# Patient Record
Sex: Female | Born: 1955 | Race: Black or African American | Hispanic: No | Marital: Single | Smoking: Current some day smoker
Health system: Southern US, Community
[De-identification: ages and names within clinical notes are randomized; demographics above are authoritative.]

## PROBLEM LIST (undated history)

## (undated) DIAGNOSIS — R51 Headache: Secondary | ICD-10-CM

## (undated) DIAGNOSIS — J9612 Chronic respiratory failure with hypercapnia: Secondary | ICD-10-CM

## (undated) DIAGNOSIS — Z720 Tobacco use: Secondary | ICD-10-CM

## (undated) DIAGNOSIS — Z9981 Dependence on supplemental oxygen: Secondary | ICD-10-CM

## (undated) DIAGNOSIS — F1910 Other psychoactive substance abuse, uncomplicated: Secondary | ICD-10-CM

## (undated) DIAGNOSIS — C3490 Malignant neoplasm of unspecified part of unspecified bronchus or lung: Secondary | ICD-10-CM

## (undated) DIAGNOSIS — F1010 Alcohol abuse, uncomplicated: Secondary | ICD-10-CM

## (undated) DIAGNOSIS — I10 Essential (primary) hypertension: Secondary | ICD-10-CM

## (undated) DIAGNOSIS — Z923 Personal history of irradiation: Secondary | ICD-10-CM

## (undated) DIAGNOSIS — R609 Edema, unspecified: Secondary | ICD-10-CM

## (undated) DIAGNOSIS — J449 Chronic obstructive pulmonary disease, unspecified: Secondary | ICD-10-CM

## (undated) DIAGNOSIS — J45909 Unspecified asthma, uncomplicated: Secondary | ICD-10-CM

## (undated) HISTORY — DX: Chronic obstructive pulmonary disease, unspecified: J44.9

## (undated) HISTORY — PX: NO PAST SURGERIES: SHX2092

## (undated) HISTORY — DX: Personal history of irradiation: Z92.3

## (undated) HISTORY — DX: Tobacco use: Z72.0

## (undated) HISTORY — DX: Essential (primary) hypertension: I10

## (undated) HISTORY — DX: Malignant neoplasm of unspecified part of unspecified bronchus or lung: C34.90

---

## 2000-09-09 ENCOUNTER — Emergency Department (HOSPITAL_COMMUNITY): Admission: EM | Admit: 2000-09-09 | Discharge: 2000-09-09 | Payer: Self-pay | Admitting: Emergency Medicine

## 2000-09-09 ENCOUNTER — Encounter: Payer: Self-pay | Admitting: Emergency Medicine

## 2001-09-15 ENCOUNTER — Inpatient Hospital Stay (HOSPITAL_COMMUNITY): Admission: EM | Admit: 2001-09-15 | Discharge: 2001-09-18 | Payer: Self-pay

## 2001-09-17 ENCOUNTER — Encounter: Payer: Self-pay | Admitting: Internal Medicine

## 2003-10-30 ENCOUNTER — Emergency Department (HOSPITAL_COMMUNITY): Admission: EM | Admit: 2003-10-30 | Discharge: 2003-10-30 | Payer: Self-pay | Admitting: Emergency Medicine

## 2006-04-16 ENCOUNTER — Emergency Department (HOSPITAL_COMMUNITY): Admission: EM | Admit: 2006-04-16 | Discharge: 2006-04-17 | Payer: Self-pay | Admitting: Emergency Medicine

## 2007-07-22 ENCOUNTER — Inpatient Hospital Stay (HOSPITAL_COMMUNITY): Admission: EM | Admit: 2007-07-22 | Discharge: 2007-07-31 | Payer: Self-pay | Admitting: Emergency Medicine

## 2007-07-22 ENCOUNTER — Ambulatory Visit: Payer: Self-pay | Admitting: Pulmonary Disease

## 2007-07-22 ENCOUNTER — Ambulatory Visit: Payer: Self-pay | Admitting: Family Medicine

## 2007-08-08 ENCOUNTER — Telehealth: Payer: Self-pay | Admitting: Family Medicine

## 2007-08-13 ENCOUNTER — Encounter: Payer: Self-pay | Admitting: Family Medicine

## 2007-08-14 ENCOUNTER — Telehealth: Payer: Self-pay | Admitting: *Deleted

## 2007-09-04 ENCOUNTER — Ambulatory Visit: Payer: Self-pay | Admitting: Family Medicine

## 2007-09-04 DIAGNOSIS — F17200 Nicotine dependence, unspecified, uncomplicated: Secondary | ICD-10-CM | POA: Insufficient documentation

## 2007-09-04 DIAGNOSIS — J449 Chronic obstructive pulmonary disease, unspecified: Secondary | ICD-10-CM | POA: Insufficient documentation

## 2007-09-04 DIAGNOSIS — F1020 Alcohol dependence, uncomplicated: Secondary | ICD-10-CM | POA: Insufficient documentation

## 2007-09-04 DIAGNOSIS — Z8709 Personal history of other diseases of the respiratory system: Secondary | ICD-10-CM | POA: Insufficient documentation

## 2007-11-19 ENCOUNTER — Telehealth: Payer: Self-pay | Admitting: Family Medicine

## 2007-11-19 DIAGNOSIS — H547 Unspecified visual loss: Secondary | ICD-10-CM

## 2007-11-23 ENCOUNTER — Telehealth: Payer: Self-pay | Admitting: Family Medicine

## 2007-11-29 ENCOUNTER — Telehealth: Payer: Self-pay | Admitting: Family Medicine

## 2008-02-20 ENCOUNTER — Telehealth: Payer: Self-pay | Admitting: Family Medicine

## 2008-08-28 ENCOUNTER — Emergency Department (HOSPITAL_COMMUNITY): Admission: EM | Admit: 2008-08-28 | Discharge: 2008-08-28 | Payer: Self-pay | Admitting: Emergency Medicine

## 2009-08-24 ENCOUNTER — Inpatient Hospital Stay (HOSPITAL_COMMUNITY): Admission: EM | Admit: 2009-08-24 | Discharge: 2009-08-27 | Payer: Self-pay | Admitting: Emergency Medicine

## 2009-09-02 ENCOUNTER — Telehealth (INDEPENDENT_AMBULATORY_CARE_PROVIDER_SITE_OTHER): Payer: Self-pay | Admitting: *Deleted

## 2009-09-02 ENCOUNTER — Encounter: Payer: Self-pay | Admitting: Family Medicine

## 2009-09-08 ENCOUNTER — Ambulatory Visit: Payer: Self-pay | Admitting: Infectious Diseases

## 2009-09-15 ENCOUNTER — Telehealth: Payer: Self-pay | Admitting: Licensed Clinical Social Worker

## 2009-09-22 ENCOUNTER — Ambulatory Visit: Payer: Self-pay | Admitting: Internal Medicine

## 2009-09-22 DIAGNOSIS — I10 Essential (primary) hypertension: Secondary | ICD-10-CM | POA: Insufficient documentation

## 2009-11-09 ENCOUNTER — Encounter: Payer: Self-pay | Admitting: Internal Medicine

## 2009-11-27 ENCOUNTER — Ambulatory Visit: Payer: Self-pay | Admitting: Internal Medicine

## 2009-11-27 ENCOUNTER — Ambulatory Visit: Payer: Self-pay | Admitting: Cardiovascular Disease

## 2009-11-27 ENCOUNTER — Encounter (INDEPENDENT_AMBULATORY_CARE_PROVIDER_SITE_OTHER): Payer: Self-pay | Admitting: Internal Medicine

## 2009-11-27 ENCOUNTER — Encounter: Payer: Self-pay | Admitting: Internal Medicine

## 2009-11-27 ENCOUNTER — Inpatient Hospital Stay (HOSPITAL_COMMUNITY): Admission: EM | Admit: 2009-11-27 | Discharge: 2009-11-27 | Payer: Self-pay | Admitting: Emergency Medicine

## 2009-11-30 DIAGNOSIS — R079 Chest pain, unspecified: Secondary | ICD-10-CM

## 2009-12-01 ENCOUNTER — Ambulatory Visit: Payer: Self-pay | Admitting: Cardiovascular Disease

## 2009-12-14 ENCOUNTER — Telehealth (INDEPENDENT_AMBULATORY_CARE_PROVIDER_SITE_OTHER): Payer: Self-pay | Admitting: *Deleted

## 2009-12-21 ENCOUNTER — Ambulatory Visit: Payer: Self-pay

## 2009-12-21 ENCOUNTER — Encounter: Payer: Self-pay | Admitting: Cardiovascular Disease

## 2009-12-21 ENCOUNTER — Ambulatory Visit (HOSPITAL_COMMUNITY): Admission: RE | Admit: 2009-12-21 | Discharge: 2009-12-21 | Payer: Self-pay | Admitting: Cardiovascular Disease

## 2009-12-21 ENCOUNTER — Ambulatory Visit: Payer: Self-pay | Admitting: Internal Medicine

## 2009-12-28 ENCOUNTER — Ambulatory Visit: Payer: Self-pay | Admitting: Cardiovascular Disease

## 2009-12-28 ENCOUNTER — Encounter: Payer: Self-pay | Admitting: Cardiovascular Disease

## 2009-12-28 LAB — CONVERTED CEMR LAB
Basophils Absolute: 0.1 10*3/uL (ref 0.0–0.1)
Basophils Relative: 0.7 % (ref 0.0–3.0)
CO2: 34 meq/L — ABNORMAL HIGH (ref 19–32)
Calcium: 8.1 mg/dL — ABNORMAL LOW (ref 8.4–10.5)
Chloride: 89 meq/L — ABNORMAL LOW (ref 96–112)
Eosinophils Absolute: 0 10*3/uL (ref 0.0–0.7)
Glucose, Bld: 152 mg/dL — ABNORMAL HIGH (ref 70–99)
INR: 1.1 — ABNORMAL HIGH (ref 0.8–1.0)
Lymphocytes Relative: 22.4 % (ref 12.0–46.0)
MCV: 97.8 fL (ref 78.0–100.0)
Monocytes Absolute: 0.8 10*3/uL (ref 0.1–1.0)
Monocytes Relative: 11 % (ref 3.0–12.0)
Neutro Abs: 4.8 10*3/uL (ref 1.4–7.7)
Neutrophils Relative %: 65.4 % (ref 43.0–77.0)
Platelets: 147 10*3/uL — ABNORMAL LOW (ref 150.0–400.0)
Sodium: 134 meq/L — ABNORMAL LOW (ref 135–145)

## 2010-01-04 ENCOUNTER — Telehealth (INDEPENDENT_AMBULATORY_CARE_PROVIDER_SITE_OTHER): Payer: Self-pay | Admitting: *Deleted

## 2010-01-07 ENCOUNTER — Ambulatory Visit: Payer: Self-pay | Admitting: Cardiovascular Disease

## 2010-01-07 ENCOUNTER — Inpatient Hospital Stay (HOSPITAL_COMMUNITY)
Admission: RE | Admit: 2010-01-07 | Discharge: 2010-01-08 | Payer: Self-pay | Source: Home / Self Care | Admitting: Cardiovascular Disease

## 2010-02-10 ENCOUNTER — Encounter (INDEPENDENT_AMBULATORY_CARE_PROVIDER_SITE_OTHER): Payer: Self-pay | Admitting: *Deleted

## 2010-02-12 ENCOUNTER — Ambulatory Visit: Payer: Self-pay | Admitting: Infectious Disease

## 2010-02-12 DIAGNOSIS — R7309 Other abnormal glucose: Secondary | ICD-10-CM

## 2010-02-16 LAB — CONVERTED CEMR LAB
ALT: 24 units/L (ref 0–35)
AST: 49 units/L — ABNORMAL HIGH (ref 0–37)
Basophils Relative: 1 % (ref 0–1)
CO2: 32 meq/L (ref 19–32)
Chloride: 99 meq/L (ref 96–112)
Eosinophils Absolute: 0.1 10*3/uL (ref 0.0–0.7)
Glucose, Bld: 87 mg/dL (ref 70–99)
HCT: 36.7 % (ref 36.0–46.0)
Hemoglobin: 12.6 g/dL (ref 12.0–15.0)
LDL Cholesterol: 62 mg/dL (ref 0–99)
Lymphs Abs: 1.9 10*3/uL (ref 0.7–4.0)
Neutro Abs: 4.6 10*3/uL (ref 1.7–7.7)
Platelets: 231 10*3/uL (ref 150–400)
RBC: 3.69 M/uL — ABNORMAL LOW (ref 3.87–5.11)
Sodium: 139 meq/L (ref 135–145)
Total Bilirubin: 0.3 mg/dL (ref 0.3–1.2)
Vitamin B-12: 395 pg/mL (ref 211–911)
WBC: 7.3 10*3/uL (ref 4.0–10.5)

## 2010-02-19 ENCOUNTER — Ambulatory Visit (HOSPITAL_COMMUNITY): Admission: RE | Admit: 2010-02-19 | Discharge: 2010-02-19 | Payer: Self-pay | Admitting: Internal Medicine

## 2010-02-19 ENCOUNTER — Encounter: Payer: Self-pay | Admitting: Internal Medicine

## 2010-03-16 ENCOUNTER — Ambulatory Visit: Payer: Self-pay | Admitting: Internal Medicine

## 2010-03-19 ENCOUNTER — Ambulatory Visit: Payer: Self-pay | Admitting: Internal Medicine

## 2010-03-19 LAB — CONVERTED CEMR LAB
BUN: 16 mg/dL (ref 6–23)
CO2: 24 meq/L (ref 19–32)
Calcium: 8.8 mg/dL (ref 8.4–10.5)
Chloride: 91 meq/L — ABNORMAL LOW (ref 96–112)
Creatinine, Ser: 0.52 mg/dL (ref 0.40–1.20)
Glucose, Bld: 64 mg/dL — ABNORMAL LOW (ref 70–99)
Sodium: 133 meq/L — ABNORMAL LOW (ref 135–145)
Triglycerides: 240 mg/dL — ABNORMAL HIGH (ref <150)

## 2010-04-08 ENCOUNTER — Telehealth: Payer: Self-pay | Admitting: *Deleted

## 2010-04-09 ENCOUNTER — Telehealth: Payer: Self-pay | Admitting: Cardiovascular Disease

## 2010-04-23 ENCOUNTER — Ambulatory Visit: Payer: Self-pay | Admitting: Internal Medicine

## 2010-04-23 ENCOUNTER — Encounter: Payer: Self-pay | Admitting: Internal Medicine

## 2010-04-23 ENCOUNTER — Encounter: Payer: Self-pay | Admitting: Licensed Clinical Social Worker

## 2010-04-23 ENCOUNTER — Ambulatory Visit: Payer: Self-pay | Admitting: Pulmonary Disease

## 2010-04-23 ENCOUNTER — Inpatient Hospital Stay (HOSPITAL_COMMUNITY)
Admission: AD | Admit: 2010-04-23 | Discharge: 2010-04-26 | Payer: Self-pay | Source: Home / Self Care | Admitting: Internal Medicine

## 2010-04-25 ENCOUNTER — Ambulatory Visit: Payer: Self-pay | Admitting: Cardiothoracic Surgery

## 2010-04-26 ENCOUNTER — Encounter: Payer: Self-pay | Admitting: Internal Medicine

## 2010-04-28 ENCOUNTER — Ambulatory Visit (HOSPITAL_COMMUNITY)
Admission: RE | Admit: 2010-04-28 | Discharge: 2010-04-28 | Payer: Self-pay | Source: Home / Self Care | Admitting: Internal Medicine

## 2010-04-30 ENCOUNTER — Ambulatory Visit: Payer: Self-pay | Admitting: Internal Medicine

## 2010-05-04 ENCOUNTER — Telehealth: Payer: Self-pay | Admitting: Licensed Clinical Social Worker

## 2010-05-05 ENCOUNTER — Telehealth (INDEPENDENT_AMBULATORY_CARE_PROVIDER_SITE_OTHER): Payer: Self-pay | Admitting: *Deleted

## 2010-05-11 ENCOUNTER — Ambulatory Visit (HOSPITAL_COMMUNITY): Admission: RE | Admit: 2010-05-11 | Discharge: 2010-05-11 | Payer: Self-pay | Admitting: Internal Medicine

## 2010-05-12 ENCOUNTER — Encounter (INDEPENDENT_AMBULATORY_CARE_PROVIDER_SITE_OTHER): Payer: Self-pay | Admitting: Diagnostic Radiology

## 2010-05-12 ENCOUNTER — Encounter: Payer: Self-pay | Admitting: Internal Medicine

## 2010-05-12 ENCOUNTER — Inpatient Hospital Stay (HOSPITAL_COMMUNITY)
Admission: RE | Admit: 2010-05-12 | Discharge: 2010-05-14 | Payer: Self-pay | Source: Home / Self Care | Admitting: Internal Medicine

## 2010-05-12 ENCOUNTER — Telehealth: Payer: Self-pay | Admitting: Internal Medicine

## 2010-05-12 DIAGNOSIS — C3490 Malignant neoplasm of unspecified part of unspecified bronchus or lung: Secondary | ICD-10-CM

## 2010-05-12 HISTORY — DX: Malignant neoplasm of unspecified part of unspecified bronchus or lung: C34.90

## 2010-05-17 ENCOUNTER — Telehealth: Payer: Self-pay | Admitting: Licensed Clinical Social Worker

## 2010-05-21 ENCOUNTER — Ambulatory Visit: Payer: Self-pay | Admitting: Internal Medicine

## 2010-05-21 DIAGNOSIS — C3490 Malignant neoplasm of unspecified part of unspecified bronchus or lung: Secondary | ICD-10-CM | POA: Insufficient documentation

## 2010-05-27 ENCOUNTER — Encounter: Payer: Self-pay | Admitting: Licensed Clinical Social Worker

## 2010-06-02 ENCOUNTER — Telehealth: Payer: Self-pay | Admitting: Licensed Clinical Social Worker

## 2010-06-08 ENCOUNTER — Ambulatory Visit: Payer: Self-pay | Admitting: Internal Medicine

## 2010-06-14 ENCOUNTER — Ambulatory Visit (HOSPITAL_COMMUNITY): Admission: RE | Admit: 2010-06-14 | Discharge: 2010-06-14 | Payer: Self-pay | Admitting: Thoracic Surgery

## 2010-06-15 ENCOUNTER — Encounter: Admission: RE | Admit: 2010-06-15 | Discharge: 2010-06-15 | Payer: Self-pay | Admitting: Thoracic Surgery

## 2010-06-15 ENCOUNTER — Ambulatory Visit: Payer: Self-pay | Admitting: Psychiatry

## 2010-06-16 ENCOUNTER — Encounter: Payer: Self-pay | Admitting: Internal Medicine

## 2010-06-21 ENCOUNTER — Ambulatory Visit: Payer: Self-pay | Admitting: Thoracic Surgery

## 2010-06-21 ENCOUNTER — Ambulatory Visit (HOSPITAL_COMMUNITY)
Admission: RE | Admit: 2010-06-21 | Discharge: 2010-06-21 | Payer: Self-pay | Source: Home / Self Care | Admitting: Thoracic Surgery

## 2010-06-23 ENCOUNTER — Encounter: Admission: RE | Admit: 2010-06-23 | Discharge: 2010-06-23 | Payer: Self-pay | Admitting: Thoracic Surgery

## 2010-06-23 ENCOUNTER — Ambulatory Visit: Payer: Self-pay | Admitting: Thoracic Surgery

## 2010-06-29 ENCOUNTER — Ambulatory Visit
Admission: RE | Admit: 2010-06-29 | Discharge: 2010-07-28 | Payer: Self-pay | Source: Home / Self Care | Admitting: Radiation Oncology

## 2010-09-30 ENCOUNTER — Telehealth: Payer: Self-pay | Admitting: Internal Medicine

## 2010-11-02 NOTE — Progress Notes (Signed)
  Phone Note Outgoing Call   Call placed by: Patient dgtr Summary of Call: Daughter  Ferdinand Lango at [phone] wants RX's changed to [facility].  Can you call daughter and call in scripts.  thx.  donna t.

## 2010-11-02 NOTE — Assessment & Plan Note (Signed)
Summary: EST-CK/FU/MEDS/CFB   Vital Signs:  Patient profile:   55 year old female Height:      60.75 inches (154.31 cm) Weight:      82.3 pounds (37.41 kg) BMI:     15.74 O2 Sat:      92 % on Room air Temp:     98.7 degrees F (37.06 degrees C) oral Pulse rate:   102 / minute BP sitting:   157 / 90  (right arm)  Vitals Entered By: Stanton Kidney Ditzler RN (Feb 12, 2010 3:31 PM)  O2 Flow:  Room air Is Patient Diabetic? No Pain Assessment Patient in pain? yes     Location: chest Intensity: 10 Onset of pain  long time Nutritional Status BMI of < 19 = underweight Nutritional Status Detail appetite down  Have you ever been in a relationship where you felt threatened, hurt or afraid?denies   Does patient need assistance? Functional Status Self care Ambulation Normal Comments FU and refills on meds - no money. [facility]. Depression - neg.   Primary Care Provider:  Hartley Barefoot MD   History of Present Illness: Karina Weber comes in today for routine follow-up after she was hospitalized with chest pain and dyspnea last montn. She has been found to have severe COPD with extremely limited excercise tolerance. Her major concern today is that she is having difficulty affording her COPD inhalers medications. She also is concerned that she cannot continue to work anymore now that she is on oxygen. She works almost everyday at [employer] currently and feel like this will be very difficult for her to ciontinue to do at this point.  Preventive Screening-Counseling & Management  Alcohol-Tobacco     Smoking Status: current     Smoking Cessation Counseling: yes     Packs/Day: 2 cigs per day     Cigars/week: 2 A WEEK  Caffeine-Diet-Exercise     Does Patient Exercise: yes     Type of exercise: WALKS     Exercise (avg: min/session): 30-60     Times/week: 7  Current Medications (verified): 1)  Albuterol 90 Mcg/act Aers (Albuterol) .Marland Kitchen.. 1-2 Puffs Inhaled As Needed For Wheeze or Sob Disp #1  Mdi 2)  Albuterol Sulfate (2.5 Mg/100ml) 0.083% Nebu (Albuterol Sulfate) .... Use Every 6 Hours As Needed, Dispense 30 Day Supply 3)  Ipratropium Bromide 0.06 % Soln (Ipratropium Bromide) .... Inhale Every 6 Hour As Needed. 4)  Advair Diskus 250-50 Mcg/dose Aepb (Fluticasone-Salmeterol) .... Inhale Twice A Day. Pt. Needs Inst. On How To Use 5)  Hydrochlorothiazide 25 Mg Tabs (Hydrochlorothiazide) .... Take 1 Tablet By Mouth Once A Day 6)  Aspir-Low 81 Mg Tbec (Aspirin) .... Take 1 Tablet By Mouth Once A Day 7)  Metoprolol Tartrate 25 Mg Tabs (Metoprolol Tartrate) .... Take 1 Tablet By Mouth Two Times A Day  Allergies (verified): No Known Drug Allergies  Social History: Packs/Day:  2 cigs per day  Review of Systems      See HPI  Physical Exam  General:  Frail and thin Lungs:  shallow breathing, no rhonchi or  wheezing=normal respiratory effort.   Heart:  normal rate and regular rhythm.   Abdomen:  soft and non-tender.   Msk:  no joint tenderness and no joint swelling.   Skin:  no rashes and no suspicious lesions.   Psych:  normally interactive, good eye contact, and not anxious appearing.     Impression & Recommendations:  Problem # 1:  COPD (ICD-496) Provided her with 2 week  supply of Advair and Albuterol until she can go to the health department and get her medications on Monday AM. Will also place order for PFTs to characterize her Chronic Obstructive Lung Disease. She will liekly be filing a disability claim- she has significant pulm disease which interferes with her ability to work.  The following medications were removed from the medication list:    Atrovent Hfa 17 Mcg/act Aers (Ipratropium bromide hfa) .Marland Kitchen... 1-2 puffs as needed Her updated medication list for this problem includes:    Albuterol 90 Mcg/act Aers (Albuterol) .Marland Kitchen... 1-2 puffs inhaled as needed for wheeze or sob disp #1 mdi    Albuterol Sulfate (2.5 Mg/52ml) 0.083% Nebu (Albuterol sulfate) ..... Use every 6 hours as  needed, dispense 30 day supply    Advair Diskus 250-50 Mcg/dose Aepb (Fluticasone-salmeterol) ..... Inhale twice a day. pt. needs inst. on how to use  Orders: T-BNP  (B Natriuretic Peptide) ([id number]) PFT Baseline-Pre/Post Bronchodiolator (PFT Baseline-Pre/Pos)  Problem # 2:  ESSENTIAL HYPERTENSION (ICD-401.9) Patient has not taken her medications today because she cannot afford them until Monday.  Her updated medication list for this problem includes:    Hydrochlorothiazide 25 Mg Tabs (Hydrochlorothiazide) .Marland Kitchen... Take 1 tablet by mouth once a day    Metoprolol Tartrate 25 Mg Tabs (Metoprolol tartrate) .Marland Kitchen... Take 1 tablet by mouth two times a day  Orders: T-Comprehensive Metabolic Panel [id number]) T-CBC w/Diff ([id number]) T-Lipid Profile ([id number])  BP today: 157/90 Prior BP: 126/68 (12/28/2009)  Labs Reviewed: K+: 3.5 (12/28/2009) Creat: : 0.6 (12/28/2009)     Problem # 3:  ALCOHOLISM (ICD-303.90) She denies an active drinking problem- but she appears very frail and underweight. I will chack her LFTs, mg, and B12 levels today. Orders: T-Vitamin B12 [id number]) T-Magnesium ([id number])  Problem # 4:  Preventive Health Care (ICD-V70.0) She is due for lipd panel and will also check an A1C ?hx of hyperglycemia.  Complete Medication List: 1)  Albuterol 90 Mcg/act Aers (Albuterol) .Marland Kitchen.. 1-2 puffs inhaled as needed for wheeze or sob disp #1 mdi 2)  Albuterol Sulfate (2.5 Mg/63ml) 0.083% Nebu (Albuterol sulfate) .... Use every 6 hours as needed, dispense 30 day supply 3)  Ipratropium Bromide 0.06 % Soln (Ipratropium bromide) .... Inhale every 6 hour as needed. 4)  Advair Diskus 250-50 Mcg/dose Aepb (Fluticasone-salmeterol) .... Inhale twice a day. pt. needs inst. on how to use 5)  Hydrochlorothiazide 25 Mg Tabs (Hydrochlorothiazide) .... Take 1 tablet by mouth once a day 6)  Aspir-low 81 Mg Tbec (Aspirin) .... Take 1 tablet by mouth once a day 7)  Metoprolol Tartrate 25  Mg Tabs (Metoprolol tartrate) .... Take 1 tablet by mouth two times a day  Other Orders: T-Hgb A1C (in-house) ([id number])  Patient Instructions: 1)  Please get medicine at [facility] as soon as possible. 2)  Please get your Lung Function Tests done-do not miss your appointment. 3)  Return to clinic in 2 weeks. Prescriptions: METOPROLOL TARTRATE 25 MG TABS (METOPROLOL TARTRATE) Take 1 tablet by mouth two times a day  #62 x 6   Entered and Authorized by:   Julaine Fusi  DO   Signed by:   Julaine Fusi  DO on 02/12/2010   Method used:   Faxed to ...       [facility] (retail)       [address] [place], [place]  [address]       Ph: [phone]  Fax: [phone]   RxID:   [id number] HYDROCHLOROTHIAZIDE 25 MG TABS (HYDROCHLOROTHIAZIDE) Take 1 tablet by mouth once a day  #31 x 6   Entered and Authorized by:   Julaine Fusi  DO   Signed by:   Julaine Fusi  DO on 02/12/2010   Method used:   Faxed to ...       [facility] Department (retail)       [address] [place], [place]  [address]       Ph: [phone]       Fax: [phone]   RxID:   [id number] ADVAIR DISKUS 250-50 MCG/DOSE AEPB (FLUTICASONE-SALMETEROL) Inhale twice a day. Pt. needs inst. on how to use  #1 x 11   Entered and Authorized by:   Julaine Fusi  DO   Signed by:   Julaine Fusi  DO on 02/12/2010   Method used:   Faxed to ...       [facility] Department (retail)       [address] [place], [place]  [address]       Ph: [phone]       Fax: [phone]   RxID:   [id number] ALBUTEROL 90 MCG/ACT AERS (ALBUTEROL) 1-2 puffs inhaled as needed for wheeze or sob Disp #1 MDI  #1 x 6   Entered and Authorized by:   Julaine Fusi  DO   Signed by:   Julaine Fusi  DO on 02/12/2010   Method used:   Faxed to ...       [facility] Department (retail)       [address] [place], [place]   [address]       Ph: [phone]       Fax: [phone]   RxID:   [id number]   Prevention & Chronic Care Immunizations   Influenza vaccine: Not documented    Tetanus booster: Not documented    Pneumococcal vaccine: Not documented  Colorectal Screening   Hemoccult: Not documented    Colonoscopy: Not documented  Other Screening   Pap smear: Not documented    Mammogram: Not documented   Smoking status: current  (02/12/2010)   Smoking cessation counseling: yes  (02/12/2010)  Lipids   Total Cholesterol: Not documented   LDL: Not documented   LDL Direct: Not documented   HDL: Not documented   Triglycerides: Not documented  Hypertension   Last Blood Pressure: 157 / 90  (02/12/2010)   Serum creatinine: 0.6  (12/28/2009)   Serum potassium 3.5  (12/28/2009) CMP ordered   Self-Management Support :   Personal Goals (by the next clinic visit) :      Personal blood pressure goal: 140/90  (02/12/2010)   Patient will work on the following items until the next clinic visit to reach self-care goals:     Medications and monitoring: take my medicines every day, bring all of my medications to every visit  (02/12/2010)     Eating: drink diet soda or water instead of juice or soda, eat more vegetables, use fresh or frozen vegetables, eat fruit for snacks and desserts, limit or avoid alcohol  (02/12/2010)     Activity: take a 30 minute walk every day  (02/12/2010)    Hypertension self-management support: Written self-care plan, Education handout, Resources for patients handout  (02/12/2010)   Hypertension self-care plan printed.   Hypertension education handout printed  Resource handout printed.  Process Orders Check Orders Results:     [facility]: ABN not required for this insurance Tests Sent for requisitioning (Feb 16, 2010 2:15 PM):     02/12/2010: [facility] -- T-Comprehensive Metabolic Panel [[id number]] (signed)     02/12/2010:  [facility] -- T-CBC w/Diff [[id number]] (signed)     02/12/2010: [facility] -- T-Lipid Profile [phone] (signed)     02/12/2010: [facility] -- T-BNP  (B Natriuretic Peptide) [phone] [id number] (signed)     02/12/2010: [facility] -- T-Vitamin B12 [phone] (signed)     02/12/2010: [facility] -- T-Magnesium [[id number]] (signed)    Laboratory Results   Blood Tests   Date/Time Received: Feb 12, 2010 4:18 PM Date/Time Reported: Alric Quan  Feb 12, 2010 4:18 PM   HGBA1C: 5.2%   (Normal Range: Non-Diabetic - 3-6%   Control Diabetic - 6-8%)

## 2010-11-02 NOTE — Progress Notes (Signed)
Summary: refill/gg  Phone Note Refill Request  on May 05, 2010 2:33 PM  Refills Requested: Medication #1:  ALBUTEROL 90 MCG/ACT AERS 1-2 puffs inhaled as needed for wheeze or sob Disp #1 MDI  Medication #2:  ADVAIR DISKUS 250-50 MCG/DOSE AEPB Inhale twice a day. Pt. needs inst. on how to use  Medication #3:  IPRATROPIUM BROMIDE 0.06 % SOLN Inhale every 6 hour as needed.  Medication #4:  HYDROCHLOROTHIAZIDE 25 MG TABS Take 1 tablet by mouth once a day  Method Requested: Electronic Initial call taken by: Merrie Roof RN,  May 05, 2010 2:35 PM  Follow-up for Phone Call        I reviewed meds and she should have refills on ALL meds at the drugstore. Follow-up by: Blanch Media MD,  May 05, 2010 4:44 PM  Additional Follow-up for Phone Call Additional follow up Details #1::        I am not sure if pt changed pharmacy's. Unable to get in touch with her. Message left for pt to call me. Additional Follow-up by: Merrie Roof RN,  May 05, 2010 4:57 PM     Appended Document: refill/gg Pt meds all [facility] [address] at pt's request.

## 2010-11-02 NOTE — Discharge Summary (Signed)
Summary: Hospital Discharge Update    Hospital Discharge Update:  Date of Admission: 04/23/2010 Date of Discharge: 04/26/2010  Brief Summary:  Patient was admitted for COPD exacerbation with an O2 sat of 85% on RA and suicidal ideation with a plan of taking a whole bottle of pills.  Patient O2 sat improves after starting 2L of [employer] without any SOB.  During the hospitalization, patient denies SI/HI.  Chest-Xray shows a developing lung mass that is questionable for lung carcinoma.  CT scan shows 2.8 cm right upper lobe lung mass worrisome for neoplasm normal appearance of the right kidney,  right renal mass is not excluded. CCM was consulted and recommended IR because broncoscopy cannot be performed given the location of the lung mass.  Both IR and CT surgery were consulted and they recommend PET scan and PFT (had PFT 01/2010) as outpatient.  IR may perform lung biopsy if positive PET scan.   Other follow-up issues:  PET scan results.  If positive, will need to schedule IR for lung biopsy.   Disability paper-works  Problem list changes:  Added new problem of MASS, LUNG (ICD-786.6)  Medication list changes:  Removed medication of SPIRIVA HANDIHALER 18 MCG CAPS (TIOTROPIUM BROMIDE MONOHYDRATE) inhale 1 crushed capsule daily Removed medication of ALBUTEROL SULFATE (2.5 MG/3ML) 0.083% NEBU (ALBUTEROL SULFATE) use every 6 hours as needed, dispense 30 day supply Added new medication of TGT NICOTINE STEP ONE 21 MG/24HR PT24 (NICOTINE) One patch daily for 6 weeks - Signed Added new medication of TGT NICOTINE STEP TWO 14 MG/24HR PT24 (NICOTINE) One patch daily for 2 weeks after completion of 6 weeks 21 mg patch - Signed Added new medication of TGT NICOTINE STEP THREE 7 MG/24HR PT24 (NICOTINE) One patch daily for 2 weeks after completion of 2 weeks 14 mg patch - Signed Added new medication of CELEXA 20 MG TABS (CITALOPRAM HYDROBROMIDE) once daily - Signed Rx of TGT NICOTINE STEP ONE 21 MG/24HR PT24  (NICOTINE) One patch daily for 6 weeks;  #42 x 0;  Signed;  Entered by: Jackson Latino MD;  Authorized by: Rosana Berger MD;  Method used: Electronically to [facility] [address], [place], [place], [place]  [address], Ph: [phone], Fax: [phone] Rx of TGT NICOTINE STEP TWO 14 MG/24HR PT24 (NICOTINE) One patch daily for 2 weeks after completion of 6 weeks 21 mg patch;  #14 x 0;  Signed;  Entered by: Jackson Latino MD;  Authorized by: Rosana Berger MD;  Method used: Electronically to [facility] [address], [place], [place], [place]  [address], Ph: [phone], Fax: [phone] Rx of TGT NICOTINE STEP THREE 7 MG/24HR PT24 (NICOTINE) One patch daily for 2 weeks after completion of 2 weeks 14 mg patch;  #14 x 0;  Signed;  Entered by: Jackson Latino MD;  Authorized by: Rosana Berger MD;  Method used: Electronically to [facility] [address] [place], [place], [place]  [address], Ph: [phone], Fax: [phone] Rx of CELEXA 20 MG TABS (CITALOPRAM HYDROBROMIDE) once daily;  #30 x 2;  Signed;  Entered by: Rosana Berger MD;  Authorized by: Rosana Berger MD;  Method used: Electronically to [facility] [address] [place], [place], [place]  [address], Ph: [phone], Fax: [phone]  The medication, problem, and allergy lists have been updated.  Please see the dictated discharge summary for details.  Discharge medications:  ALBUTEROL 90 MCG/ACT AERS (  ALBUTEROL) 1-2 puffs inhaled as needed for wheeze or sob Disp #1 MDI IPRATROPIUM BROMIDE 0.06 % SOLN (IPRATROPIUM BROMIDE) Inhale every 6 hour as needed. ADVAIR DISKUS 250-50 MCG/DOSE AEPB (FLUTICASONE-SALMETEROL) Inhale twice a day. Pt. needs inst. on how to use HYDROCHLOROTHIAZIDE 25 MG TABS (HYDROCHLOROTHIAZIDE) Take 1 tablet by mouth once a day ASPIR-LOW 81 MG TBEC (ASPIRIN) Take 1 tablet by mouth once a day METOPROLOL TARTRATE 25 MG TABS (METOPROLOL TARTRATE)  Take 1 tablet by mouth two times a day TGT NICOTINE STEP ONE 21 MG/24HR PT24 (NICOTINE) One patch daily for 6 weeks TGT NICOTINE STEP TWO 14 MG/24HR PT24 (NICOTINE) One patch daily for 2 weeks after completion of 6 weeks 21 mg patch TGT NICOTINE STEP THREE 7 MG/24HR PT24 (NICOTINE) One patch daily for 2 weeks after completion of 2 weeks 14 mg patch CELEXA 20 MG TABS (CITALOPRAM HYDROBROMIDE) once daily  Other patient instructions:  1. PET Scan at [facility] on 04/28/2010 at 11:45AM; however, need to arrive at [facility] at 10AM.  You cannot have anything to eat or drink after midnight 2. Follow-up with Dr. Tonny Branch on 04/30/2010 at 1:30PM for PET scan results    Note: Hospital Discharge Medications & Other Instructions handout was printed, one copy for patient and a second copy to be placed in hospital chart.

## 2010-11-02 NOTE — Assessment & Plan Note (Signed)
Summary: hfu-per dr ho/cfb   Vital Signs:  Patient profile:   55 year old female Height:      60.75 inches (154.31 cm) Weight:      81.0 pounds (36.82 kg) BMI:     15.49 O2 Sat:      97 % on Room air Temp:     99.2 degrees F (37.33 degrees C) oral Pulse rate:   81 / minute BP sitting:   162 / 84  (right arm)  Vitals Entered By: Stanton Kidney Ditzler RN (April 30, 2010 1:50 PM)  O2 Flow:  Room air Is Patient Diabetic? No Pain Assessment Patient in pain? no      Nutritional Status BMI of < 19 = underweight Nutritional Status Detail aqppetite good  Have you ever been in a relationship where you felt threatened, hurt or afraid?denies   Does patient need assistance? Functional Status Self care Ambulation Normal Comments Daughter-in-law with pt. HFU - doing well. Discuss PET results.   Primary Care Provider:  Leodis Sias MD   History of Present Illness: Patient is a 55 year old female who presents today for a HFU.  She was hospitalized involuntarily in 7/22 because of suicidal ideation and COPD exacerbation.  She was discharged on 7/25 and has been doing well since.  She is only taking her advair and albuterol inhalers since discharge as they are all she can afford at this time.  During hospitalization she was found to have a right lung mass on chest CT that is concerning for malignancy.  Pulmonology, Interventional, and CT surgery were consulted and suggested biopsy after a PET scan and baseline PFTs.   PET scan was completed and showed increased activity in the right lung mass as well as several microfoci in the right middle lobe that had increased activity.  Lymph nodes were negative.  She has denied hemoptysis, increased cough, or chest pain.  She is here today with her daughter-in-law to discuss the PET results and what the next step is.  During her hospitalization social work helped her initiate the paperwork for her medicaid application and she would like to make an  appointment with Nilda Riggs to help follow through.  Depression History:      The patient denies a depressed mood most of the day and a diminished interest in her usual daily activities.  The patient denies insomnia, hypersomnia, psychomotor agitation, psychomotor retardation, fatigue (loss of energy), feelings of worthlessness (guilt), impaired concentration (indecisiveness), and recurrent thoughts of death or suicide.        The patient denies that she feels like life is not worth living, denies that she wishes that she were dead, and denies that she has thought about ending her life.         Preventive Screening-Counseling & Management  Alcohol-Tobacco     Smoking Status: current     Smoking Cessation Counseling: yes     Packs/Day: 2-3 cigs per day     Cigars/week: 2 A WEEK  Caffeine-Diet-Exercise     Does Patient Exercise: yes     Type of exercise: WALKS     Exercise (avg: min/session): 30-60     Times/week: 7  Current Medications (verified): 1)  Albuterol 90 Mcg/act Aers (Albuterol) .Marland Kitchen.. 1-2 Puffs Inhaled As Needed For Wheeze or Sob Disp #1 Mdi 2)  Ipratropium Bromide 0.06 % Soln (Ipratropium Bromide) .... Inhale Every 6 Hour As Needed. 3)  Advair Diskus 250-50 Mcg/dose Aepb (Fluticasone-Salmeterol) .... Inhale Twice A Day. Pt.  Needs Inst. On How To Use 4)  Hydrochlorothiazide 25 Mg Tabs (Hydrochlorothiazide) .... Take 1 Tablet By Mouth Once A Day 5)  Aspir-Low 81 Mg Tbec (Aspirin) .... Take 1 Tablet By Mouth Once A Day 6)  Metoprolol Tartrate 25 Mg Tabs (Metoprolol Tartrate) .... Take 1 Tablet By Mouth Two Times A Day 7)  Tgt Nicotine Step One 21 Mg/24hr Pt24 (Nicotine) .... One Patch Daily For 6 Weeks 8)  Tgt Nicotine Step Two 14 Mg/24hr Pt24 (Nicotine) .... One Patch Daily For 2 Weeks After Completion of 6 Weeks 21 Mg Patch 9)  Tgt Nicotine Step Three 7 Mg/24hr Pt24 (Nicotine) .... One Patch Daily For 2 Weeks After Completion of 2 Weeks 14 Mg Patch 10)  Celexa 20 Mg Tabs  (Citalopram Hydrobromide) .... Once Daily  Allergies (verified): No Known Drug Allergies  Past History:  Past medical, surgical, family and social histories (including risk factors) reviewed for relevance to current acute and chronic problems.  Past Medical History: Reviewed history from 12/28/2009 and no changes required. COPD (ICD-496) ESSENTIAL HYPERTENSION (ICD-401.9) ALCOHOLISM (ICD-303.90) TOBACCO USE (ICD-305.1)  Past Surgical History: Reviewed history from 12/01/2009 and no changes required. None  Family History: Reviewed history from 12/01/2009 and no changes required. Mother unknown cause of death Father unknown cause of death No siblings  No CAD  Social History: Reviewed history from 12/01/2009 and no changes required. Lives with long term BF (>30 years) Nathaneil Canary, and son Edison Simon.  Has one other son who does not live with her. Single Works fulltime at [employer] on [employer] 5-6 cigarettes/day, Has smoked 1ppd for 40 years Drinks: Former abuse, now stopped since 2/11. Former MJ use, currently deniesPacks/Day:  2-3 cigs per day  Review of Systems  The patient denies anorexia, fever, weight loss, weight gain, vision loss, decreased hearing, hoarseness, chest pain, syncope, dyspnea on exertion, peripheral edema, prolonged cough, headaches, hemoptysis, abdominal pain, melena, hematochezia, severe indigestion/heartburn, hematuria, incontinence, genital sores, muscle weakness, suspicious skin lesions, transient blindness, difficulty walking, depression, unusual weight change, abnormal bleeding, enlarged lymph nodes, angioedema, and breast masses.    Physical Exam  Additional Exam:  General: Thin, frail elderly appearing female in no acute distress.  Vital signs reviewed.  Head: Atraumatic, normocephalic with no signs of trauma  Eyes: PERRLA, EOM intact  Ears: TM intact  Nose: Nares patent, mucosa is pink and moist, no polyps noted  Mouth: Mucosa  is pink and moist, dention,   Neck: Supple, full ROM, no thyromegaly or masses noted  Resp: decreased air entry bilaterally but clear to ascultation bilaterally, no wheezes, rales, or rhonchi noted  CV: Regular rate and rhythm with no murmurs, rubs, or gallops noted  Abdomen: Soft, non-tender, non-distended with normal bowel sounds  Musculoskelatal: ROM full with intact strength, no pain to palpation  Neurologic: Alert and oriented x3, Cranial nerves II-XII grossly intact, DTR normal and symmetric  Pulses: Radial, brachial, carotid, femoral, dorsal pedis, and posterior tibial pulses equal and symmetric     Impression & Recommendations:  Problem # 1:  MASS, LUNG (ICD-786.6) We discussed the results of the PET scan in detail and that this was concerning for malignancy.  She is scheduled for biopsy on 8/10 and I will inform her of the results as soon as they are available.  Once we have the pathology report we will make the appropriate referral for Oncology.  Usual generic treatment of cancer was discussed including surgery, radiation, and chemotherapy.  I informed her that the  actual treatment recommendations will be based on what the biopsy shows.  She will have a baseline PFT done and we will see her back in 1 month to follow up on the progress.  Orders: Social Work Referral (Social ) PFT Baseline-Pre/Post Bronchodiolator (PFT Baseline-Pre/Pos)  Problem # 2:  COPD (ICD-496) She seems to be doing okay from this standpoint for now.  She was given a sample of Advair and Ventolin to hold her over until they can get [place] the county pharmacy to fill her prescriptions.  Her updated medication list for this problem includes:    Albuterol 90 Mcg/act Aers (Albuterol) .Marland Kitchen... 1-2 puffs inhaled as needed for wheeze or sob disp #1 mdi    Ipratropium Bromide 0.02 % Soln (Ipratropium bromide) ..... One nebulizer (2.5 ml) every 6 hours as needed for shortness of breath.    Advair Diskus 250-50 Mcg/dose  Aepb (Fluticasone-salmeterol) ..... Inhale twice a day. pt. needs inst. on how to use  Orders: Social Work Referral (Social ) PFT Baseline-Pre/Post Bronchodiolator (PFT Baseline-Pre/Pos)  Pulmonary Functions Reviewed: O2 sat: 97 (04/30/2010)     Problem # 3:  ESSENTIAL HYPERTENSION (ICD-401.9)  She has not been taking her medications lately.  Blood pressure is elevated today.  She is encouraged to fill her prescriptions and take them.  We will recheck her blood pressure at her follow up. Her updated medication list for this problem includes:    Hydrochlorothiazide 25 Mg Tabs (Hydrochlorothiazide) .Marland Kitchen... Take 1 tablet by mouth once a day    Metoprolol Tartrate 25 Mg Tabs (Metoprolol tartrate) .Marland Kitchen... Take 1 tablet by mouth two times a day  Orders: Social Work Referral (Social )  BP today: 162/84 Prior BP: 164/87 (04/23/2010)  Labs Reviewed: K+: 3.7 (03/19/2010) Creat: : 0.52 (03/19/2010)   Chol: 237 (03/19/2010)   HDL: 157 (03/19/2010)   LDL: 32 (03/19/2010)   TG: 240 (03/19/2010)  Problem # 4:  SUICIDAL IDEATION (ICD-V62.84) She denies any SI or HI since admission.  She is in good spirits today.  She has prescription for Celexa and we discussed the proper trial length of 6-8 weeks before we see the full effect of the drug.  Problem # 5:  TOBACCO USE (ICD-305.1) Encouraged her to stop completely as this will help her breathing and help her afford her medications. Her updated medication list for this problem includes:    Tgt Nicotine Step One 21 Mg/24hr Pt24 (Nicotine) ..... One patch daily for 6 weeks    Tgt Nicotine Step Two 14 Mg/24hr Pt24 (Nicotine) ..... One patch daily for 2 weeks after completion of 6 weeks 21 mg patch    Tgt Nicotine Step Three 7 Mg/24hr Pt24 (Nicotine) ..... One patch daily for 2 weeks after completion of 2 weeks 14 mg patch  Complete Medication List: 1)  Albuterol 90 Mcg/act Aers (Albuterol) .Marland Kitchen.. 1-2 puffs inhaled as needed for wheeze or sob disp #1 mdi 2)   Ipratropium Bromide 0.02 % Soln (Ipratropium bromide) .... One nebulizer (2.5 ml) every 6 hours as needed for shortness of breath. 3)  Advair Diskus 250-50 Mcg/dose Aepb (Fluticasone-salmeterol) .... Inhale twice a day. pt. needs inst. on how to use 4)  Hydrochlorothiazide 25 Mg Tabs (Hydrochlorothiazide) .... Take 1 tablet by mouth once a day 5)  Aspir-low 81 Mg Tbec (Aspirin) .... Take 1 tablet by mouth once a day 6)  Metoprolol Tartrate 25 Mg Tabs (Metoprolol tartrate) .... Take 1 tablet by mouth two times a day 7)  Tgt Nicotine Step  One 21 Mg/24hr Pt24 (Nicotine) .... One patch daily for 6 weeks 8)  Tgt Nicotine Step Two 14 Mg/24hr Pt24 (Nicotine) .... One patch daily for 2 weeks after completion of 6 weeks 21 mg patch 9)  Tgt Nicotine Step Three 7 Mg/24hr Pt24 (Nicotine) .... One patch daily for 2 weeks after completion of 2 weeks 14 mg patch 10)  Celexa 20 Mg Tabs (Citalopram hydrobromide) .... Once daily  Patient Instructions: 1)  We will schedule your breathing tests. 2)  Keep your appointment for the biopsy on 05/12/10 at [facility] 3)  Continue your medications.  You can get them from the county pharmacy which is the least expensive options 4)  Schedule an appointment with Dorothe Pea for help with your Medicaid application. 5)  Please schedule a follow-up appointment in 1 month. Prescriptions: IPRATROPIUM BROMIDE 0.02 % SOLN (IPRATROPIUM BROMIDE) One nebulizer (2.5 mL) every 6 hours as needed for shortness of breath.  #62.5 mL x 0   Entered and Authorized by:   Blanch Media MD   Signed by:   Blanch Media MD on 05/06/2010   Method used:   Telephoned to ...       [facility] Department (retail)       [address] [place], [place]  [address]       Ph: [phone]       Fax: [phone]   RxID:   [id number] IPRATROPIUM BROMIDE 0.06 % SOLN (IPRATROPIUM BROMIDE) Inhale every 6 hour as needed.  #30 x 2   Entered and Authorized by:   Leodis Sias MD   Signed by:   Leodis Sias MD on 04/30/2010   Method used:   Faxed to ...       [facility] Department (retail)       [address] [place], [place]  [address]       Ph: [phone]       Fax: [phone]   RxID:   [id number]    Prevention & Chronic Care Immunizations   Influenza vaccine: Not documented   Influenza vaccine deferral: Not available  (04/30/2010)    Tetanus booster: Not documented   Td booster deferral: Deferred  (04/23/2010)    Pneumococcal vaccine: Not documented  Colorectal Screening   Hemoccult: Not documented    Colonoscopy: Not documented   Colonoscopy action/deferral: Deferred  (04/30/2010)  Other Screening   Pap smear: Not documented   Pap smear action/deferral: Deferred  (04/30/2010)    Mammogram: Not documented   Mammogram action/deferral: Deferred  (04/30/2010)   Smoking status: current  (04/30/2010)   Smoking cessation counseling: yes  (04/30/2010)  Lipids   Total Cholesterol: 237  (03/19/2010)   LDL: 32  (03/19/2010)   LDL Direct: Not documented   HDL: 157  (03/19/2010)   Triglycerides: 240  (03/19/2010)  Hypertension   Last Blood Pressure: 162 / 84  (04/30/2010)   Serum creatinine: 0.52  (03/19/2010)   Serum potassium 3.7  (03/19/2010)    Hypertension flowsheet reviewed?: Yes   Progress toward BP goal: Unchanged  Self-Management Support :   Personal Goals (by the next clinic visit) :      Personal blood pressure goal: 140/90  (02/12/2010)   Patient will work on the following items until the next clinic visit to reach self-care goals:     Medications and monitoring: take my medicines every day, bring all of my medications to every  visit, weigh myself weekly  (04/30/2010)     Eating: drink diet soda or water instead of juice or soda, eat more vegetables, use fresh or frozen vegetables, eat foods that are low in salt, eat baked foods instead of fried foods, eat fruit for snacks and desserts   (04/30/2010)     Activity: take a 30 minute walk every day  (04/30/2010)     Other: not taking meds, not eating, not exercising- ran out of money  (04/23/2010)    Hypertension self-management support: Written self-care plan, Education handout, Resources for patients handout  (04/30/2010)   Hypertension self-care plan printed.   Hypertension education handout printed      Resource handout printed.

## 2010-11-02 NOTE — Progress Notes (Signed)
  Phone Note Other Incoming   Summary of Call: C/B to Ms. Blackmon at [phone] to let her know that medical was faxed to Ms. Emerson on 8/23 at fax  [phone].  LEft message.

## 2010-11-02 NOTE — Miscellaneous (Signed)
Summary: [facility] Discharge  Date of admission: 11/27/2009  Date of discharge:11/27/2009  Brief reason for admission/active problems: Patient is a 55 year old woman with a PMH of HTN, tobacco abuse and COPD on home oxygen who presents to the emergency department for elevated blood pressure and chest pain. Her EKG shows TWI in V1-4 which is new During the hospitalization, 2D-ECHO and cardiac enzymes are all unremarkable and she is chest pain free. She will be discharged to home.   Followup needed: She will have an appointment with Cardiologist Dr. Clifton James on 12/01/2009 at 10:30 AM She will have an appointment with Dr. Cena Benton on 12/09/2009 at 1:30 PM and check her symptoms and BP.  The medication and problem lists have been updated.  Please see the dictated discharge summary for details.    Updated Prior Medication List: ALBUTEROL 90 MCG/ACT AERS (ALBUTEROL) 1-2 puffs inhaled as needed for wheeze or sob Disp #1 MDI ALBUTEROL SULFATE (2.5 MG/3ML) 0.083% NEBU (ALBUTEROL SULFATE) use every 6 hours as needed, dispense 30 day supply IPRATROPIUM BROMIDE 0.06 % SOLN (IPRATROPIUM BROMIDE) Inhale every 6 hour as needed. ADVAIR DISKUS 250-50 MCG/DOSE AEPB (FLUTICASONE-SALMETEROL) Inhale twice a day. HYDROCHLOROTHIAZIDE 25 MG TABS (HYDROCHLOROTHIAZIDE) Take 1 tablet by mouth once a day ASPIR-LOW 81 MG TBEC (ASPIRIN) Take 1 tablet by mouth once a day METOPROLOL TARTRATE 25 MG TABS (METOPROLOL TARTRATE) Take 1 tablet by mouth two times a day  Current Allergies: No known allergies     Prescriptions: METOPROLOL TARTRATE 25 MG TABS (METOPROLOL TARTRATE) Take 1 tablet by mouth two times a day  #62 x 3   Entered and Authorized by:   Jackson Latino MD   Signed by:   Jackson Latino MD on 11/28/2009   Method used:   Electronically to        [facility] [id number] (retail)       [address] [place], [place]  [address]       Ph: [phone]  Fax: [phone]   RxID:   [id number] ASPIR-LOW 81 MG TBEC (ASPIRIN) Take 1 tablet by mouth once a day  #31 x 6   Entered and Authorized by:   Jackson Latino MD   Signed by:   Jackson Latino MD on 11/28/2009   Method used:   Electronically to        [facility] [address] (retail)       [address] [place], [place]  [address]       Ph: [phone]       Fax: [phone]   RxID:   [id number] HYDROCHLOROTHIAZIDE 25 MG TABS (HYDROCHLOROTHIAZIDE) Take 1 tablet by mouth once a day  #31 x 6   Entered and Authorized by:   Jackson Latino MD   Signed by:   Jackson Latino MD on 11/28/2009   Method used:   Electronically to        [facility] [id number] (retail)       [address] [place], [place]  [address]       Ph: [phone]       Fax: [phone]   RxID:   [id number]    Patient Instructions: 1)  You will have an appointment  with Cardiologist Dr. Clifton James on 12/01/2009 at 10:30 AM. 2)  You will have an appointment with Dr. Cena Benton on 12/09/2009 at 1:30 PM  3)  Please come to the appointments and these are very important to your health. 4)  Tobacco is very bad for your health and your loved ones! You Should stop smoking!. 5)  Stop Smoking Tips: Choose a Quit date. Cut down before the Quit date. decide what you will do as a substitute when you feel the urge to smoke(gum,toothpick,exercise).

## 2010-11-02 NOTE — Assessment & Plan Note (Signed)
Summary: 1 MONTH CHECK UP/CFB   Vital Signs:  Patient profile:   55 year old female Height:      60.75 inches (154.31 cm) Weight:      87.1 pounds (39.59 kg) BMI:     16.65 O2 Sat:      94 % on Room air Temp:     98.7 degrees F (37.06 degrees C) oral Pulse rate:   84 / minute BP sitting:   154 / 90  (right arm)  Vitals Entered By: Stanton Kidney Ditzler RN (May 21, 2010 2:30 PM)  O2 Flow:  Room air Is Patient Diabetic? No Pain Assessment Patient in pain? no      Nutritional Status BMI of < 19 = underweight Nutritional Status Detail appetite good  Have you ever been in a relationship where you felt threatened, hurt or afraid?denies   Does patient need assistance? Functional Status Self care Ambulation Normal Comments Daughter in law with pt. FU - doing well. Discuss emphysema.   Primary Care Provider:  Leodis Sias MD   History of Present Illness: Patient is a 55 year old female who presents today to clinic with her daughter-in-law to discuss her PET scan and CT guided biopsy results.  She had some complications from the biopsy.  She has severe underlying COPD and after the biopsy was found to have a pneumothorax.  She had a chest tube placed and was admitted to [facility].  This resolved promptly and she was able to go home.  She has not had any chest pain, shortness of breath, or cough since the procedure.  She looks good today and has actually gained 6 lbs from her last visit.  They have several questions about COPD as well as the results of the biopsy and PET scan and the next step from here.  Other follow up issues include her mood, her disability claim, and getting her medications.  She has been approved for her [employer] card but her case with disability is still pending.  She is getting her inhalers from [facility] but would like her other medications moved over to [facility] to take advantage of the 4 dollar program.    Her mood has been much  better but still finds that she feels a bit worthless and sad at times but has had no recurrence of the suicidal or homicidal thoughts.  She has not had any side effects from the Celexa.    Depression History:      The patient denies a depressed mood most of the day and a diminished interest in her usual daily activities.  Positive alarm features for depression include fatigue (loss of energy) and feelings of worthlessness (guilt).  However, she denies significant weight loss, insomnia, hypersomnia, psychomotor agitation, psychomotor retardation, impaired concentration (indecisiveness), and recurrent thoughts of death or suicide.        The patient denies that she feels like life is not worth living, denies that she wishes that she were dead, and denies that she has thought about ending her life.         Preventive Screening-Counseling & Management  Alcohol-Tobacco     Smoking Status: current     Smoking Cessation Counseling: yes     Packs/Day: 2 cigs per week     Cigars/week: 2 A WEEK  Caffeine-Diet-Exercise     Does Patient Exercise: yes     Type of exercise: WALKS     Exercise (avg: min/session): 30-60  Times/week: 7  Current Medications (verified): 1)  Albuterol 90 Mcg/act Aers (Albuterol) .Marland Kitchen.. 1-2 Puffs Inhaled As Needed For Wheeze or Sob Disp #1 Mdi 2)  Ipratropium Bromide 0.02 % Soln (Ipratropium Bromide) .... One Nebulizer (2.5 Ml) Every 6 Hours As Needed For Shortness of Breath. 3)  Advair Diskus 250-50 Mcg/dose Aepb (Fluticasone-Salmeterol) .... Inhale Twice A Day. Pt. Needs Inst. On How To Use 4)  Hydrochlorothiazide 25 Mg Tabs (Hydrochlorothiazide) .... Take 1 Tablet By Mouth Once A Day 5)  Aspir-Low 81 Mg Tbec (Aspirin) .... Take 1 Tablet By Mouth Once A Day 6)  Metoprolol Tartrate 25 Mg Tabs (Metoprolol Tartrate) .... Take 1 Tablet By Mouth Two Times A Day 7)  Tgt Nicotine Step One 21 Mg/24hr Pt24 (Nicotine) .... One Patch Daily For 6 Weeks 8)  Tgt Nicotine Step Two 14  Mg/24hr Pt24 (Nicotine) .... One Patch Daily For 2 Weeks After Completion of 6 Weeks 21 Mg Patch 9)  Tgt Nicotine Step Three 7 Mg/24hr Pt24 (Nicotine) .... One Patch Daily For 2 Weeks After Completion of 2 Weeks 14 Mg Patch 10)  Celexa 20 Mg Tabs (Citalopram Hydrobromide) .... Once Daily  Allergies (verified): No Known Drug Allergies  Past History:  Past medical, surgical, family and social histories (including risk factors) reviewed for relevance to current acute and chronic problems.  Past Medical History: Reviewed history from 12/28/2009 and no changes required. COPD (ICD-496) ESSENTIAL HYPERTENSION (ICD-401.9) ALCOHOLISM (ICD-303.90) TOBACCO USE (ICD-305.1)  Past Surgical History: Reviewed history from 12/01/2009 and no changes required. None  Family History: Reviewed history from 12/01/2009 and no changes required. Mother unknown cause of death Father unknown cause of death No siblings  No CAD  Social History: Reviewed history from 12/01/2009 and no changes required. Lives with long term BF (>30 years) Nathaneil Canary, and son Edison Simon.  Has one other son who does not live with her. Single Used to work fulltime at [employer] on [address] but is not in the process of getting disability Smokes 2 cigarettes/week and is trying to quit Has smoked 1ppd for 40 years Drinks: Former abuse, now stopped since 2/11. Former MJ use, currently deniesPacks/Day:  2 cigs per week  Review of Systems      See HPI  Physical Exam  Additional Exam:  General: Thin adult female in no acute distress.  Vital signs reviewed.  Head: Atraumatic, normocephalic with no signs of trauma  Eyes: PERRLA, EOM intact  Ears: TM intact  Nose: Nares patent, mucosa is pink and moist, no polyps noted  Mouth: Mucosa is pink and moist, poor dention,   Neck: Supple, full ROM, no thyromegaly or masses noted  Resp: Distant breath sounds bilaterally, no wheezes, rales, or rhonchi noted. CV: Regular rate  and rhythm with no murmurs, rubs, or gallops noted  Abdomen: Soft, non-tender, non-distended with normal bowel sounds  Musculoskelatal: ROM full with intact strength, no pain to palpation  Neurologic: Alert and oriented x3, Cranial nerves II-XII grossly intact, DTR normal and symmetric  Pulses: Radial, brachial, carotid, femoral, dorsal pedis, and posterior tibial pulses equal and symmetric   Psych: some eye contact that is better since last visit, affect is appropriate.   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  Questions were answered and plans were made to refer to Oncology for the next step.  She would not be the best surgical candidate given her severe  underlying lung disease but I will leave that up to the specialist.  General questions about cancer treatment were discussed including chemotherapy, surgery, and radiation therapy.  She has Stage Ia Non-small cell lung cancer at this time given the size of the primary tumor and the fact that it has not invaded any adjacent structures or the lymph nodes.    Orders: Oncology Referral (Oncology)  Problem # 2:  SUICIDAL IDEATION (ICD-V62.84) Her mood is much better today.  She feels that the medication is helping.  We will see her back in the first part of October to see if she is happy with the results of the medication or feels that we should increase the dosage.    Problem # 3:  ESSENTIAL HYPERTENSION (ICD-401.9) Blood pressure is a bit high today but she hasn't taken any of her medications for a few days.  We will reassess at her follow up and adjust her regiment as needed at that time.  Her updated medication list for this problem includes:    Hydrochlorothiazide 25 Mg Tabs (Hydrochlorothiazide) .Marland Kitchen... Take 1 tablet by mouth once a day    Metoprolol Tartrate 25 Mg Tabs (Metoprolol tartrate) .Marland Kitchen... Take 1 tablet by mouth two  times a day  BP today: 154/90 Prior BP: 162/84 (04/30/2010)  Labs Reviewed: K+: 3.7 (03/19/2010) Creat: : 0.52 (03/19/2010)   Chol: 237 (03/19/2010)   HDL: 157 (03/19/2010)   LDL: 32 (03/19/2010)   TG: 240 (03/19/2010)  Problem # 4:  TOBACCO USE (ICD-305.1) She is down to 2 cigarettes per week.  She wants to try the patches to try to decrease the cravings that she is still getting.  We will go with the lowest patch given her current cigarette count.  She is encouraged to continue to make progress and was congratulated on her progress already.  The following medications were removed from the medication list:    Tgt Nicotine Step One 21 Mg/24hr Pt24 (Nicotine) ..... One patch daily for 6 weeks    Tgt Nicotine Step Two 14 Mg/24hr Pt24 (Nicotine) ..... One patch daily for 2 weeks after completion of 6 weeks 21 mg patch Her updated medication list for this problem includes:    Tgt Nicotine Step Three 7 Mg/24hr Pt24 (Nicotine) ..... One patch daily for 2 weeks after completion of 2 weeks 14 mg patch  Complete Medication List: 1)  Albuterol 90 Mcg/act Aers (Albuterol) .Marland Kitchen.. 1-2 puffs inhaled as needed for wheeze or sob disp #1 mdi 2)  Ipratropium Bromide 0.02 % Soln (Ipratropium bromide) .... One nebulizer (2.5 ml) every 6 hours as needed for shortness of breath. 3)  Advair Diskus 250-50 Mcg/dose Aepb (Fluticasone-salmeterol) .... Inhale twice a day. pt. needs inst. on how to use 4)  Hydrochlorothiazide 25 Mg Tabs (Hydrochlorothiazide) .... Take 1 tablet by mouth once a day 5)  Aspir-low 81 Mg Tbec (Aspirin) .... Take 1 tablet by mouth once a day 6)  Metoprolol Tartrate 25 Mg Tabs (Metoprolol tartrate) .... Take 1 tablet by mouth two times a day 7)  Tgt Nicotine Step Three 7 Mg/24hr Pt24 (Nicotine) .... One patch daily for 2 weeks after completion of 2 weeks 14 mg patch 8)  Celexa 20 Mg Tabs (Citalopram hydrobromide) .... Once daily  Patient Instructions: 1)  Take your medications as  prescribed 2)  We will make the referral to the cancer doctor. 3)  Call if you need anything or you experience shortness of breath, or chest pain. 4)  Please schedule a  follow-up appointment in 1 month. Prescriptions: TGT NICOTINE STEP THREE 7 MG/24HR PT24 (NICOTINE) One patch daily for 2 weeks after completion of 2 weeks 14 mg patch  #14 x 0   Entered and Authorized by:   Leodis Sias MD   Signed by:   Leodis Sias MD on 05/21/2010   Method used:   Electronically to        [facility] [phone]* (retail)       [address]       [place], [place]  [address]       Ph: [phone]       Fax: [phone]   RxID:   [id number] CELEXA 20 MG TABS (CITALOPRAM HYDROBROMIDE) once daily  #30 x 3   Entered and Authorized by:   Leodis Sias MD   Signed by:   Leodis Sias MD on 05/21/2010   Method used:   Electronically to        [facility] [phone]* (retail)       [address]       [place], [place]  [address]       Ph: [phone]       Fax: [phone]   RxID:   [id number] METOPROLOL TARTRATE 25 MG TABS (METOPROLOL TARTRATE) Take 1 tablet by mouth two times a day  #62 x 6   Entered and Authorized by:   Leodis Sias MD   Signed by:   Leodis Sias MD on 05/21/2010   Method used:   Electronically to        [facility] [phone]* (retail)       [address]       [place], [place]  [address]       Ph: [phone]       Fax: [phone]   RxID:   [id number] HYDROCHLOROTHIAZIDE 25 MG TABS (HYDROCHLOROTHIAZIDE) Take 1 tablet by mouth once a day  #31 x 6   Entered and Authorized by:   Leodis Sias MD   Signed by:   Leodis Sias MD on 05/21/2010   Method used:   Electronically to        [facility] [phone]* (retail)       [address]       [place], [place]  [address]       Ph: [phone]       Fax: [phone]   RxID:   [id number]    Prevention & Chronic Care Immunizations    Influenza vaccine: Not documented   Influenza vaccine deferral: Not available  (04/30/2010)    Tetanus booster: Not documented   Td booster deferral: Deferred  (04/23/2010)    Pneumococcal vaccine: Not documented  Colorectal Screening   Hemoccult: Not documented    Colonoscopy: Not documented   Colonoscopy action/deferral: Deferred  (04/30/2010)  Other Screening   Pap smear: Not documented   Pap smear action/deferral: Deferred  (04/30/2010)    Mammogram: Not documented   Mammogram action/deferral: Deferred  (04/30/2010)   Smoking status: current  (05/21/2010)   Smoking cessation counseling: yes  (05/21/2010)  Lipids   Total Cholesterol: 237  (03/19/2010)   LDL: 32  (03/19/2010)   LDL Direct: Not documented   HDL: 157  (03/19/2010)   Triglycerides: 240  (03/19/2010)  Hypertension   Last Blood Pressure: 154 / 90  (05/21/2010)   Serum creatinine: 0.52  (03/19/2010)   Serum potassium 3.7  (03/19/2010)    Hypertension flowsheet reviewed?: Yes   Progress toward BP  goal: Unchanged  Self-Management Support :   Personal Goals (by the next clinic visit) :      Personal blood pressure goal: 140/90  (02/12/2010)   Patient will work on the following items until the next clinic visit to reach self-care goals:     Medications and monitoring: take my medicines every day, bring all of my medications to every visit  (05/21/2010)     Eating: eat more vegetables, use fresh or frozen vegetables, eat foods that are low in salt, eat fruit for snacks and desserts, limit or avoid alcohol  (05/21/2010)     Activity: take a 30 minute walk every day  (05/21/2010)     Other: not taking meds, not eating, not exercising- ran out of money  (04/23/2010)    Hypertension self-management support: Written self-care plan, Education handout, Resources for patients handout  (05/21/2010)   Hypertension self-care plan printed.   Hypertension education handout printed      Resource handout  printed.  Appended Document: 1 MONTH CHECK UP/CFB I saw Ms Zaragoza and discussed her with Dr Tonny Branch. I agree with his note. Pt has sig decreased her tobacco use but would like nicotene patch to help completely D/C tobacco. She needs the 7 mg patch. She recently had a bx that was complicated by a PTX at [facility]. Dx [facility]. Pt has heme/onc appt to determine best tx. Pt will F?U with Dr Tonny Branch in one month.

## 2010-11-02 NOTE — Assessment & Plan Note (Signed)
Summary: F/U STRESS ECHO 12-21-09   Visit Type:  Follow-up Primary Provider:  Hartley Barefoot MD  CC:  chest pain.  History of Present Illness: 55 yo AAF with history of HTN, COPD on home oxygen therapy and ongoing tobacco abuse here today for follow up. I saw her as a new consult in the hospital 11/27/09 at [facility]. She was admitted with substernal chest pain that lasted for one hour. Her CK and troponin was normal however her CKMB fraction was elevated. EKG had non-specific changes. Echo with normal wall motion, normal LV systolic function. There was mild AI with grade 1 diastolic dysfunction. She was seen as a new patient in the office four weeks ago and a stress echo was arranged but she could not walk on the treadmill secondary to leg pain. The test was stopped at 50 seconds. She is here for follow up. She continues to have episodes of chest pain 2-3 times per week. She continues to smoke 3 cigarettes per day. She also tells me that she has been out of her inhalers and would like to have me give her samples. She did not follow up with primary care as directed on hospital d/c (Dr. Cena Benton). She has no active primary care physician. She has an orange [place] card. She tells me that she has no money for medications today. She continues to work daily.  Current Medications (verified): 1)  Albuterol 90 Mcg/act Aers (Albuterol) .Marland Kitchen.. 1-2 Puffs Inhaled As Needed For Wheeze or Sob Disp #1 Mdi 2)  Albuterol Sulfate (2.5 Mg/51ml) 0.083% Nebu (Albuterol Sulfate) .... Use Every 6 Hours As Needed, Dispense 30 Day Supply 3)  Ipratropium Bromide 0.06 % Soln (Ipratropium Bromide) .... Inhale Every 6 Hour As Needed. 4)  Advair Diskus 250-50 Mcg/dose Aepb (Fluticasone-Salmeterol) .... Inhale Twice A Day. Pt. Needs Inst. On How To Use 5)  Hydrochlorothiazide 25 Mg Tabs (Hydrochlorothiazide) .... Take 1 Tablet By Mouth Once A Day 6)  Aspir-Low 81 Mg Tbec (Aspirin) .... Take 1 Tablet By Mouth Once A Day 7)   Metoprolol Tartrate 25 Mg Tabs (Metoprolol Tartrate) .... Take 1 Tablet By Mouth Two Times A Day 8)  Atrovent Hfa 17 Mcg/act Aers (Ipratropium Bromide Hfa) .Marland Kitchen.. 1-2 Puffs As Needed  Allergies (verified): No Known Drug Allergies  Past History:  Past Medical History: COPD (ICD-496) ESSENTIAL HYPERTENSION (ICD-401.9) ALCOHOLISM (ICD-303.90) TOBACCO USE (ICD-305.1)  Past Surgical History: Reviewed history from 12/01/2009 and no changes required. None  Family History: Reviewed history from 12/01/2009 and no changes required. Mother unknown cause of death Father unknown cause of death No siblings  No CAD  Social History: Reviewed history from 12/01/2009 and no changes required. Lives with long term BF (>30 years) Nathaneil Canary, and son Edison Simon.  Has one other son who does not live with her. Single Works fulltime at [employer] on [employer] 5-6 cigarettes/day, Has smoked 1ppd for 40 years Drinks: Former abuse, now stopped since 2/11. Former MJ use, currently denies  Review of Systems       The patient complains of chest pain and shortness of breath.  The patient denies fatigue, malaise, fever, weight gain/loss, vision loss, decreased hearing, hoarseness, palpitations, prolonged cough, wheezing, sleep apnea, coughing up blood, abdominal pain, blood in stool, nausea, vomiting, diarrhea, heartburn, incontinence, blood in urine, muscle weakness, joint pain, leg swelling, rash, skin lesions, headache, fainting, dizziness, depression, anxiety, enlarged lymph nodes, easy bruising or bleeding, and environmental allergies.    Vital Signs:  Patient  profile:   55 year old female Height:      60.75 inches Weight:      81 pounds BMI:     15.49 BSA:     1.28 O2 Sat:      94 % Pulse rate:   95 / minute BP sitting:   126 / 68  (left arm)  Vitals Entered By: Laurance Flatten CMA (December 28, 2009 9:48 AM)  Physical Exam  General:  General: Thin, cachectic. NAD HEENT: OP clear,  mucus membranes moist SKIN: warm, dry Neuro: No focal deficits Musculoskeletal: Muscle strength 5/5 all ext Psychiatric: Mood and affect normal Neck: No JVD, no carotid bruits, no thyromegaly, no lymphadenopathy. Lungs:Poor air movement bilaterally, no wheezes, rhonci, crackles CV: RRR no murmurs, gallops rubs Abdomen: soft, NT, ND, BS present Extremities: No edema, pulses 2+.    EKG  Procedure date:  12/28/2009  Findings:      NSR, rate 95 bpm. Normal EKG  Impression & Recommendations:  Problem # 1:  CHEST PAIN-UNSPECIFIED (ICD-786.50) Did not complete stress protocol. She has high probability of obstructive coronary artery disease with recent admission for CP with abnormal enzymes and ongoing tobacco abuse with HTN. Will plan left heart cath to assess coronaries. Will arrange for [facility] Inpatient lab on THursday April 7th, 2011. BMET, CBC and coags today as well as CXR. I have asked her to establish with primary care to have someone manage her other chronic medical issues.   Her updated medication list for this problem includes:    Aspir-low 81 Mg Tbec (Aspirin) .Marland Kitchen... Take 1 tablet by mouth once a day    Metoprolol Tartrate 25 Mg Tabs (Metoprolol tartrate) .Marland Kitchen... Take 1 tablet by mouth two times a day  Orders: EKG w/ Interpretation (93000) TLB-BMP (Basic Metabolic Panel-BMET) (80048-METABOL) TLB-CBC Platelet - w/Differential (85025-CBCD) TLB-PT (Protime) (85610-PTP) TLB-PTT (85730-PTTL) T-2 View CXR (71020TC)  Problem # 2:  ESSENTIAL HYPERTENSION (ICD-401.9) Controlled.   Her updated medication list for this problem includes:    Hydrochlorothiazide 25 Mg Tabs (Hydrochlorothiazide) .Marland Kitchen... Take 1 tablet by mouth once a day    Aspir-low 81 Mg Tbec (Aspirin) .Marland Kitchen... Take 1 tablet by mouth once a day    Metoprolol Tartrate 25 Mg Tabs (Metoprolol tartrate) .Marland Kitchen... Take 1 tablet by mouth two times a day  Patient Instructions: 1)  Your physician recommends that you schedule a  follow-up appointment in: 4 weeks 2)  Your physician recommends that you continue on your current medications as directed. Please refer to the Current Medication list given to you today. 3)  Your physician has requested that you have a cardiac catheterization.  Cardiac catheterization is used to diagnose and/or treat various heart conditions. Doctors may recommend this procedure for a number of different reasons. The most common reason is to evaluate chest pain. Chest pain can be a symptom of coronary artery disease (CAD), and cardiac catheterization can show whether plaque is narrowing or blocking your heart's arteries. This procedure is also used to evaluate the valves, as well as measure the blood flow and oxygen levels in different parts of your heart.  For further information please visit [email or web address]  Please follow instruction sheet, as given.

## 2010-11-02 NOTE — Consult Note (Signed)
Summary: [facility]  [facility]   Imported By: Louretta Parma 07/07/2010 15:33:00  _____________________________________________________________________  External Attachment:    Type:   Image     Comment:   External Document

## 2010-11-02 NOTE — Assessment & Plan Note (Signed)
Summary: nph/pt seen in consult for chest pain/lg   Visit Type:  np6/ Primary Provider:  Hartley Barefoot MD  CC:  chest pain/sob.  History of Present Illness: 55 yo AAF with history of HTN, COPD on home oxygen therapy and ongoing tobacco abuse here today to establish cardiology care. I saw her as a new consult in the hospital 11/27/09 at [facility]. She was admitted with substernal chest pain that lasted for one hour. Her CK and troponin was normal however her CKMB fraction was elevated. EKG had non-specific changes. Echo with normal wall motion, normal LV systolic function. There was mild AI with grade 1 diastolic dysfunction.   She has had no recurrent pain since discharge. She has not yet returned to work. No SOB, palpitations, near syncope or syncope. She has cut back on her cigarettes and has stopped drinking alcohol.   Problems Prior to Update: 1)  Chest Pain-unspecified  (ICD-786.50) 2)  COPD  (ICD-496) 3)  Essential Hypertension  (ICD-401.9) 4)  Visual Impairment  (ICD-369.9) 5)  Alcoholism  (ICD-303.90) 6)  Tobacco Use  (ICD-305.1) 7)  Pneumonia, Hx of  (ICD-V12.60)  Current Medications (verified): 1)  Albuterol 90 Mcg/act Aers (Albuterol) .Marland Kitchen.. 1-2 Puffs Inhaled As Needed For Wheeze or Sob Disp #1 Mdi 2)  Albuterol Sulfate (2.5 Mg/63ml) 0.083% Nebu (Albuterol Sulfate) .... Use Every 6 Hours As Needed, Dispense 30 Day Supply 3)  Ipratropium Bromide 0.06 % Soln (Ipratropium Bromide) .... Inhale Every 6 Hour As Needed. 4)  Advair Diskus 250-50 Mcg/dose Aepb (Fluticasone-Salmeterol) .... Inhale Twice A Day. Pt. Needs Inst. On How To Use 5)  Hydrochlorothiazide 25 Mg Tabs (Hydrochlorothiazide) .... Take 1 Tablet By Mouth Once A Day 6)  Aspir-Low 81 Mg Tbec (Aspirin) .... Take 1 Tablet By Mouth Once A Day 7)  Metoprolol Tartrate 25 Mg Tabs (Metoprolol Tartrate) .... Take 1 Tablet By Mouth Two Times A Day 8)  Atrovent Hfa 17 Mcg/act Aers (Ipratropium Bromide Hfa) .Marland Kitchen.. 1-2 Puffs As  Needed  Allergies (verified): No Known Drug Allergies  Past History:  Past Medical History: COPD (ICD-496) ESSENTIAL HYPERTENSION (ICD-401.9) ALCOHOLISM (ICD-303.90) TOBACCO USE (ICD-305.1)  Past Surgical History: None  Family History: Mother unknown cause of death Father unknown cause of death No siblings  No CAD  Social History: Lives with long term BF (>30 years) Nathaneil Canary, and son Edison Simon.  Has one other son who does not live with her. Single Works fulltime at [employer] on [employer] 5-6 cigarettes/day, Has smoked 1ppd for 40 years Drinks: Former abuse, now stopped since 2/11. Former MJ use, currently denies  Review of Systems  The patient denies fatigue, malaise, fever, weight gain/loss, vision loss, decreased hearing, hoarseness, chest pain, palpitations, shortness of breath, prolonged cough, wheezing, sleep apnea, coughing up blood, abdominal pain, blood in stool, nausea, vomiting, diarrhea, heartburn, incontinence, blood in urine, muscle weakness, joint pain, leg swelling, rash, skin lesions, headache, fainting, dizziness, depression, anxiety, enlarged lymph nodes, easy bruising or bleeding, and environmental allergies.    Vital Signs:  Patient profile:   55 year old female Height:      60.75 inches Weight:      83 pounds BMI:     15.87 Pulse rate:   72 / minute BP sitting:   158 / 86  (left arm) Cuff size:   small  Vitals Entered By: Oswald Hillock (December 01, 2009 10:25 AM)  Physical Exam  General:  General: Well developed, thin, cachectic, NAD HEENT: OP clear,  mucus membranes moist SKIN: warm, dry Neuro: No focal deficits Musculoskeletal: Muscle strength 5/5 all ext Psychiatric: Mood and affect normal Neck: No JVD, no carotid bruits, no thyromegaly, no lymphadenopathy. Lungs:Clear bilaterally, no wheezes, rhonci, crackles CV: RRR no murmurs, gallops rubs Abdomen: soft, NT, ND, BS present Extremities: No edema, pulses 1+  bilateral DP, non-palpable bilateral PT.     Echocardiogram  Procedure date:  11/27/2009  Findings:      Left ventricle: The cavity size was normal. Wall thickness was at     the upper limits of normal. Systolic function was normal. The     estimated ejection fraction was in the range of 55% to 60%. Wall     motion was normal; there were no regional wall motion     abnormalities. Doppler parameters are consistent with abnormal     left ventricular relaxation (grade 1 diastolic dysfunction).   - Aortic valve: Mild regurgitation.  EKG  Procedure date:  12/01/2009  Findings:      NSR, rate 70 bpm. TWI avL, V1-V2.   Impression & Recommendations:  Problem # 1:  CHEST PAIN-UNSPECIFIED (ICD-786.50) Isolated episode of chest pain for which she was admitted and ruled out for an MI with serial cardiac enzymes. Echo with normal wall motion. She does have risk factors for CAD which include HTN and tobacco abuse. Will arrange exercise stress echo. I will see her back after that.  Her updated medication list for this problem includes:    Aspir-low 81 Mg Tbec (Aspirin) .Marland Kitchen... Take 1 tablet by mouth once a day    Metoprolol Tartrate 25 Mg Tabs (Metoprolol tartrate) .Marland Kitchen... Take 1 tablet by mouth two times a day  Problem # 2:  ESSENTIAL HYPERTENSION (ICD-401.9) Continue current therapy.  May adjust at next visit.   Her updated medication list for this problem includes:    Hydrochlorothiazide 25 Mg Tabs (Hydrochlorothiazide) .Marland Kitchen... Take 1 tablet by mouth once a day    Aspir-low 81 Mg Tbec (Aspirin) .Marland Kitchen... Take 1 tablet by mouth once a day    Metoprolol Tartrate 25 Mg Tabs (Metoprolol tartrate) .Marland Kitchen... Take 1 tablet by mouth two times a day  Patient Instructions: 1)  Your physician recommends that you schedule a follow-up appointment in: 4 weeks 2)  Your physician has requested that you have a stress echocardiogram. For further information please visit [email or web address]  Please follow instruction  sheet as given.  Appended Document: Orders Update    Clinical Lists Changes  Orders: Added new Referral order of Stress Echo (Stress Echo) - Signed

## 2010-11-02 NOTE — Progress Notes (Signed)
  Pt came in and dropped off papers from [employer] that need to be filled out forwarded to [employer] for processing. Karina Weber  December 14, 2009 9:49 AM

## 2010-11-02 NOTE — Miscellaneous (Signed)
Summary: [facility]  [facility]   Imported By: Florinda Marker 11/10/2009 13:40:42  _____________________________________________________________________  External Attachment:    Type:   Image     Comment:   External Document

## 2010-11-02 NOTE — Progress Notes (Signed)
Summary: Soc. 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  Called [facility] and they will fax me report once it is interpreted.  Also wants [facility] faxed which is not yet dictated.   SW will coordinate and ensure records sent once they are available.  Dgtr is calling me back with [facility] fax number.

## 2010-11-02 NOTE — Progress Notes (Signed)
Summary: phone/gg  Phone Note From Other Clinic   Summary of Call: Call from Doristine Johns RN from [facility].  She was told to get in touch with you, Dr Rogelia Boga. a note in the patients chart states "notify Dr Rogelia Boga of admission" Pt has been admitted by another MD # [id number] Initial call taken by: Merrie Roof RN,  May 12, 2010 4:35 PM  Follow-up for Phone Call        Spoke to the RN. Pt admitted to Dr Lowella Dandy with a PTX and chest tube. I asked Judeth Cornfield to make certain the Dr Lowella Dandy knows that the IM teasching service cannot do consults at [facility] long. Follow-up by: Blanch Media MD,  May 12, 2010 4:54 PM

## 2010-11-02 NOTE — Progress Notes (Signed)
Summary: [facility] CALLIN GRE QUESTIONAIRE**LMTCB**  Phone Note From Other Clinic   Caller: MS EMERSON [facility] Summary of Call: MS EMERSON [facility] SENT A QUESTIONAIRE ON THIS PT RE A BREATHING STUDY WANTS TO Loleta Rose YOU RE THIS-PLS CALL [phone] Initial call taken by: Glynda Jaeger,  April 09, 2010 10:36 AM  Follow-up for Phone Call        I left a message with [facility] (disability determination services) to call back. Julieta Gutting, RN, BSN  April 09, 2010 10:51 AM I called and spoke with Shaune Spittle at above number and told her we do not see pt for pulmonary issues and that pt has no follow up with [place]. I asked her to contact pt to determine who sees her for pulmonary issues.   Follow-up by: Dossie Arbour, RN, BSN,  May 10, 2010 9:49 AM

## 2010-11-02 NOTE — Letter (Signed)
Summary: Cardiac Catheterization Instructions- Main Lab  [facility], Main Office  [address]   [place], [place] [address]   Phone: [phone]  Fax: [phone]     12/28/2009 MRN: [id number]  [facility] [address] [place], [place]  [address]  Dear Ms. Brake,   You are scheduled for Cardiac Catheterization on  January 07, 2010             with Dr. Clifton James           .  Please arrive at the [facility] at   5:30    a.m.Marland Kitchen on the day of your procedure.  1. DIET     _X___ Nothing to eat or drink after midnight except your medications with a sip of water.  2. Come to the [place] office on (done today)            for lab work.  The lab at [facility] is open from 8:30 a.m. to 1:30 p.m. and 2:30 p.m. to 5:00 p.m.  The lab at [facility] is open from 7:30 a.m. to 5:30 p.m.  You do not have to be fasting.  3. MAKE SURE YOU TAKE YOUR ASPIRIN.  4. _____ DO NOT TAKE these medications before your procedure:         ________________________________________________________________________________      __X__ YOU MAY TAKE ALL of your remaining medications with a small amount of water.      ____ START NEW medications:     ________________________________________________________________________________      ____ Eilene Ghazi instructions:     ________________________________________________________________________________  5. Plan for one night stay - bring personal belongings (i.e. toothpaste, toothbrush, etc.)  6. Bring a current list of your medications and current insurance cards.  7. Must have a responsible person to drive you home.   8. Someone must be with yu for the first 24 hours after you arrive home.  9. Please wear clothes that are easy to get on and off and wear slip-on shoes.  *Special note: Every effort is made to have your procedure done on time.  Occasionally there are emergencies that present  themselves at the hospital that may cause delays.  Please be patient if a delay does occur.  If you have any questions after you get home, please call the office at the number listed above.  Dossie Arbour, RN, BSN

## 2010-11-02 NOTE — Miscellaneous (Signed)
Summary: [facility]  Soc. Work.  30 minutes. Gathered medical records.  Called [facility] for PFT study which they finally faxed.  Faxed July and August medical to [facility]/Ms. Emerson at [phone] so patient can obtain disability/Medicaid.

## 2010-11-02 NOTE — Assessment & Plan Note (Signed)
Summary: RA/NEEDS 1 MONTH F/U VISIT/CH   Vital Signs:  Patient profile:   55 year old female Height:      60.75 inches (154.31 cm) Weight:      77.3 pounds (35.14 kg) BMI:     14.78 O2 Sat:      85 % on Room air Temp:     99.0 degrees F Pulse rate:   108 / minute BP sitting:   164 / 87  (right arm) Cuff size:   small  Vitals Entered By: Dorie Rank RN (April 23, 2010 10:32 AM)  O2 Flow:  Room air CC: needs help getting meds and following up on disability - "they" got my breathing machine Wed. - ran out of meds 3 weeks ago and did not have $ to buy them, Depression Is Patient Diabetic? No Pain Assessment Patient in pain? yes     Location: h/a Intensity: 1 Type: "like hit in the heat by a hammer" Onset of pain  chronic- intermittent Nutritional Status BMI of < 19 = underweight  Have you ever been in a relationship where you felt threatened, hurt or afraid?No   Does patient need assistance? Functional Status Self care Ambulation Normal Comments "so mad and upset I can''t eat" - eat potato chips and cheetos and soda - going to lose car too - son lives with me - wanted to kill myself the other day" - both legs swell at times - social worker brought in to talk to pt O2 sat 94 % so O2 reduced to 2L. Dorie Rank RN  April 23, 2010 12:18 PM   Report called to Shanda Bumps on 3700 - pt taken to 3704 by w/c in cooperative, stable condition Dorie Rank RN  April 23, 2010 12:19 PM    Primary Care Provider:  Hartley Barefoot MD  CC:  needs help getting meds and following up on disability - "they" got my breathing machine Wed. - ran out of meds 3 weeks ago and did not have $ to buy them and Depression.  History of Present Illness: History is obtained from Dorie Rank, RN and Dorothe Pea, SW and patient seen with Dr. Julaine Fusi.  She states that she will hurt herself if she goes home and that no one is helping her.  She has been out of her medications for the last 3 weeks and  yesterday while visiting a friend she contemplated taking a full bottle of pills.  She states today that if she goes home she is going to take pills or lye and "just lay down and die."  See Admission note and Social work note from today for further details.  Depression History:      The patient is having a depressed mood most of the day and has a diminished interest in her usual daily activities.  Positive alarm features for depression include recurrent thoughts of death or suicide.        Suicide risk questions reveal that she does not feel like life is worth living and she has thought about ending her life.  The patient denies that she wishes that she were dead.        Comments:  wanted to take a bottle of pills at my friend's house yesterday. Chaplain and Security called and here to see pt - nursing admin and admitting notified of pt's suidical ideation.   Preventive Screening-Counseling & Management  Alcohol-Tobacco     Smoking Status: current     Smoking Cessation Counseling:  yes     Packs/Day: 2.0  Caffeine-Diet-Exercise     Does Patient Exercise: yes     Type of exercise: WALKS     Exercise (avg: min/session): 30-60     Times/week: 7  Allergies (verified): No Known Drug Allergies  Past History:  Past medical, surgical, family and social histories (including risk factors) reviewed for relevance to current acute and chronic problems.  Past Medical History: Reviewed history from 12/28/2009 and no changes required. COPD (ICD-496) ESSENTIAL HYPERTENSION (ICD-401.9) ALCOHOLISM (ICD-303.90) TOBACCO USE (ICD-305.1)  Past Surgical History: Reviewed history from 12/01/2009 and no changes required. None  Family History: Reviewed history from 12/01/2009 and no changes required. Mother unknown cause of death Father unknown cause of death No siblings  No CAD  Social History: Reviewed history from 12/01/2009 and no changes required. Lives with long term BF (>30 years) Nathaneil Canary, and son Edison Simon.  Has one other son who does not live with her. Single Works fulltime at [employer] on [employer] 5-6 cigarettes/day, Has smoked 1ppd for 40 years Drinks: Former abuse, now stopped since 2/11. Former MJ use, currently deniesPacks/Day:  2.0  Review of Systems      See HPI  Physical Exam  Additional Exam:  General: Thin, distressed female leaning forward in the chair with hands on her legs. Resp: decreased air entry bilaterally with scattered rales in the left base.   Impression & Recommendations:  Problem # 1:  SUICIDAL IDEATION (ICD-V62.84) Patient will be admitted and placed on suicide precautions.  At the time of transfer to inpatient team she is cooperative and willing to be hospitalized but would likely require commitment if she desires to leave AMA because of active plan and suicide intention as well as her inabilty to obtain her medications and critical medical illness. Orders: Social Work Referral (Social ) Lorazepam 2mg  Injection [phone])  Problem # 2:  COPD (ICD-496) O2 saturations are low today (85% on RA).  We will give her O2 and an albuterol nebulizer and monitor her in the clinic while we get her set for admission.  Following nebulizer, 4L by nasal cannula, and ativan 2 mg IM her saturations are 95%.  She will be admitted and worked up for possible COPD exacerbation and medication non-compliance/inability to obtain meds.  Her updated medication list for this problem includes:    Albuterol 90 Mcg/act Aers (Albuterol) .Marland Kitchen... 1-2 puffs inhaled as needed for wheeze or sob disp #1 mdi    Albuterol Sulfate (2.5 Mg/48ml) 0.083% Nebu (Albuterol sulfate) ..... Use every 6 hours as needed, dispense 30 day supply    Advair Diskus 250-50 Mcg/dose Aepb (Fluticasone-salmeterol) ..... Inhale twice a day. pt. needs inst. on how to use    Spiriva Handihaler 18 Mcg Caps (Tiotropium bromide monohydrate) ..... Inhale 1 crushed capsule  daily  Orders: Albuterol Sulfate Sol 1mg  unit dose (W4132) Atrovent 1mg  (Neb) [phone]) Admin of Therapeutic Inj  intramuscular or subcutaneous (27253)  Pulmonary Functions Reviewed: O2 sat: 85 (04/23/2010)     Complete Medication List: 1)  Albuterol 90 Mcg/act Aers (Albuterol) .Marland Kitchen.. 1-2 puffs inhaled as needed for wheeze or sob disp #1 mdi 2)  Albuterol Sulfate (2.5 Mg/106ml) 0.083% Nebu (Albuterol sulfate) .... Use every 6 hours as needed, dispense 30 day supply 3)  Ipratropium Bromide 0.06 % Soln (Ipratropium bromide) .... Inhale every 6 hour as needed. 4)  Advair Diskus 250-50 Mcg/dose Aepb (Fluticasone-salmeterol) .... Inhale twice a day. pt. needs inst. on how to use 5)  Hydrochlorothiazide 25 Mg Tabs (Hydrochlorothiazide) .... Take 1 tablet by mouth once a day 6)  Aspir-low 81 Mg Tbec (Aspirin) .... Take 1 tablet by mouth once a day 7)  Metoprolol Tartrate 25 Mg Tabs (Metoprolol tartrate) .... Take 1 tablet by mouth two times a day 8)  Spiriva Handihaler 18 Mcg Caps (Tiotropium bromide monohydrate) .... Inhale 1 crushed capsule daily  Other Orders: Urology Referral (Urology)   Prevention & Chronic Care Immunizations   Influenza vaccine: Not documented   Influenza vaccine deferral: Deferred  (04/23/2010)    Tetanus booster: Not documented   Td booster deferral: Deferred  (04/23/2010)    Pneumococcal vaccine: Not documented  Colorectal Screening   Hemoccult: Not documented    Colonoscopy: Not documented  Other Screening   Pap smear: Not documented    Mammogram: Not documented   Smoking status: current  (04/23/2010)   Smoking cessation counseling: yes  (04/23/2010)  Lipids   Total Cholesterol: 237  (03/19/2010)   LDL: 32  (03/19/2010)   LDL Direct: Not documented   HDL: 157  (03/19/2010)   Triglycerides: 240  (03/19/2010)  Hypertension   Last Blood Pressure: 164 / 87  (04/23/2010)   Serum creatinine: 0.52  (03/19/2010)   Serum potassium 3.7   (03/19/2010)  Self-Management Support :   Personal Goals (by the next clinic visit) :      Personal blood pressure goal: 140/90  (02/12/2010)   Patient will work on the following items until the next clinic visit to reach self-care goals:     Medications and monitoring: take my medicines every day, bring all of my medications to every visit  (03/16/2010)     Eating: drink diet soda or water instead of juice or soda, eat more vegetables, use fresh or frozen vegetables, eat foods that are low in salt, eat baked foods instead of fried foods, eat fruit for snacks and desserts, limit or avoid alcohol  (03/16/2010)     Activity: take a 30 minute walk every day  (03/16/2010)     Other: not taking meds, not eating, not exercising- ran out of money  (04/23/2010)    Hypertension self-management support: Written self-care plan, Education handout, Pre-printed educational material, Resources for patients handout  (03/16/2010)    Medication Administration  Injection # 1:    Medication: Lorazepam 2mg  Injection    Diagnosis: SUICIDAL IDEATION (ICD-V62.84)    Route: IM    Site: R deltoid    Exp Date: 12/2011    Lot #: [id number]    Mfr: [place]-Myers    Comments: Pt was given 1 mg IM    Given by: Angelina Ok RN (April 23, 2010 12:13 PM)  Medication # 1:    Medication: Albuterol Sulfate Sol 1mg  unit dose    Diagnosis: COPD (ICD-496)    Dose: 2.5 mg    Route: inhaled    Exp Date: 08/04/2011    Lot #: [id number]    Mfr: Nephron    Patient tolerated medication without complications    Given by: Dorie Rank RN (April 23, 2010 12:03 PM)  Medication # 2:    Medication: Atrovent 1mg  (Neb)    Diagnosis: COPD (ICD-496)    Dose: 0.5mg     Route: inhaled    Exp Date: 10/04/2011    Lot #: [id number]    Mfr: Nephron    Patient tolerated medication without complications    Given by: Dorie Rank RN (April 23, 2010 12:04 PM)  Orders Added: 1)  Urology Referral [  Urology] 2)  Social Work Referral [Social  ] 3)  Albuterol Sulfate Sol 1mg  unit dose [A2130] 4)  Atrovent 1mg  (Neb) [Q6578] 5)  Admin of Therapeutic Inj  intramuscular or subcutaneous [96372] 6)  Lorazepam 2mg  Injection [J2060] 7)  Est. Patient Level IV [46962]    Appended Document: RA/NEEDS 1 MONTH F/U VISIT/CH I have discussed the care of this patient in detail with the resident and agree fully with the documentation completed.

## 2010-11-02 NOTE — Assessment & Plan Note (Signed)
Summary: Social Work  Social Work.  90 minutes. Referred to social work by Dorie Rank and intern, Dr. Tonny Branch as patient was thinking about committing suicide by taking pills last night.   Met with patient in exam room and found a very angry and tearful patient who reports that she cannot get any help regarding her disability and she just cannot make ends meet and she is behind on her bills and cannot catch up.   The patient tells me she lives with her son at [address]  [address]  in [place]  [address].  She does not have a phone number for herself or for her son who works during the day.  There is a granddaughter in the picture Endyd Prettyman who can be reached at [phone].    The patient works at [employer] for $9 per hour and works a few days per week.  Review of her SSI paperwork shows  she makes $904 monthly but unclear if that is accurate.    The patient denies any past mental health treatment or counseling or any inpatient MH for suicide or suicidal ideation. She tells social work that things have recently gone down hill and she cannot get ahead.   Patient went to school in [place] and completed the 11th grade.  She reported that Math was her difficult subject.  She loves to read but admits to not fully understanding the SSI paperwork.   The patient reports that she will go home and try and kill herself with pills or lye or anything that is available if she doesn't get help.    The plan after discussion with Dr. Tonny Branch and Dr. Phillips Odor is to have her admitted due to COPD, FTT, nonadherence medication, and suicidal ideation.  A bed and room number has already been secured whereby psyche and involuntary commitment will be ordered.   Social Work will contact SSI and get them what they need to make appropriate disposition on the case.  Dr. Phillips Odor confirms that she has severe debilitating COPD and is in need of home oxygen which she cannot afford.

## 2010-11-02 NOTE — Miscellaneous (Signed)
Summary: Hospital Admission  INTERNAL MEDICINE ADMISSION HISTORY AND PHYSICAL  PCP: Dr. Tonny Branch  CC: Suicidal Ideation  HPI: 55 yr old woman with a history of COPD comes to the clinic for follow up of disability workup. Upon arrival she reported to the nurse that yesterday she was going to take a whole bottle of pills. Patient continues to have the same suicidal ideation and is planning to act upon her ideation if she goes home. Patient has not taken her medication for the last 3 weeks due to financial problems. She reports worsened cough for about a month it is sometimes productive of white colored sputum.  She states it feels like she she has congestion in her chest but "can't get it all out".  She is short of breath but reports no change from her baseline.  She denies any fever, headache, nasuea and vomitting, or pain.  She endorses nasal discharge and throat pain upon couging.    ALLERGIES: NKDA  PAST MEDICAL HISTORY: COPD (ICD-496) ESSENTIAL HYPERTENSION (ICD-401.9) ALCOHOLISM (ICD-303.90) TOBACCO USE (ICD-305.1)  MEDICATIONS:  ALBUTEROL 90 MCG/ACT AERS (ALBUTEROL) 1-2 puffs inhaled as needed for wheeze or sob Disp #1 MDI ALBUTEROL SULFATE (2.5 MG/3ML) 0.083% NEBU (ALBUTEROL SULFATE) use every 6 hours as needed, dispense 30 day supply IPRATROPIUM BROMIDE 0.06 % SOLN (IPRATROPIUM BROMIDE) Inhale every 6 hour as needed. ADVAIR DISKUS 250-50 MCG/DOSE AEPB (FLUTICASONE-SALMETEROL) Inhale twice a day. Pt. needs inst. on how to use HYDROCHLOROTHIAZIDE 25 MG TABS (HYDROCHLOROTHIAZIDE) Take 1 tablet by mouth once a day ASPIR-LOW 81 MG TBEC (ASPIRIN) Take 1 tablet by mouth once a day METOPROLOL TARTRATE 25 MG TABS (METOPROLOL TARTRATE) Take 1 tablet by mouth two times a day SPIRIVA HANDIHALER 18 MCG CAPS (TIOTROPIUM BROMIDE MONOHYDRATE) inhale 1 crushed capsule daily   SOCIAL HISTORY: Lives with long term BF (>30 years) Nathaneil Canary, and son Edison Simon.  Has one other son who does not live  with her. Single Works fulltime at [employer] on [employer] 5-6 cigarettes/day, Has smoked 1ppd for 40 years Drinks: Former abuse, now stopped since 2/11. Former MJ use, currently denies  FAMILY HISTORY Mother - unkonwn lung disease Father unknown lung disease No siblings  No CAD  ROS: As per HPI  VITALS: T:99  P:108  BP:164/87  R:  O2SAT: 85 ON:ra  PHYSICAL EXAM: (performed after admission as patient was initially uncooperative) Gen: Agitated HEENT: pupils equally round and reactive to light, some conjuctival pallor and incterus noted CV: regular rate, regular rhythm, no murmur, no gallop Resp: Crackles in left base and decreased air movement in both, decreased inspiratory effort Abdomen: soft, non-distended, + bs, tender to deep palpation in the epigastric area Extremities: no edema Neuro: a&o X3, CNs grossly intact, strenth 5/5   ASSESSMENT AND PLAN: (1) Suicidal Ideation: Patient will be involuntary commited. Given ativan for agitation. Psych consulted for possible transfer to behavioral health for further management of suicidal ideation.  (2) COPD Exacerbation: Patient will be placed on oxygen to keep O2 sat from 88-92%. Will start steroid taper, duonebs, advair, spiriva. Check xray to rule out Pnuemonia if patient cooperates. O2 saturation increased to 94% with nebulizer treatment. Will follow closely and consider Bipap if patient deteriorates.   (3) New Solitary Pulmonary Nodule:  Admission CXR shows new medial superior right lower lobe nodule measuring 2.5 cm.  Will order chest CT per radiology recommendations.    (4) Weight loss: A review of the clinic records shows a 13 pound weight loss since December  2010.  This is concerning for underlying malignancy, especially in the setting of a new lung nodule.    (4) Alcohol abuse: Place patient on CIWA protocol. Start thiamine and folate. Monitor closely.   (5) HTN- Restart home antihypertensives  (6) Tobacco  abuse- Social work consult for smoking cessation

## 2010-11-02 NOTE — Letter (Signed)
Summary: SPIROMETRY  SPIROMETRY   Imported By: Margie Billet 02/24/2010 11:00:58  _____________________________________________________________________  External Attachment:    Type:   Image     Comment:   External Document

## 2010-11-02 NOTE — Assessment & Plan Note (Signed)
Summary: FU/SB.   Vital Signs:  Patient profile:   55 year old female Height:      60.75 inches (154.31 cm) Weight:      76.04 pounds (34.56 kg) BMI:     14.54 O2 Sat:      97 % on Room air Temp:     98.3 degrees F (36.83 degrees C) oral Pulse rate:   81 / minute BP sitting:   124 / 80  (right arm) Cuff size:   small  Vitals Entered By: Angelina Ok RN (March 16, 2010 3:12 PM)  O2 Flow:  Room air CC: Depression Is Patient Diabetic? No Pain Assessment Patient in pain? no      Nutritional Status BMI of < 19 = underweight  Have you ever been in a relationship where you felt threatened, hurt or afraid?No   Does patient need assistance? Functional Status Self care Ambulation Normal Comments Needs refills on meds. Cough.  Thick white mucous.  Check up. Uses Oxygen at home.  Does not use while working.  Shortness of breath when walking.  94% when ambulated today.   Primary Care Provider:  Hartley Barefoot MD  CC:  Depression.  History of Present Illness: Karina Weber is a 55 yo woman with PMH as outlined in chart.  She is here for 1 month follow up from her last visit.  She had some abnormal labs including slightly elevated AP and AST along with elevated TG non-fasting.  Also had B12 level in the 300s with recommended MMA on f/u.  She also had spirometry showing severe air flow limitation without response to bronchodilators.  Also reports using O2 as needed.   Of note, pt only has HCTZ, metoprolol and Advair....Marland Kitchenonly meds she is taking.   Depression History:      The patient denies a depressed mood most of the day and a diminished interest in her usual daily activities.         Preventive Screening-Counseling & Management  Alcohol-Tobacco     Smoking Status: current     Smoking Cessation Counseling: yes     Packs/Day: 2 cigs per day     Cigars/week: 2 A WEEK  Current Medications (verified): 1)  Albuterol 90 Mcg/act Aers (Albuterol) .Marland Kitchen.. 1-2 Puffs Inhaled As Needed For  Wheeze or Sob Disp #1 Mdi 2)  Albuterol Sulfate (2.5 Mg/49ml) 0.083% Nebu (Albuterol Sulfate) .... Use Every 6 Hours As Needed, Dispense 30 Day Supply 3)  Ipratropium Bromide 0.06 % Soln (Ipratropium Bromide) .... Inhale Every 6 Hour As Needed. 4)  Advair Diskus 250-50 Mcg/dose Aepb (Fluticasone-Salmeterol) .... Inhale Twice A Day. Pt. Needs Inst. On How To Use 5)  Hydrochlorothiazide 25 Mg Tabs (Hydrochlorothiazide) .... Take 1 Tablet By Mouth Once A Day 6)  Aspir-Low 81 Mg Tbec (Aspirin) .... Take 1 Tablet By Mouth Once A Day 7)  Metoprolol Tartrate 25 Mg Tabs (Metoprolol Tartrate) .... Take 1 Tablet By Mouth Two Times A Day  Allergies (verified): No Known Drug Allergies  Past History:  Past Medical History: Last updated: 12/28/2009 COPD (ICD-496) ESSENTIAL HYPERTENSION (ICD-401.9) ALCOHOLISM (ICD-303.90) TOBACCO USE (ICD-305.1)  Past Surgical History: Last updated: 12/01/2009 None  Family History: Last updated: 12/01/2009 Mother unknown cause of death Father unknown cause of death No siblings  No CAD  Social History: Last updated: 12/01/2009 Lives with long term BF (>30 years) Nathaneil Canary, and son Edison Simon.  Has one other son who does not live with her. Single Works fulltime at [employer] on  [address] Smokes 5-6 cigarettes/day, Has smoked 1ppd for 40 years Drinks: Former abuse, now stopped since 2/11. Former MJ use, currently denies  Risk Factors: Smoking Status: current (03/16/2010) Packs/Day: 2 cigs per day (03/16/2010) Cigars/wk: 2 A WEEK (03/16/2010)  Review of Systems      See HPI  Physical Exam  General:  Frail and thin Eyes:  melanocytosis, anicteric Neck:  thin, prominent EJ but no JVD. Lungs:  very distant, normal respiratory effort, no accessory muscle use, normal breath sounds, and no wheezes.   Heart:  normal rate, regular rhythm, no murmur, and no gallop.  ? RV heave and prominent S2 (? elevated PAP) Abdomen:  normal bowel sounds.     Extremities:  no edema Neurologic:  alert & oriented X3, cranial nerves II-XII intact, strength normal in all extremities, and gait normal.   Psych:  Oriented X3, memory intact for recent and remote, and normally interactive.     Impression & Recommendations:  Problem # 1:  ESSENTIAL HYPERTENSION (ICD-401.9)  at goal will put in for BMP given meds....to come back while fasting....see below  Her updated medication list for this problem includes:    Hydrochlorothiazide 25 Mg Tabs (Hydrochlorothiazide) .Marland Kitchen... Take 1 tablet by mouth once a day    Metoprolol Tartrate 25 Mg Tabs (Metoprolol tartrate) .Marland Kitchen... Take 1 tablet by mouth two times a day  BP today: 124/80 Prior BP: 157/90 (02/12/2010)  Labs Reviewed: K+: 4.3 (02/12/2010) Creat: : 0.51 (02/12/2010)   Chol: 302 (02/12/2010)   HDL: 179 (02/12/2010)   LDL: 62 (02/12/2010)   TG: 303 (02/12/2010)  Future Orders: T-Basic Metabolic Panel [id number]) ... 03/19/2010  Problem # 2:  COPD (ICD-496) severe airflow limitation without response to SABA would like long acting anticholinergic (spiriva), however, if this is not possible would consider ipratropium (2 puffs qid).  Once spiriva obtained, would continue:  advair, spiriva and albuterol inhaler as needed (no albuterol or ipratropium nebs)  Her updated medication list for this problem includes:    Albuterol 90 Mcg/act Aers (Albuterol) .Marland Kitchen... 1-2 puffs inhaled as needed for wheeze or sob disp #1 mdi    Albuterol Sulfate (2.5 Mg/42ml) 0.083% Nebu (Albuterol sulfate) ..... Use every 6 hours as needed, dispense 30 day supply    Advair Diskus 250-50 Mcg/dose Aepb (Fluticasone-salmeterol) ..... Inhale twice a day. pt. needs inst. on how to use    Spiriva Handihaler 18 Mcg Caps (Tiotropium bromide monohydrate) ..... Inhale 1 crushed capsule daily  Problem # 3:  ALCOHOLISM (ICD-303.90) B12 300s, will check MMA per Dr. Lamar Blinks note  Future Orders: T- * Misc. Laboratory test [id number]) ...  03/19/2010  Complete Medication List: 1)  Albuterol 90 Mcg/act Aers (Albuterol) .Marland Kitchen.. 1-2 puffs inhaled as needed for wheeze or sob disp #1 mdi 2)  Albuterol Sulfate (2.5 Mg/21ml) 0.083% Nebu (Albuterol sulfate) .... Use every 6 hours as needed, dispense 30 day supply 3)  Ipratropium Bromide 0.06 % Soln (Ipratropium bromide) .... Inhale every 6 hour as needed. 4)  Advair Diskus 250-50 Mcg/dose Aepb (Fluticasone-salmeterol) .... Inhale twice a day. pt. needs inst. on how to use 5)  Hydrochlorothiazide 25 Mg Tabs (Hydrochlorothiazide) .... Take 1 tablet by mouth once a day 6)  Aspir-low 81 Mg Tbec (Aspirin) .... Take 1 tablet by mouth once a day 7)  Metoprolol Tartrate 25 Mg Tabs (Metoprolol tartrate) .... Take 1 tablet by mouth two times a day 8)  Spiriva Handihaler 18 Mcg Caps (Tiotropium bromide monohydrate) .... Inhale 1 crushed capsule daily  Other Orders: Future Orders: T-Lipid Profile ([id number]) ... 03/19/2010  Patient Instructions: 1)  Please schedule a follow-up appointment in 1 month. 2)  Make sure to return to clinic on friday for blood work. 3)  Have ordered spiriva from [facility] pharmacy, once you obtain the spiriva do not use ipratropium anymore. 4)  If you have any other problems before next appointment, call clinic. Prescriptions: SPIRIVA HANDIHALER 18 MCG CAPS (TIOTROPIUM BROMIDE MONOHYDRATE) inhale 1 crushed capsule daily  #1 x 6   Entered and Authorized by:   Mariea Stable MD   Signed by:   Mariea Stable MD on 03/16/2010   Method used:   Faxed to ...       [facility] (retail)       [address] [place], [place]  [address]       Ph: [phone]       Fax: [phone]   RxID:   [id number]   Prevention & Chronic Care Immunizations   Influenza vaccine: Not documented    Tetanus booster: Not documented    Pneumococcal vaccine: Not documented  Colorectal Screening   Hemoccult: Not documented    Colonoscopy:  Not documented  Other Screening   Pap smear: Not documented    Mammogram: Not documented   Smoking status: current  (03/16/2010)   Smoking cessation counseling: yes  (03/16/2010)  Lipids   Total Cholesterol: 302  (02/12/2010)   LDL: 62  (02/12/2010)   LDL Direct: Not documented   HDL: 179  (02/12/2010)   Triglycerides: 303  (02/12/2010)  Hypertension   Last Blood Pressure: 124 / 80  (03/16/2010)   Serum creatinine: 0.51  (02/12/2010)   Serum potassium 4.3  (02/12/2010)  Self-Management Support :   Personal Goals (by the next clinic visit) :      Personal blood pressure goal: 140/90  (02/12/2010)   Patient will work on the following items until the next clinic visit to reach self-care goals:     Medications and monitoring: take my medicines every day, bring all of my medications to every visit  (03/16/2010)     Eating: drink diet soda or water instead of juice or soda, eat more vegetables, use fresh or frozen vegetables, eat foods that are low in salt, eat baked foods instead of fried foods, eat fruit for snacks and desserts, limit or avoid alcohol  (03/16/2010)     Activity: take a 30 minute walk every day  (03/16/2010)    Hypertension self-management support: Written self-care plan, Education handout, Pre-printed educational material, Resources for patients handout  (03/16/2010)   Hypertension self-care plan printed.   Hypertension education handout printed      Resource handout printed.  Process Orders Check Orders Results:     [facility]: ABN not required for this insurance Tests Sent for requisitioning (March 16, 2010 4:12 PM):     03/19/2010: [facility] -- T-Basic Metabolic Panel [phone] (signed)     03/19/2010: [facility] -- T-Lipid Profile [id number] (signed)     03/19/2010: [facility] -- T- * Misc. Laboratory test [phone] (signed)

## 2010-11-02 NOTE — Letter (Signed)
Summary: Return To Work  [facility] [employer], Main Office  [address]   [place], [place] [address]   Phone: [phone]  Fax: [phone]    12/01/2009  TO: Leodis Sias IT MAY CONCERN   RE: Karina Weber [place]les [address] [place]   The above named individual is under my medical care and may return to work on: December 01, 2009  If you have any further questions or need additional information, please call.     Sincerely,  Verne Carrow, MD

## 2010-11-02 NOTE — Progress Notes (Signed)
----   Converted from flag ---- ---- 04/08/2010 8:49 AM, Chinita Pester RN wrote:   ---- 04/06/2010 9:17 AM, Shon Hough wrote: Can't get in touch with this patient but, she does have a sch/appt with her PCP on 04/23/2010.  Phone # sounds like a fax number.  ---- 03/29/2010 10:23 AM, Chinita Pester RN wrote:   ---- 03/24/2010 2:13 PM, Blanch Media MD wrote: I am following up on Dr Augusto Gamble blood results.  WOuld you pleaseask pt to come in for lab draw this week of Monday?  thanks ------------------------------

## 2010-11-02 NOTE — Letter (Signed)
Summary: Appointment - Missed  [facility], Main Office  [address]   [place], [place] [address]   Phone: [phone]  Fax: [phone]     Feb 10, 2010 MRN: [id number]   [facility] [address] [place], [place]  [address]   Dear Ms. Brashier,  Our records indicate you missed your appointment on 01/26/2010 with Dr. Clifton James. It is very important that we reach you to reschedule this appointment. We look forward to participating in your health care needs. Please contact us at the number listed above at your earliest convenience to reschedule this appointment.     Sincerely, Neurosurgeon Team LG

## 2010-11-02 NOTE — Progress Notes (Signed)
Summary: Soc. Work  Programme researcher, broadcasting/film/video of Call: Call to Disability/Ms. Deatra Canter to send her hospital discharge report.   She said decision has already been made on the case but she was not able to tell me what the decision was on the case.        Appended Document: Soc. Work 8/3/ 11  Daughter in Social worker Ferdinand Lango  [phone] called re: Disability.  I told her that I had called Disability and they said decision already made but could not tell me any details.   Appended Document: Soc. Work Returned Ms. Blackmon's call who said that Disability is now saying they need more information.  Left message for her to call me as to what is needed and where to send.

## 2010-11-02 NOTE — Miscellaneous (Signed)
Summary: Hospital Admission  INTERNAL MEDICINE TEACHING SERVICE ADMISSION HISTORY AND PHYSICAL  Attending:   Dr. Ulyess Mort 1st Contact: Dr. Denton Meek [phone] 2nd Contact: Dr. Threasa Beards     [phone]  After 5 pm on weekdays, on weekends and holidays: 1st Contact: [phone] 2nd Contact: [phone]  PCP: Dr. Hartley Barefoot  CC: High blood pressure and chest pain  [id number] is a 55 year old woman with a PMH of HTN, tobacco abuse and COPD on home oxygen who presents to the emergency department with a chief complaint of elevated blood pressure and chest pain. She reports that earlier in the day, prior to work she was having chest pain which was 7/10 in intensity. She was making bread in the morning when this occurred and denies any increased shortness of breath with ambulation above her baseline secondary to her COPD. She states that the chest pain was substernal and felt like a "pulling" and that food would not go down correctly. She states that she did vomit x1, but denies any sweating with the pain. She did note that her hands were swollen at the time, but that her hand swelling is now resolved. She took two [facility] powders which helped to resolve the pain. She also reports high blood pressure that she was aware of secondary to a frontal headache, 7/10 intensity and without any neurologic symptoms. She shares that she has not filled her prescriptions for her anti-hypertensive medications because she lost the prescription after her grandkids were in her house. Patient denies any other complaints.  ALLERGIES: NKDA  PAST MEDICAL HISTORY: COPD Pneumonia, hospitalized 07/2007 (required intubation) Menopause at age 38  MEDICATIONS: ALBUTEROL 90 MCG/ACT AERS (ALBUTEROL) 1-2 puffs inhaled as needed for wheeze or sob Disp #1 MDI ALBUTEROL SULFATE (2.5 MG/3ML) 0.083% NEBU (ALBUTEROL SULFATE) use every 6 hours as needed, dispense 30 day supply IPRATROPIUM BROMIDE 0.06 % SOLN (IPRATROPIUM BROMIDE) Inhale  every 6 hour as needed. ADVAIR DISKUS 250-50 MCG/DOSE AEPB (FLUTICASONE-SALMETEROL) Inhale twice a day. HYDROCHLOROTHIAZIDE 12.5 MG CAPS Take 1 tablet by mouth daily.  SOCIAL HISTORY: Has two sons and 4 grandchildren Smokes 3 cigarettes/day Drinks 0-1 12oz beer/day Former [employer] user, currently denies  FAMILY HISTORY Mother died from lung cancer Father unknown No siblings  ROS: Negative except as per HPI  VITALS: T: 98.2 P: 107->83 BP: 190/89->175/80 R: 19->16 O2SAT: 90% RA->100% 2LPM  PHYSICAL EXAM: General:  alert, thin, and cooperative to examination.   Head:  normocephalic and atraumatic.   Eyes:  vision grossly intact.  Mouth:  pharynx pink and moist, no erythema, and no exudates.  Neck:  supple, full ROM, no thyromegaly, no JVD, and no carotid bruits.  Lungs:  chronic use of accesory respiratory muscles noted, but improved today, mild crackles, and no wheezing. Heart:  distant, normal rate, regular rhythm, no murmur, no gallop, and no rub.   Abdomen:  soft, non-tender, normal bowel sounds, no distention, no guarding, no rebound tenderness.     Pulses:  2+ DP/PT pulses bilaterally Extremities:  No edema. Neurologic:  alert & oriented X3, cranial nerves II-XII intact, strength normal in all extremities, sensation grossly intact.   Skin:  turgor normal and no rashes.  Psych:  Oriented X3, memory intact for recent and remote, normally interactive, good eye contact, not anxious appearing, and not depressed appearing.  LABS: WBC  7.8               4.0-10.5         K/uL RBC                                      3.85       l      3.87-5.11        MIL/uL Hemoglobin (HGB)                         12.1              12.0-15.0        g/dL Hematocrit (HCT)                         35.9       l      36.0-46.0        % MCV                                      93.4              78.0-100.0       fL MCHC                                     33.8               30.0-36.0        g/dL RDW                                      20.3       h      11.5-15.5        % Platelet Count (PLT)                     158               150-400          K/uL Nucleated RBCs                           0                 0                /100 WBC Blasts                                   0                                  % Promyelocytes                            0                                  %  Myelocytes                               0                                  % Metamyelocytes                           0                                  % Band Neutrophils                         0                 0-10             % Neutrophils, %                           66                43-77            % Lymphocytes, %                           24                12-46            % Monocytes, %                             6                 3-12             % Eosinophils, %                           4                 0-5              % Basophils, %                             0                 0-1              % Neutrophils, Absolute                    5.1               1.7-7.7          K/uL Lymphocytes, Absolute                    1.9               0.7-4.0          K/uL Monocytes, Absolute                      0.5  0.1-1.0          K/uL Eosinophils, Absolute                    0.3               0.0-0.7          K/uL Basophils, Absolute                      0.0               0.0-0.1          K/uL  Hemoglobin (HGB)                         13.3              12.0-15.0        g/dL Hematocrit (HCT)                         39.0              36.0-46.0        % Sodium (NA)                              136               135-145          mEq/L Potassium (K)                            3.6               3.5-5.1          mEq/L Chloride                                 98                96-112           mEq/L TCO2                                     36                0-100             mmol/L BUN                                      12                6-23             mg/dL Creatinine                               0.7               0.4-1.2          mg/dL Glucose  97                70-99            mg/dL Ionized Calcium                          1.07       l      1.12-1.32        mmol/L  CKMB, POC                                4.0               1.0-8.0          ng/mL Troponin I, POC                          <0.05             0.00-0.09        ng/mL Myoglobin, POC                           30.2              12-200           ng/mL  EKG: Sinus, regular, rate 94, borderline right axis, prominent p waves c/w R atrial enlargement, no ST elevations or depression, T-wave inversions in V1-V3/4 which is changed from previous EKG in Nov. 2010 which was used as comparison.  CXR 2-VIEW No acute cardiopulmonary disease.  No acute stable changes of COPD.  ASSESSMENT AND PLAN: 1. Chest pain: Patient with EKG changes from previous, will rule out ACS with serial cardiac enzymes. Adding aspirin and simvastatin and restarting patient's home medications for HTN. Will recheck an EKG in the AM and check PT/INR, FLP, TSH and UDS. Pain may be related to angina, htn urgency, musculoskeletal as other potential etiologies such as pneumothorax or pneumonia are less likely given results of CXR and clinical presentation. Unlikely PE as no hx of immobilization and oxygen requirements are unchanged. She has no history of recent hospitalization or hypercoagulable state and we have other diagnoses that could cause this presentation. 2. HTN urgency: Likely secondary to medical non-compliance. Patient reports that she has minimal material resources and would likely benefit from SW consult with regards to obtaining her medications, either now or in the clinic. Will restart her home medications while here, and avoid beta blockers given her COPD. 3. COPD: Patient on home oxygen and  continues to smoke. Counseled patient to quit smoking. Will continue home inhalers and oxygen. 4. VTE ppx: Lovenox.    Attending Physician: I performed and/or observed a history and physical examination of the patient. I discussed the case with the residents as noted and reviewed the residents' notes. I agree with the findings and plan -- please refer to the attending physician note for more details.   Signature: ____________________________        Printed name: ___________________________

## 2010-11-02 NOTE — Progress Notes (Signed)
  Request for records from [facility] forwarded to [facility]. [facility]  January 04, 2010 8:29 AM

## 2010-11-04 NOTE — Progress Notes (Signed)
Summary: Refill/gh  Phone Note Refill Request Message from:  Fax from Pharmacy on September 30, 2010 12:14 PM  Refills Requested: Medication #1:  ALBUTEROL 90 MCG/ACT AERS 1-2 puffs inhaled as needed for wheeze or sob Disp #1 MDI Last office visit was 05/2010.   Method Requested: Fax to Local Pharmacy Initial call taken by: Angelina Ok RN,  September 30, 2010 12:14 PM  Follow-up for Phone Call        Refill approved-nurse to complete Follow-up by: Julaine Fusi  DO,  September 30, 2010 1:42 PM    Prescriptions: ALBUTEROL 90 MCG/ACT AERS (ALBUTEROL) 1-2 puffs inhaled as needed for wheeze or sob Disp #1 MDI  #1 x 6   Entered and Authorized by:   Julaine Fusi  DO   Signed by:   Julaine Fusi  DO on 09/30/2010   Method used:   Faxed to ...       [facility] (retail)       [address] [place], [place]  [address]       Ph: [phone]       Fax: [phone]   RxID:   [id number]

## 2010-12-03 ENCOUNTER — Encounter: Payer: Self-pay | Admitting: Internal Medicine

## 2010-12-03 DIAGNOSIS — F17200 Nicotine dependence, unspecified, uncomplicated: Secondary | ICD-10-CM

## 2010-12-03 DIAGNOSIS — C3490 Malignant neoplasm of unspecified part of unspecified bronchus or lung: Secondary | ICD-10-CM

## 2010-12-16 LAB — COMPREHENSIVE METABOLIC PANEL
AST: 22 U/L (ref 0–37)
Albumin: 3.7 g/dL (ref 3.5–5.2)
Alkaline Phosphatase: 117 U/L (ref 39–117)
BUN: 15 mg/dL (ref 6–23)
CO2: 33 mEq/L — ABNORMAL HIGH (ref 19–32)
Chloride: 102 mEq/L (ref 96–112)
Creatinine, Ser: 0.53 mg/dL (ref 0.4–1.2)
GFR calc Af Amer: >60 mL/min (ref >60)
Sodium: 143 mEq/L (ref 135–145)

## 2010-12-16 LAB — CBC
HCT: 36.5 % (ref 36.0–46.0)
MCH: 31.6 pg (ref 26.0–34.0)
MCHC: 33.2 g/dL (ref 30.0–36.0)
MCV: 95.3 fL (ref 78.0–100.0)
Platelets: 218 10*3/uL (ref 150–400)
RBC: 3.83 MIL/uL — ABNORMAL LOW (ref 3.87–5.11)
WBC: 9.6 10*3/uL (ref 4.0–10.5)

## 2010-12-17 LAB — APTT: aPTT: 31 seconds (ref 24–37)

## 2010-12-17 LAB — CBC
HCT: 35.4 % — ABNORMAL LOW (ref 36.0–46.0)
MCH: 31.7 pg (ref 26.0–34.0)
MCHC: 33.7 g/dL (ref 30.0–36.0)
MCV: 94.2 fL (ref 78.0–100.0)
Platelets: 229 10*3/uL (ref 150–400)
RBC: 3.76 MIL/uL — ABNORMAL LOW (ref 3.87–5.11)
WBC: 7.3 10*3/uL (ref 4.0–10.5)

## 2010-12-18 LAB — CBC
HCT: 38 % (ref 36.0–46.0)
HCT: 40.6 % (ref 36.0–46.0)
Hemoglobin: 11.9 g/dL — ABNORMAL LOW (ref 12.0–15.0)
Hemoglobin: 12.5 g/dL (ref 12.0–15.0)
MCH: 32 pg (ref 26.0–34.0)
MCH: 32.7 pg (ref 26.0–34.0)
MCH: 32.8 pg (ref 26.0–34.0)
MCHC: 33 g/dL (ref 30.0–36.0)
MCHC: 33.6 g/dL (ref 30.0–36.0)
MCHC: 33.7 g/dL (ref 30.0–36.0)
MCV: 96.5 fL (ref 78.0–100.0)
MCV: 96.8 fL (ref 78.0–100.0)
MCV: 97.1 fL (ref 78.0–100.0)
Platelets: 169 10*3/uL (ref 150–400)
Platelets: 190 10*3/uL (ref 150–400)
RBC: 3.63 MIL/uL — ABNORMAL LOW (ref 3.87–5.11)
RBC: 3.82 MIL/uL — ABNORMAL LOW (ref 3.87–5.11)
RBC: 3.94 MIL/uL (ref 3.87–5.11)
RBC: 4.19 MIL/uL (ref 3.87–5.11)
WBC: 6.4 10*3/uL (ref 4.0–10.5)
WBC: 9.2 10*3/uL (ref 4.0–10.5)

## 2010-12-18 LAB — DIFFERENTIAL
Basophils Absolute: 0 10*3/uL (ref 0.0–0.1)
Eosinophils Absolute: 0 10*3/uL (ref 0.0–0.7)
Eosinophils Relative: 0 % (ref 0–5)
Eosinophils Relative: 1 % (ref 0–5)
Lymphocytes Relative: 19 % (ref 12–46)
Lymphs Abs: 0.6 10*3/uL — ABNORMAL LOW (ref 0.7–4.0)
Lymphs Abs: 1.2 10*3/uL (ref 0.7–4.0)
Monocytes Absolute: 0.2 10*3/uL (ref 0.1–1.0)
Monocytes Absolute: 0.4 10*3/uL (ref 0.1–1.0)
Neutrophils Relative %: 86 % — ABNORMAL HIGH (ref 43–77)

## 2010-12-18 LAB — BETA-2-GLYCOPROTEIN I ABS, IGG/M/A: Beta-2-Glycoprotein I IgA: 2 A Units (ref <20)

## 2010-12-18 LAB — HEPATIC FUNCTION PANEL
ALT: 31 U/L (ref 0–35)
Alkaline Phosphatase: 154 U/L — ABNORMAL HIGH (ref 39–117)
Bilirubin, Direct: 0.2 mg/dL (ref 0.0–0.3)
Indirect Bilirubin: 0.3 mg/dL (ref 0.3–0.9)
Total Bilirubin: 0.5 mg/dL (ref 0.3–1.2)
Total Protein: 6.5 g/dL (ref 6.0–8.3)

## 2010-12-18 LAB — BASIC METABOLIC PANEL
BUN: 10 mg/dL (ref 6–23)
CO2: 28 mEq/L (ref 19–32)
CO2: 33 mEq/L — ABNORMAL HIGH (ref 19–32)
CO2: 33 mEq/L — ABNORMAL HIGH (ref 19–32)
Calcium: 8.9 mg/dL (ref 8.4–10.5)
Calcium: 9.1 mg/dL (ref 8.4–10.5)
Chloride: 94 mEq/L — ABNORMAL LOW (ref 96–112)
Chloride: 97 mEq/L (ref 96–112)
Chloride: 99 mEq/L (ref 96–112)
Creatinine, Ser: 0.4 mg/dL (ref 0.4–1.2)
Creatinine, Ser: 0.55 mg/dL (ref 0.4–1.2)
GFR calc Af Amer: >60 mL/min (ref >60)
GFR calc Af Amer: >60 mL/min (ref >60)
GFR calc Af Amer: >60 mL/min (ref >60)
GFR calc Af Amer: >60 mL/min (ref >60)
GFR calc non Af Amer: >60 mL/min (ref >60)
Glucose, Bld: 147 mg/dL — ABNORMAL HIGH (ref 70–99)
Glucose, Bld: 92 mg/dL (ref 70–99)
Potassium: 3.7 mEq/L (ref 3.5–5.1)
Potassium: 3.9 mEq/L (ref 3.5–5.1)
Potassium: 4 mEq/L (ref 3.5–5.1)
Sodium: 132 mEq/L — ABNORMAL LOW (ref 135–145)
Sodium: 136 mEq/L (ref 135–145)

## 2010-12-18 LAB — LUPUS ANTICOAGULANT PANEL: PTT Lupus Anticoagulant: 34.9 secs (ref 30.0–45.6)

## 2010-12-18 LAB — PROTHROMBIN GENE MUTATION

## 2010-12-18 LAB — PROTIME-INR
INR: 0.89 (ref 0.00–1.49)
Prothrombin Time: 12 seconds (ref 11.6–15.2)

## 2010-12-18 LAB — PROTEIN S, TOTAL: Protein S Ag, Total: 123 % (ref 70–140)

## 2010-12-18 LAB — ANTITHROMBIN III: AntiThromb III Func: 129 % — ABNORMAL HIGH (ref 76–126)

## 2010-12-18 LAB — PROTEIN S ACTIVITY: Protein S Activity: 123 % (ref 69–129)

## 2010-12-18 LAB — GLUCOSE, CAPILLARY: Glucose-Capillary: 95 mg/dL (ref 70–99)

## 2010-12-18 LAB — CARDIOLIPIN ANTIBODIES, IGG, IGM, IGA: Anticardiolipin IgA: 3 APL U/mL — ABNORMAL LOW (ref <22)

## 2010-12-22 LAB — CBC
Hemoglobin: 11.3 g/dL — ABNORMAL LOW (ref 12.0–15.0)
Hemoglobin: 12.1 g/dL (ref 12.0–15.0)
Hemoglobin: 11.7 g/dL — ABNORMAL LOW (ref 12.0–15.0)
MCHC: 34.3 g/dL (ref 30.0–36.0)
MCHC: 34.5 g/dL (ref 30.0–36.0)
MCV: 93.5 fL (ref 78.0–100.0)
MCV: 98.4 fL (ref 78.0–100.0)
Platelets: 158 10*3/uL (ref 150–400)
RBC: 3.52 MIL/uL — ABNORMAL LOW (ref 3.87–5.11)
RBC: 3.46 MIL/uL — ABNORMAL LOW (ref 3.87–5.11)
RDW: 20.3 % — ABNORMAL HIGH (ref 11.5–15.5)
RDW: 15.3 % (ref 11.5–15.5)

## 2010-12-22 LAB — DIFFERENTIAL
Blasts: 0 %
Lymphocytes Relative: 24 % (ref 12–46)
Lymphs Abs: 1.9 10*3/uL (ref 0.7–4.0)
Monocytes Absolute: 0.5 10*3/uL (ref 0.1–1.0)
Monocytes Relative: 6 % (ref 3–12)
nRBC: 0 /100 WBC

## 2010-12-22 LAB — COMPREHENSIVE METABOLIC PANEL
ALT: 22 U/L (ref 0–35)
CO2: 30 mEq/L (ref 19–32)
Calcium: 7.9 mg/dL — ABNORMAL LOW (ref 8.4–10.5)
Chloride: 97 mEq/L (ref 96–112)
GFR calc non Af Amer: >60 mL/min (ref >60)
Glucose, Bld: 90 mg/dL (ref 70–99)
Sodium: 137 mEq/L (ref 135–145)
Total Bilirubin: 0.6 mg/dL (ref 0.3–1.2)

## 2010-12-22 LAB — POCT I-STAT, CHEM 8
BUN: 12 mg/dL (ref 6–23)
Calcium, Ion: 1.07 mmol/L — ABNORMAL LOW (ref 1.12–1.32)
Chloride: 98 meq/L (ref 96–112)
Creatinine, Ser: 0.7 mg/dL (ref 0.4–1.2)
Glucose, Bld: 97 mg/dL (ref 70–99)
HCT: 39 % (ref 36.0–46.0)
Hemoglobin: 13.3 g/dL (ref 12.0–15.0)
Potassium: 3.6 meq/L (ref 3.5–5.1)
Sodium: 136 meq/L (ref 135–145)
TCO2: 36 mmol/L (ref 0–100)

## 2010-12-22 LAB — BASIC METABOLIC PANEL
CO2: 34 mEq/L — ABNORMAL HIGH (ref 19–32)
Calcium: 8.8 mg/dL (ref 8.4–10.5)
Chloride: 97 mEq/L (ref 96–112)
Creatinine, Ser: 0.51 mg/dL (ref 0.4–1.2)
GFR calc Af Amer: >60 mL/min (ref >60)
Glucose, Bld: 110 mg/dL — ABNORMAL HIGH (ref 70–99)
Sodium: 137 mEq/L (ref 135–145)

## 2010-12-22 LAB — POCT I-STAT 3, VENOUS BLOOD GAS (G3P V)
Acid-Base Excess: 2 mmol/L (ref 0.0–2.0)
Acid-Base Excess: 4 mmol/L — ABNORMAL HIGH (ref 0.0–2.0)
O2 Saturation: 66 %
TCO2: 31 mmol/L (ref 0–100)
pH, Ven: 7.348 — ABNORMAL HIGH (ref 7.250–7.300)

## 2010-12-22 LAB — RAPID URINE DRUG SCREEN, HOSP PERFORMED
Amphetamines: NOT DETECTED
Barbiturates: NOT DETECTED
Benzodiazepines: NOT DETECTED
Cocaine: NOT DETECTED
Opiates: NOT DETECTED
Tetrahydrocannabinol: NOT DETECTED

## 2010-12-22 LAB — CARDIAC PANEL(CRET KIN+CKTOT+MB+TROPI)
CK, MB: 6.2 ng/mL (ref 0.3–4.0)
CK, MB: 6.4 ng/mL (ref 0.3–4.0)
Total CK: 142 U/L (ref 7–177)
Troponin I: 0.02 ng/mL (ref 0.00–0.06)

## 2010-12-22 LAB — LIPID PANEL
Cholesterol: 258 mg/dL — ABNORMAL HIGH (ref 0–200)
HDL: 196 mg/dL (ref >39)

## 2010-12-22 LAB — POCT I-STAT 3, ART BLOOD GAS (G3+)
O2 Saturation: 95 %
TCO2: 31 mmol/L (ref 0–100)
pCO2 arterial: 51.7 mmHg — ABNORMAL HIGH (ref 35.0–45.0)
pCO2 arterial: 53.8 mmHg — ABNORMAL HIGH (ref 35.0–45.0)
pH, Arterial: 7.368 (ref 7.350–7.400)
pO2, Arterial: 75 mmHg — ABNORMAL LOW (ref 80.0–100.0)
pO2, Arterial: 82 mmHg (ref 80.0–100.0)

## 2010-12-22 LAB — POCT CARDIAC MARKERS
CKMB, poc: 4 ng/mL (ref 1.0–8.0)
Myoglobin, poc: 30.2 ng/mL (ref 12–200)
Troponin i, poc: <0.05 ng/mL (ref 0.00–0.09)

## 2010-12-22 LAB — PROTIME-INR: INR: 0.94 (ref 0.00–1.49)

## 2010-12-22 LAB — TSH: TSH: 0.719 u[IU]/mL (ref 0.350–4.500)

## 2011-01-05 LAB — CULTURE, RESPIRATORY W GRAM STAIN

## 2011-01-05 LAB — STREP PNEUMONIAE ANTIBODY SEROTYPES
Strep pneumo Type 19: 1.07 ug/mL
Strep pneumo Type 9: 0.24 ug/mL
Strep pneumoniae Type 18C Abs: 0.38 ug/mL
Strep pneumoniae Type 5 Abs: 4.6 ug/mL
Strep pneumoniae Type 6B Abs: 0.75 ug/mL
Strep pneumoniae Type 8 Abs: 0.43 ug/mL
Strep pneumoniae Type 9N Abs: 0.19 ug/mL

## 2011-01-05 LAB — BASIC METABOLIC PANEL
Chloride: 99 mEq/L (ref 96–112)
GFR calc Af Amer: >60 mL/min (ref >60)
GFR calc non Af Amer: >60 mL/min (ref >60)
Potassium: 4.3 mEq/L (ref 3.5–5.1)
Sodium: 141 mEq/L (ref 135–145)

## 2011-01-05 LAB — CULTURE, BLOOD (ROUTINE X 2)

## 2011-01-05 LAB — CBC
HCT: 40.4 % (ref 36.0–46.0)
Hemoglobin: 11.9 g/dL — ABNORMAL LOW (ref 12.0–15.0)
Hemoglobin: 12.9 g/dL (ref 12.0–15.0)
MCHC: 32.3 g/dL (ref 30.0–36.0)
MCHC: 32.6 g/dL (ref 30.0–36.0)
MCHC: 33.3 g/dL (ref 30.0–36.0)
MCV: 90.2 fL (ref 78.0–100.0)
MCV: 91.2 fL (ref 78.0–100.0)
MCV: 91.8 fL (ref 78.0–100.0)
MCV: 92.4 fL (ref 78.0–100.0)
Platelets: 271 10*3/uL (ref 150–400)
Platelets: 320 10*3/uL (ref 150–400)
RBC: 4.09 MIL/uL (ref 3.87–5.11)
RBC: 4.37 MIL/uL (ref 3.87–5.11)
RBC: 4.67 MIL/uL (ref 3.87–5.11)
RDW: 18.2 % — ABNORMAL HIGH (ref 11.5–15.5)
WBC: 14.7 10*3/uL — ABNORMAL HIGH (ref 4.0–10.5)

## 2011-01-05 LAB — DRUGS OF ABUSE SCREEN W/O ALC, ROUTINE URINE
Benzodiazepines.: NEGATIVE
Cocaine Metabolites: NEGATIVE
Creatinine,U: 107.3 mg/dL
Methadone: NEGATIVE
Opiate Screen, Urine: NEGATIVE

## 2011-01-05 LAB — HIV ANTIBODY (ROUTINE TESTING W REFLEX): HIV: NONREACTIVE

## 2011-01-05 LAB — BLOOD GAS, ARTERIAL
Acid-Base Excess: 8 mmol/L — ABNORMAL HIGH (ref 0.0–2.0)
Drawn by: 26627
Patient temperature: 98.6
pCO2 arterial: 57.4 mmHg (ref 35.0–45.0)
pH, Arterial: 7.38 (ref 7.350–7.400)

## 2011-01-05 LAB — DIFFERENTIAL
Basophils Absolute: 0.1 10*3/uL (ref 0.0–0.1)
Basophils Relative: 0 % (ref 0–1)
Eosinophils Absolute: 0 10*3/uL (ref 0.0–0.7)
Monocytes Relative: 5 % (ref 3–12)
Neutro Abs: 20.3 10*3/uL — ABNORMAL HIGH (ref 1.7–7.7)
Neutrophils Relative %: 89 % — ABNORMAL HIGH (ref 43–77)

## 2011-01-05 LAB — POCT I-STAT, CHEM 8
Creatinine, Ser: 0.7 mg/dL (ref 0.4–1.2)
Glucose, Bld: 113 mg/dL — ABNORMAL HIGH (ref 70–99)
Hemoglobin: 16.7 g/dL — ABNORMAL HIGH (ref 12.0–15.0)
TCO2: 30 mmol/L (ref 0–100)

## 2011-01-05 LAB — GLUCOSE, CAPILLARY
Glucose-Capillary: 199 mg/dL — ABNORMAL HIGH (ref 70–99)
Glucose-Capillary: 247 mg/dL — ABNORMAL HIGH (ref 70–99)

## 2011-01-05 LAB — LEGIONELLA ANTIGEN, URINE: Legionella Antigen, Urine: NEGATIVE

## 2011-01-05 LAB — EXPECTORATED SPUTUM ASSESSMENT W GRAM STAIN, RFLX TO RESP C

## 2011-02-15 NOTE — Letter (Signed)
June 23, 2010   Radene Gunning, MD, PhD  [address] [place], [place] [address]   Re:  [name], [name]               DOB:  [birth year]   Dear Jonny Ruiz:   I saw the patient back today after we placed fiducial with endobronchial  ultrasound.  This was a very difficult placement because her lesion was  located so far posteriorly and no bronchus would really make the turn to  get there.  We did end up placing one distally in the bronchus as we  could approximately 3 cm from the lesion.  I saw her back today and she  was called from the anesthesia.  Her chest x-ray today unfortunately  shows that she has coughed up her fiducial, so I will just call you and  discuss this with you.  I am sorry we cannot get a better result.   Sincerely,   Ines Bloomer, M.D.  Electronically Signed   DPB/MEDQ  D:  06/23/2010  T:  06/24/2010  Job:  [id number]

## 2011-02-15 NOTE — H&P (Signed)
NAMELAURIANN, MILILLO               ACCOUNT NO.:  [id number]   MEDICAL RECORD NO.:  [id number]          PATIENT TYPE:  EMS   LOCATION:  MAJO                         FACILITY:  [facility]   PHYSICIAN:  Leighton Roach McDiarmid, M.D.DATE OF BIRTH:  07-08-1956   DATE OF ADMISSION:  07/21/2007  DATE OF DISCHARGE:                              HISTORY & PHYSICAL   CHIEF COMPLAINT:  Chest pain and cough.   PRIMARY CARE PHYSICIAN:  None.   HISTORY OF PRESENT ILLNESS:  This is a 55 year old female with chest  pain and cough for 2 weeks.  The cough was worse on Wednesday and at  that time, was accompanied by leg pain.  The chest pain is nonexertional  and is worse with smoking and bending down.  The pain radiates to the  left arm and is associated with a headache and dizziness on standing but  without sweating.  The patient has had fever and chills, decreased p.o.  intake secondary to vomiting when eating.  The vomitus is nonbloody and  nonbilious.  Also there is a sick contact at home, the boyfriend of the  patient has a URI at this time.   PAST MEDICAL HISTORY:  Bilateral tubal ligation and 1 previous  hospitalization for similar episode.   FAMILY HISTORY:  No heart disease, no cancer, no clots.   SOCIAL HISTORY:  The patient is a heavy drinker who admits to drinking  in excess of a 12-pack of beer per day.  Last drink was on Monday.  Also  admits to cannabis abuse and smoking 1 pack of cigarettes per day.  She  is a Occupational psychologist at [employer] and lives with her boyfriend who  accompanies her today.   MEDICATIONS:  None.   ALLERGIES:  No known drug allergies.   REVIEW OF SYSTEMS:  Per HPI.   PHYSICAL EXAMINATION:  VITAL SIGNS:  Temperature 99.9, pulse 119,  respirations 28, blood pressure 132/85, saturations 92% on room air.  However, observed to have saturations of 99% on 15 liters of oxygen and  desaturating to 88% when the oxygen was removed.  GENERAL:  Alert and oriented, mild  distress, cachectic.  CARDIOVASCULAR:  Regular rate and rhythm, no murmurs, rubs or gallops.  PULMONARY:  Increased work of breathing, right upper and lower rhonchi.  ABDOMEN:  Positive bowel sounds.  Mild right upper quadrant tenderness.  EXTREMITIES:  Nontender, no edema bilateral lower extremities.  NEUROLOGIC:  No tremor.  Extraocular muscles intact.  Pupils equally  round and reactive to light.  HEENT:  White plaque on tongue.  Moist mucous membranes.  RECTAL:  Prolapsed internal hemorrhoid, nonbleeding, hemoccult negative.   LABORATORIES AND STUDIES:  CMP:  Sodium 129, potassium 3.7, chloride 91,  bicarb 27, BUN 28, creatinine 0.55, glucose 113, calcium 9.1, total  protein 7.0, albumin 3.1, AST 16, ALT 14, alkaline phosphatase 167,  total bilirubin 0.9.   Chest x-ray demonstrated right upper lobe and right lower lobe  pneumonia.   EKG with sinus tachycardia with normal ST and T.   ASSESSMENT AND PLAN:  This is a 55 year old female with  chest pain and  right pneumonia.   1. Community acquired pneumonia:  Port score for this patient is 27      indicating class 3 which justifies a brief hospital admission.      Also there is some concern for immunocompromise given the cachexia      and oral plaque.  We will treat with ceftriaxone 1 gram IV daily      and azithromycin 500 mg IV daily.  The patient has baseline COPD      per the chest x-ray so we will also treat with Atrovent and      albuterol nebulizer.  2. Chest pain:  We will repeat the EKG now and cycle cardiac enzymes      x2.  The Wells criteria give her a low risk for DVT and we will not      do the D-dimer in the context of acute infection as false positives      may occur.  3. Oral plaque and cachexia:  There is some concern for HIV versus      neoplasm.  There is no lung cancer visible on chest x-ray, though      she clearly is at high risk for this given her smoking history.  We      will draw an HIV test at this  time.  4. Alcohol abuse:  The patient admits to being a heavy drinker.  We      will monitor for signs and symptoms of withdrawal, though there are      none at this time.  Of note, the patient is tachycardic but this      could be accounted for by the albuterol treatment.  Patient is      given thiamine and folate in the emergency department and we will      not give an Ativan taper for now.  No drink since Monday per      patient but we will monitor closely.  We will also draw a urine      drug screen at this time.  5. Tobacco abuse:  A 21 mg patch daily of nicotine to prevent      withdrawal.  6. Tachycardia:  Possibly secondary to albuterol versus hypovolemia.      We will replace fluids and monitor.  7. Hyponatremia:  The patient had a sodium of 129 on admission.  We      will replace fluids with normal saline at a rate of 125 mL per hour      and recheck the BMP in the morning.  8. Disposition:  Pending the patient's pulmonary status.      Romero Belling, MD  Electronically Signed      Leighton Roach McDiarmid, M.D.  Electronically Signed    MO/MEDQ  D:  07/21/2007  T:  07/22/2007  Job:  [id number]

## 2011-02-15 NOTE — Discharge Summary (Signed)
[name], Karina Weber NO.:  [id number]   MEDICAL RECORD NO.:  [id number]          PATIENT TYPE:  INP   LOCATION:  5004                         FACILITY:  [facility]   PHYSICIAN:  Karina Ramp, MD        DATE OF BIRTH:  Sep 29, 1956   DATE OF ADMISSION:  07/21/2007  DATE OF DISCHARGE:  07/31/2007                               DISCHARGE SUMMARY   PRIMARY CARE PHYSICIAN:  None; however, the patient has elected to be  seen at the [facility] by Dr. Romero Weber after  discharge.   DISCHARGE DIAGNOSES:  1. Community acquired pneumonia.  2. Chronic obstructive pulmonary disease.  3. Alcohol abuse.  4. Tobacco abuse.   DISCHARGE MEDICATIONS:  1. Advair Diskus 100/50 inhaled twice a day.  2. Albuterol 90 mcg spray 1 to 2 puffs every 4 to 6 hours as needed      for shortness of breath.  3. Multivitamin once every day.   CONSULTATIONS:  1. Social work.  2. Critical care.   PROCEDURE:  1. Chest x-ray on July 21, 2007.  2. Portable chest x-ray x8 between the dates of July 23, 2007 and      July 28, 2007.  3. CT chest angiogram on July 28, 2007.   LABORATORY DATA:  She presented with an alcohol level less than 5 and a  drug screen positive for marijuana only.  Troponin I was negative x2 on  admission.  HIV nonreactive.  Urine legionella antigen negative.  PT  12.7 with an INR of 0.9 on July 23, 2007.  Brain natriuretic peptide  on July 23, 2007, 170.  TSH on July 23, 2007, 0.244.  Urine  culture positive for Viridans Streptococcus.  Free T4 on July 24, 2007, 0.80.  Free T3 on July 24, 2007, 1.5.  BMP on date of  admission, sodium 139, potassium 4.2, chloride 99, bicarb 35, BUN 14,  creatinine 0.51, glucose 88, calcium 9.2.  Magnesium level on July 25, 2007, was 2.1.  Blood cultures were negative x2, final read.   BRIEF HOSPITAL COURSE:  1. This is a 55 year old female who was admitted on July 21, 2007,  with a community acquired pneumonia.  She was treated with      ceftriaxone 1 gram IV q.24 hours and throughout this stay this was      advanced to 2 gram IV q.24 hours.  She received a total of 11 days      of treatment of this.  Her pneumonia resolved throughout her stay.  2. COPD.  The patient is undiagnosed with COPD; however, it is      suggested by her smoking history and her chest x-ray.  She was      treated with albuterol, budesonide, ipratropium, and oral steroids      throughout this admission.  The patient is requiring O2 with sats      less than 88 when she is off O2 and walking around.  We will write      for home oxygen.  Have advised  patient to not smoke near the oxygen      tank, the patient understands that there is a high danger of fire      with smoking around the oxygen.  3. Alcohol abuse.  The patient has an extensive alcohol history with      heavy daily drinking and almost no other p.o. intake other than      alcohol.  She was put on an Ativan taper originally, but this was      stopped because the patient had an altered mental status and had no      other signs or symptoms of alcohol withdrawal throughout this      admission.  She spoke with social work regarding alcohol rehab      services and is desiring to quit.  Her partner is supportive of her      sobriety and she will be seeking outpatient treatment when she goes      home.  She is going home to a home with no other drinkers.  4. Tobacco abuse.  The patient was given a nicotine patch throughout      this admission and was given smoking cessation counseling.      Discussed the importance of quitting tobacco with the patient and      reinforced that she should not smoke near her oxygen tank.  The      patient is agreeable to this.  5. UTI.  The patient was treated with ceftriaxone through this      admission, which should cover the Strep Viridans.  She was      asymptomatic on discharge and afebrile.  She  received a total of 11      days of treatment for this.   DISCHARGE INSTRUCTIONS:  The patient is discharged to home with no  restrictions on activity or diet.  She is advised to stop drinking  alcohol and to please stop smoking.  As noted up above, the patient will  seek outpatient treatment for alcohol abuse, and I discussed  prioritizing her alcohol and tobacco cessation.  Saying that if she felt  the need to stop one or the other first and then work on the other, that  she should prioritize for stop drinking.  The patient understands this,  and we will followup with smoking cessation in the long-term.  The  patient will be returning to a home environment that she describes as  safe and stable with her long-time boyfriend, Karina Weber, who is a  retired Hydrographic surveyor.  Karina Weber will return to her job at  [employer], where she works 5 to 6 days a week.  I have written orders  for home oxygen and home health PT and RN.  Followup appointment has  been scheduled with Dr. Romero Weber at the [facility] on September 04, 2007, at 2 p.m.   CONDITION ON DISCHARGE:  The patient was discharged to home in stable  condition.      Karina Belling, MD  Electronically Signed      Karina Ramp, MD  Electronically Signed    MO/MEDQ  D:  07/31/2007  T:  07/31/2007  Job:  [id number]

## 2011-02-18 NOTE — Discharge Summary (Signed)
[place]  Patient:    Karina Weber, Karina Weber Visit Number: [id number] MRN: [id number]          Service Type: MED Location: [id number] Attending Physician:  Phifer, Trinna Post Dictated by:   Ladell Pier, M.D. Admit Date:  09/15/2001 Discharge Date: 09/18/2001                             Discharge Summary  DISCHARGE DIAGNOSES: 1. Pneumonia. 2. Pleuritic chest pain. 3. Hyponatremia. 4. Insomnia. 5. Urinary tract infection. 6. Dysphagia. 7. Elevated liver function tests. 8. Alcohol abuse. 9. Bilateral tubal ligation.  DISCHARGE MEDICATIONS:  Tequin 400 mg p.o. q.d. x 6 days.  FOLLOW-UP:  Follow up with Dr. Olena Leatherwood on October 22, 2001, at 2 p.m.  CONSULTATIONS:  None.  PROCEDURES:  None.  HISTORY OF PRESENT ILLNESS:  [name] is a 55 year old female with no significant past medical history.  She stated that for the past day while she was at work she started having chest pain in the left side of her chest that spread to the left side of her back and over her whole body.  The pain was needle-sharp, was about a 10/10.  She took nothing for the pain.  Intensity of the pain did not change with movement or breathing.  It increased when she laid on her left side.  She has not been able to sleep.  No shortness of breath.  Positive for nausea and vomiting x2.  No diaphoresis.  Positive for chills.  She has also been coughing, productive of white mucous.  Does complain of two-pillow orthopnea x1 day.  PND x1 day.  Decreased appetite, with some cold intolerance.  PAST MEDICAL HISTORY:  Bilateral tubal ligation.  FAMILY HISTORY:  Did not know her father or mother had lung cancer.  She is an only child.  SOCIAL HISTORY:  She smokes a pack a day since she was 55 years old.  She works at [employer].  Has two boys, one 13 and the other 48, and a 68-year-old grandbaby.  No brothers, no sisters.  MEDICATIONS:  None.  ALLERGIES:   None.  REVIEW OF SYSTEMS:  As stated in HPI.  PHYSICAL EXAMINATION:  VITAL SIGNS:  Temperature 98.6, blood pressure 102/75, pulse 120, respirations 18, pulse oximetry 88% on room air.  GENERAL:  Sitting up in bed, in no acute distress.  HEENT:  Head normocephalic, atraumatic.  Pupils are equal, round, and reactive to light.  Throat, no erythema.  CARDIOVASCULAR:  Tachycardic, regular rhythm.  No murmurs, rubs, or gallops. S1, S2 constant intensity.  LUNGS:  Decreased breath sounds throughout.  Tender to touch in the left anterior and posterior lung fields.  ABDOMEN:  Soft and nontender.  Positive bowel sounds.  Nondistended.  RECTAL:  Deferred.  EXTREMITIES:  No edema.  With 2+ DP pulses bilaterally.  LYMPH:  No lymphadenopathy.  NEUROLOGIC:  Cranial nerves II-XII intact.  Strength 5/5 throughout.  HOSPITAL COURSE: #1 - PNEUMONIA:  The patient most likely had pneumonia, based on chest CT radiologic findings.  In addition, the patient had increased wbcs.  White blood cell count with left, and she did have some pleuritic pain in the left side of her chest.  That is probably most likely due to pneumonia extending to the pleural space causing irritation of the pleura.  The patient had no risk factors for PE.  The chest pain did not sound  cardiac in etiology.  EKG was negative.  A set of cardiac enzymes was done which was negative.  She does not have a history of reflux, even though she complained of some dysphagia.  She was given a GI cocktail and started on Protonix.  She has no history of NSAID use.  The patient should follow up as an outpatient to repeat her CT of the chest because of infiltrate, per the radiologist.  The left upper lobe infiltrate is most consistent with pneumonia, but bronchoalveolar carcinoma cannot be excluded.  After antibiotic treatment is completed the patient should follow up for a repeat CT of the chest.  Inpatient, the patient was treated with  Tequin IV, blood gas was done, and blood and sputum cultures and UA, which all were negative.  Her hyponatremia resolved with treatment of IV fluids.  Her pleuritic pain was treated with Vicodin.  #2 - URINARY TRACT INFECTION:  Her UA showed that she had a urinary tract infection, and that was covered with Tequin.  #3 - RIGHT UPPER QUADRANT PAIN, EPIGASTRIC TENDERNESS:  Hepatic panel was done, and a right upper quadrant ultrasound was done.  Her hepatic panel showed that she had 2:1 ALT/AST ratio.  The AST/ALT was not really that much elevated.  Her ratio is consistent with her alcoholic use.  A right upper quadrant ultrasound was done, which was negative for cholecystitis.  #4 - DYSPHAGIA:  The patient complained of dysphagia.  She was given a GI cocktail, treated with Protonix, and she had a barium swallow done.  This showed a small sliding-type hiatal hernia, but no evidence of reflux, without other significant findings.  DISCHARGE LABORATORY DATA:  Chest x-ray showed 5 cm mass/infiltrate in the left middle lobe.  Chest CT showed left upper lobe infiltrate with air bronchogram most consistent with pneumonia.  Other possibilities include hemorrhage, hemorrhagic infarct, bronchoalveolar cancer.  Her barium swallow showed small sliding hiatal hernia, but no evidence of reflux.  No other significant disease.  Ultrasound of the abdomen showed echogenic kidneys, right greater than left, without hydronephrosis, which is a nonspecific finding but raises the possibility of underlying parenchymal disease such as HIV nephropathy or other medical renal disease.  Chest CT showed dense peripheral infiltrates with air bronchograms within the left upper lobe extending to the pleura ______ surface.  The appearance would be most consistent with a pneumonic infiltrate.  If the patient does not have symptoms suggesting pneumonia, other possibilities would be hemorrhage, bronchoalveolar cell carcinoma,  or peripheral infarcts.  Also with some changes of COPD.   The pH was 7.45, pCO2 40, pO2 78, bicarbonate 28, percent saturations 96%. WBC on discharge 6.2, hemoglobin 11.9, hematocrit 35.8, platelets 251, MCV 94.1, 77% neutrophils.  Sodium 142, potassium 4.0, chloride 99, CO2 33, glucose 104, BUN 5, creatinine 0.5, calcium 9.5.  Total protein 5.8, albumin 2.8, AST 44, ALT 18, alkaline phosphatase 142, total bilirubin 1.1, lipase 35. Cardiac enzymes negative.  Hepatitis B negative, hepatitis C negative.  HIV nonreactive.  UA had small hemoglobin, 15 ketones, 30 protein, nitrite positive, trace leukocyte esterase, few bacteria.  Blood cultures, one of two showed diphtheroids.  Sputum culture showed normal oropharyngeal flora. Dictated by:   Ladell Pier, M.D. Attending Physician:  Phifer, Trinna Post DD:  10/12/01 TD:  10/14/01 Job: [id number]

## 2011-03-09 ENCOUNTER — Emergency Department (HOSPITAL_COMMUNITY): Payer: Medicaid Other

## 2011-03-09 ENCOUNTER — Encounter: Payer: Self-pay | Admitting: Internal Medicine

## 2011-03-09 ENCOUNTER — Inpatient Hospital Stay (HOSPITAL_COMMUNITY)
Admission: EM | Admit: 2011-03-09 | Discharge: 2011-03-15 | DRG: 191 | Disposition: A | Discharge status: Home / Self Care | Payer: Medicaid Other | Attending: Internal Medicine | Admitting: Internal Medicine

## 2011-03-09 DIAGNOSIS — R0902 Hypoxemia: Secondary | ICD-10-CM

## 2011-03-09 DIAGNOSIS — R748 Abnormal levels of other serum enzymes: Secondary | ICD-10-CM | POA: Diagnosis present

## 2011-03-09 DIAGNOSIS — C3430 Malignant neoplasm of lower lobe, unspecified bronchus or lung: Secondary | ICD-10-CM | POA: Diagnosis present

## 2011-03-09 DIAGNOSIS — I10 Essential (primary) hypertension: Secondary | ICD-10-CM | POA: Diagnosis present

## 2011-03-09 DIAGNOSIS — N281 Cyst of kidney, acquired: Secondary | ICD-10-CM | POA: Diagnosis present

## 2011-03-09 DIAGNOSIS — J441 Chronic obstructive pulmonary disease with (acute) exacerbation: Principal | ICD-10-CM | POA: Diagnosis present

## 2011-03-09 DIAGNOSIS — F17200 Nicotine dependence, unspecified, uncomplicated: Secondary | ICD-10-CM | POA: Diagnosis present

## 2011-03-09 DIAGNOSIS — E874 Mixed disorder of acid-base balance: Secondary | ICD-10-CM | POA: Diagnosis present

## 2011-03-09 DIAGNOSIS — J988 Other specified respiratory disorders: Secondary | ICD-10-CM | POA: Diagnosis present

## 2011-03-09 DIAGNOSIS — D509 Iron deficiency anemia, unspecified: Secondary | ICD-10-CM | POA: Diagnosis present

## 2011-03-09 DIAGNOSIS — J398 Other specified diseases of upper respiratory tract: Secondary | ICD-10-CM | POA: Diagnosis present

## 2011-03-09 DIAGNOSIS — M79609 Pain in unspecified limb: Secondary | ICD-10-CM | POA: Diagnosis not present

## 2011-03-09 LAB — CARDIAC PANEL(CRET KIN+CKTOT+MB+TROPI)
CK, MB: 4.6 ng/mL — ABNORMAL HIGH (ref 0.3–4.0)
CK, MB: 4.1 ng/mL — ABNORMAL HIGH (ref 0.3–4.0)
Relative Index: INVALID (ref 0.0–2.5)
Relative Index: INVALID (ref 0.0–2.5)
Total CK: 88 U/L (ref 7–177)
Total CK: 78 U/L (ref 7–177)
Troponin I: <0.3 ng/mL (ref <0.30)

## 2011-03-09 LAB — COMPREHENSIVE METABOLIC PANEL
AST: 20 U/L (ref 0–37)
Albumin: 3.4 g/dL — ABNORMAL LOW (ref 3.5–5.2)
BUN: 12 mg/dL (ref 6–23)
Creatinine, Ser: <0.47 mg/dL (ref 0.4–1.2)
Total Protein: 7 g/dL (ref 6.0–8.3)

## 2011-03-09 LAB — LIPID PANEL
Cholesterol: 153 mg/dL (ref 0–200)
HDL: 91 mg/dL
Total CHOL/HDL Ratio: 1.7 ratio
Triglycerides: 72 mg/dL
VLDL: 14 mg/dL (ref 0–40)

## 2011-03-09 LAB — DIFFERENTIAL
Basophils Absolute: 0 10*3/uL (ref 0.0–0.1)
Basophils Relative: 0 % (ref 0–1)
Eosinophils Absolute: 0 10*3/uL (ref 0.0–0.7)
Monocytes Relative: 9 % (ref 3–12)
Neutro Abs: 6.6 10*3/uL (ref 1.7–7.7)
Neutrophils Relative %: 78 % — ABNORMAL HIGH (ref 43–77)

## 2011-03-09 LAB — ETHANOL

## 2011-03-09 LAB — CBC
Hemoglobin: 11.6 g/dL — ABNORMAL LOW (ref 12.0–15.0)
MCH: 27.7 pg (ref 26.0–34.0)
Platelets: 407 10*3/uL — ABNORMAL HIGH (ref 150–400)
RBC: 4.19 MIL/uL (ref 3.87–5.11)
WBC: 8.4 10*3/uL (ref 4.0–10.5)

## 2011-03-09 LAB — PRO B NATRIURETIC PEPTIDE: Pro B Natriuretic peptide (BNP): 2001 pg/mL — ABNORMAL HIGH (ref 0–125)

## 2011-03-09 LAB — TSH: TSH: 0.862 u[IU]/mL (ref 0.350–4.500)

## 2011-03-09 LAB — IRON: Iron: 17 ug/dL — ABNORMAL LOW (ref 42–135)

## 2011-03-09 LAB — HIV ANTIBODY (ROUTINE TESTING W REFLEX): HIV: NONREACTIVE

## 2011-03-09 MED ORDER — IOHEXOL 300 MG/ML  SOLN
50.0000 mL | Freq: Once | INTRAMUSCULAR | Status: AC | PRN
  Administered 2011-03-09: 50 mL via INTRAVENOUS

## 2011-03-09 NOTE — Progress Notes (Unsigned)
Hospital Admission Note Date: 03/09/2011  Patient name: Karina Weber Medical record number: [id number] Date of birth: 12-Feb-1956 Age: 55 y.o. Gender: female PCP: PRIBULA,CHRISTOPHER, MD  Medical Service: IMTS  Attending physician: Dr. Margarito Liner   Resident [phone]): Dr. Scot Dock   Pager: [phone] Resident (R1): Dr. Saralyn Pilar  Pager: [phone]  Chief Complaint: shortness of breath  History of Present Illness: 55 yo female with non small cell lung cancer who presents to [facility] ED with main concern of sudden onset shortness of breath that started 1 day prior to admission and was associated with wheezing and productive cough of white sputum. She reports running out of her inhaler and continues to smoke daily. She also reports intermittent bilateral lower extremity swelling over the past 2-3 months. She denies any fevers, chills, chest pain, abdominal or urinary concerns. She also denies any weakness, dizziness, recent sicknesses, hospitalizations, sick contacts, or traveling. Per ED report pt had one episode of desaturating down to 88%.   Current Outpatient Prescriptions  Medication Sig Dispense Refill  . albuterol (PROVENTIL HFA;VENTOLIN HFA) 108 (90 BASE) MCG/ACT inhaler Inhale 2 puffs into the lungs every 6 (six) hours as needed.        Marland Kitchen aspirin 81 MG tablet Take 81 mg by mouth daily.        . Fluticasone-Salmeterol (ADVAIR) 250-50 MCG/DOSE AEPB Inhale 1 puff into the lungs every 12 (twelve) hours.        . hydrochlorothiazide 25 MG tablet Take 25 mg by mouth daily.        Marland Kitchen ipratropium (ATROVENT) 0.02 % nebulizer solution Take 500 mcg by nebulization 4 (four) times daily.        . metoprolol tartrate (LOPRESSOR) 25 MG tablet Take 25 mg by mouth 2 (two) times daily.        . nicotine (NICODERM CQ) 7 mg/24hr Place 1 patch onto the skin daily.         Facility-Administered Medications Ordered in Other Visits  Medication Dose Route Frequency Provider Last Rate Last Dose  . iohexol  (OMNIPAQUE) 300 MG/ML injection 50 mL  50 mL Intravenous Once PRN Medication Radiologist   50 mL at 03/09/11 4401    Allergies: NKDA  Past Medical History  Diagnosis Date  . COPD (chronic obstructive pulmonary disease)   . Hypertension   . Alcoholism   . Tobacco abuse   . Lung cancer     Non small cell- per path 05/2010 results consistent with non small cell carcinoma  . Depression     No past surgical history on file.  Family History  Problem Relation Age of Onset  . Heart disease Neg Hx     History   Social History  . Marital Status: Single    Social History Main Topics  . Smoking status: Current Everyday Smoker -- 0.5 packs/day    Types: Cigarettes   Comment: Cut back from 2 ppd when diagnosed with Lung cancer.  . Alcohol Use: No     Former alcoholic  . Drug Use: No    Review of Systems: Per HPI  Physical Exam: T = 98.9 F BP = 140/85 RR = 21/min HR = 90/min O2 sat = 92% on RA  Constitutional: Vital signs reviewed.  Patient is in no acute distress and cooperative with exam. Alert and oriented x3.  Head: Normocephalic and atraumatic Ear: TM normal bilaterally Mouth: no erythema or exudates, MMM Eyes: PERRL, EOMI, conjunctivae normal, muddy sclera. Neck: Supple, Trachea midline normal ROM, No  JVD, mass, thyromegaly, or carotid bruit present.  Cardiovascular: RRR, S1 normal, S2 normal, no MRG, pulses symmetric and intact bilaterally Pulmonary/Chest: mild expiratory wheezing, no crackles, no rhonchi, no rales Abdominal: Soft. Non-tender, non-distended, bowel sounds are normal, no masses, organomegaly, or guarding present.  GU: no CVA tenderness Musculoskeletal: No joint deformities, erythema, or stiffness, ROM full and no nontender Neurological: A&O x3, Strenght is normal and symmetric bilaterally, cranial nerve II-XII are grossly intact, no focal motor deficit, sensory intact to light touch bilaterally.  Skin: Warm, dry and intact. No rash, cyanosis, or  clubbing.  Ext: bilateral +1 pitting edema, no cyanosis  Lab results:  CBC:    Component Value Date/Time   WBC 8.4 03/09/2011 0424   HGB 11.6* 03/09/2011 0424   HCT 37.0 03/09/2011 0424   PLT 407* 03/09/2011 0424   MCV 88.3 03/09/2011 0424   NEUTROABS 6.6 03/09/2011 0424   LYMPHSABS 1.0 03/09/2011 0424   MONOABS 0.8 03/09/2011 0424   EOSABS 0.0 03/09/2011 0424   BASOSABS 0.0 03/09/2011 0424   Comprehensive Metabolic Panel:    Component Value Date/Time   NA 138 03/09/2011 0424   K 5.0 03/09/2011 0424   CL 100 03/09/2011 0424   CO2 28 03/09/2011 0424   BUN 12 03/09/2011 0424   CREATININE <0.47 03/09/2011 0424   GLUCOSE 96 03/09/2011 0424   CALCIUM 9.0 03/09/2011 0424   AST 20 03/09/2011 0424   ALT 12 03/09/2011 0424   ALKPHOS 160* 03/09/2011 0424   BILITOT 0.1* 03/09/2011 0424   PROT 7.0 03/09/2011 0424   ALBUMIN 3.4* 03/09/2011 0424     Beta Natriuretic Peptide                 2001.0     h      0-125            Pg/mL  D-Dimer, Fibrin Derivatives              3.48       h      0.00-0.48     Imaging results:   CTA lungs:   No evidence of significant pulmonary embolus.  Right upper lung   mass is decreased in size consistent with response to therapy.   Stable appearance of pretracheal lymph nodes.  Diffuse   emphysematous changes.  No active consolidation.  CXR   Emphysema.  No evidence of active pulmonary disease. Previously   demonstrated lung mass is not visualized on today's film.  Assessment & Plan by Problem:  1) Shortness of breath - worrisome for PNA, PE (given LE pain and swelling), or progressive lung cancer, COPD exacerbation. Cardiac etiology is also possible but pt denies chest pain. She is chronic smoker and continues to smoke daily.  PLAN: - admit to telemetry and monitor vitals - follow up on CTA of the chest to rule out PE - continue supportive care with nebulizers and other inhalers per her home regimen (Advair, Duoneb, Albuterol) - follow up on CE's, HIV - continue O2 via [place] as  indicated - pain control  2) Lung Cancer - pt has oncologist and is undergoing therapy. CXR suggestive of decreasing mass in the lung, likely response to therapy PLAN: - pain control - oxygen and nebulizers as needed for shortness of breath - encourage smoking cessation  3) HTN - continue metoprolol and HCTZ per home regimen  4) Alcohol abuse PLAN: - follow up on alcohol level test - UDS - place on CIWA protocal -  cont thiamine and folic acid  5) DVT prophylaxis - lovenox 40 mg sub Q daily  [employer]

## 2011-03-09 NOTE — Progress Notes (Unsigned)
Hospital Admission Note Date: 03/09/2011  Patient name: Karina Weber Medical record number: [id number] Date of birth: 02-17-1956 Age: 55 y.o. Gender: female PCP: PRIBULA,CHRISTOPHER, MD  Medical Service: IMTS  Attending physician: Dr. Margarito Liner   Resident [phone]): Dr. Scot Dock   Pager: [phone] Resident (R1): Dr. Saralyn Pilar  Pager: [phone]  Chief Complaint: shortness of breath  History of Present Illness: 55 yo female with PMH outlined below who presents to [facility] ED with main concern of sudden onset shortness of breath   Current Outpatient Prescriptions  Medication Sig Dispense Refill  . albuterol (PROVENTIL HFA;VENTOLIN HFA) 108 (90 BASE) MCG/ACT inhaler Inhale 2 puffs into the lungs every 6 (six) hours as needed.        Marland Kitchen aspirin 81 MG tablet Take 81 mg by mouth daily.        . Fluticasone-Salmeterol (ADVAIR) 250-50 MCG/DOSE AEPB Inhale 1 puff into the lungs every 12 (twelve) hours.        . hydrochlorothiazide 25 MG tablet Take 25 mg by mouth daily.        Marland Kitchen ipratropium (ATROVENT) 0.02 % nebulizer solution Take 500 mcg by nebulization 4 (four) times daily.        . metoprolol tartrate (LOPRESSOR) 25 MG tablet Take 25 mg by mouth 2 (two) times daily.        . nicotine (NICODERM CQ) 7 mg/24hr Place 1 patch onto the skin daily.         Facility-Administered Medications Ordered in Other Visits  Medication Dose Route Frequency Provider Last Rate Last Dose  . iohexol (OMNIPAQUE) 300 MG/ML injection 50 mL  50 mL Intravenous Once PRN Medication Radiologist   50 mL at 03/09/11 4696    Allergies: NKDA  Past Medical History  Diagnosis Date  . COPD (chronic obstructive pulmonary disease)   . Hypertension   . Alcoholism   . Tobacco abuse   . Lung cancer     Non small cell  . Depression     No past surgical history on file.  Family History  Problem Relation Age of Onset  . Heart disease Neg Hx     History   Social History  . Marital Status: Single    Social History  Main Topics  . Smoking status: Current Everyday Smoker -- 0.5 packs/day    Types: Cigarettes   Comment: Cut back from 2 ppd when diagnosed with Lung cancer.  . Alcohol Use: No     Former alcoholic  . Drug Use: No    Social History Narrative  . No narrative on file    Review of Systems: Per HPI  Physical Exam:  BP =  RR =  HR =  O2 sat =   Constitutional: Vital signs reviewed.  Patient is a well-developed and well-nourished  in no acute distress and cooperative with exam. Alert and oriented x3.  Head: Normocephalic and atraumatic Ear: TM normal bilaterally Mouth: no erythema or exudates, MMM Eyes: PERRL, EOMI, conjunctivae normal, No scleral icterus.  Neck: Supple, Trachea midline normal ROM, No JVD, mass, thyromegaly, or carotid bruit present.  Cardiovascular: RRR, S1 normal, S2 normal, no MRG, pulses symmetric and intact bilaterally Pulmonary/Chest: CTAB, no wheezes, rales, or rhonchi Abdominal: Soft. Non-tender, non-distended, bowel sounds are normal, no masses, organomegaly, or guarding present.  GU: no CVA tenderness Musculoskeletal: No joint deformities, erythema, or stiffness, ROM full and no nontender Hematology: no cervical, inginal, or axillary adenopathy.  Neurological: A&O x3, Strenght is normal and symmetric bilaterally, cranial  nerve II-XII are grossly intact, no focal motor deficit, sensory intact to light touch bilaterally.  Skin: Warm, dry and intact. No rash, cyanosis, or clubbing.  Psychiatric: Normal mood and affect. speech and behavior is normal. Judgment and thought content normal. Cognition and memory are normal.   Lab results:  CBC:    Component Value Date/Time   WBC 8.4 03/09/2011 0424   HGB 11.6* 03/09/2011 0424   HCT 37.0 03/09/2011 0424   PLT 407* 03/09/2011 0424   MCV 88.3 03/09/2011 0424   NEUTROABS 6.6 03/09/2011 0424   LYMPHSABS 1.0 03/09/2011 0424   MONOABS 0.8 03/09/2011 0424   EOSABS 0.0 03/09/2011 0424   BASOSABS 0.0 03/09/2011 0424    Comprehensive Metabolic Panel:    Component Value Date/Time   NA 138 03/09/2011 0424   K 5.0 03/09/2011 0424   CL 100 03/09/2011 0424   CO2 28 03/09/2011 0424   BUN 12 03/09/2011 0424   CREATININE <0.47 03/09/2011 0424   GLUCOSE 96 03/09/2011 0424   CALCIUM 9.0 03/09/2011 0424   AST 20 03/09/2011 0424   ALT 12 03/09/2011 0424   ALKPHOS 160* 03/09/2011 0424   BILITOT 0.1* 03/09/2011 0424   PROT 7.0 03/09/2011 0424   ALBUMIN 3.4* 03/09/2011 0424     Beta Natriuretic Peptide                 2001.0     h      0-125            Pg/mL  D-Dimer, Fibrin Derivatives              3.48       h      0.00-0.48     Imaging results:   CTA lungs:   No evidence of significant pulmonary embolus.  Right upper lung   mass is decreased in size consistent with response to therapy.   Stable appearance of pretracheal lymph nodes.  Diffuse   emphysematous changes.  No active consolidation.  CXR   Emphysema.  No evidence of active pulmonary disease. Previously   demonstrated lung mass is not visualized on today's film.  Assessment & Plan by Problem:

## 2011-03-09 NOTE — Progress Notes (Unsigned)
Hospital Admission Note Date: 03/09/2011  Patient name: MARKETTA VALADEZ Medical record number: [id number] Date of birth: 08-Jul-1956 Age: 55 y.o. Gender: female PCP: Leodis Sias, MD  Medical Service: IMTS  Attending physician: Dr. Margarito Liner  Pager: Resident [phone]): Dr. Scot Dock   Pager: Resident (R1): Dr. Saralyn Pilar  Pager:  Chief Complaint:  History of Present Illness:  No current facility-administered medications for this visit.   Current Outpatient Prescriptions  Medication Sig Dispense Refill  . albuterol (PROVENTIL HFA;VENTOLIN HFA) 108 (90 BASE) MCG/ACT inhaler Inhale 2 puffs into the lungs every 6 (six) hours as needed.        Marland Kitchen aspirin 81 MG tablet Take 81 mg by mouth daily.        . Fluticasone-Salmeterol (ADVAIR) 250-50 MCG/DOSE AEPB Inhale 1 puff into the lungs every 12 (twelve) hours.        . hydrochlorothiazide 25 MG tablet Take 25 mg by mouth daily.        Marland Kitchen ipratropium (ATROVENT) 0.02 % nebulizer solution Take 500 mcg by nebulization 4 (four) times daily.        . metoprolol tartrate (LOPRESSOR) 25 MG tablet Take 25 mg by mouth 2 (two) times daily.        . nicotine (NICODERM CQ) 7 mg/24hr Place 1 patch onto the skin daily.         Facility-Administered Medications Ordered in Other Visits  Medication Dose Route Frequency Provider Last Rate Last Dose  . iohexol (OMNIPAQUE) 300 MG/ML injection 50 mL  50 mL Intravenous Once PRN Medication Radiologist        Allergies: Review of patient's allergies indicates no known allergies.  Past Medical History  Diagnosis Date  . COPD (chronic obstructive pulmonary disease)   . Hypertension   . Alcoholism   . Tobacco abuse   . Lung cancer     Non small cell  . Depression     No past surgical history on file.  Family History  Problem Relation Age of Onset  . Heart disease Neg Hx     History   Social History  . Marital Status: Single    Spouse Name: N/A    Number of Children: N/A  . Years of  Education: N/A   Occupational History  . Not on file.   Social History Main Topics  . Smoking status: Current Everyday Smoker -- 0.5 packs/day    Types: Cigarettes  . Smokeless tobacco: Not on file   Comment: Cut back from 2 ppd when diagnosed with Lung cancer.  . Alcohol Use: No     Former alcoholic  . Drug Use: No  . Sexually Active:    Other Topics Concern  . Not on file   Social History Narrative  . No narrative on file    Review of Systems: {Review Of Systems:30496}  Physical Exam:  There were no vitals filed for this visit. {female exam, choose systems:17926} {female exam, choose systems:17872}  Lab results:  CBC:    Component Value Date/Time   WBC 8.4 03/09/2011 0424   HGB 11.6* 03/09/2011 0424   HCT 37.0 03/09/2011 0424   PLT 407* 03/09/2011 0424   MCV 88.3 03/09/2011 0424   NEUTROABS 6.6 03/09/2011 0424   LYMPHSABS 1.0 03/09/2011 0424   MONOABS 0.8 03/09/2011 0424   EOSABS 0.0 03/09/2011 0424   BASOSABS 0.0 03/09/2011 0424   Comprehensive Metabolic Panel:    Component Value Date/Time   NA 138 03/09/2011 0424   K 5.0 03/09/2011 0424  CL 100 03/09/2011 0424   CO2 28 03/09/2011 0424   BUN 12 03/09/2011 0424   CREATININE <0.47 03/09/2011 0424   GLUCOSE 96 03/09/2011 0424   CALCIUM 9.0 03/09/2011 0424   AST 20 03/09/2011 0424   ALT 12 03/09/2011 0424   ALKPHOS 160* 03/09/2011 0424   BILITOT 0.1* 03/09/2011 0424   PROT 7.0 03/09/2011 0424   ALBUMIN 3.4* 03/09/2011 0424     Beta Natriuretic Peptide                 2001.0     h      0-125            Pg/mL  D-Dimer, Fibrin Derivatives              3.48       h      0.00-0.48     Imaging results:   Other results:  Assessment & Plan by Problem:

## 2011-03-10 LAB — BASIC METABOLIC PANEL
Chloride: 100 mEq/L (ref 96–112)
GFR calc non Af Amer: >60 mL/min (ref >60)
Glucose, Bld: 122 mg/dL — ABNORMAL HIGH (ref 70–99)
Potassium: 4.1 mEq/L (ref 3.5–5.1)
Sodium: 140 mEq/L (ref 135–145)

## 2011-03-10 LAB — CBC
HCT: 36.9 % (ref 36.0–46.0)
MCH: 27.5 pg (ref 26.0–34.0)
MCHC: 30.4 g/dL (ref 30.0–36.0)
MCV: 90.4 fL (ref 78.0–100.0)
RDW: 18.4 % — ABNORMAL HIGH (ref 11.5–15.5)
WBC: 8.4 10*3/uL (ref 4.0–10.5)

## 2011-03-10 LAB — URINE DRUGS OF ABUSE SCREEN W ALC, ROUTINE (REF LAB)
Amphetamine Screen, Ur: NEGATIVE
Benzodiazepines.: NEGATIVE
Cocaine Metabolites: NEGATIVE
Opiate Screen, Urine: NEGATIVE
Propoxyphene: NEGATIVE

## 2011-03-10 LAB — CARDIAC PANEL(CRET KIN+CKTOT+MB+TROPI)
CK, MB: 3.3 ng/mL (ref 0.3–4.0)
Total CK: 61 U/L (ref 7–177)
Total CK: 57 U/L (ref 7–177)

## 2011-03-11 ENCOUNTER — Inpatient Hospital Stay (HOSPITAL_COMMUNITY): Payer: Medicaid Other

## 2011-03-11 LAB — BASIC METABOLIC PANEL
Calcium: 8.7 mg/dL (ref 8.4–10.5)
Creatinine, Ser: <0.47 mg/dL (ref 0.4–1.2)

## 2011-03-11 LAB — CBC
MCH: 27.1 pg (ref 26.0–34.0)
MCHC: 29.8 g/dL — ABNORMAL LOW (ref 30.0–36.0)
Platelets: 439 10*3/uL — ABNORMAL HIGH (ref 150–400)

## 2011-03-12 DIAGNOSIS — R0902 Hypoxemia: Secondary | ICD-10-CM

## 2011-03-12 LAB — CBC
Platelets: 405 10*3/uL — ABNORMAL HIGH (ref 150–400)
RBC: 4.1 MIL/uL (ref 3.87–5.11)
WBC: 9.5 10*3/uL (ref 4.0–10.5)

## 2011-03-12 LAB — BASIC METABOLIC PANEL
CO2: 33 mEq/L — ABNORMAL HIGH (ref 19–32)
Calcium: 8.9 mg/dL (ref 8.4–10.5)
Chloride: 98 mEq/L (ref 96–112)
Sodium: 140 mEq/L (ref 135–145)

## 2011-03-13 ENCOUNTER — Inpatient Hospital Stay (HOSPITAL_COMMUNITY): Payer: Medicaid Other

## 2011-03-13 LAB — BLOOD GAS, ARTERIAL
Bicarbonate: 36.7 mEq/L — ABNORMAL HIGH (ref 20.0–24.0)
FIO2: 0.21 %
Patient temperature: 98.6
pH, Arterial: 7.381 (ref 7.350–7.400)
pO2, Arterial: 44.1 mmHg — ABNORMAL LOW (ref 80.0–100.0)

## 2011-03-13 LAB — CBC
HCT: 34.6 % — ABNORMAL LOW (ref 36.0–46.0)
MCV: 88.9 fL (ref 78.0–100.0)
Platelets: 404 10*3/uL — ABNORMAL HIGH (ref 150–400)
RBC: 3.89 MIL/uL (ref 3.87–5.11)
WBC: 10.6 10*3/uL — ABNORMAL HIGH (ref 4.0–10.5)

## 2011-03-13 LAB — BASIC METABOLIC PANEL
CO2: 40 mEq/L (ref 19–32)
Chloride: 98 mEq/L (ref 96–112)
Sodium: 142 mEq/L (ref 135–145)

## 2011-03-14 ENCOUNTER — Inpatient Hospital Stay (HOSPITAL_COMMUNITY): Payer: Medicaid Other

## 2011-03-14 DIAGNOSIS — M79609 Pain in unspecified limb: Secondary | ICD-10-CM

## 2011-03-14 DIAGNOSIS — J441 Chronic obstructive pulmonary disease with (acute) exacerbation: Secondary | ICD-10-CM

## 2011-03-14 LAB — FERRITIN: Ferritin: 15 ng/mL (ref 10–291)

## 2011-03-14 LAB — COMPREHENSIVE METABOLIC PANEL
ALT: 19 U/L (ref 0–35)
AST: 29 U/L (ref 0–37)
Alkaline Phosphatase: 107 U/L (ref 39–117)
Glucose, Bld: 107 mg/dL — ABNORMAL HIGH (ref 70–99)
Potassium: 4.3 mEq/L (ref 3.5–5.1)
Sodium: 138 mEq/L (ref 135–145)
Total Protein: 6 g/dL (ref 6.0–8.3)

## 2011-03-14 LAB — IRON AND TIBC: UIBC: >450 ug/dL

## 2011-03-14 LAB — FOLATE: Folate: 14.8 ng/mL

## 2011-03-14 LAB — CBC
Hemoglobin: 11.5 g/dL — ABNORMAL LOW (ref 12.0–15.0)
MCHC: 31 g/dL (ref 30.0–36.0)
RBC: 4.21 MIL/uL (ref 3.87–5.11)

## 2011-03-14 LAB — VITAMIN B12: Vitamin B-12: 454 pg/mL (ref 211–911)

## 2011-03-15 ENCOUNTER — Other Ambulatory Visit: Payer: Self-pay | Admitting: Internal Medicine

## 2011-03-15 ENCOUNTER — Encounter: Payer: Self-pay | Admitting: Internal Medicine

## 2011-03-15 DIAGNOSIS — R0902 Hypoxemia: Secondary | ICD-10-CM

## 2011-03-15 LAB — CBC
HCT: 35.9 % — ABNORMAL LOW (ref 36.0–46.0)
Platelets: 382 10*3/uL (ref 150–400)
RDW: 18.3 % — ABNORMAL HIGH (ref 11.5–15.5)
WBC: 10.5 10*3/uL (ref 4.0–10.5)

## 2011-03-15 LAB — BASIC METABOLIC PANEL
Chloride: 91 mEq/L — ABNORMAL LOW (ref 96–112)
Potassium: 3.7 mEq/L (ref 3.5–5.1)
Sodium: 140 mEq/L (ref 135–145)

## 2011-03-15 LAB — RETICULOCYTES
RBC.: 4.48 MIL/uL (ref 3.87–5.11)
Retic Count, Absolute: 67.2 10*3/uL (ref 19.0–186.0)
Retic Ct Pct: 1.5 % (ref 0.4–3.1)

## 2011-03-15 MED ORDER — HYDROCHLOROTHIAZIDE 25 MG PO TABS: 25.0000 mg | ORAL_TABLET | Freq: Every day | ORAL | Status: DC

## 2011-03-15 MED ORDER — ALBUTEROL SULFATE HFA 108 (90 BASE) MCG/ACT IN AERS: 2.0000 | INHALATION_SPRAY | Freq: Four times a day (QID) | RESPIRATORY_TRACT | Status: DC | PRN

## 2011-03-15 MED ORDER — METOPROLOL TARTRATE 25 MG PO TABS: 25.0000 mg | ORAL_TABLET | Freq: Two times a day (BID) | ORAL | Status: DC

## 2011-03-15 MED ORDER — ASPIRIN 81 MG PO TABS: 81.0000 mg | ORAL_TABLET | Freq: Every day | ORAL | Status: DC

## 2011-03-15 MED ORDER — FOLIC ACID 1 MG PO TABS: 1.0000 mg | ORAL_TABLET | Freq: Every day | ORAL | Status: DC

## 2011-03-15 MED ORDER — FLUTICASONE-SALMETEROL 250-50 MCG/DOSE IN AEPB: 1.0000 | INHALATION_SPRAY | Freq: Two times a day (BID) | RESPIRATORY_TRACT | Status: DC

## 2011-03-15 MED ORDER — THIAMINE HCL 100 MG PO TABS: 100.0000 mg | ORAL_TABLET | Freq: Every day | ORAL | Status: DC

## 2011-03-15 NOTE — Progress Notes (Signed)
Patient admitted with COPD exacerbation. Improved with Abx ( the course of which completed in the hospital), prednisone taper and nebs. Had significant o2 requirement in the hospital and was discharged on home oxygen. Pulmonology who were following the patient in the hospital are going to see her as outpatient.

## 2011-03-15 NOTE — Consult Note (Signed)
[name], Weber NO.:  [id number]  MEDICAL RECORD NO.:  [id number]  LOCATION:  6732                         FACILITY:  [facility]  PHYSICIAN:  Charlcie Cradle. Delford Field, MD, FCCPDATE OF BIRTH:  1956-06-28  DATE OF CONSULTATION:  03/14/2011 DATE OF DISCHARGE:                                CONSULTATION   CHIEF COMPLAINT:  COPD exacerbation, evaluate for treatment.  HISTORY OF PRESENT ILLNESS:  A 55 year old African American female with non-small-cell carcinoma came to the emergency room on March 09, 2011, with sudden onset of dyspnea and cough, wheezing, desatting down to 88% range.  She is on Advair at home along with Atrovent by nebulization q.i.d. at home and Proventil p.r.n.  She was admitted, initially treated with steroids, Avelox.  She has had some improvement in her status, but there was a CT angiogram showing no pulmonary emboli on admission.  She has history of alcohol abuse as well as on the CIWA protocol.  She has ongoing treatment and her lung mass is improving and it is noted to be in the right upper lobe.  This is in fact a non-small-cell carcinoma.  Pulmonary is asked to see the patient because she is slow to improve.  She is still coughing, still having congestion.  She is only on a Combivent MDI 2 puffs q.i.d. and oxygen treatment has been somewhat aggressive.  PAST MEDICAL HISTORY:  Alcohol use, hypertension, tobacco use, COPD, non- small-cell carcinoma of the lung.  ALLERGIES:  None.  FAMILY HISTORY:  Heart disease.  SOCIAL HISTORY:  Marital status single  CURRENT MEDICATIONS: 1. Combivent 2 puffs q.i.d. 2. Nicotine patch. 3. Hydrochlorothiazide 25 mg daily. 4. Advair 250/50 one spray b.i.d. 5. Vitamin B1 daily. 6. Folic acid. 7. Multivitamins. 8. Prednisone 40 mg daily. 9. Lisinopril 10 mg daily. 10.Avelox 400 mg daily. 11.Lopressor 12.5 mg b.i.d.  PHYSICAL EXAMINATION:  VITAL SIGNS:  Temperature 98, blood pressure 153/83, pulse  102, respirations 20, saturation 96% on oxygen. CHEST:  Distant breath sounds, prolonged expiratory phase.  No wheeze or rhonchi noted. CARDIAC:  There was expired wheezes noted and scattered rhonchi noted. CARDIAC:  Regular rate and rhythm without S3, normal S1 and S2. ABDOMEN:  Soft, nontender.  Bowel sounds are active. EXTREMITIES:  No edema, clubbing or venous disease. SKIN:  Clear. NEUROLOGIC:  Intact.  LABORATORY DATA:  White count was 10,600, hemoglobin 10.5.  Sodium 142, potassium 3.5, chloride 98, CO2 40, BUN 20, creatinine 0.47.  Blood sugar 64, blood gas pH 7.38, pCO2 63, pO2 44.  Chest x-ray showed COPD changes.  CT scan of chest showed no pulmonary emboli but right upper lobe mass decreasing in size.  IMPRESSION:  Chronic obstructive pulmonary disease exacerbation with active mucous plugging, ongoing respiratory failure.  This patient now is on lisinopril which may actually be aggravating the patient's upper airway.  She was off metoprolol, now metoprolol still showing on the list to 12.5 mg b.i.d.  I am not sure if this dose is significant enough to cause airway obstruction.  I am concerned that she is not on a sufficient bronchodilator program, only getting 2 puffs every 4 hours of Combivent.  RECOMMENDATIONS:  Increase  prednisone 30 mg b.i.d..  Continue Advair, Mucinex b.i.d. give flutter valve, change to bronchodilator nebs q.6 and p.r.n.  Titrate oxygen for sats of 88-92% and we will follow.     Charlcie Cradle Delford Field, MD, FCCP     PEW/MEDQ  D:  03/14/2011  T:  03/15/2011  Job:  [id number]  Electronically Signed by Shan Levans MD FCCP on 03/15/2011 11:34:08 AM

## 2011-03-18 ENCOUNTER — Encounter: Payer: Self-pay | Admitting: Pulmonary Disease

## 2011-03-21 ENCOUNTER — Ambulatory Visit: Payer: Medicaid Other | Admitting: Internal Medicine

## 2011-03-23 ENCOUNTER — Inpatient Hospital Stay: Payer: Medicaid Other | Admitting: Pulmonary Disease

## 2011-04-08 ENCOUNTER — Ambulatory Visit: Payer: Medicaid Other | Admitting: Radiation Oncology

## 2011-05-04 ENCOUNTER — Other Ambulatory Visit: Payer: Self-pay | Admitting: *Deleted

## 2011-05-04 NOTE — Telephone Encounter (Signed)
Please note the metoprolol directions... Is it 1 tab twice daily or 1/2 tab twice daily?

## 2011-05-05 ENCOUNTER — Other Ambulatory Visit: Payer: Self-pay | Admitting: *Deleted

## 2011-05-05 MED ORDER — IPRATROPIUM-ALBUTEROL 0.5-2.5 (3) MG/3ML IN SOLN: 3.0000 mL | RESPIRATORY_TRACT | Status: DC | PRN

## 2011-05-05 MED ORDER — NICOTINE 7 MG/24HR TD PT24: 1.0000 | MEDICATED_PATCH | TRANSDERMAL | Status: DC

## 2011-05-05 MED ORDER — METOPROLOL TARTRATE 25 MG PO TABS: 12.5000 mg | ORAL_TABLET | Freq: Two times a day (BID) | ORAL | Status: DC

## 2011-05-05 MED ORDER — LOSARTAN POTASSIUM 100 MG PO TABS: 100.0000 mg | ORAL_TABLET | Freq: Every day | ORAL | Status: DC

## 2011-05-05 MED ORDER — FLUTICASONE-SALMETEROL 250-50 MCG/DOSE IN AEPB: 1.0000 | INHALATION_SPRAY | Freq: Two times a day (BID) | RESPIRATORY_TRACT | Status: DC

## 2011-05-05 MED ORDER — IPRATROPIUM BROMIDE 0.02 % IN SOLN: 500.0000 ug | Freq: Four times a day (QID) | RESPIRATORY_TRACT | Status: DC

## 2011-05-05 MED ORDER — ALBUTEROL SULFATE HFA 108 (90 BASE) MCG/ACT IN AERS: 2.0000 | INHALATION_SPRAY | Freq: Four times a day (QID) | RESPIRATORY_TRACT | Status: DC | PRN

## 2011-05-07 ENCOUNTER — Emergency Department (HOSPITAL_COMMUNITY)
Admission: EM | Admit: 2011-05-07 | Discharge: 2011-05-08 | Disposition: A | Discharge status: Home / Self Care | Payer: Medicaid Other | Attending: Emergency Medicine | Admitting: Emergency Medicine

## 2011-05-07 ENCOUNTER — Emergency Department (HOSPITAL_COMMUNITY): Payer: Medicaid Other

## 2011-05-07 DIAGNOSIS — I10 Essential (primary) hypertension: Secondary | ICD-10-CM | POA: Insufficient documentation

## 2011-05-07 DIAGNOSIS — M79609 Pain in unspecified limb: Secondary | ICD-10-CM | POA: Insufficient documentation

## 2011-05-07 DIAGNOSIS — S42309A Unspecified fracture of shaft of humerus, unspecified arm, initial encounter for closed fracture: Secondary | ICD-10-CM | POA: Insufficient documentation

## 2011-05-07 DIAGNOSIS — Y92009 Unspecified place in unspecified non-institutional (private) residence as the place of occurrence of the external cause: Secondary | ICD-10-CM | POA: Insufficient documentation

## 2011-05-07 DIAGNOSIS — W19XXXA Unspecified fall, initial encounter: Secondary | ICD-10-CM | POA: Insufficient documentation

## 2011-05-09 ENCOUNTER — Telehealth: Payer: Self-pay | Admitting: *Deleted

## 2011-05-09 MED ORDER — ALBUTEROL SULFATE (2.5 MG/3ML) 0.083% IN NEBU: 2.5000 mg | INHALATION_SOLUTION | Freq: Four times a day (QID) | RESPIRATORY_TRACT | Status: DC | PRN

## 2011-05-09 NOTE — Telephone Encounter (Signed)
Talked with [phone] - denied ipratropium/albutero nebulizer- prefer for pt to try Atrovent/Albuterol, Combivent inhaler, Spiriva inhaler or Atrovent inhaler . Flag sent to Dr Tonny Branch. Stanton Kidney Patrcia Schnepp RN  05/09/11 9AM

## 2011-05-09 NOTE — Telephone Encounter (Signed)
She already has a prescription for the atrovent nebulizer solution so I will give her a prescription for the albuterol nebulizer solution and she can mix them and use that instead of the Duonebs.

## 2011-05-09 NOTE — Telephone Encounter (Signed)
Addended by: Leodis Sias on: 05/09/2011 09:50 AM   Modules accepted: Orders

## 2011-05-20 ENCOUNTER — Encounter: Payer: Self-pay | Admitting: Internal Medicine

## 2011-05-20 ENCOUNTER — Ambulatory Visit (INDEPENDENT_AMBULATORY_CARE_PROVIDER_SITE_OTHER): Payer: Medicaid Other | Admitting: Internal Medicine

## 2011-05-20 DIAGNOSIS — I10 Essential (primary) hypertension: Secondary | ICD-10-CM

## 2011-05-20 DIAGNOSIS — S42309A Unspecified fracture of shaft of humerus, unspecified arm, initial encounter for closed fracture: Secondary | ICD-10-CM

## 2011-05-20 DIAGNOSIS — J449 Chronic obstructive pulmonary disease, unspecified: Secondary | ICD-10-CM

## 2011-05-20 DIAGNOSIS — C3490 Malignant neoplasm of unspecified part of unspecified bronchus or lung: Secondary | ICD-10-CM

## 2011-05-20 MED ORDER — HYDROCHLOROTHIAZIDE 25 MG PO TABS: 25.0000 mg | ORAL_TABLET | Freq: Every day | ORAL | Status: DC

## 2011-05-20 MED ORDER — METOPROLOL TARTRATE 25 MG PO TABS: 12.5000 mg | ORAL_TABLET | Freq: Two times a day (BID) | ORAL | Status: DC

## 2011-05-20 MED ORDER — LOSARTAN POTASSIUM 100 MG PO TABS: 100.0000 mg | ORAL_TABLET | Freq: Every day | ORAL | Status: DC

## 2011-05-20 MED ORDER — HYDROCODONE-ACETAMINOPHEN 5-500 MG PO TABS: 1.0000 | ORAL_TABLET | Freq: Every day | ORAL | Status: DC

## 2011-05-20 NOTE — Patient Instructions (Addendum)
Please keep your appointment on Wednesday at 11:00 am with Dr. Victorino Dike to follow up on your arm.    Continue with all your other medications as prescribed.  Follow up with Dr. Mitzi Hansen at the [facility] [phone]  Follow up with me in 6 months.

## 2011-05-20 NOTE — Progress Notes (Signed)
Subjective:   Patient ID: Karina Weber female   DOB: 02-05-1956 55 y.o.   MRN: [id number]  HPI: [name] is a 55 y.o. woman who presented to the clinic today for follow up of her chronic medical conditions and refills of her medications.  She has been getting her medications ever since she got approved for Medicaid and now has reliable transportation that can pick her up and bring her to appointments if she gives them enough notice.    She has been doing well from a breathing standpoint with her medications.  She states that she has been having to use the albuterol 1-2 times per day along with her atrovent nebulizer and Advair.  She has cut back on her smoking to a couple of cigarettes per day from 2 ppd.  She is working on quitting all together using patches.  She was hospitalized in June for a COPD exacerbation and has been doing well since then.    In terms of her adenocarcinoma of the lung she has not had her follow up with Dr. Mitzi Weber from the cancer center.  She hasn't seen him since September when she missed her appointment because of lack of transportation.  She now has transportation so she wants to reconnect with him to see if she still qualifies for treatment of her cancer.    She was seen in the ER on 05/07/11 after crawling through a window, falling, and breaking her left humerus.  She has the splint in place and missed her appointment with the Orthopedic doctor to have the splint removed and the replacement cast put on.  She has had some pain in the upper shoulder and she can't move her arm very well but denies any redness, swelling, or skin breakdown.   Past Medical History  Diagnosis Date  . COPD (chronic obstructive pulmonary disease)   . Hypertension   . Alcoholism   . Tobacco abuse   . Lung cancer     Non small cell  . Depression    Current Outpatient Prescriptions  Medication Sig Dispense Refill  . albuterol (PROVENTIL HFA;VENTOLIN HFA) 108 (90 BASE) MCG/ACT  inhaler Inhale 2 puffs into the lungs every 6 (six) hours as needed.  1 Inhaler  3  . albuterol (PROVENTIL) (2.5 MG/3ML) 0.083% nebulizer solution Take 2.5 mg by nebulization every 6 (six) hours as needed for wheezing.  75 mL  12  . aspirin 81 MG tablet Take 1 tablet (81 mg total) by mouth daily.  31 tablet  1  . ferrous gluconate (FERGON) 325 MG tablet Take 325 mg by mouth 3 (three) times daily with meals.        . Fluticasone-Salmeterol (ADVAIR) 250-50 MCG/DOSE AEPB Inhale 1 puff into the lungs every 12 (twelve) hours.  60 each  11  . folic acid (FOLVITE) 1 MG tablet Take 1 tablet (1 mg total) by mouth daily.  31 tablet  1  . hydrochlorothiazide 25 MG tablet Take 1 tablet (25 mg total) by mouth daily.  31 tablet  1  . ipratropium (ATROVENT) 0.02 % nebulizer solution Take 2.5 mLs (500 mcg total) by nebulization 4 (four) times daily.  75 mL  11  . losartan (COZAAR) 100 MG tablet Take 1 tablet (100 mg total) by mouth daily.  31 tablet  11  . metoprolol tartrate (LOPRESSOR) 25 MG tablet Take 0.5 tablets (12.5 mg total) by mouth 2 (two) times daily.  90 tablet  1  . nicotine (NICODERM CQ -  DOSED IN MG/24 HR) 7 mg/24hr patch Place 1 patch onto the skin daily.  28 patch  2  . predniSONE (DELTASONE) 10 MG tablet Take 10 mg by mouth daily. 1 week Taper       . thiamine 100 MG tablet Take 1 tablet (100 mg total) by mouth daily.  31 tablet  1   Family History  Problem Relation Age of Onset  . Heart disease Neg Hx    History   Social History  . Marital Status: Single    Spouse Name: N/A    Number of Children: N/A  . Years of Education: N/A   Social History Main Topics  . Smoking status: Current Everyday Smoker -- 0.5 packs/day    Types: Cigarettes  . Smokeless tobacco: None   Comment: Cut back from 2 ppd when diagnosed with Lung cancer.  . Alcohol Use: No     Former alcoholic  . Drug Use: No  . Sexually Active:    Other Topics Concern  . None   Social History Narrative  . None    Review of Systems: Constitutional: Denies fever, chills, diaphoresis, appetite change and fatigue.  HEENT: Denies photophobia, eye pain, redness, hearing loss, ear pain, congestion, sore throat, rhinorrhea, sneezing, mouth sores, trouble swallowing, neck pain, neck stiffness and tinnitus.   Respiratory: Denies SOB, DOE, cough, chest tightness,  and wheezing.   Cardiovascular: Denies chest pain, palpitations and leg swelling.  Gastrointestinal: Denies nausea, vomiting, abdominal pain, diarrhea, constipation, blood in stool and abdominal distention.  Genitourinary: Denies dysuria, urgency, frequency, hematuria, flank pain and difficulty urinating.  Musculoskeletal: Denies myalgias, back pain, joint swelling, arthralgias and gait problem.  Skin: Denies pallor, rash and wound.  Neurological: Denies dizziness, seizures, syncope, weakness, light-headedness, numbness and headaches.  Hematological: Denies adenopathy. Easy bruising, personal or family bleeding history  Psychiatric/Behavioral: Denies suicidal ideation, mood changes, confusion, nervousness, sleep disturbance and agitation  Objective:  Physical Exam: Filed Vitals:   05/20/11 1404  BP: 124/85  Pulse: 98  Temp: 97.5 F (36.4 C)  TempSrc: Oral  Height: 5' (1.524 m)  Weight: 95 lb (43.092 kg)   Constitutional: Vital signs reviewed.  Patient is a thin, older then stated age appearing woman in no acute distress and cooperative with exam. Alert and oriented x3.  Head: Normocephalic and atraumatic Ear: TM normal bilaterally Mouth: no erythema or exudates, MMM Eyes: PERRL, EOMI, conjunctivae normal, No scleral icterus.  Neck: Supple, Trachea midline normal ROM, No JVD, mass, thyromegaly, or carotid bruit present.  Cardiovascular: RRR, S1 normal, S2 normal, no MRG, pulses symmetric and intact bilaterally Pulmonary/Chest: distant breath sounds with occasional crackles otherwise CTAB Abdominal: Soft. Non-tender, non-distended, bowel  sounds are normal, no masses, organomegaly, or guarding present.  GU: no CVA tenderness Musculoskeletal: There is a full arm splint in place on the left arm from the shoulder to the wrist.  It is wrapped poorly and has several spots that could use some additional padding.  No joint deformities, erythema, or stiffness, ROM full and no nontender Hematology: no cervical, inginal, or axillary adenopathy.  Neurological: A&O x3, Strength is normal and symmetric bilaterally, cranial nerve II-XII are grossly intact, no focal motor deficit, sensory intact to light touch bilaterally.  Skin: Warm, dry and intact. No rash, cyanosis, or clubbing.  Psychiatric: Normal mood and affect. speech and behavior is normal. Judgment and thought content normal. Cognition and memory are normal.   Assessment & Plan:

## 2011-05-23 NOTE — Assessment & Plan Note (Signed)
X-ray showed a fracture.  She missed her follow up with the Orthopedic doctor.  We called from the clinic to make an appointment for her and she arranged with her transportation company while in the office.  I encouraged her to keep her follow up appointment.

## 2011-05-23 NOTE — Assessment & Plan Note (Signed)
Her breathing is stable at this point.  She has severe COPD by functional status.  We will refill her inhalers today.

## 2011-05-23 NOTE — Assessment & Plan Note (Signed)
She missed her follow up with Dr. Mitzi Hansen and has not made a follow up appointment yet.  I encouraged her, now that she has her reliable transportation to make a follow up appointment.

## 2011-05-23 NOTE — Progress Notes (Signed)
Pt aware of appt with Dr Victorino Dike 05/25/11 11:15AM. Stanton Kidney Ditzker RN 05/23/11 10:40AM

## 2011-05-23 NOTE — Assessment & Plan Note (Signed)
BP Readings from Last 3 Encounters:  05/20/11 124/85  05/21/10 154/90  04/30/10 162/84   Blood pressure today is much improved over previously.  We will continue to monitor.

## 2011-05-25 ENCOUNTER — Other Ambulatory Visit: Payer: Self-pay | Admitting: *Deleted

## 2011-05-25 DIAGNOSIS — S42309A Unspecified fracture of shaft of humerus, unspecified arm, initial encounter for closed fracture: Secondary | ICD-10-CM

## 2011-05-25 NOTE — Telephone Encounter (Signed)
Please

## 2011-05-27 MED ORDER — NICOTINE 7 MG/24HR TD PT24: 1.0000 | MEDICATED_PATCH | TRANSDERMAL | Status: DC

## 2011-05-27 NOTE — Telephone Encounter (Signed)
I sent through the prescription for the nicotine patches for her.  I gave her a supply of Vicodin that should have lasted her well until her appointment with Ortho on 8/22.  I was assuming that they would continue to prescribe the narcotics until she was done with her treatment.  Please call Karina Weber and ask if she went to her appointment and what is going on.  If they refused to fill her script but she had a cast placed or something I'll fill it for a few more weeks.

## 2011-05-30 NOTE — Telephone Encounter (Signed)
Called to pharm, pt informed 

## 2011-06-03 ENCOUNTER — Ambulatory Visit
Admission: RE | Admit: 2011-06-03 | Discharge: 2011-06-03 | Disposition: A | Discharge status: Home / Self Care | Payer: Medicaid Other | Source: Ambulatory Visit | Attending: Radiation Oncology | Admitting: Radiation Oncology

## 2011-06-03 ENCOUNTER — Other Ambulatory Visit: Payer: Self-pay | Admitting: Radiation Oncology

## 2011-06-03 DIAGNOSIS — C3490 Malignant neoplasm of unspecified part of unspecified bronchus or lung: Secondary | ICD-10-CM

## 2011-06-07 ENCOUNTER — Other Ambulatory Visit: Payer: Self-pay | Admitting: *Deleted

## 2011-06-07 DIAGNOSIS — S42309A Unspecified fracture of shaft of humerus, unspecified arm, initial encounter for closed fracture: Secondary | ICD-10-CM

## 2011-06-07 NOTE — Telephone Encounter (Signed)
States she still having arm pain.

## 2011-06-07 NOTE — Telephone Encounter (Signed)
Could you please call Karina Weber and see if she made it to her ortho appointment and if they casted her?  I don't want to just give her consistent narcotics with her history of alcoholism and drugs etc.

## 2011-06-07 NOTE — Telephone Encounter (Signed)
Med already written

## 2011-06-08 MED ORDER — HYDROCODONE-ACETAMINOPHEN 5-500 MG PO TABS: 1.0000 | ORAL_TABLET | Freq: Every day | ORAL | Status: DC

## 2011-06-08 NOTE — Telephone Encounter (Signed)
Called pt and she did go to ortho doctor and had splint ( not cast ) on her arm.  She states she  is still  having shoulder,elbow, arm and bone pain.  Rates pain 7/10, constant and she is not able to sleep because of this. She has a return appointment with ortho in 2 weeks.

## 2011-06-08 NOTE — Telephone Encounter (Signed)
Rx called in 

## 2011-06-10 ENCOUNTER — Other Ambulatory Visit (HOSPITAL_COMMUNITY): Payer: Medicaid Other

## 2011-06-10 ENCOUNTER — Ambulatory Visit (HOSPITAL_COMMUNITY)
Admission: RE | Admit: 2011-06-10 | Discharge: 2011-06-10 | Disposition: A | Discharge status: Home / Self Care | Payer: Medicaid Other | Source: Ambulatory Visit | Attending: Radiation Oncology | Admitting: Radiation Oncology

## 2011-06-10 ENCOUNTER — Encounter (HOSPITAL_COMMUNITY): Payer: Self-pay

## 2011-06-10 DIAGNOSIS — J4489 Other specified chronic obstructive pulmonary disease: Secondary | ICD-10-CM | POA: Insufficient documentation

## 2011-06-10 DIAGNOSIS — J449 Chronic obstructive pulmonary disease, unspecified: Secondary | ICD-10-CM | POA: Insufficient documentation

## 2011-06-10 DIAGNOSIS — C3490 Malignant neoplasm of unspecified part of unspecified bronchus or lung: Secondary | ICD-10-CM | POA: Insufficient documentation

## 2011-06-10 MED ORDER — IOHEXOL 300 MG/ML  SOLN
80.0000 mL | Freq: Once | INTRAMUSCULAR | Status: AC | PRN
  Administered 2011-06-10: 80 mL via INTRAVENOUS

## 2011-07-01 ENCOUNTER — Ambulatory Visit
Admission: RE | Admit: 2011-07-01 | Discharge: 2011-07-01 | Disposition: A | Discharge status: Home / Self Care | Payer: Medicaid Other | Source: Ambulatory Visit | Attending: Radiation Oncology | Admitting: Radiation Oncology

## 2011-07-04 ENCOUNTER — Other Ambulatory Visit: Payer: Self-pay | Admitting: Radiation Oncology

## 2011-07-04 DIAGNOSIS — C3490 Malignant neoplasm of unspecified part of unspecified bronchus or lung: Secondary | ICD-10-CM

## 2011-07-05 LAB — POCT I-STAT, CHEM 8
BUN: 14
Chloride: 104
Creatinine, Ser: 0.5
Potassium: 4
Sodium: 137
TCO2: 25

## 2011-07-05 LAB — DIFFERENTIAL
Eosinophils Relative: 2
Lymphocytes Relative: 30
Lymphs Abs: 3
Neutro Abs: 5.9

## 2011-07-05 LAB — CBC
HCT: 39.9
Platelets: 271
WBC: 10

## 2011-07-05 LAB — ETHANOL: Alcohol, Ethyl (B): 145 — ABNORMAL HIGH

## 2011-07-13 LAB — RAPID URINE DRUG SCREEN, HOSP PERFORMED
Barbiturates: NOT DETECTED
Benzodiazepines: NOT DETECTED

## 2011-07-13 LAB — BASIC METABOLIC PANEL
BUN: 13
BUN: 13
BUN: 4 — ABNORMAL LOW
BUN: 6
CO2: 27
CO2: 30
CO2: 35 — ABNORMAL HIGH
CO2: 37 — ABNORMAL HIGH
CO2: 38 — ABNORMAL HIGH
Calcium: 9.1
Calcium: 9.3
Chloride: 101
Chloride: 102
Chloride: 102
Chloride: 104
Chloride: 90 — ABNORMAL LOW
Chloride: 94 — ABNORMAL LOW
Chloride: 94 — ABNORMAL LOW
Chloride: 94 — ABNORMAL LOW
Chloride: 99
Creatinine, Ser: 0.38 — ABNORMAL LOW
Creatinine, Ser: 0.51
Creatinine, Ser: 0.54
GFR calc Af Amer: >60
GFR calc Af Amer: >60
GFR calc Af Amer: >60
GFR calc Af Amer: >60
GFR calc Af Amer: >60
GFR calc Af Amer: >60
GFR calc non Af Amer: >60
GFR calc non Af Amer: >60
GFR calc non Af Amer: >60
GFR calc non Af Amer: >60
GFR calc non Af Amer: >60
Glucose, Bld: 113 — ABNORMAL HIGH
Glucose, Bld: 212 — ABNORMAL HIGH
Glucose, Bld: 93
Potassium: 3.1 — ABNORMAL LOW
Potassium: 3.2 — ABNORMAL LOW
Potassium: 3.5
Potassium: 3.5
Potassium: 3.8
Potassium: 3.9
Potassium: 4
Potassium: 4.2
Potassium: 4.5
Sodium: 139
Sodium: 140
Sodium: 142
Sodium: 142

## 2011-07-13 LAB — CBC
HCT: 35.1 — ABNORMAL LOW
HCT: 36.2
HCT: 36.2
HCT: 38.3
HCT: 38.5
HCT: 42.3
HCT: 46.7 — ABNORMAL HIGH
Hemoglobin: 12.1
Hemoglobin: 12.7
Hemoglobin: 12.7
Hemoglobin: 14
Hemoglobin: 15.5 — ABNORMAL HIGH
MCHC: 32.3
MCHC: 33
MCHC: 33.2
MCHC: 33.3
MCV: 96.1
MCV: 96.6
MCV: 96.7
MCV: 98.5
MCV: 98.6
Platelets: 260
Platelets: 325
Platelets: 445 — ABNORMAL HIGH
RBC: 3.6 — ABNORMAL LOW
RBC: 3.66 — ABNORMAL LOW
RBC: 3.98
RBC: 3.99
RBC: 4.68
RBC: 4.86
RDW: 15.7 — ABNORMAL HIGH
RDW: 16.8 — ABNORMAL HIGH
WBC: 10.4
WBC: 11.4 — ABNORMAL HIGH
WBC: 11.9 — ABNORMAL HIGH
WBC: 12.7 — ABNORMAL HIGH
WBC: 14.2 — ABNORMAL HIGH
WBC: 8.9

## 2011-07-13 LAB — COMPREHENSIVE METABOLIC PANEL
ALT: 10
Alkaline Phosphatase: 123 — ABNORMAL HIGH
BUN: 28 — ABNORMAL HIGH
BUN: 6
CO2: 27
CO2: 30
CO2: 32
Calcium: 9.1
Chloride: 103
Chloride: 103
Chloride: 91 — ABNORMAL LOW
Creatinine, Ser: 0.34 — ABNORMAL LOW
Creatinine, Ser: 0.55
GFR calc non Af Amer: >60
GFR calc non Af Amer: >60
GFR calc non Af Amer: >60
Glucose, Bld: 76
Potassium: 3.1 — ABNORMAL LOW
Sodium: 142
Total Bilirubin: 0.4
Total Bilirubin: 0.9

## 2011-07-13 LAB — BLOOD GAS, ARTERIAL
Acid-Base Excess: 1.6
Acid-Base Excess: 11.1 — ABNORMAL HIGH
Acid-Base Excess: 11.9 — ABNORMAL HIGH
Acid-Base Excess: 2.2 — ABNORMAL HIGH
Acid-Base Excess: 2.3 — ABNORMAL HIGH
Acid-Base Excess: 4.5 — ABNORMAL HIGH
Bicarbonate: 27.1 — ABNORMAL HIGH
Bicarbonate: 27.3 — ABNORMAL HIGH
Bicarbonate: 29.3 — ABNORMAL HIGH
Bicarbonate: 30.7 — ABNORMAL HIGH
Bicarbonate: 36.3 — ABNORMAL HIGH
Bicarbonate: 37.1 — ABNORMAL HIGH
Bicarbonate: 37.2 — ABNORMAL HIGH
Delivery systems: POSITIVE
Drawn by: 138421
Drawn by: 257081
Drawn by: 277361
Drawn by: 283371
FIO2: 0.4
FIO2: 0.4
FIO2: 0.5
MECHVT: 450
O2 Saturation: 87.1
O2 Saturation: 90.5
O2 Saturation: 91.5
O2 Saturation: 92.1
O2 Saturation: 95.4
O2 Saturation: 96.9
PEEP: 10
PEEP: 5
PEEP: 5
Patient temperature: 98
Patient temperature: 98.6
Patient temperature: 98.6
Patient temperature: 98.6
Patient temperature: 98.6
Patient temperature: 98.6
Pressure support: 5
RATE: 12
RATE: 12
TCO2: 29
TCO2: 31.4
TCO2: 32.8
TCO2: 36.9
TCO2: 37.8
TCO2: 38.7
pCO2 arterial: 46.4 — ABNORMAL HIGH
pCO2 arterial: 46.8 — ABNORMAL HIGH
pCO2 arterial: 48.7 — ABNORMAL HIGH
pCO2 arterial: 51.2 — ABNORMAL HIGH
pCO2 arterial: 69.4
pH, Arterial: 7.243 — ABNORMAL LOW
pH, Arterial: 7.249 — ABNORMAL LOW
pH, Arterial: 7.369
pH, Arterial: 7.388
pH, Arterial: 7.51 — ABNORMAL HIGH
pH, Arterial: 7.512 — ABNORMAL HIGH
pO2, Arterial: 53.4 — ABNORMAL LOW
pO2, Arterial: 64.9 — ABNORMAL LOW
pO2, Arterial: 66.8 — ABNORMAL LOW
pO2, Arterial: 72.9 — ABNORMAL LOW
pO2, Arterial: 79.5 — ABNORMAL LOW
pO2, Arterial: 83.4
pO2, Arterial: 91.4

## 2011-07-13 LAB — T4, FREE: Free T4: 0.8 — ABNORMAL LOW

## 2011-07-13 LAB — CULTURE, BLOOD (ROUTINE X 2)
Culture: NO GROWTH
Culture: NO GROWTH

## 2011-07-13 LAB — POCT I-STAT 3, ART BLOOD GAS (G3+)
O2 Saturation: 94
Operator id: 290121
pCO2 arterial: 46.8 — ABNORMAL HIGH
pH, Arterial: 7.41 — ABNORMAL HIGH

## 2011-07-13 LAB — DIFFERENTIAL
Basophils Absolute: 0
Lymphocytes Relative: 11 — ABNORMAL LOW
Neutro Abs: 9.8 — ABNORMAL HIGH

## 2011-07-13 LAB — URINE CULTURE: Colony Count: >=100000

## 2011-07-13 LAB — URINALYSIS, ROUTINE W REFLEX MICROSCOPIC
Ketones, ur: 15 — AB
Nitrite: NEGATIVE
pH: 6

## 2011-07-13 LAB — LEGIONELLA ANTIGEN, URINE: Legionella Antigen, Urine: NEGATIVE

## 2011-07-13 LAB — APTT: aPTT: 31

## 2011-07-13 LAB — CK TOTAL AND CKMB (NOT AT ARMC): Relative Index: INVALID

## 2011-07-13 LAB — CARDIAC PANEL(CRET KIN+CKTOT+MB+TROPI): CK, MB: 2.2

## 2011-07-13 LAB — MAGNESIUM
Magnesium: 1.1 — ABNORMAL LOW
Magnesium: 2.1

## 2011-07-13 LAB — TSH: TSH: 0.244 — ABNORMAL LOW

## 2011-07-13 LAB — POCT CARDIAC MARKERS
CKMB, poc: 1.8
Troponin i, poc: <0.05

## 2011-07-13 LAB — URINE MICROSCOPIC-ADD ON

## 2011-07-13 LAB — AMMONIA: Ammonia: 50 — ABNORMAL HIGH

## 2011-07-13 LAB — PHOSPHORUS: Phosphorus: 2.5

## 2011-07-13 LAB — ETHANOL: Alcohol, Ethyl (B): <5

## 2011-07-28 ENCOUNTER — Telehealth: Payer: Self-pay | Admitting: *Deleted

## 2011-07-28 NOTE — Telephone Encounter (Signed)
She was formerly on Lisinopril until her hospitalization in June 2012 because of a cough.  She was switched to Losartan by Pulmonary trying to eliminate all reasons for her to have a cough.  I will add Lisinopril to her intolerance lists.  If there is any part of the prior authorization for this please let me know tomorrow when I'm in clinic.

## 2011-07-28 NOTE — Telephone Encounter (Signed)
Prior Authorization request received from pharmacy regarding pt's rx for losartan 100mg  daily. Patient can only obtain losartan after she tries and fail an ACE inhibitor.  Preferred ACE includes benazepril, captopril, enalapril ,fosinopril, lisinopril, moexipril perindopril, quinapril, ramipril, trandolapril.  Pt also takes hctz which can be combined with most of the meds previously mentioned.  Please indicate if pt has a documented intolerance to ACE or has tried and failed one in the past.  If not please change to a preferred medication, thank you.Criss Alvine, Neftali Abair Cassady10/25/201211:44 AM

## 2011-08-05 ENCOUNTER — Other Ambulatory Visit: Payer: Self-pay | Admitting: *Deleted

## 2011-08-06 MED ORDER — ALBUTEROL SULFATE HFA 108 (90 BASE) MCG/ACT IN AERS: 2.0000 | INHALATION_SPRAY | Freq: Four times a day (QID) | RESPIRATORY_TRACT | Status: DC | PRN

## 2011-08-09 ENCOUNTER — Other Ambulatory Visit: Payer: Self-pay | Admitting: *Deleted

## 2011-08-09 DIAGNOSIS — I10 Essential (primary) hypertension: Secondary | ICD-10-CM

## 2011-08-09 MED ORDER — HYDROCHLOROTHIAZIDE 25 MG PO TABS: 25.0000 mg | ORAL_TABLET | Freq: Every day | ORAL | Status: DC

## 2011-09-14 NOTE — Telephone Encounter (Signed)
Pt tried and failed lisinopril during hospitalization.  PA approved for 1 year ending on 09/13/2012. Pt and fpharmacy aware.Kingsley Spittle Cassady12/12/20129:36 AM

## 2011-10-14 ENCOUNTER — Other Ambulatory Visit: Payer: Self-pay | Admitting: *Deleted

## 2011-10-14 DIAGNOSIS — I10 Essential (primary) hypertension: Secondary | ICD-10-CM

## 2011-10-14 MED ORDER — HYDROCHLOROTHIAZIDE 25 MG PO TABS: 25.0000 mg | ORAL_TABLET | Freq: Every day | ORAL | Status: DC

## 2011-10-21 ENCOUNTER — Encounter: Payer: Self-pay | Admitting: *Deleted

## 2011-10-21 DIAGNOSIS — Z923 Personal history of irradiation: Secondary | ICD-10-CM | POA: Insufficient documentation

## 2011-10-24 ENCOUNTER — Ambulatory Visit: Payer: Medicaid Other | Attending: Radiation Oncology

## 2011-10-26 ENCOUNTER — Other Ambulatory Visit: Payer: Self-pay | Admitting: Radiation Oncology

## 2011-10-26 DIAGNOSIS — C3490 Malignant neoplasm of unspecified part of unspecified bronchus or lung: Secondary | ICD-10-CM

## 2011-10-27 ENCOUNTER — Ambulatory Visit: Admission: RE | Admit: 2011-10-27 | Payer: Medicaid Other | Source: Ambulatory Visit

## 2011-10-27 ENCOUNTER — Other Ambulatory Visit (HOSPITAL_COMMUNITY): Payer: Medicaid Other

## 2011-10-28 ENCOUNTER — Ambulatory Visit: Payer: Medicaid Other | Admitting: Radiation Oncology

## 2011-11-06 IMAGING — PT NM PET TUM IMG INITIAL (PI) SKULL BASE T - THIGH
6 series · 25 of 25 positions shown · non-contrast
Comparison: CT chest 04/24/2010

CLINICAL DATA: Initial treatment strategy for lung mass.

NUCLEAR MEDICINE PET SKULL BASE TO THIGH
Fasting Blood Glucose:  95
TECHNIQUE: 18.1 mCi F-18 FDG was injected intravenously via the
left antecubital fossa.  Full-ring PET imaging was performed from
the skull base through the mid-thighs 65  minutes after injection.
CT data was obtained and used for attenuation correction and
anatomic localization only.  (This was not acquired as a diagnostic
CT examination.)

[Series 1: pet ac · axial · 3.3mm · 4.69mm/px · z∈[-725,+1]mm · 5 of 223 slices shown]
[im 1/223]
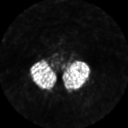
[im 56/223]
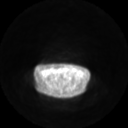
[im 112/223]
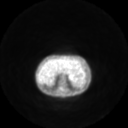
[im 167/223]
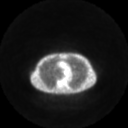
[im 223/223]
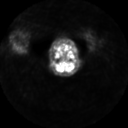

[Series 2: pet nac · axial · 3.3mm · 4.69mm/px · z∈[-725,+1]mm · 6 of 223 slices shown]
[im 1/223]
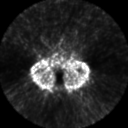
[im 45/223]
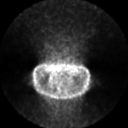
[im 89/223]
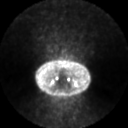
[im 134/223]
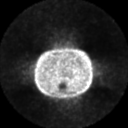
[im 178/223]
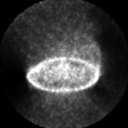
[im 223/223]
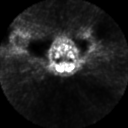

[Series 2: ct images · axial · 3.8mm · 0.98mm/px · z∈[-725,+0]mm · 5 of 223 slices shown]
[im 1/223]
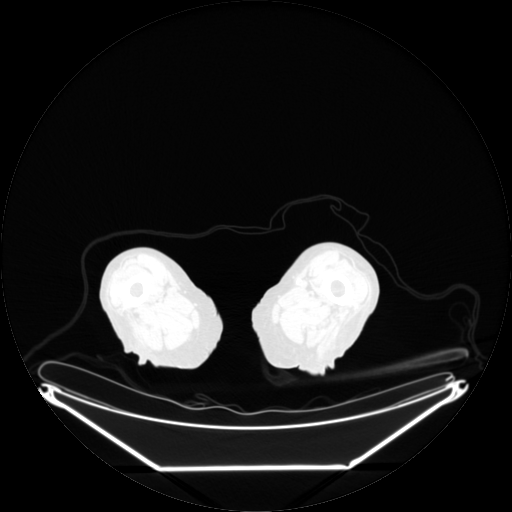
[im 56/223]
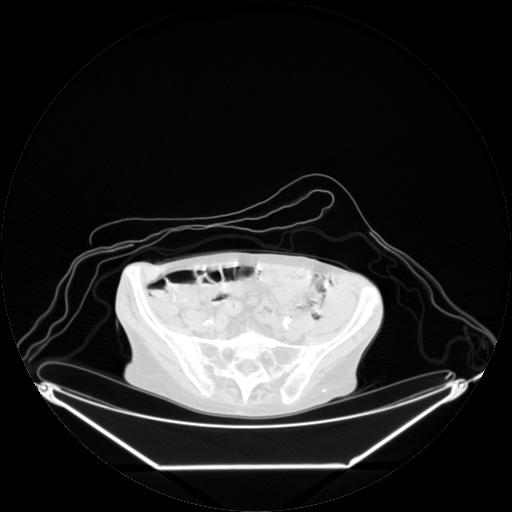
[im 112/223]
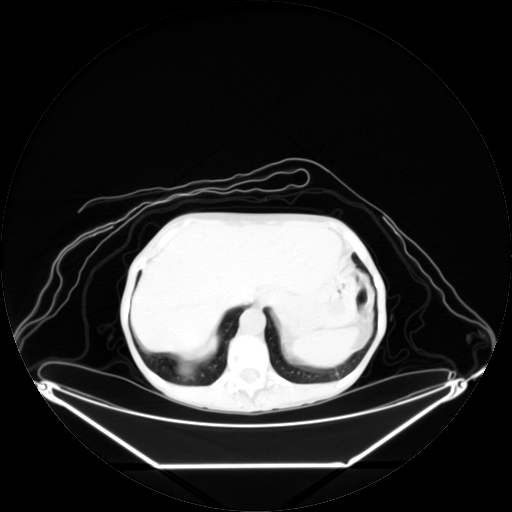
[im 167/223]
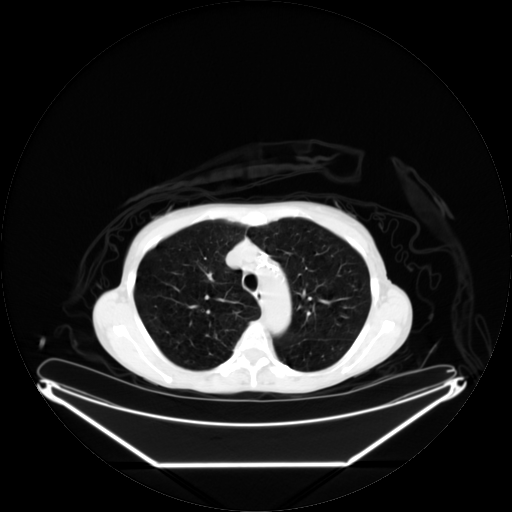
[im 223/223]
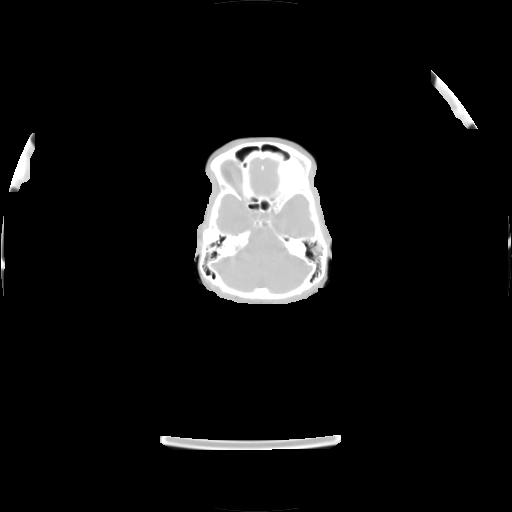

[Series 123: mip · coronal · 3.3mm · 4.69mm/px · 1 of 30 slices shown]
[im 1/30]
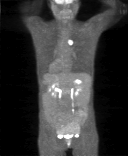

[Series 151: reformatted · axial · 3.3mm · 3.91mm/px · z∈[-725,+1]mm · 6 of 221 slices shown (1 of 2)]
[im 1/221]
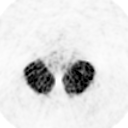
[im 45/221]
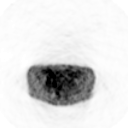
[im 89/221]
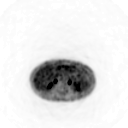
[im 133/221]
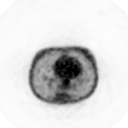
[im 177/221]
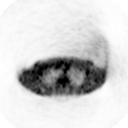
[im 221/221]
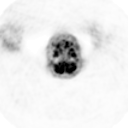

[Series 153: reformatted · coronal · 4.7mm · 5.83mm/px · 2 of 60 slices shown (2 of 2)]
[im 1/60]
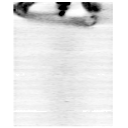
[im 60/60]
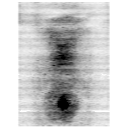

[25 of 25 positions shown; findings below may reference images not displayed]

FINDINGS: Neck:No hypermetabolic nodes in the neck.

Chest:Within the posterior superior aspect of the right lower lobe,
there is a 2.6 x 1.8 cm nodule with intense metabolic activity (
SUV max = 8.2).  Additional small pulmonary nodules which are less
than 5 mm within the right middle lobe (image 65 for example and
69).  These small nodules below PET sensitivity.  There is a small
nodule within the right lower lobe on image 81.  No hypermetabolic
mediastinal lymph nodes.

Abdomen / Pelvis:No abnormal hypermetabolic activity within the
solid organs.  No evidence of abdominal or pelvic hypermetabolic
nodes.

Skeleton:No focal hypermetabolic activity to suggest skeletal
metastasis.
IMPRESSION: 1.  Hypermetabolic right lower lobe pulmonary nodule consistent
with malignancy.
2.  Small pulmonary nodules in the right upper lobe and right lower
lobe are below PET sensitivity.  Recommend attention on follow-up.
3.  No evidence of mediastinal metastasis.  No evidence of distant
metastasis.

## 2011-11-10 ENCOUNTER — Other Ambulatory Visit: Payer: Self-pay | Admitting: *Deleted

## 2011-11-10 DIAGNOSIS — I10 Essential (primary) hypertension: Secondary | ICD-10-CM

## 2011-11-11 MED ORDER — HYDROCHLOROTHIAZIDE 25 MG PO TABS: 25.0000 mg | ORAL_TABLET | Freq: Every day | ORAL | Status: DC

## 2011-11-11 NOTE — Telephone Encounter (Signed)
Message sent to front desk for an appt. 

## 2011-11-11 NOTE — Telephone Encounter (Signed)
Refill approved.  Karina Weber hasn't been seen in a while and she has no follow up scheduled.  If we could schedule something just to see how she is doing that'd be great.

## 2011-11-30 ENCOUNTER — Encounter: Payer: Self-pay | Admitting: *Deleted

## 2011-12-02 ENCOUNTER — Ambulatory Visit
Admission: RE | Admit: 2011-12-02 | Discharge: 2011-12-02 | Disposition: A | Discharge status: Home / Self Care | Payer: Medicaid Other | Source: Ambulatory Visit | Attending: Radiation Oncology | Admitting: Radiation Oncology

## 2011-12-02 ENCOUNTER — Encounter: Payer: Self-pay | Admitting: Radiation Oncology

## 2011-12-02 ENCOUNTER — Encounter: Payer: Medicaid Other | Admitting: Internal Medicine

## 2011-12-02 VITALS — BP 175/98 | HR 93 | Temp 98.6°F | Resp 20 | Wt 92.7 lb

## 2011-12-02 DIAGNOSIS — C3410 Malignant neoplasm of upper lobe, unspecified bronchus or lung: Secondary | ICD-10-CM

## 2011-12-02 NOTE — Progress Notes (Signed)
Pt reports fatigue, weakness. She uses O2 @2L /min nightly, prn during the day. Dry cough, SOB w/minimal exertion. Nebs tid. She c/o "sharp" pain in her lower back, both sides, R shoulder. She states when she lies down at night, her "whole body aches". Ibuprofen w/little relief. Uses heating pad. Pt also states she occass has pain midchest, rubs in Vicks salve which helps her sleep.  Pt did not have CT chest in Jan, states she "was sick".

## 2011-12-02 NOTE — Progress Notes (Signed)
Pt's BP checked twice, states she has taken her BP meds this morning. Will recheck prior to pt leaving today.

## 2011-12-05 ENCOUNTER — Other Ambulatory Visit: Payer: Self-pay | Admitting: Internal Medicine

## 2011-12-05 NOTE — Telephone Encounter (Signed)
Please schedule Karina Weber for a follow up since she missed her's on 12/02/11 with me.

## 2011-12-06 ENCOUNTER — Telehealth: Payer: Self-pay | Admitting: *Deleted

## 2011-12-06 NOTE — Progress Notes (Signed)
CC:   Karina Plane, MD Karina Sias, MD  DIAGNOSIS:  Early stage non-small cell lung cancer.  INTERVAL HISTORY:  Karina Weber returns to clinic today for followup.  She was last seen in September of 2012.  The plan was to have her undergo a repeat CT scan of the chest in January of this year with subsequent followup.  She states that she did not have the scan done because she did not feel well at the time of this being scheduled.  The patient also did not show for the followup, but has rescheduled to see me today.  She complains of some fatigue and generalized weakness as well as a dry cough.  She states as her primary complaint today that "my whole body aches".  She denies congestion or signs of an upper respiratory infection.  PHYSICAL EXAM:  Weight 92 pounds, blood pressure 175/95, pulse 101, temperature 98.6, O2 saturation 94%.  Cardiovascular:  Regular rate and rhythm.  Respiratory:  Clear to auscultation.  GI:  The patient does have a subcutaneous nodule within the abdomen right of midline.  She states that this is moderately tender.  Extremities:  No edema.  IMPRESSION/PLAN:  Karina Weber is returning today with some generalized complaints of some fatigue and achiness.  She did have a focal area of tenderness with what appears to be a subcutaneous nodule in the abdominal region as well.  Previously Karina Weber has been lost to followup and she did not undergo the scan of the chest that we had previously scheduled for her.  Based on her complaints today, I would like to proceed with a CT of the chest, abdomen and pelvis to help clarify whether some of her symptoms and findings are related to her prior history of lung cancer which was quite early stage versus other possible etiologies.  I will therefore arrange for this to be completed as soon as possible, and we will have Karina Weber come back to clinic to review the results.  The patient is to let us know if her  symptoms worsen.  I spent 10 minutes with Karina Weber today, the majority of which was spent counseling her on her diagnosis of lung cancer and coordinating her care.    ______________________________ Radene Gunning, M.D., Ph.D. JSM/MEDQ  D:  12/06/2011  T:  12/06/2011  Job:  [id number]

## 2011-12-06 NOTE — Telephone Encounter (Signed)
XXXX 

## 2011-12-08 ENCOUNTER — Ambulatory Visit
Admission: RE | Admit: 2011-12-08 | Discharge: 2011-12-08 | Disposition: A | Discharge status: Home / Self Care | Payer: Medicaid Other | Source: Ambulatory Visit | Attending: Radiation Oncology | Admitting: Radiation Oncology

## 2011-12-08 ENCOUNTER — Telehealth: Payer: Self-pay | Admitting: *Deleted

## 2011-12-08 DIAGNOSIS — C3410 Malignant neoplasm of upper lobe, unspecified bronchus or lung: Secondary | ICD-10-CM

## 2011-12-08 LAB — CREATININE, SERUM: Creatinine, Ser: 0.56 mg/dL (ref 0.50–1.10)

## 2011-12-08 NOTE — Telephone Encounter (Signed)
xxxx 

## 2011-12-09 ENCOUNTER — Inpatient Hospital Stay (HOSPITAL_COMMUNITY)
Admission: RE | Admit: 2011-12-09 | Discharge: 2011-12-09 | Payer: Medicaid Other | Source: Ambulatory Visit | Attending: Radiation Oncology | Admitting: Radiation Oncology

## 2011-12-15 ENCOUNTER — Ambulatory Visit (HOSPITAL_COMMUNITY): Payer: Medicaid Other

## 2011-12-19 ENCOUNTER — Telehealth: Payer: Self-pay | Admitting: *Deleted

## 2011-12-19 NOTE — Telephone Encounter (Signed)
xxxx 

## 2011-12-21 ENCOUNTER — Ambulatory Visit (HOSPITAL_COMMUNITY)
Admission: RE | Admit: 2011-12-21 | Discharge: 2011-12-21 | Disposition: A | Discharge status: Home / Self Care | Payer: Medicaid Other | Source: Ambulatory Visit | Attending: Radiation Oncology | Admitting: Radiation Oncology

## 2011-12-21 DIAGNOSIS — R1110 Vomiting, unspecified: Secondary | ICD-10-CM | POA: Insufficient documentation

## 2011-12-21 DIAGNOSIS — J438 Other emphysema: Secondary | ICD-10-CM | POA: Insufficient documentation

## 2011-12-21 DIAGNOSIS — R0602 Shortness of breath: Secondary | ICD-10-CM | POA: Insufficient documentation

## 2011-12-21 DIAGNOSIS — R599 Enlarged lymph nodes, unspecified: Secondary | ICD-10-CM | POA: Insufficient documentation

## 2011-12-21 DIAGNOSIS — N281 Cyst of kidney, acquired: Secondary | ICD-10-CM | POA: Insufficient documentation

## 2011-12-21 DIAGNOSIS — J984 Other disorders of lung: Secondary | ICD-10-CM | POA: Insufficient documentation

## 2011-12-21 DIAGNOSIS — C3410 Malignant neoplasm of upper lobe, unspecified bronchus or lung: Secondary | ICD-10-CM

## 2011-12-21 DIAGNOSIS — R109 Unspecified abdominal pain: Secondary | ICD-10-CM | POA: Insufficient documentation

## 2011-12-21 DIAGNOSIS — C3490 Malignant neoplasm of unspecified part of unspecified bronchus or lung: Secondary | ICD-10-CM | POA: Insufficient documentation

## 2011-12-21 DIAGNOSIS — R079 Chest pain, unspecified: Secondary | ICD-10-CM | POA: Insufficient documentation

## 2011-12-21 MED ORDER — IOHEXOL 300 MG/ML  SOLN
100.0000 mL | Freq: Once | INTRAMUSCULAR | Status: AC | PRN
  Administered 2011-12-21: 100 mL via INTRAVENOUS

## 2011-12-26 ENCOUNTER — Other Ambulatory Visit: Payer: Self-pay

## 2011-12-26 ENCOUNTER — Emergency Department (HOSPITAL_COMMUNITY): Payer: Medicaid Other

## 2011-12-26 ENCOUNTER — Inpatient Hospital Stay (HOSPITAL_COMMUNITY)
Admission: EM | Admit: 2011-12-26 | Discharge: 2011-12-30 | DRG: 189 | Disposition: A | Discharge status: Home / Self Care | Payer: Medicaid Other | Attending: Internal Medicine | Admitting: Internal Medicine

## 2011-12-26 ENCOUNTER — Encounter (HOSPITAL_COMMUNITY): Payer: Self-pay | Admitting: Emergency Medicine

## 2011-12-26 DIAGNOSIS — Z79899 Other long term (current) drug therapy: Secondary | ICD-10-CM

## 2011-12-26 DIAGNOSIS — J9620 Acute and chronic respiratory failure, unspecified whether with hypoxia or hypercapnia: Principal | ICD-10-CM | POA: Diagnosis present

## 2011-12-26 DIAGNOSIS — Z923 Personal history of irradiation: Secondary | ICD-10-CM

## 2011-12-26 DIAGNOSIS — Z9981 Dependence on supplemental oxygen: Secondary | ICD-10-CM

## 2011-12-26 DIAGNOSIS — G4700 Insomnia, unspecified: Secondary | ICD-10-CM | POA: Diagnosis present

## 2011-12-26 DIAGNOSIS — F17200 Nicotine dependence, unspecified, uncomplicated: Secondary | ICD-10-CM | POA: Diagnosis present

## 2011-12-26 DIAGNOSIS — R1310 Dysphagia, unspecified: Secondary | ICD-10-CM | POA: Diagnosis present

## 2011-12-26 DIAGNOSIS — F3289 Other specified depressive episodes: Secondary | ICD-10-CM | POA: Diagnosis present

## 2011-12-26 DIAGNOSIS — J441 Chronic obstructive pulmonary disease with (acute) exacerbation: Secondary | ICD-10-CM | POA: Diagnosis present

## 2011-12-26 DIAGNOSIS — I10 Essential (primary) hypertension: Secondary | ICD-10-CM

## 2011-12-26 DIAGNOSIS — Z7982 Long term (current) use of aspirin: Secondary | ICD-10-CM

## 2011-12-26 DIAGNOSIS — J9612 Chronic respiratory failure with hypercapnia: Secondary | ICD-10-CM | POA: Diagnosis present

## 2011-12-26 DIAGNOSIS — F329 Major depressive disorder, single episode, unspecified: Secondary | ICD-10-CM | POA: Diagnosis present

## 2011-12-26 DIAGNOSIS — J9602 Acute respiratory failure with hypercapnia: Secondary | ICD-10-CM

## 2011-12-26 DIAGNOSIS — J449 Chronic obstructive pulmonary disease, unspecified: Secondary | ICD-10-CM | POA: Insufficient documentation

## 2011-12-26 DIAGNOSIS — Z85118 Personal history of other malignant neoplasm of bronchus and lung: Secondary | ICD-10-CM

## 2011-12-26 HISTORY — DX: Alcohol abuse, uncomplicated: F10.10

## 2011-12-26 LAB — CBC
MCH: 31.3 pg (ref 26.0–34.0)
MCHC: 33.1 g/dL (ref 30.0–36.0)
Platelets: 323 10*3/uL (ref 150–400)
RBC: 4.5 MIL/uL (ref 3.87–5.11)

## 2011-12-26 LAB — BASIC METABOLIC PANEL
Chloride: 93 mEq/L — ABNORMAL LOW (ref 96–112)
GFR calc Af Amer: >90 mL/min (ref >90)
GFR calc non Af Amer: >90 mL/min (ref >90)
Potassium: 4.6 mEq/L (ref 3.5–5.1)
Sodium: 134 mEq/L — ABNORMAL LOW (ref 135–145)

## 2011-12-26 LAB — DIFFERENTIAL
Basophils Relative: 1 % (ref 0–1)
Eosinophils Absolute: 0.1 10*3/uL (ref 0.0–0.7)
Lymphs Abs: 2.4 10*3/uL (ref 0.7–4.0)
Neutro Abs: 5.8 10*3/uL (ref 1.7–7.7)
Neutrophils Relative %: 66 % (ref 43–77)

## 2011-12-26 LAB — POCT I-STAT TROPONIN I

## 2011-12-26 NOTE — ED Notes (Addendum)
PT. REPORTS MID CHEST PAIN WITH SOB AND PRODUCTIVE COUGH / NAUSEA FOR SEVERAL DAYS, STATES HISTORY OF COPD .

## 2011-12-26 NOTE — ED Notes (Signed)
Patient transported to X-ray.  Stable at the time she left in wheelchair and with  4L

## 2011-12-27 ENCOUNTER — Emergency Department (HOSPITAL_COMMUNITY): Payer: Medicaid Other

## 2011-12-27 ENCOUNTER — Encounter (HOSPITAL_COMMUNITY): Payer: Self-pay | Admitting: Radiology

## 2011-12-27 ENCOUNTER — Other Ambulatory Visit: Payer: Self-pay

## 2011-12-27 DIAGNOSIS — J9612 Chronic respiratory failure with hypercapnia: Secondary | ICD-10-CM | POA: Diagnosis present

## 2011-12-27 DIAGNOSIS — I10 Essential (primary) hypertension: Secondary | ICD-10-CM

## 2011-12-27 LAB — POCT I-STAT 3, ART BLOOD GAS (G3+)
Patient temperature: 98.6
pH, Arterial: 7.22 — ABNORMAL LOW (ref 7.350–7.400)

## 2011-12-27 LAB — CREATININE, SERUM
Creatinine, Ser: 0.66 mg/dL (ref 0.50–1.10)
GFR calc non Af Amer: >90 mL/min (ref >90)

## 2011-12-27 LAB — CBC
MCH: 30.5 pg (ref 26.0–34.0)
MCHC: 31.9 g/dL (ref 30.0–36.0)
RDW: 15.3 % (ref 11.5–15.5)

## 2011-12-27 LAB — CARDIAC PANEL(CRET KIN+CKTOT+MB+TROPI)
Relative Index: INVALID (ref 0.0–2.5)
Relative Index: INVALID (ref 0.0–2.5)
Total CK: 59 U/L (ref 7–177)
Total CK: 61 U/L (ref 7–177)
Troponin I: <0.3 ng/mL (ref <0.30)
Troponin I: <0.3 ng/mL (ref <0.30)

## 2011-12-27 MED ORDER — ENOXAPARIN SODIUM 40 MG/0.4ML ~~LOC~~ SOLN
40.0000 mg | SUBCUTANEOUS | Status: DC
  Administered 2011-12-27 – 2011-12-29 (×3): 40 mg via SUBCUTANEOUS
  Filled 2011-12-27 (×4): qty 0.4

## 2011-12-27 MED ORDER — ALBUTEROL (5 MG/ML) CONTINUOUS INHALATION SOLN
10.0000 mg/h | INHALATION_SOLUTION | Freq: Once | RESPIRATORY_TRACT | Status: AC
  Administered 2011-12-27: 10 mg/h via RESPIRATORY_TRACT
  Filled 2011-12-27: qty 20

## 2011-12-27 MED ORDER — ACETAMINOPHEN 325 MG PO TABS: 650.0000 mg | ORAL_TABLET | Freq: Four times a day (QID) | ORAL | Status: DC | PRN

## 2011-12-27 MED ORDER — AMLODIPINE BESYLATE 10 MG PO TABS
10.0000 mg | ORAL_TABLET | Freq: Every day | ORAL | Status: DC
  Administered 2011-12-27 – 2011-12-30 (×4): 10 mg via ORAL
  Filled 2011-12-27 (×4): qty 1

## 2011-12-27 MED ORDER — LEVOFLOXACIN IN D5W 750 MG/150ML IV SOLN
750.0000 mg | Freq: Once | INTRAVENOUS | Status: AC
  Administered 2011-12-27: 750 mg via INTRAVENOUS
  Filled 2011-12-27: qty 150

## 2011-12-27 MED ORDER — GUAIFENESIN ER 600 MG PO TB12
600.0000 mg | ORAL_TABLET | Freq: Two times a day (BID) | ORAL | Status: DC
  Administered 2011-12-27 – 2011-12-30 (×6): 600 mg via ORAL
  Filled 2011-12-27 (×7): qty 1

## 2011-12-27 MED ORDER — SODIUM CHLORIDE 0.9 % IV SOLN
INTRAVENOUS | Status: DC
  Administered 2011-12-27 – 2011-12-28 (×4): via INTRAVENOUS

## 2011-12-27 MED ORDER — METHYLPREDNISOLONE SODIUM SUCC 125 MG IJ SOLR
125.0000 mg | Freq: Four times a day (QID) | INTRAMUSCULAR | Status: DC
  Administered 2011-12-27 – 2011-12-30 (×11): 125 mg via INTRAVENOUS
  Filled 2011-12-27 (×15): qty 2

## 2011-12-27 MED ORDER — IPRATROPIUM BROMIDE 0.02 % IN SOLN
0.5000 mg | Freq: Once | RESPIRATORY_TRACT | Status: AC
  Administered 2011-12-27: 0.5 mg via RESPIRATORY_TRACT
  Filled 2011-12-27: qty 2.5

## 2011-12-27 MED ORDER — METHYLPREDNISOLONE SODIUM SUCC 125 MG IJ SOLR
125.0000 mg | Freq: Once | INTRAMUSCULAR | Status: AC
  Administered 2011-12-27: 125 mg via INTRAVENOUS
  Filled 2011-12-27: qty 2

## 2011-12-27 MED ORDER — LORAZEPAM 2 MG/ML IJ SOLN
0.5000 mg | Freq: Once | INTRAMUSCULAR | Status: AC
  Administered 2011-12-27: 0.5 mg via INTRAVENOUS

## 2011-12-27 MED ORDER — ACETAMINOPHEN 650 MG RE SUPP: 650.0000 mg | Freq: Four times a day (QID) | RECTAL | Status: DC | PRN

## 2011-12-27 MED ORDER — HYDROCODONE-ACETAMINOPHEN 5-325 MG PO TABS
1.0000 | ORAL_TABLET | ORAL | Status: DC | PRN
  Administered 2011-12-28: 2 via ORAL
  Filled 2011-12-27: qty 2

## 2011-12-27 MED ORDER — LEVOFLOXACIN IN D5W 750 MG/150ML IV SOLN: 750.0000 mg | INTRAVENOUS | Status: DC

## 2011-12-27 MED ORDER — IOHEXOL 350 MG/ML SOLN
100.0000 mL | Freq: Once | INTRAVENOUS | Status: AC | PRN
  Administered 2011-12-27: 100 mL via INTRAVENOUS

## 2011-12-27 MED ORDER — ALBUTEROL SULFATE (5 MG/ML) 0.5% IN NEBU
2.5000 mg | INHALATION_SOLUTION | RESPIRATORY_TRACT | Status: DC | PRN
  Filled 2011-12-27: qty 0.5

## 2011-12-27 MED ORDER — LEVOFLOXACIN IN D5W 750 MG/150ML IV SOLN
750.0000 mg | INTRAVENOUS | Status: DC
  Administered 2011-12-27 – 2011-12-30 (×4): 750 mg via INTRAVENOUS
  Filled 2011-12-27 (×3): qty 150

## 2011-12-27 MED ORDER — NICOTINE 14 MG/24HR TD PT24
14.0000 mg | MEDICATED_PATCH | TRANSDERMAL | Status: DC
  Administered 2011-12-27 – 2011-12-29 (×3): 14 mg via TRANSDERMAL
  Filled 2011-12-27 (×4): qty 1

## 2011-12-27 MED ORDER — IPRATROPIUM BROMIDE 0.02 % IN SOLN
0.5000 mg | Freq: Four times a day (QID) | RESPIRATORY_TRACT | Status: DC
  Administered 2011-12-27 – 2011-12-28 (×4): 0.5 mg via RESPIRATORY_TRACT
  Filled 2011-12-27 (×4): qty 2.5

## 2011-12-27 MED ORDER — THIAMINE HCL 100 MG/ML IJ SOLN
Freq: Once | INTRAVENOUS | Status: AC
  Administered 2011-12-27: 20:00:00 via INTRAVENOUS
  Filled 2011-12-27 (×2): qty 1000

## 2011-12-27 MED ORDER — PNEUMOCOCCAL VAC POLYVALENT 25 MCG/0.5ML IJ INJ
0.5000 mL | INJECTION | INTRAMUSCULAR | Status: AC
  Administered 2011-12-28: 0.5 mL via INTRAMUSCULAR
  Filled 2011-12-27: qty 0.5

## 2011-12-27 MED ORDER — DEXTROSE-NACL 5-0.45 % IV SOLN: INTRAVENOUS | Status: DC

## 2011-12-27 MED ORDER — PANTOPRAZOLE SODIUM 40 MG PO TBEC
40.0000 mg | DELAYED_RELEASE_TABLET | Freq: Every day | ORAL | Status: DC
  Administered 2011-12-27 – 2011-12-30 (×4): 40 mg via ORAL
  Filled 2011-12-27 (×4): qty 1

## 2011-12-27 MED ORDER — LORAZEPAM 2 MG/ML IJ SOLN
INTRAMUSCULAR | Status: AC
  Administered 2011-12-27: 06:00:00
  Filled 2011-12-27: qty 1

## 2011-12-27 MED ORDER — ALBUTEROL SULFATE (5 MG/ML) 0.5% IN NEBU
2.5000 mg | INHALATION_SOLUTION | Freq: Four times a day (QID) | RESPIRATORY_TRACT | Status: DC
  Administered 2011-12-27 – 2011-12-28 (×4): 2.5 mg via RESPIRATORY_TRACT
  Filled 2011-12-27 (×4): qty 0.5

## 2011-12-27 MED ORDER — MORPHINE SULFATE 2 MG/ML IJ SOLN
1.0000 mg | INTRAMUSCULAR | Status: DC | PRN
  Administered 2011-12-27 – 2011-12-28 (×2): 1 mg via INTRAVENOUS
  Filled 2011-12-27 (×2): qty 1

## 2011-12-27 MED ORDER — INFLUENZA VIRUS VACC SPLIT PF IM SUSP
0.5000 mL | Freq: Once | INTRAMUSCULAR | Status: AC
  Administered 2011-12-28: 0.5 mL via INTRAMUSCULAR
  Filled 2011-12-27: qty 0.5

## 2011-12-27 MED ORDER — ALBUTEROL SULFATE (5 MG/ML) 0.5% IN NEBU
5.0000 mg | INHALATION_SOLUTION | Freq: Once | RESPIRATORY_TRACT | Status: AC
  Administered 2011-12-27: 5 mg via RESPIRATORY_TRACT
  Filled 2011-12-27: qty 1

## 2011-12-27 NOTE — ED Notes (Signed)
Pt reports having increase difficulty breathing, MD made aware and at bedside. Pt sats are 88-90% on breathing treatment. Respiratory called for bipap. Will continue to monitor pt.

## 2011-12-27 NOTE — H&P (Signed)
Karina Weber is an 56 y.o. female.   Chief Complaint: Chest Pain/SOB HPI: 56 yo with history of Lung cancer S/P Radiation as well as known COPD on home O2 at 2L/min here with progressive SOB, wheze and chest pain. Patient was found to be hypoxemic with PCO2 in the 90s (normally around 50s). She has tried Nebs at home without success. She is also refusing BIPAP and CPAP. Had some cough associated with her symptoms.  Past Medical History  Diagnosis Date  . Alcoholism   . Tobacco abuse   . Depression   . COPD (chronic obstructive pulmonary disease)   . Hypertension   . History of radiation therapy 07/19/10 to 07/28/10    RUL lung  . 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 No pertinent past surgical history.  Family History  Problem Relation Age of Onset  . Heart disease Neg Hx   . Cancer Mother     unknown type   Social History:  reports that she has been smoking Cigarettes.  She has a 20 pack-year smoking history. She does not have any smokeless tobacco history on file. She reports that she does not drink alcohol or use illicit drugs.  Allergies:  Allergies  Allergen Reactions  . Lisinopril Cough    Medications Prior to Admission  Medication Dose Route Frequency Provider Last Rate Last Dose  . albuterol (PROVENTIL,VENTOLIN) solution continuous neb  10 mg/hr Nebulization Once Forbes Cellar, MD   10 mg/hr at 12/27/11 0339  . iohexol (OMNIPAQUE) 350 MG/ML injection 100 mL  100 mL Intravenous Once PRN Medication Radiologist, MD   100 mL at 12/27/11 0356  . ipratropium (ATROVENT) nebulizer solution 0.5 mg  0.5 mg Nebulization Once Forbes Cellar, MD   0.5 mg at 12/27/11 0339  . Levofloxacin (LEVAQUIN) IVPB 750 mg  750 mg Intravenous Q24H Forbes Cellar, MD   750 mg at 12/27/11 0716  . Levofloxacin (LEVAQUIN) IVPB 750 mg  750 mg Intravenous Once Forbes Cellar, MD      . LORazepam (ATIVAN) 2 MG/ML injection           . LORazepam (ATIVAN) injection 0.5 mg   0.5 mg Intravenous Once Forbes Cellar, MD   0.5 mg at 12/27/11 0444  . methylPREDNISolone sodium succinate (SOLU-MEDROL) 125 mg/2 mL injection 125 mg  125 mg Intravenous Once Forbes Cellar, MD   125 mg at 12/27/11 0324   Medications Prior to Admission  Medication Sig Dispense Refill  . albuterol (PROVENTIL) (2.5 MG/3ML) 0.083% nebulizer solution Take 2.5 mg by nebulization every 6 (six) hours as needed for wheezing.  75 mL  12  . aspirin 81 MG tablet Take 1 tablet (81 mg total) by mouth daily.  31 tablet  1  . Fluticasone-Salmeterol (ADVAIR) 250-50 MCG/DOSE AEPB Inhale 1 puff into the lungs every 12 (twelve) hours.  60 each  11  . hydrochlorothiazide (HYDRODIURIL) 25 MG tablet Take 1 tablet (25 mg total) by mouth daily.  31 tablet  3  . ipratropium (ATROVENT) 0.02 % nebulizer solution Take 2.5 mLs (500 mcg total) by nebulization 4 (four) times daily.  75 mL  11  . metoprolol tartrate (LOPRESSOR) 25 MG tablet Take 0.5 tablets (12.5 mg total) by mouth 2 (two) times daily.  90 tablet  1  . VENTOLIN HFA 108 (90 BASE) MCG/ACT inhaler INHALE 2 PUFFS INTO THE LUNGS EVERY 6 (SIX) HOURS AS NEEDED.  18 g  0    Results  for orders placed during the hospital encounter of 12/26/11 (from the past 48 hour(s))  CBC     Status: Normal   Collection Time   12/26/11 10:09 PM      Component Value Range Comment   WBC 8.8  4.0 - 10.5 (K/uL)    RBC 4.50  3.87 - 5.11 (MIL/uL)    Hemoglobin 14.1  12.0 - 15.0 (g/dL)    HCT 16.1  09.6 - 04.5 (%)    MCV 94.7  78.0 - 100.0 (fL)    MCH 31.3  26.0 - 34.0 (pg)    MCHC 33.1  30.0 - 36.0 (g/dL)    RDW 40.9  81.1 - 91.4 (%)    Platelets 323  150 - 400 (K/uL)   DIFFERENTIAL     Status: Normal   Collection Time   12/26/11 10:09 PM      Component Value Range Comment   Neutrophils Relative 66  43 - 77 (%)    Neutro Abs 5.8  1.7 - 7.7 (K/uL)    Lymphocytes Relative 27  12 - 46 (%)    Lymphs Abs 2.4  0.7 - 4.0 (K/uL)    Monocytes Relative 5  3 - 12 (%)    Monocytes  Absolute 0.5  0.1 - 1.0 (K/uL)    Eosinophils Relative 1  0 - 5 (%)    Eosinophils Absolute 0.1  0.0 - 0.7 (K/uL)    Basophils Relative 1  0 - 1 (%)    Basophils Absolute 0.1  0.0 - 0.1 (K/uL)   BASIC METABOLIC PANEL     Status: Abnormal   Collection Time   12/26/11 10:09 PM      Component Value Range Comment   Sodium 134 (*) 135 - 145 (mEq/L)    Potassium 4.6  3.5 - 5.1 (mEq/L)    Chloride 93 (*) 96 - 112 (mEq/L)    CO2 30  19 - 32 (mEq/L)    Glucose, Bld 121 (*) 70 - 99 (mg/dL)    BUN 16  6 - 23 (mg/dL)    Creatinine, Ser 7.82  0.50 - 1.10 (mg/dL)    Calcium 9.8  8.4 - 10.5 (mg/dL)    GFR calc non Af Amer >90  >90 (mL/min)    GFR calc Af Amer >90  >90 (mL/min)   POCT I-STAT TROPONIN I     Status: Normal   Collection Time   12/26/11 11:20 PM      Component Value Range Comment   Troponin i, poc 0.00  0.00 - 0.08 (ng/mL)    Comment 3            POCT I-STAT 3, BLOOD GAS (G3+)     Status: Abnormal   Collection Time   12/27/11  5:12 AM      Component Value Range Comment   pH, Arterial 7.220 (*) 7.350 - 7.400     pCO2 arterial 90.7 (*) 35.0 - 45.0 (mmHg)    pO2, Arterial 77.0 (*) 80.0 - 100.0 (mmHg)    Bicarbonate 37.1 (*) 20.0 - 24.0 (mEq/L)    TCO2 40  0 - 100 (mmol/L)    O2 Saturation 91.0      Acid-Base Excess 6.0 (*) 0.0 - 2.0 (mmol/L)    Patient temperature 98.6 F      Collection site RADIAL, ALLEN'S TEST ACCEPTABLE      Drawn by Operator      Sample type ARTERIAL      Comment NOTIFIED PHYSICIAN  Dg Chest 2 View  12/27/2011  *RADIOLOGY REPORT*  Clinical Data: Chest pain and shortness of breath.  History of lung cancer.  CHEST - 2 VIEW  Comparison: 03/14/2011  Findings: Upper limits normal heart size again noted. Streaky opacity within the medial right upper lobe is unchanged. COPD/emphysema again noted. There is no evidence of airspace disease, pleural effusion, new pulmonary opacity, or pneumothorax. No acute bony abnormalities are identified.  IMPRESSION: No evidence of  acute cardiopulmonary disease.  COPD/emphysema.  Original Report Authenticated By: Rosendo Gros, M.D.   Ct Angio Chest W/cm &/or Wo Cm  12/27/2011  *RADIOLOGY REPORT*  Clinical Data: Mid chest pain and shortness of breath; cough. History of lung cancer.  CT ANGIOGRAPHY CHEST  Technique:  Multidetector CT imaging of the chest using the standard protocol during bolus administration of intravenous contrast. Multiplanar reconstructed images including MIPs were obtained and reviewed to evaluate the vascular anatomy.  Contrast: OMNIPAQUE IOHEXOL 350 MG/ML IV SOLN  Comparison: Chest radiograph performed 12/26/2011, and CT of the chest performed 12/21/2011  Findings: There is no evidence of pulmonary embolus.  Mild emphysema is again noted, particularly at the upper lung lobes.  The right upper lobe posterior pulmonary nodule is stable in size, measuring approximately 1.6 x 1.0 cm.  Scattered additional smaller nodular densities within both lungs are grossly unchanged in appearance.  No new airspace consolidation is seen; there is some scarring near the right upper lobe nodule.  There is no evidence of pleural effusion or pneumothorax.  No abnormal focal contrast enhancement is seen.  The enlarged precarinal node is mildly decreased in size, measuring 1.4 cm in short axis.  No additional mediastinal lymphadenopathy is seen.  No pericardial effusion is identified.  Diffuse coronary artery calcifications are noted.  The great vessels are grossly unremarkable in appearance.  No axillary lymphadenopathy is seen. The visualized portions of the thyroid gland are unremarkable in appearance.  The visualized portions of the liver and spleen are unremarkable. The visualized portions of the pancreas, stomach, adrenal glands and kidneys are within normal limits.  There is prominent calcification along the proximal abdominal aorta, and along the proximal renal arteries and superior mesenteric artery.  No acute osseous  abnormalities are seen.  IMPRESSION:  1.  No evidence of pulmonary embolus. 2.  Mild emphysema noted, particularly at the upper lung lobes. 3.  Stable appearance to 1.6 cm right upper lobe posterior pulmonary nodule; scattered smaller nodular densities within both lungs are grossly unchanged in appearance. 4.  Enlarged precarinal node measures 1.4 cm in short axis, mildly decreased from the prior study. 5.  Diffuse coronary artery calcifications seen. 6.  Prominent calcification along the proximal abdominal aorta, and along the proximal renal arteries and superior mesenteric artery.  Original Report Authenticated By: Tonia Ghent, M.D.    Review of Systems  Respiratory: Positive for cough, sputum production, shortness of breath and wheezing. Negative for hemoptysis.   Cardiovascular: Positive for chest pain. Negative for palpitations.  All other systems reviewed and are negative.    Blood pressure 157/85, pulse 105, temperature 98.8 F (37.1 C), temperature source Oral, resp. rate 22, SpO2 92.00%. Physical Exam  Constitutional: She is oriented to person, place, and time. She appears well-developed and well-nourished.  HENT:  Head: Normocephalic and atraumatic.  Right Ear: External ear normal.  Left Ear: External ear normal.  Nose: Nose normal.  Mouth/Throat: Oropharynx is clear and moist.  Eyes: Conjunctivae and EOM are normal. Pupils are equal, round,  and reactive to light.  Neck: Normal range of motion. Neck supple.  Cardiovascular: Normal rate, regular rhythm, normal heart sounds and intact distal pulses.   Respiratory: She is in respiratory distress. She has wheezes. She has rales.  GI: Soft. Bowel sounds are normal.  Musculoskeletal: Normal range of motion.  Neurological: She is alert and oriented to person, place, and time. She has normal reflexes.  Skin: Skin is warm and dry.  Psychiatric: She has a normal mood and affect. Her behavior is normal. Judgment and thought content  normal.     Assessment/Plan 1. Acute on chronic respiratory failure: from COPD exacerbation. Will admit the patient to step down, IV steroids, Oxygen, nebulizers and antibiotics. Patient needs to be on BIPAP but refusing. Will try NRB and if she is not improving will address the need for BIPAP again. 2. COPD exacerbation: As above 3. HTN: Continue home medications 4. Hx of Lung cancer: Has not been compliant with follow up. CT chest today did not show recurrence/worsening. Encourage patient to follow up with Oncologist at [facility].  Yameli Delamater,LAWAL 12/27/2011, 7:51 AM

## 2011-12-27 NOTE — ED Notes (Signed)
Respiratory at bedside to place pt on bipap

## 2011-12-27 NOTE — ED Notes (Signed)
Continuous breathing treatment in use. States she is feeling better.

## 2011-12-27 NOTE — ED Notes (Signed)
Called lab for critical labs verification, critical labs not for this pt. MD made aware, will continue to monitor pt.

## 2011-12-27 NOTE — ED Notes (Signed)
Critical labs called from laboratory, Glucose 1275, K+ 6.4 and CO2 < 5. MD made aware and to follow up, pt in CT testing at this time.

## 2011-12-27 NOTE — Progress Notes (Signed)
Transport came for pt to go to CT. RT will begin 10mg  neb upon return.

## 2011-12-27 NOTE — ED Notes (Signed)
Assisted patient back to bed from bathroom, off o2 sats drop to low 70's, patient shows no signs of increased sob. Patient c/o being hurgry, food tray ordered.

## 2011-12-27 NOTE — ED Notes (Signed)
Respiratory at bedside for breathing treatment.

## 2011-12-27 NOTE — ED Notes (Signed)
Admitting team at the bedside. Pt turned down to 3L [place], pt tolerating well.

## 2011-12-27 NOTE — ED Notes (Signed)
[phone]

## 2011-12-27 NOTE — H&P (Signed)
Hospital Admission Note Date: 12/27/2011  Patient name:  Karina Weber   Medical record number:  [id number] Date of birth:  13-Aug-1956  Age: 56 y.o. Gender:  female PCP:    Leodis Sias, MD, MD  Medical Service:   Internal Medicine Teaching Service   Attending physician:  Dr. Rogelia Boga  First Contact:   Dr. Dorise Hiss  Pager: [phone]                                                 Second Contact:   Dr. Dorthula Rue  Pager: [phone] After Hours:    First Contact   Pager: [phone]      Second Contact  Pager: [phone]   Chief Complaint: shortness of breath  History of Present Illness: Patient is a 56 y.o. 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 Patient has severe COPD at baseline. She is unable to walk 10-15 steps without getting short of breath. She uses albuterol multiple times during the day on her good days. She uses albuterol nebulizer constantly when she is working around the house. She uses 2 L oxygen by nasal cannula at bedtime. She does not use any other inhaler like Advair or Spiriva. Since past 3-4 days, her albuterol requirement has gone up significantly. She's not getting any relief with albuterol MDI or nebulizers. She does not have anyone to take care of her at home she has been in the bed most of the day. Her last by mouth intake was yesterday morning. She did not call the clinic or come to the emergency department earlier hoping that she will start feeling better but she could not take it anymore yesterday and came to the emergency department in the evening. She's also been having some chest discomfort mostly in the epigastric area which she describes as burning. She has been dizzy and lightheaded the onset of symptoms. She's also been coughing and coughing up whitish phlegm in the past 2-3 days. She's been out of her albuterol 2 days ago and  has just been using nebulizers almost throughout the day. She continues to smoke and her last cigarette was yesterday morning.  Her other complaints are feeling that food gets stuck in the epigastric area since the last 4-5 months. Even water gets stuck and she has to drink it may slowly otherwise she would vomit it. This has reduced her appetite and by mouth intake significantly 3-4 months. This has not been evaluated. She feels weak and tired throughout the day and says that she has lost weight (3 pounds in our system since August 2012)  Current Outpatient Medications: Current Facility-Administered Medications  Medication Dose Route Frequency Provider Last Rate Last Dose   Current Outpatient Prescriptions  Medication Sig Dispense Refill  . albuterol (PROVENTIL) (2.5 MG/3ML) 0.083% nebulizer solution Take 2.5 mg by nebulization every 6 (six) hours as needed for wheezing.  75 mL  12  . aspirin 81 MG tablet Take 1 tablet (81 mg total) by mouth daily.  31 tablet  1  . Fluticasone-Salmeterol (ADVAIR) 250-50 MCG/DOSE AEPB Inhale 1 puff into the lungs every 12 (twelve) hours.  60 each  11  . hydrochlorothiazide (HYDRODIURIL) 25 MG tablet Take 1 tablet (25 mg total) by mouth  daily.  31 tablet  3  . ipratropium (ATROVENT) 0.02 % nebulizer solution Take 2.5 mLs (500 mcg total) by nebulization 4 (four) times daily.  75 mL  11  . metoprolol tartrate (LOPRESSOR) 25 MG tablet Take 0.5 tablets (12.5 mg total) by mouth 2 (two) times daily.  90 tablet  1  . VENTOLIN HFA 108 (90 BASE) MCG/ACT inhaler INHALE 2 PUFFS INTO THE LUNGS EVERY 6 (SIX) HOURS AS NEEDED.  18 g  0    Allergies: Allergies  Allergen Reactions  . Lisinopril Cough     Past Medical History: Past Medical History  Diagnosis Date  . Tobacco abuse   . COPD (chronic obstructive pulmonary disease)     exacerbagtion in 6/12 and in 2010.   Marland Kitchen Hypertension   . History of radiation therapy 07/19/10 to 07/28/10    RUL lung  . Lung cancer  05/12/10    Non small cell, RUL  . Alcohol abuse     2 beers a day    Past Surgical History: Past Surgical History  Procedure Date  . No past surgeries     Family History: Family History  Problem Relation Age of Onset  . Heart disease Neg Hx   . Cancer Mother     unknown type    Social History: History   Social History  . Marital Status: Single    Spouse Name: N/A    Number of Children: N/A  . Years of Education: N/A   Occupational History  . Not on file.   Social History Main Topics  . Smoking status: Smoker, Current Status Unknown -- 1.0 packs/day for 40 years    Types: Cigarettes  . Smokeless tobacco: Never Used   Comment: Cut back from 2 ppd when diagnosed with Lung cancer.12/02/11 1 cig/day  . Alcohol Use: No     Former alcoholic  . Drug Use: No  . Sexually Active: Not Currently   Other Topics Concern  . Not on file   Social History Narrative   Lives at home with a roommate (the roommate's son had a kid with the patient's daughter but they are not married). She takes care of herself, does not have any assistance when she is sick. He is able to perform ADLs when she is not sick . Used to work in [employer], retired 2 years ago . Said it up to 10th grade . SHe on Medicaid now. Smokes 1 pack per day. Last smoke yesterday. Drinks 2 cans of beer every day. Never did drugs .    Review of Systems:  as per history of present illness   Vital Signs:  Filed Vitals:   12/27/11 0830  BP: 137/87  Pulse: 105-112  Temp: 98.1   O2 sat 80 % RA, 90% 5L [place]  Resp: 20    Physical Exam: General: Vital signs reviewed and noted. malnourished woman, no acute distress, able to speak in full sentences  Head: Normocephalic, atraumatic.  Eyes: PERRL, EOMI, No signs of anemia or jaundice.  Nose: Mucous membranesdry, not inflammed, nonerythematous.  Throat: Oropharynx nonerythematous, no exudate appreciated.   Neck: No deformities, masses, or tenderness noted.Supple, No  carotid Bruits, no JVD.  Lungs:  Normal respiratory effort. B/l wheezes diffusely in upper lung fields with prolonged expiration  Heart: RRR. S1 and S2 tachycardia, no murmur, or rubs.  Abdomen:  BS normoactive. Soft, Nondistended, non-tender.  No masses or organomegaly.  Extremities: No pretibial edema.no clubbing  Neurologic: A&O X3, CN II -  XII are grossly intact. Motor strength is 5/5 in the all 4 extremities, Sensations intact to light touch, Cerebellar signs negative.  Skin: No visible rashes, scars.dry skin   Lab results: Basic Metabolic Panel: Recent Labs  [facility] 12/26/11 2209   NA 134*   K 4.6   CL 93*   CO2 30   GLUCOSE 121*   BUN 16   CREATININE 0.52   CALCIUM 9.8   MG --   PHOS --   CBC: Recent Labs  [facility] 12/26/11 2209   WBC 8.8   NEUTROABS 5.8   HGB 14.1   HCT 42.6   MCV 94.7   PLT 323   ANC- 66   Lab 12/27/11 0512  PHART 7.220*  PCO2ART 90.7*  PO2ART 77.0*  HCO3 37.1*  TCO2 40  O2SAT 91.0    Imaging results:  Dg Chest 2 View  12/27/2011  *RADIOLOGY REPORT*  Clinical Data: Chest pain and shortness of breath.  History of lung cancer.  CHEST - 2 VIEW  Comparison: 03/14/2011  Findings: Upper limits normal heart size again noted. Streaky opacity within the medial right upper lobe is unchanged. COPD/emphysema again noted. There is no evidence of airspace disease, pleural effusion, new pulmonary opacity, or pneumothorax. No acute bony abnormalities are identified.  IMPRESSION: No evidence of acute cardiopulmonary disease.  COPD/emphysema.  Original Report Authenticated By: Rosendo Gros, M.D.   Ct Angio Chest W/cm &/or Wo Cm  12/27/2011  *RADIOLOGY REPORT*  Clinical Data: Mid chest pain and shortness of breath; cough. History of lung cancer.  CT ANGIOGRAPHY CHEST  Technique:  Multidetector CT imaging of the chest using the standard protocol during bolus administration of intravenous contrast. Multiplanar reconstructed images including MIPs were obtained  and reviewed to evaluate the vascular anatomy.  Contrast: OMNIPAQUE IOHEXOL 350 MG/ML IV SOLN  Comparison: Chest radiograph performed 12/26/2011, and CT of the chest performed 12/21/2011  Findings: There is no evidence of pulmonary embolus.  Mild emphysema is again noted, particularly at the upper lung lobes.  The right upper lobe posterior pulmonary nodule is stable in size, measuring approximately 1.6 x 1.0 cm.  Scattered additional smaller nodular densities within both lungs are grossly unchanged in appearance.  No new airspace consolidation is seen; there is some scarring near the right upper lobe nodule.  There is no evidence of pleural effusion or pneumothorax.  No abnormal focal contrast enhancement is seen.  The enlarged precarinal node is mildly decreased in size, measuring 1.4 cm in short axis.  No additional mediastinal lymphadenopathy is seen.  No pericardial effusion is identified.  Diffuse coronary artery calcifications are noted.  The great vessels are grossly unremarkable in appearance.  No axillary lymphadenopathy is seen. The visualized portions of the thyroid gland are unremarkable in appearance.  The visualized portions of the liver and spleen are unremarkable. The visualized portions of the pancreas, stomach, adrenal glands and kidneys are within normal limits.  There is prominent calcification along the proximal abdominal aorta, and along the proximal renal arteries and superior mesenteric artery.  No acute osseous abnormalities are seen.  IMPRESSION:  1.  No evidence of pulmonary embolus. 2.  Mild emphysema noted, particularly at the upper lung lobes. 3.  Stable appearance to 1.6 cm right upper lobe posterior pulmonary nodule; scattered smaller nodular densities within both lungs are grossly unchanged in appearance. 4.  Enlarged precarinal node measures 1.4 cm in short axis, mildly decreased from the prior study. 5.  Diffuse coronary artery calcifications  seen. 6.  Prominent  calcification along the proximal abdominal aorta, and along the proximal renal arteries and superior mesenteric artery.  Original Report Authenticated By: Tonia Ghent, M.D.     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 She is mentating well and able to speak in full sentences but has diffuse wheezing bilaterally on upper lung fields after one albuterol/ipratropium breathing treatment. She is descending and 80% on room air and 88- 90 percent on 5 L nasal cannula. ABG shows respiratory acidosis with low oxygenation .She is unable to tolerate BiPAP. She is hypertensive and tachycardic and tachypneic. Chest x-ray shows emphysema.  #1 COPD exacerbation - Most likely secondary to progression of COPD, continued smoking, suboptimal COPD regimen and medication noncompliance - Currently hemodynamically stable but persistent hypoxia makes her at risk for decompensation and need for intubation later today  Plan - Admit to step down unit - Maintain oxygen saturation above 88% using nasal cannula or nonrebreather. If required, may try BiPAP again - Albuterol/ipratropium nebulizer every 6 hours scheduled - Solu-Medrol 125 mg every 6 hours IV - Continue Levaquin IV started last night - Mucinex every 12 hours and flutter valve every 12 hours - DC aspirin and beta blocker to prevent and worsening bronchoconstriction from these medicines  - Repeat ABG tomorrow morning  -  #2 hypertension  - Moderately high since arrival  in 150s to 160s systolic - DC beta blocker - Start Norvasc at 10 mg daily  #3 adenocarcinoma of the lung - Stable based on the CT scan done in the emergency department - Encourage outpatient followup with Dr. Mitzi Hansen  #4 dysphagia - Patient has been having poor by mouth intake since  past 2 days and has his prolonged dysphasia that needs to be evaluated - Will hold diuretic at this time because she is clinically dry and continue IV fluid resuscitation - Once clinically stable, may pursue barium studies 4 dysphagia   5. Tobacco/alcohol abuse- cessation counseling, nicotine patches   6.  DVT PPX - lovenox  7. Code- full code    Bethel Born, M.D. (PGY3):  ____________________________________    Date/ Time:     ____________________________________        I have seen and examined the patient. I reviewed the resident/fellow note and agree with the findings and plan of care as documented. My additions and revisions are included.   Signature:  ____________________________________________     Internal Medicine Teaching Service Attending    Date:    ____________________________________________

## 2011-12-27 NOTE — Progress Notes (Signed)
RT attempted to place NIV on pt. RT unable to get mask completely secured on pt's face before she began to panic and said she couldn't handle the BIPAP and told us to get it off. Pt given meds to help relax, pt stated she was beginning to feel better. Pt's complaint with increased SOB was that she didn't feel like she was getting enough flow or able to take a deep enough breath. RT placed pt back on 5lpm [place]. Pt states she is really feeling much better. After RT drew ABG, RT noted pt's HR had increased to around 120, RR was up to around 30, BP was elevated, and sats were in upper 80's to low 90's. Pt again stated she was feeling better. MD aware of ABG results. RT will continue to monitor.

## 2011-12-27 NOTE — ED Provider Notes (Signed)
History     CSN: [id number]  Arrival date & time 12/26/11  2302   First MD Initiated Contact with Patient 12/27/11 0235      Chief Complaint  Patient presents with  . Chest Pain    (Consider location/radiation/quality/duration/timing/severity/associated sxs/prior treatment) HPI  55yoF h/o COPD on 2L home oxygen, non small cell lung carcinoma s/p radiation (non compliant with f/u) pw shortness of breath and chest pain. Pt co chest pain x 3-4 days which has been constant and substernal without radiation. No pleurisy. C/O worsening shortness of breath from baseline. No orthopnea/PND. Has been using her albuterol more than usual. +Dry cough. Subj fever +chills. Denies N/V/diaphoresis. C/O chronic lower abdominal /suprapubic pain. Denies hematuria/dysuria/freq/urgency. Denies constipation/diarrhea. No h/o VTE   ED Notes, ED Provider Notes from 12/26/11 0000 to 12/27/11 00:12:25       Threasa Alpha Flueckiger, RN 12/26/2011 23:21      Patient transported to X-ray. Stable at the time she left in wheelchair and with [place] 4L         Nada Libman, RN 12/26/2011 23:07      PT. REPORTS MID CHEST PAIN WITH SOB AND PRODUCTIVE COUGH / NAUSEA FOR SEVERAL DAYS, STATES HISTORY OF COPD .                 Original note by Nada Libman, RN at 12/26/2011 23:06         Nada Libman, RN 12/26/2011 23:06      PT. REPORTS MID CHEST PAIN WITH SOB AND PRODUCTIVE COUGH /     Past Medical History  Diagnosis Date  . Alcoholism   . Tobacco abuse   . Depression   . COPD (chronic obstructive pulmonary disease)   . Hypertension   . History of radiation therapy 07/19/10 to 07/28/10    RUL lung  . 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 No pertinent past surgical history.  Family History  Problem Relation Age of Onset  . Heart disease Neg Hx   . Cancer Mother     unknown type    History  Substance Use Topics  . Smoking status: Smoker, Current  Status Unknown -- 0.5 packs/day for 40 years    Types: Cigarettes  . Smokeless tobacco: Not on file   Comment: Cut back from 2 ppd when diagnosed with Lung cancer.12/02/11 1 cig/day  . Alcohol Use: No     Former alcoholic    OB History    Grav Para Term Preterm Abortions TAB SAB Ect Mult Living                  Review of Systems  All other systems reviewed and are negative.   except as noted HPI   Allergies  Lisinopril  Home Medications   Current Outpatient Rx  Name Route Sig Dispense Refill  . ALBUTEROL SULFATE (2.5 MG/3ML) 0.083% IN NEBU Nebulization Take 2.5 mg by nebulization every 6 (six) hours as needed for wheezing. 75 mL 12  . ASPIRIN 81 MG PO TABS Oral Take 1 tablet (81 mg total) by mouth daily. 31 tablet 1  . FLUTICASONE-SALMETEROL 250-50 MCG/DOSE IN AEPB Inhalation Inhale 1 puff into the lungs every 12 (twelve) hours. 60 each 11  . HYDROCHLOROTHIAZIDE 25 MG PO TABS Oral Take 1 tablet (25 mg total) by mouth daily. 31 tablet 3  . IPRATROPIUM BROMIDE 0.02 % IN SOLN Nebulization Take 2.5 mLs (500 mcg total)  by nebulization 4 (four) times daily. 75 mL 11  . METOPROLOL TARTRATE 25 MG PO TABS Oral Take 0.5 tablets (12.5 mg total) by mouth 2 (two) times daily. 90 tablet 1  . VENTOLIN HFA 108 (90 BASE) MCG/ACT IN AERS  INHALE 2 PUFFS INTO THE LUNGS EVERY 6 (SIX) HOURS AS NEEDED. 18 g 0    BP 194/93  Pulse 113  Temp(Src) 98.8 F (37.1 C) (Oral)  Resp 27  SpO2 90%  Physical Exam  Nursing note and vitals reviewed. Constitutional: She is oriented to person, place, and time. No distress.  HENT:  Head: Atraumatic.  Mouth/Throat: Oropharynx is clear and moist.  Eyes: Conjunctivae and EOM are normal. Pupils are equal, round, and reactive to light.  Neck: Normal range of motion. Neck supple.  Cardiovascular: Regular rhythm, normal heart sounds and intact distal pulses.        tachycardic  Pulmonary/Chest: Effort normal. No respiratory distress. She has wheezes. She has no  rales. She exhibits no tenderness.       Diffuse exp wheeze distant lung sounds  Abdominal: Soft. She exhibits no distension. There is no tenderness. There is no rebound and no guarding.  Musculoskeletal: Normal range of motion. She exhibits no edema and no tenderness.  Neurological: She is alert and oriented to person, place, and time.  Skin: Skin is warm and dry. No rash noted.  Psychiatric: She has a normal mood and affect.    Date: 12/27/2011  Rate: 132  Rhythm: sinus tachycardia  QRS Axis: right  Intervals: normal  ST/T Wave abnormalities: nonspecific ST/T changes  Conduction Disutrbances:none  Narrative Interpretation:   Old EKG Reviewed: new ST depressions lateral leads ?rate dependent   Date: 12/27/2011  Rate: 102  Rhythm: sinus tachycardia  QRS Axis: right  Intervals: normal  ST/T Wave abnormalities: nonspecific T wave changes  Conduction Disutrbances:none  Narrative Interpretation:   Old EKG Reviewed: changes noted lateral ST depressions resolved   ED Course  Procedures (including critical care time)  Labs Reviewed  BASIC METABOLIC PANEL - Abnormal; Notable for the following:    Sodium 134 (*)    Chloride 93 (*)    Glucose, Bld 121 (*)    All other components within normal limits  POCT I-STAT 3, BLOOD GAS (G3+) - Abnormal; Notable for the following:    pH, Arterial 7.220 (*)    pCO2 arterial 90.7 (*)    pO2, Arterial 77.0 (*)    Bicarbonate 37.1 (*)    Acid-Base Excess 6.0 (*)    All other components within normal limits  CBC  DIFFERENTIAL  POCT I-STAT TROPONIN I   Dg Chest 2 View  12/27/2011  *RADIOLOGY REPORT*  Clinical Data: Chest pain and shortness of breath.  History of lung cancer.  CHEST - 2 VIEW  Comparison: 03/14/2011  Findings: Upper limits normal heart size again noted. Streaky opacity within the medial right upper lobe is unchanged. COPD/emphysema again noted. There is no evidence of airspace disease, pleural effusion, new pulmonary opacity, or  pneumothorax. No acute bony abnormalities are identified.  IMPRESSION: No evidence of acute cardiopulmonary disease.  COPD/emphysema.  Original Report Authenticated By: Rosendo Gros, M.D.   Ct Angio Chest W/cm &/or Wo Cm  12/27/2011  *RADIOLOGY REPORT*  Clinical Data: Mid chest pain and shortness of breath; cough. History of lung cancer.  CT ANGIOGRAPHY CHEST  Technique:  Multidetector CT imaging of the chest using the standard protocol during bolus administration of intravenous contrast. Multiplanar reconstructed images  including MIPs were obtained and reviewed to evaluate the vascular anatomy.  Contrast: OMNIPAQUE IOHEXOL 350 MG/ML IV SOLN  Comparison: Chest radiograph performed 12/26/2011, and CT of the chest performed 12/21/2011  Findings: There is no evidence of pulmonary embolus.  Mild emphysema is again noted, particularly at the upper lung lobes.  The right upper lobe posterior pulmonary nodule is stable in size, measuring approximately 1.6 x 1.0 cm.  Scattered additional smaller nodular densities within both lungs are grossly unchanged in appearance.  No new airspace consolidation is seen; there is some scarring near the right upper lobe nodule.  There is no evidence of pleural effusion or pneumothorax.  No abnormal focal contrast enhancement is seen.  The enlarged precarinal node is mildly decreased in size, measuring 1.4 cm in short axis.  No additional mediastinal lymphadenopathy is seen.  No pericardial effusion is identified.  Diffuse coronary artery calcifications are noted.  The great vessels are grossly unremarkable in appearance.  No axillary lymphadenopathy is seen. The visualized portions of the thyroid gland are unremarkable in appearance.  The visualized portions of the liver and spleen are unremarkable. The visualized portions of the pancreas, stomach, adrenal glands and kidneys are within normal limits.  There is prominent calcification along the proximal abdominal aorta, and along  the proximal renal arteries and superior mesenteric artery.  No acute osseous abnormalities are seen.  IMPRESSION:  1.  No evidence of pulmonary embolus. 2.  Mild emphysema noted, particularly at the upper lung lobes. 3.  Stable appearance to 1.6 cm right upper lobe posterior pulmonary nodule; scattered smaller nodular densities within both lungs are grossly unchanged in appearance. 4.  Enlarged precarinal node measures 1.4 cm in short axis, mildly decreased from the prior study. 5.  Diffuse coronary artery calcifications seen. 6.  Prominent calcification along the proximal abdominal aorta, and along the proximal renal arteries and superior mesenteric artery.  Original Report Authenticated By: Tonia Ghent, M.D.     1. ESSENTIAL HYPERTENSION   2. COPD (chronic obstructive pulmonary disease)   3. Acute respiratory failure with hypercapnia     MDM  55yoF pw SOB, cough c/w COPD exacerbation. H/O NSCLC. CTAngio chest negative for PE. She did have an acute worsening of breathing. ABG at that time revealed acute on chronic hypercapnia/respiratory acidosis, hypoxia 60s noted at that time.. Pt refusing/unable to tolerate BiPAP. Given 0.5mg  ativan and still refusing but appears better.  [facility] Given IVF, solumedrol, cont neb previously. Levaquin ordered.         Forbes Cellar, MD 12/27/11 [phone]

## 2011-12-27 NOTE — ED Notes (Signed)
Respiratory attempted putting pt on bipap, pt became more anxious and unable to tolerate bipap. MD made aware and pt medicated per MAR. Pt states "what ever you gave me make me feel a little better". Pt not on bipap at this time, nasal cannula 5L with sats 92% and resp rate 26. Pt still has mild distress noted, will continue to monitor pt.

## 2011-12-28 LAB — CBC
HCT: 33.4 % — ABNORMAL LOW (ref 36.0–46.0)
MCH: 30.8 pg (ref 26.0–34.0)
MCHC: 32 g/dL (ref 30.0–36.0)
RDW: 15.2 % (ref 11.5–15.5)

## 2011-12-28 LAB — COMPREHENSIVE METABOLIC PANEL
Albumin: 2.9 g/dL — ABNORMAL LOW (ref 3.5–5.2)
Alkaline Phosphatase: 111 U/L (ref 39–117)
BUN: 17 mg/dL (ref 6–23)
Calcium: 8.9 mg/dL (ref 8.4–10.5)
GFR calc Af Amer: >90 mL/min (ref >90)
Glucose, Bld: 238 mg/dL — ABNORMAL HIGH (ref 70–99)
Potassium: 4 mEq/L (ref 3.5–5.1)
Total Protein: 6.6 g/dL (ref 6.0–8.3)

## 2011-12-28 LAB — BLOOD GAS, ARTERIAL
Acid-Base Excess: 6.7 mmol/L — ABNORMAL HIGH (ref 0.0–2.0)
Bicarbonate: 32.6 mEq/L — ABNORMAL HIGH (ref 20.0–24.0)
Drawn by: 31787
O2 Content: 4 L/min
pCO2 arterial: 65.6 mmHg (ref 35.0–45.0)
pH, Arterial: 7.317 — ABNORMAL LOW (ref 7.350–7.400)
pO2, Arterial: 67.2 mmHg — ABNORMAL LOW (ref 80.0–100.0)

## 2011-12-28 LAB — CARDIAC PANEL(CRET KIN+CKTOT+MB+TROPI)
CK, MB: 3.8 ng/mL (ref 0.3–4.0)
Total CK: 49 U/L (ref 7–177)

## 2011-12-28 MED ORDER — ENSURE COMPLETE PO LIQD
237.0000 mL | Freq: Two times a day (BID) | ORAL | Status: DC
  Administered 2011-12-28 – 2011-12-30 (×6): 237 mL via ORAL

## 2011-12-28 MED ORDER — IPRATROPIUM BROMIDE 0.02 % IN SOLN
0.5000 mg | Freq: Three times a day (TID) | RESPIRATORY_TRACT | Status: DC
  Administered 2011-12-28 – 2011-12-30 (×6): 0.5 mg via RESPIRATORY_TRACT
  Filled 2011-12-28 (×6): qty 2.5

## 2011-12-28 MED ORDER — FLUTICASONE-SALMETEROL 250-50 MCG/DOSE IN AEPB
1.0000 | INHALATION_SPRAY | Freq: Two times a day (BID) | RESPIRATORY_TRACT | Status: DC
  Administered 2011-12-28 – 2011-12-30 (×5): 1 via RESPIRATORY_TRACT
  Filled 2011-12-28: qty 14

## 2011-12-28 MED ORDER — ALBUTEROL SULFATE (5 MG/ML) 0.5% IN NEBU
2.5000 mg | INHALATION_SOLUTION | Freq: Three times a day (TID) | RESPIRATORY_TRACT | Status: DC
  Administered 2011-12-28 – 2011-12-30 (×6): 2.5 mg via RESPIRATORY_TRACT
  Filled 2011-12-28 (×5): qty 0.5

## 2011-12-28 MED ORDER — MIRTAZAPINE 30 MG PO TABS
30.0000 mg | ORAL_TABLET | Freq: Every day | ORAL | Status: DC
  Administered 2011-12-28: 30 mg via ORAL
  Filled 2011-12-28 (×2): qty 1

## 2011-12-28 NOTE — Progress Notes (Signed)
Medical Student Daily Progress Note  Subjective: Patient is 56yo AA female, history of COPD and adenocarcinoma of the lung, presents to hospital with COPD exacerbation. Karina Weber reports doing better overnight, slept well and is breathing a little better than yesterday. Objective: Vital signs in last 24 hours: Filed Vitals:   12/27/11 2000 12/28/11 0000 12/28/11 0400 12/28/11 0736  BP: 123/72 129/67 131/63   Pulse: 93 91 91   Temp: 98 F (36.7 C) 97.8 F (36.6 C) 98.7 F (37.1 C) 98.4 F (36.9 C)  TempSrc: Oral Oral Oral Oral  Resp: 23 23 20    SpO2: 94% 95% 95%    Weight change:  12/12/11: 92lbs  ; down 3 lbs from 05/20/11  Intake/Output Summary (Last 24 hours) at 12/28/11 0843 Last data filed at 12/28/11 0739  Gross per 24 hour  Intake 1488.34 ml  Output    425 ml  Net 1063.34 ml   Physical Exam: General: resting in bed, malnourished, able to speak full sentences HEENT: normocephalic, PERRL, EOMI, mucous membranes dry, no inflammation, no exudates Neck: no deformities, no bruits, no JVD Lungs: normal respiratory effort; slight wheezes B/L in upper lobes Heart: tachycardia, no murmurs or rubs Abdomen: No masses, no organomegaly, slight tenderness LLQ Extremities: no edema or clubbing Neurologic: A&O x3, CN II-XII intact. Sensations to light touch intact. Skin: no visible rashes; dry skin Lab Results: Basic Metabolic Panel:  Lab 12/28/11 1610 12/27/11 1550 12/26/11 2209  NA 135 -- 134*  K 4.0 -- 4.6  CL 94* -- 93*  CO2 31 -- 30  GLUCOSE 238* -- 121*  BUN 17 -- 16  CREATININE 0.40* 0.66 --  CALCIUM 8.9 -- 9.8  MG -- -- --  PHOS -- -- --   Liver Function Tests:  Lab 12/28/11 0630  AST 12  ALT 9  ALKPHOS 111  BILITOT 0.1*  PROT 6.6  ALBUMIN 2.9*   CBC:  Lab 12/28/11 0630 12/27/11 1550 12/26/11 2209  WBC 5.8 8.7 --  NEUTROABS -- -- 5.8  HGB 10.7* 12.3 --  HCT 33.4* 38.5 --  MCV 96.3 95.5 --  PLT 248 306 --   Cardiac Enzymes:  Lab 12/28/11 0630  12/27/11 2247 12/27/11 1550  CKTOTAL 49 61 59  CKMB 3.8 4.2* 4.9*  CKMBINDEX -- -- --  TROPONINI <0.30 <0.30 <0.30   BNP:  Lab 12/27/11 1550  PROBNP 447.7*   D-Dimer: No results found for this basename: DDIMER:2 in the last 168 hours CBG: No results found for this basename: GLUCAP:6 in the last 168 hours Hemoglobin A1C: No results found for this basename: HGBA1C in the last 168 hours Fasting Lipid Panel: No results found for this basename: CHOL,HDL,LDLCALC,TRIG,CHOLHDL,LDLDIRECT in the last 960 hours Thyroid Function Tests:  Lab 12/27/11 1550  TSH 0.604  T4TOTAL --  FREET4 --  T3FREE --  THYROIDAB --   Coagulation: No results found for this basename: LABPROT:4,INR:4 in the last 168 hours Anemia Panel: No results found for this basename: VITAMINB12,FOLATE,FERRITIN,TIBC,IRON,RETICCTPCT in the last 168 hours Urine Drug Screen: Drugs of Abuse     Component Value Date/Time   LABOPIA NEGATIVE 03/10/2011 0520   LABOPIA NONE DETECTED 11/27/2009 0514   COCAINSCRNUR NEGATIVE 03/10/2011 0520   COCAINSCRNUR NONE DETECTED 11/27/2009 0514   LABBENZ NEGATIVE 03/10/2011 0520   LABBENZ NONE DETECTED 11/27/2009 0514   AMPHETMU NEGATIVE 03/10/2011 0520   AMPHETMU NONE DETECTED 11/27/2009 0514   THCU NONE DETECTED 11/27/2009 0514   LABBARB  Value: NONE DETECTED  DRUG SCREEN FOR MEDICAL PURPOSES ONLY.  IF CONFIRMATION IS NEEDED FOR ANY PURPOSE, NOTIFY LAB WITHIN 5 DAYS.        LOWEST DETECTABLE LIMITS FOR URINE DRUG SCREEN Drug Class       Cutoff (ng/mL) Amphetamine      1000 Barbiturate      200 Benzodiazepine   200 Tricyclics       300 Opiates          300 Cocaine          300 THC              50 11/27/2009 0514    Alcohol Level: No results found for this basename: ETH:2 in the last 168 hours Urinalysis: No results found for this basename: COLORURINE:2,APPERANCEUR:2,LABSPEC:2,PHURINE:2,GLUCOSEU:2,HGBUR:2,BILIRUBINUR:2,KETONESUR:2,PROTEINUR:2,UROBILINOGEN:2,NITRITE:2,LEUKOCYTESUR:2 in the last  168 hours  Micro Results: No results found for this or any previous visit (from the past 240 hour(s)). Studies/Results: Dg Chest 2 View  12/27/2011  *RADIOLOGY REPORT*  Clinical Data: Chest pain and shortness of breath.  History of lung cancer.  CHEST - 2 VIEW  Comparison: 03/14/2011  Findings: Upper limits normal heart size again noted. Streaky opacity within the medial right upper lobe is unchanged. COPD/emphysema again noted. There is no evidence of airspace disease, pleural effusion, new pulmonary opacity, or pneumothorax. No acute bony abnormalities are identified.  IMPRESSION: No evidence of acute cardiopulmonary disease.  COPD/emphysema.  Original Report Authenticated By: Rosendo Gros, M.D.   Ct Angio Chest W/cm &/or Wo Cm  12/27/2011  *RADIOLOGY REPORT*  Clinical Data: Mid chest pain and shortness of breath; cough. History of lung cancer.  CT ANGIOGRAPHY CHEST  Technique:  Multidetector CT imaging of the chest using the standard protocol during bolus administration of intravenous contrast. Multiplanar reconstructed images including MIPs were obtained and reviewed to evaluate the vascular anatomy.  Contrast: OMNIPAQUE IOHEXOL 350 MG/ML IV SOLN  Comparison: Chest radiograph performed 12/26/2011, and CT of the chest performed 12/21/2011  Findings: There is no evidence of pulmonary embolus.  Mild emphysema is again noted, particularly at the upper lung lobes.  The right upper lobe posterior pulmonary nodule is stable in size, measuring approximately 1.6 x 1.0 cm.  Scattered additional smaller nodular densities within both lungs are grossly unchanged in appearance.  No new airspace consolidation is seen; there is some scarring near the right upper lobe nodule.  There is no evidence of pleural effusion or pneumothorax.  No abnormal focal contrast enhancement is seen.  The enlarged precarinal node is mildly decreased in size, measuring 1.4 cm in short axis.  No additional mediastinal lymphadenopathy  is seen.  No pericardial effusion is identified.  Diffuse coronary artery calcifications are noted.  The great vessels are grossly unremarkable in appearance.  No axillary lymphadenopathy is seen. The visualized portions of the thyroid gland are unremarkable in appearance.  The visualized portions of the liver and spleen are unremarkable. The visualized portions of the pancreas, stomach, adrenal glands and kidneys are within normal limits.  There is prominent calcification along the proximal abdominal aorta, and along the proximal renal arteries and superior mesenteric artery.  No acute osseous abnormalities are seen.  IMPRESSION:  1.  No evidence of pulmonary embolus. 2.  Mild emphysema noted, particularly at the upper lung lobes. 3.  Stable appearance to 1.6 cm right upper lobe posterior pulmonary nodule; scattered smaller nodular densities within both lungs are grossly unchanged in appearance. 4.  Enlarged precarinal node measures 1.4 cm in short axis, mildly decreased  from the prior study. 5.  Diffuse coronary artery calcifications seen. 6.  Prominent calcification along the proximal abdominal aorta, and along the proximal renal arteries and superior mesenteric artery.  Original Report Authenticated By: Tonia Ghent, M.D.   Medications:  Scheduled:   . albuterol  2.5 mg Nebulization Q6H  . amLODipine  10 mg Oral Daily  . enoxaparin  40 mg Subcutaneous Q24H  . Fluticasone-Salmeterol  1 puff Inhalation BID  . guaiFENesin  600 mg Oral BID  . influenza  inactive virus vaccine  0.5 mL Intramuscular Once  . ipratropium  0.5 mg Nebulization Q6H  . levofloxacin (LEVAQUIN) IV  750 mg Intravenous Q24H  . levofloxacin (LEVAQUIN) IV  750 mg Intravenous Once  . methylPREDNISolone (SOLU-MEDROL) injection  125 mg Intravenous Q6H  . nicotine  14 mg Transdermal Q24H  . pantoprazole  40 mg Oral Q1200  . pneumococcal 23 valent vaccine  0.5 mL Intramuscular Tomorrow-1000  . general admission iv infusion    Intravenous Once  . DISCONTD: levofloxacin (LEVAQUIN) IV  750 mg Intravenous Q24H   Continuous:   . sodium chloride 100 mL/hr at 12/28/11 0744  . DISCONTD: dextrose 5 % and 0.45% NaCl     WUJ:[id number], acetaminophen, albuterol, HYDROcodone-acetaminophen, morphine Scheduled Meds:   . albuterol  2.5 mg Nebulization Q6H  . albuterol  5 mg Nebulization Once  . amLODipine  10 mg Oral Daily  . enoxaparin  40 mg Subcutaneous Q24H  . guaiFENesin  600 mg Oral BID  . influenza  inactive virus vaccine  0.5 mL Intramuscular Once  . ipratropium  0.5 mg Nebulization Q6H  . ipratropium  0.5 mg Nebulization Once  . levofloxacin (LEVAQUIN) IV  750 mg Intravenous Q24H  . levofloxacin (LEVAQUIN) IV  750 mg Intravenous Once  . methylPREDNISolone (SOLU-MEDROL) injection  125 mg Intravenous Q6H  . nicotine  14 mg Transdermal Q24H  . pantoprazole  40 mg Oral Q1200  . pneumococcal 23 valent vaccine  0.5 mL Intramuscular Tomorrow-1000  . general admission iv infusion   Intravenous Once  . DISCONTD: levofloxacin (LEVAQUIN) IV  750 mg Intravenous Q24H   Continuous Infusions:   . sodium chloride 100 mL/hr at 12/28/11 0744  . DISCONTD: dextrose 5 % and 0.45% NaCl     PRN Meds:.acetaminophen, acetaminophen, albuterol, HYDROcodone-acetaminophen, morphine Assessment/Plan: #Tobacco use: continued smoking; will monitor; patient not ready to quit  #COPD: continue nebs, O2, abx; SpO2 93% on 4L [place]; will resume home advair  #HTN: controlled; continue amlodipine  #History of radiation therapy: stable nodule RUL 1.6cm  #Respiratory failure: continue 02, will monitor sats  #Disposition: will monitor until this afternoon, then assess for transfer to SDU   LOS: 2 days   This is a Psychologist, occupational Note.  The care of the patient was discussed with Dr. Genella Mech  and the assessment and plan formulated with their assistance.  Please see their attached note for official documentation of the daily  encounter.  Lewie Chamber 12/28/2011, 8:43 AM

## 2011-12-28 NOTE — Progress Notes (Signed)
Resident Co-sign Daily Note: I have seen the patient and reviewed the daily progress note by Lewie Chamber MS 3 and discussed the care of the patient with them.  See below for documentation of my findings, assessment, and plans.  Subjective: The patient is breathing a little better this morning but is not back to her usual state of health. She was able to walk to the bathroom without having to stop. Still feels weak. Coughing is still about the same and coughing up thick clear substance. No fevers overnight and no leg swelling. She is having some abdominal discomfort that she says is worse right before she pees if she waits too long and can be exacerbated by walking. This has been chronic for some time and she has never seen a GI doctor for it. She is able to eat and drink but states her appetite is not that great and she would like to try some ensures.   Objective: Vital signs in last 24 hours: Filed Vitals:   12/28/11 0400 12/28/11 0736 12/28/11 0938 12/28/11 1102  BP: 131/63   152/71  Pulse: 91     Temp: 98.7 F (37.1 C) 98.4 F (36.9 C)    TempSrc: Oral Oral    Resp: 20     SpO2: 95%  97%    Physical Exam: General: resting in bed, cheerful,  HEENT: PERRL, EOMI, no scleral icterus Cardiac: RRR, no rubs, murmurs or gallops Pulm: moving better air today than yesterday but still very tight Abd: soft, nontender, nondistended, BS present Ext: warm and well perfused, no pedal edema Neuro: alert and oriented X3, cranial nerves II-XII grossly intact  Lab Results: Reviewed and documented in Electronic Record Micro Results: Reviewed and documented in Electronic Record Studies/Results: Reviewed and documented in Electronic Record  Medications: I have reviewed the patient's current medications. Scheduled Meds:   . albuterol  2.5 mg Nebulization TID  . amLODipine  10 mg Oral Daily  . enoxaparin  40 mg Subcutaneous Q24H  . Fluticasone-Salmeterol  1 puff Inhalation BID  . guaiFENesin   600 mg Oral BID  . influenza  inactive virus vaccine  0.5 mL Intramuscular Once  . ipratropium  0.5 mg Nebulization TID  . levofloxacin (LEVAQUIN) IV  750 mg Intravenous Q24H  . levofloxacin (LEVAQUIN) IV  750 mg Intravenous Once  . methylPREDNISolone (SOLU-MEDROL) injection  125 mg Intravenous Q6H  . mirtazapine  30 mg Oral QHS  . nicotine  14 mg Transdermal Q24H  . pantoprazole  40 mg Oral Q1200  . pneumococcal 23 valent vaccine  0.5 mL Intramuscular Tomorrow-1000  . general admission iv infusion   Intravenous Once  . DISCONTD: albuterol  2.5 mg Nebulization Q6H  . DISCONTD: ipratropium  0.5 mg Nebulization Q6H  . DISCONTD: levofloxacin (LEVAQUIN) IV  750 mg Intravenous Q24H   Continuous Infusions:   . sodium chloride 100 mL/hr at 12/28/11 0744  . DISCONTD: dextrose 5 % and 0.45% NaCl     PRN Meds:.acetaminophen, acetaminophen, albuterol, HYDROcodone-acetaminophen, morphine Assessment/Plan:  COPD with acute on chronic respiratory failure - Pt does seem to be somewhat improved today. Will go evaluate her once more in the afternoon and if she is stable will plan to move her to regular bed. Will continue IV antibiotics, steroids, muccinex with scheduled nebulizers. Have started advair on her and if she can tolerate it this may be a good thing to keep in out-patient. She is currently using 4 L oxygen when baseline is 2 L.  TOBACCO USE - She does continue to smoke and does not quit at this time but did advise her to quit strongly.    Hypertension - BP is well controlled right now and are currently holding beta-blocker. Will adjust her regimen while she is here if appropriate.   History of lung cancer with history of radiation therapy - Per CT scan chest on admission this is in remission and nodule is stable and lymph nodes are smaller.   DVT ppx - Lovenox  Disposition - Possible move to the floor today and will continue to watch closely. Will need continued care to improve her  respiratory status. Will order ensure for her and PT/OT for evaluation of her functional status.    LOS: 2 days   Genella Mech 12/28/2011, 11:07 AM

## 2011-12-28 NOTE — H&P (Signed)
Please see Dr Rowe Robert H&P for full details.   Ms Selley has had increasing  DOE for the past 4 days. She is unable to walk more than a few steps without dyspnea. She has a cough and congestion and uses an OTC cough med to try to control the sxs and help her sleeps but it only works one hour or so. She uses both her MDI's and ner nebs but is unable to state what makes her use one over the other. On a good days she uses them QID but has had to increase her usage. She doesn't use her Advair diskus as she can't feel it working. She uses O2 at 2 L all day every day and hasn't increased the usage. She c/o insomnia (treats with OTC alcoholic cough med), lack of appetite but doesn't believe she has lost weight. She cont to smoke but is cutting down. Can't remember the last time she was hospitalized for her breathing but states she always gets sick in March 2/2 wind and cold weather. Also C/O dysphagia with foods for years. Had EGD for same issue and told nl, cannot find report in EPI.  ER course : O2 sat initially in the 80'2. Pt didn't;t tolerate BiPap. O2 sat improveded to 90% on 5L and / or NRB.  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 She has severe COPD, PFT's unknown, and is on 24 hour O2  HTN  On exam, she is using accessory muscles to breath. She has decent air movement, L > R, some coarse BS at bases B. HRRR ABD soft and NT EXT no edema Neuro no focal Vitals were reviewed  Labs and investigations were reviewed.  A/P 1. Acute resp failure - likely 2/2 COPD exac. She is responding to steroids, ABX, and nebs. O2 requirements have lessened. CT R/O a PE as cause. No PNA but is being covered with ABX for COPD exac. Acute radiation pneumonitis unlikely as she is too far out from XRT. But is time frame for fibrotic radiation pneumonitis. Ut CT didn't suggest this as etiology. Treatment anyway is steroids so she is being treated.  2. H/O NSC lung carcinoma  - will need to see onc as an outpt  3. Dysphagia - I could not locate EGD results but would be interesting to see results. What she is describing is esophageal dysphagia and W/U is EGD. Try to find EGD report and outpt GI referral.  4. Weight loss and insomnia - will try Remeron as it can treat both.  5 COPD - She prefers MDI's so will change Advair to MDI if medicaid will cover. Will ask pt if I can refer to [facility] for COPD mgmt.

## 2011-12-28 NOTE — Progress Notes (Signed)
OT Note:  Attempted x 2 to see pt.  First visit pt was eating lunch and upon second she had just returned to bed and was sleeping.  Will continue to follow. 12/28/2011 Martie Round, OTR/L Pager: [phone]

## 2011-12-29 LAB — CBC
HCT: 36 % (ref 36.0–46.0)
Hemoglobin: 11.5 g/dL — ABNORMAL LOW (ref 12.0–15.0)
MCHC: 31.9 g/dL (ref 30.0–36.0)
MCV: 94.7 fL (ref 78.0–100.0)
RBC: 3.8 MIL/uL — ABNORMAL LOW (ref 3.87–5.11)
RDW: 15 % (ref 11.5–15.5)
WBC: 9.1 10*3/uL (ref 4.0–10.5)

## 2011-12-29 MED ORDER — BIOTENE DRY MOUTH MT LIQD
15.0000 mL | Freq: Two times a day (BID) | OROMUCOSAL | Status: DC
  Administered 2011-12-29 – 2011-12-30 (×2): 15 mL via OROMUCOSAL

## 2011-12-29 MED ORDER — CHLORHEXIDINE GLUCONATE 0.12 % MT SOLN
15.0000 mL | Freq: Two times a day (BID) | OROMUCOSAL | Status: DC
  Administered 2011-12-29 – 2011-12-30 (×4): 15 mL via OROMUCOSAL
  Filled 2011-12-29 (×5): qty 15

## 2011-12-29 MED ORDER — MIRTAZAPINE 15 MG PO TABS
15.0000 mg | ORAL_TABLET | Freq: Every day | ORAL | Status: DC
Start: 2011-12-29 — End: 2011-12-30
  Administered 2011-12-29: 15 mg via ORAL
  Filled 2011-12-29 (×2): qty 1

## 2011-12-29 NOTE — Progress Notes (Signed)
Called Report to [facility] on 4500, Will have NT transfer patient.

## 2011-12-29 NOTE — Progress Notes (Signed)
Resident Co-sign Daily Note: I have seen the patient and reviewed the daily progress note by Lewie Chamber MS 3 and discussed the care of the patient with them.  See below for documentation of my findings, assessment, and plans.  Subjective: The patient is sitting up in bed and has been eating her food and drinking the ensures. She stills gets a little bit out of breath when walking to the bathroom. Still coughing a little bit and breathing is okay this morning. She is not having any pain currently and denied chest pain, abdominal pain, nausea, vomiting, diarrhea.  Objective: Vital signs in last 24 hours: Filed Vitals:   12/28/11 2019 12/28/11 2340 12/29/11 0345 12/29/11 0745  BP: 121/90 149/69 186/86   Pulse: 93 79 90   Temp: 98 F (36.7 C) 98.2 F (36.8 C) 98.1 F (36.7 C)   TempSrc: Oral Oral Oral   Resp: 15 22 20    Height:      Weight:      SpO2: 94% 96% 84% 97%   Physical Exam: General: resting in bed, cheerful,  HEENT: PERRL, EOMI, no scleral icterus  Cardiac: RRR, no rubs, murmurs or gallops  Pulm: moving better air today than yesterday some coarse sounds in the bases  Abd: soft, nontender, nondistended, BS present  Ext: warm and well perfused, no pedal edema  Neuro: alert and oriented X3, cranial nerves II-XII grossly intact  Lab Results: Reviewed and documented in Electronic Record Micro Results: Reviewed and documented in Electronic Record Studies/Results: Reviewed and documented in Electronic Record Medications: I have reviewed the patient's current medications. Scheduled Meds:   . albuterol  2.5 mg Nebulization TID  . amLODipine  10 mg Oral Daily  . enoxaparin  40 mg Subcutaneous Q24H  . feeding supplement  237 mL Oral BID BM  . Fluticasone-Salmeterol  1 puff Inhalation BID  . guaiFENesin  600 mg Oral BID  . influenza  inactive virus vaccine  0.5 mL Intramuscular Once  . ipratropium  0.5 mg Nebulization TID  . levofloxacin (LEVAQUIN) IV  750 mg Intravenous  Q24H  . methylPREDNISolone (SOLU-MEDROL) injection  125 mg Intravenous Q6H  . mirtazapine  30 mg Oral QHS  . nicotine  14 mg Transdermal Q24H  . pantoprazole  40 mg Oral Q1200  . pneumococcal 23 valent vaccine  0.5 mL Intramuscular Tomorrow-1000  . DISCONTD: albuterol  2.5 mg Nebulization Q6H  . DISCONTD: ipratropium  0.5 mg Nebulization Q6H   Continuous Infusions:   . sodium chloride 100 mL/hr at 12/29/11 0200   PRN Meds:.acetaminophen, acetaminophen, albuterol, HYDROcodone-acetaminophen, morphine Assessment/Plan:  COPD with acute on chronic respiratory failure - Pt does seem to be somewhat improved today. Will move her to regular bed and D/C telemetry. Will consider changing antibiotics, steroids to PO, continue muccinex with prn nebulizers. Have started advair on her and if she can tolerate it this may be a good thing to keep in out-patient. She is currently using 3 L oxygen when baseline is 2 L.   TOBACCO USE - She does continue to smoke and does not quit at this time but did advise her to quit strongly.   Hypertension - BP is slightly elevated and will restart her HCTZ from home dosing. Will continue norvasc as well.   History of lung cancer with history of radiation therapy - Per CT scan chest on admission this is in remission and nodule is stable and lymph nodes are smaller.   DVT ppx - Lovenox   Disposition -  Will move to regular bed and monitor for today. Will change steroids to PO today. Will change antibiotics to PO today as well. Will add HCTZ back for BP control and watch respiratory status.     LOS: 3 days   Genella Mech 12/29/2011, 8:09 AM

## 2011-12-29 NOTE — Evaluation (Signed)
Physical Therapy Evaluation Patient Details Name: ELAYJAH CHANEY MRN: [id number] DOB: 1955-12-30 Today's Date: 12/29/2011  Problem List:  Patient Active Problem List  Diagnoses  . ADENOCARCINOMA, RIGHT LUNG  . ALCOHOLISM  . TOBACCO USE  . VISUAL IMPAIRMENT  . ESSENTIAL HYPERTENSION  . COPD  . HYPERGLYCEMIA, BORDERLINE  . Humerus fracture, Left  . COPD (chronic obstructive pulmonary disease)  . Hypertension  . Lung cancer  . History of radiation therapy  . Malignant neoplasm of upper lobe, bronchus or lung  . Respiratory failure    Past Medical History:  Past Medical History  Diagnosis Date  . Tobacco abuse   . COPD (chronic obstructive pulmonary disease)     exacerbagtion in 6/12 and in 2010.   Marland Kitchen Hypertension   . History of radiation therapy 07/19/10 to 07/28/10    RUL lung  . 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 Alcohol abuse     2 beers a day   Past Surgical History:  Past Surgical History  Procedure Date  . No past surgeries     PT Assessment/Plan/Recommendation PT Assessment Clinical Impression Statement: Patient presents with COPD exacerbation with acute on chronic respiratory failure. She will require PTin the acute care setting only to ensure that she improves her functional activity tolerance and is mod. indep. with cane use. I would recommend a cane for use at home to incr. community gait. She also needs portable oxygen carrier to enable her to ambulate without use of heavy tanks that are cumbersome and leading her less likely to participate in exercise at discharge. PT Recommendation/Assessment: Patient will need skilled PT in the acute care venue PT Problem List: Cardiopulmonary status limiting activity;Decreased mobility;Decreased knowledge of use of DME;Decreased activity tolerance PT Therapy Diagnosis : Difficulty walking PT Plan PT Frequency: Min 3X/week PT Treatment/Interventions: DME instruction;Stair training;Functional mobility  training;Balance training;Patient/family education PT Recommendation Follow Up Recommendations: No PT follow up Equipment Recommended: Cane (Straight cane). Potable O2 carrier - over the shoulder PT Goals  Acute Rehab PT Goals PT Goal Formulation: With patient Time For Goal Achievement: 7 days Pt will Ambulate: >150 feet;with modified independence;with cane (with Sao2 equal to or greater than 88 at all times.) PT Goal: Ambulate - Progress: Goal set today Additional Goals Additional Goal #1: Normal timed up and go test to demonstrate decreased risk of fall and improved functional efficiency.  PT Goal: Additional Goal #1 - Progress: Goal set today  PT Evaluation Precautions/Restrictions  Precautions Precautions: Fall Prior Functioning  Home Living Lives With:  ("other grandmother") Receives Help From: Family;Friend(s) Type of Home: Apartment Home Layout: One level Home Access: Level entry Bathroom Shower/Tub: Engineer, manufacturing systems: Standard Home Adaptive Equipment:  (oxygen concentrator and tanks) Prior Function Level of Independence: Independent with basic ADLs;Independent with gait;Independent with transfers;Needs assistance with homemaking Driving: No Cognition Cognition Arousal/Alertness: Awake/alert Overall Cognitive Status: Appears within functional limits for tasks assessed Orientation Level: Oriented X4 Sensation/Coordination Sensation Light Touch: Appears Intact Proprioception: Appears Intact Coordination Gross Motor Movements are Fluid and Coordinated: Yes Fine Motor Movements are Fluid and Coordinated: Yes Extremity Assessment RLE Assessment RLE Assessment: Within Functional Limits LLE Assessment LLE Assessment: Within Functional Limits Mobility (including Balance) Bed Mobility Bed Mobility: Yes Supine to Sit: 7: Independent Sitting - Scoot to Edge of Bed: 7: Independent Sit to Supine: 7: Independent Transfers Transfers: Yes Sit to Stand: 7:  Independent Stand to Sit: 7: Independent Ambulation/Gait Ambulation/Gait: Yes Ambulation/Gait Assistance: 5: Supervision Ambulation/Gait  Assistance Details (indicate cue type and reason): Patient expresses desire to try cane secondary to fatigue with gait that causes her to veer from path when walking in the community. Brief education on crrect use of device which patient immediately deomnstrated correctly. SaO2 decr. to 84 at last 20 feet of gait - educated in positioning for breathlessness and breathing control. Increased SaO2 to 92 within 30 seconds of rest. No imbalance with gait noted Ambulation Distance (Feet): 300 Feet Assistive device: Straight cane Gait Pattern: Within Functional Limits  Posture/Postural Control Posture/Postural Control: No significant limitations Balance Balance Assessed: Yes Static Sitting Balance Static Sitting - Balance Support: No upper extremity supported;Feet unsupported Static Sitting - Level of Assistance: 7: Independent Dynamic Sitting Balance Dynamic Sitting - Balance Support: No upper extremity supported;Feet unsupported Dynamic Sitting - Level of Assistance: 7: Independent Dynamic Sitting - Balance Activities: Reaching across midline;Reaching for objects;Lateral lean/weight shifting Static Standing Balance Static Standing - Balance Support: No upper extremity supported Static Standing - Level of Assistance: 7: Independent   End of Session PT - End of Session Equipment Utilized During Treatment: Gait belt Activity Tolerance: Patient tolerated treatment well (likely close to baseline) Patient left: in bed;with call bell in reach Nurse Communication: Mobility status for ambulation General Behavior During Session: [facility] for tasks performed Cognition: [facility] for tasks performed  Edwyna Perfect, PT  Pager [phone]  12/29/2011, 9:47 AM

## 2011-12-29 NOTE — Evaluation (Signed)
Occupational Therapy Evaluation Patient Details Name: Karina Weber MRN: [id number] DOB: [birth year] Today's Date: 12/29/2011  Problem List:  Patient Active Problem List  Diagnoses  . ADENOCARCINOMA, RIGHT LUNG  . ALCOHOLISM  . TOBACCO USE  . VISUAL IMPAIRMENT  . ESSENTIAL HYPERTENSION  . COPD  . HYPERGLYCEMIA, BORDERLINE  . Humerus fracture, Left  . COPD (chronic obstructive pulmonary disease)  . Hypertension  . Lung cancer  . History of radiation therapy  . Malignant neoplasm of upper lobe, bronchus or lung  . Respiratory failure    Past Medical History:  Past Medical History  Diagnosis Date  . Tobacco abuse   . COPD (chronic obstructive pulmonary disease)     exacerbagtion in 6/12 and in 2010.   Marland Kitchen Hypertension   . History of radiation therapy 07/19/10 to 07/28/10    RUL lung  . 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 Alcohol abuse     2 beers a day   Past Surgical History:  Past Surgical History  Procedure Date  . No past surgeries     OT Assessment/Plan/Recommendation OT Assessment Clinical Impression Statement: Pt presents to OT with deceased I with ADL activity due to fatigue.  Pt will benefit from OT to increase I with ADL activity and return to PLOF OT Recommendation/Assessment: Patient will need skilled OT in the acute care venue OT Problem List: Decreased strength;Decreased activity tolerance;Cardiopulmonary status limiting activity OT Therapy Diagnosis : Generalized weakness OT Plan OT Frequency: Min 2X/week OT Treatment/Interventions: Self-care/ADL training;Energy conservation;Patient/family education OT Recommendation Follow Up Recommendations: Home health OT Equipment Recommended: Tub/shower seat Individuals Consulted Consulted and Agree with Results and Recommendations: Patient OT Goals Acute Rehab OT Goals OT Goal Formulation: With patient Miscellaneous OT Goals Miscellaneous OT Goal #1: Pt will state and implement energy  conservation strategies during ADL task with handout to refer too as needed at mod I level OT Goal: Miscellaneous Goal #1 - Progress: Goal set today  OT Evaluation Precautions/Restrictions  Precautions Precautions: Fall Prior Functioning Home Living Lives With:  ("other grandmother") Receives Help From: Family;Friend(s) Type of Home: Apartment Home Layout: One level Home Access: Level entry Bathroom Shower/Tub: Engineer, manufacturing systems: Standard Home Adaptive Equipment:  (oxygen concentrator and tanks) Additional Comments: needs a tub seat Prior Function Level of Independence: Independent with basic ADLs;Independent with gait;Independent with transfers;Needs assistance with homemaking Driving: No ADL ADL Grooming: Simulated;Set up Where Assessed - Grooming: Sitting, bed;Unsupported Upper Body Bathing: Simulated;Set up Where Assessed - Upper Body Bathing: Sitting, bed;Unsupported Lower Body Bathing: Simulated Where Assessed - Lower Body Bathing: Sitting, bed;Unsupported;Sit to stand from bed Upper Body Dressing: Simulated;Set up Where Assessed - Upper Body Dressing: Sitting, bed;Unsupported Lower Body Dressing: Performed;Set up Where Assessed - Lower Body Dressing: Sit to stand from bed Toilet Transfer: Simulated;Minimal assistance Toilet Transfer Details (indicate cue type and reason): bed to chair transfer. Pt anxious to get OOB and into chair Toilet Transfer Method: Stand pivot Toileting - Clothing Manipulation: Simulated;Supervision/safety Where Assessed - Toileting Clothing Manipulation: Standing Toileting - Hygiene: Simulated;Set up Where Assessed - Toileting Hygiene: Standing Tub/Shower Transfer: Not assessed Tub/Shower Transfer Method: Not assessed Ambulation Related to ADLs: Pt fatigues quickly and will benefit from further education regarding energy conservation. Feel pt will benefit from [facility] to address energy conservation in her home      Cognition Cognition Arousal/Alertness: Awake/alert Overall Cognitive Status: Appears within functional limits for tasks assessed Orientation Level: Oriented X4 Sensation/Coordination Sensation Light Touch: Appears Intact  Proprioception: Appears Intact Coordination Gross Motor Movements are Fluid and Coordinated: Yes Fine Motor Movements are Fluid and Coordinated: Yes Extremity Assessment RUE Assessment RUE Assessment: Within Functional Limits LUE Assessment LUE Assessment: Within Functional Limits Mobility  Bed Mobility Bed Mobility: Yes Supine to Sit: 7: Independent Sitting - Scoot to Edge of Bed: 7: Independent Sit to Supine: 7: Independent Transfers Sit to Stand: 5: Supervision;With upper extremity assist Stand to Sit: 5: Supervision;With upper extremity assist    End of Session OT - End of Session Activity Tolerance: Patient limited by fatigue Patient left: in chair;with call bell in reach General Behavior During Session: [facility] for tasks performed Cognition: [facility] for tasks performed   [place], [place]ung 12/29/2011, 12:54 PM

## 2011-12-29 NOTE — Progress Notes (Signed)
Medical Student Daily Progress Note  Subjective: Patient is breathing much better today than yesterday. She was able to eat a little more food and drink some ensures since yesterday. She has some more energy since yesterday as well and is in a pleasant mood. She does still report a tender abdomen, but appears to be in no apparent distress. She is still coughing this morning but overall her symptoms have seemed to improve. She also slept much better last night thanks to her "sleeping pill" and would also like to take it at home too to help with sleeping. Objective: Vital signs in last 24 hours: Filed Vitals:   12/28/11 2019 12/28/11 2340 12/29/11 0345 12/29/11 0745  BP: 121/90 149/69 186/86   Pulse: 93 79 90   Temp: 98 F (36.7 C) 98.2 F (36.8 C) 98.1 F (36.7 C)   TempSrc: Oral Oral Oral   Resp: 15 22 20    Height:      Weight:      SpO2: 94% 96% 84% 97%     Intake/Output Summary (Last 24 hours) at 12/29/11 0757 Last data filed at 12/29/11 0600  Gross per 24 hour  Intake   2550 ml  Output      1 ml  Net   2549 ml   Physical Exam: General: pleasant and resting in bed HEENT: PERRL, EOMI, no scleral icterus Cardiac: RRR, no rubs, murmurs or gallops Pulm: lungs sound clearer than yesterday Abd: soft, nontender, nondistended, BS present Extremities: warm, well perfused, no edema Neuro: A&O x 3, cranial nerves II-XII grossly intact Skin: dry skin, no visible rashes    Lab Results: Basic Metabolic Panel:  Lab 12/28/11 1610 12/27/11 1550 12/26/11 2209  NA 135 -- 134*  K 4.0 -- 4.6  CL 94* -- 93*  CO2 31 -- 30  GLUCOSE 238* -- 121*  BUN 17 -- 16  CREATININE 0.40* 0.66 --  CALCIUM 8.9 -- 9.8  MG -- -- --  PHOS -- -- --   Liver Function Tests:  Lab 12/28/11 0630  AST 12  ALT 9  ALKPHOS 111  BILITOT 0.1*  PROT 6.6  ALBUMIN 2.9*   No results found for this basename: LIPASE:2,AMYLASE:2 in the last 168 hours No results found for this basename: AMMONIA:2 in the last  168 hours CBC:  Lab 12/29/11 0450 12/28/11 0630 12/26/11 2209  WBC 9.1 5.8 --  NEUTROABS -- -- 5.8  HGB 11.5* 10.7* --  HCT 36.0 33.4* --  MCV 94.7 96.3 --  PLT 288 248 --   Cardiac Enzymes:  Lab 12/28/11 0630 12/27/11 2247 12/27/11 1550  CKTOTAL 49 61 59  CKMB 3.8 4.2* 4.9*  CKMBINDEX -- -- --  TROPONINI <0.30 <0.30 <0.30   BNP:  Lab 12/27/11 1550  PROBNP 447.7*   D-Dimer: No results found for this basename: DDIMER:2 in the last 168 hours CBG: No results found for this basename: GLUCAP:6 in the last 168 hours Hemoglobin A1C: No results found for this basename: HGBA1C in the last 168 hours Fasting Lipid Panel: No results found for this basename: CHOL,HDL,LDLCALC,TRIG,CHOLHDL,LDLDIRECT in the last 960 hours Thyroid Function Tests:  Lab 12/27/11 1550  TSH 0.604  T4TOTAL --  FREET4 --  T3FREE --  THYROIDAB --   Coagulation: No results found for this basename: LABPROT:4,INR:4 in the last 168 hours Anemia Panel: No results found for this basename: VITAMINB12,FOLATE,FERRITIN,TIBC,IRON,RETICCTPCT in the last 168 hours Urine Drug Screen: Drugs of Abuse     Component Value Date/Time  LABOPIA NEGATIVE 03/10/2011 0520   LABOPIA NONE DETECTED 11/27/2009 0514   COCAINSCRNUR NEGATIVE 03/10/2011 0520   COCAINSCRNUR NONE DETECTED 11/27/2009 0514   LABBENZ NEGATIVE 03/10/2011 0520   LABBENZ NONE DETECTED 11/27/2009 0514   AMPHETMU NEGATIVE 03/10/2011 0520   AMPHETMU NONE DETECTED 11/27/2009 0514   THCU NONE DETECTED 11/27/2009 0514   LABBARB  Value: NONE DETECTED        DRUG SCREEN FOR MEDICAL PURPOSES ONLY.  IF CONFIRMATION IS NEEDED FOR ANY PURPOSE, NOTIFY LAB WITHIN 5 DAYS.        LOWEST DETECTABLE LIMITS FOR URINE DRUG SCREEN Drug Class       Cutoff (ng/mL) Amphetamine      1000 Barbiturate      200 Benzodiazepine   200 Tricyclics       300 Opiates          300 Cocaine          300 THC              50 11/27/2009 0514    Alcohol Level: No results found for this basename: ETH:2 in  the last 168 hours Urinalysis: No results found for this basename: COLORURINE:2,APPERANCEUR:2,LABSPEC:2,PHURINE:2,GLUCOSEU:2,HGBUR:2,BILIRUBINUR:2,KETONESUR:2,PROTEINUR:2,UROBILINOGEN:2,NITRITE:2,LEUKOCYTESUR:2 in the last 168 hours   Micro Results: No results found for this or any previous visit (from the past 240 hour(s)). Studies/Results: No results found. Medications:  I have reviewed the patient's current medications. Scheduled:   . albuterol  2.5 mg Nebulization TID  . amLODipine  10 mg Oral Daily  . enoxaparin  40 mg Subcutaneous Q24H  . feeding supplement  237 mL Oral BID BM  . Fluticasone-Salmeterol  1 puff Inhalation BID  . guaiFENesin  600 mg Oral BID  . influenza  inactive virus vaccine  0.5 mL Intramuscular Once  . ipratropium  0.5 mg Nebulization TID  . levofloxacin (LEVAQUIN) IV  750 mg Intravenous Q24H  . methylPREDNISolone (SOLU-MEDROL) injection  125 mg Intravenous Q6H  . mirtazapine  30 mg Oral QHS  . nicotine  14 mg Transdermal Q24H  . pantoprazole  40 mg Oral Q1200  . pneumococcal 23 valent vaccine  0.5 mL Intramuscular Tomorrow-1000  . DISCONTD: albuterol  2.5 mg Nebulization Q6H  . DISCONTD: ipratropium  0.5 mg Nebulization Q6H   Continuous:   . sodium chloride 100 mL/hr at 12/29/11 0200   RUE:[id number], acetaminophen, albuterol, HYDROcodone-acetaminophen, morphine Scheduled Meds:   . albuterol  2.5 mg Nebulization TID  . amLODipine  10 mg Oral Daily  . enoxaparin  40 mg Subcutaneous Q24H  . feeding supplement  237 mL Oral BID BM  . Fluticasone-Salmeterol  1 puff Inhalation BID  . guaiFENesin  600 mg Oral BID  . influenza  inactive virus vaccine  0.5 mL Intramuscular Once  . ipratropium  0.5 mg Nebulization TID  . levofloxacin (LEVAQUIN) IV  750 mg Intravenous Q24H  . methylPREDNISolone (SOLU-MEDROL) injection  125 mg Intravenous Q6H  . mirtazapine  30 mg Oral QHS  . nicotine  14 mg Transdermal Q24H  . pantoprazole  40 mg Oral Q1200  .  pneumococcal 23 valent vaccine  0.5 mL Intramuscular Tomorrow-1000  . DISCONTD: albuterol  2.5 mg Nebulization Q6H  . DISCONTD: ipratropium  0.5 mg Nebulization Q6H   Continuous Infusions:   . sodium chloride 100 mL/hr at 12/29/11 0200   PRN Meds:.acetaminophen, acetaminophen, albuterol, HYDROcodone-acetaminophen, morphine Assessment/Plan:  COPD with acute on chronic respiratory failure - Patient has improved further since yesterday. She is moving more air in her lungs and  they sound much clearer. Will transition her IV abx to PO today. Her Advair helped yesterday with breathing easier. She is currently breathing 3L oxygen with sats 97-99%. Mirtazapine helped tremendously with her being able to get restful sleep last night.  Tobacco Use - Patient stated this morning she has patches at home and is going to try using them to cut back on smoking s/p discharge. She is aware that help will be provided to her if she desires to fully quit.  Hypertension - controlled; one episode of increased pressures overnight, will continue to monitor, but otherwise stable for now  History of lung cancer with history of radiation therapy - adenocarcinoma is currently in remission and a stable nodule remains and smaller lymph nodes  DVT ppx - Lovenox  Disposition - Patient is stable enough for transfer today. Will monitor her antibiotics transition to PO today and PT/OT will get her up and out of bed to be more mobile today.    LOS: 3 days   This is a Psychologist, occupational Note.  The care of the patient was discussed with Dr. Genella Mech and the assessment and plan formulated with their assistance.  Please see their attached note for official documentation of the daily encounter.  Lewie Chamber 12/29/2011, 7:57 AM

## 2011-12-30 MED ORDER — MIRTAZAPINE 15 MG PO TABS: 15.0000 mg | ORAL_TABLET | Freq: Every day | ORAL | Status: DC

## 2011-12-30 MED ORDER — FLUTICASONE-SALMETEROL 250-50 MCG/DOSE IN AEPB: 1.0000 | INHALATION_SPRAY | Freq: Two times a day (BID) | RESPIRATORY_TRACT | Status: DC

## 2011-12-30 MED ORDER — ENSURE COMPLETE PO LIQD: 237.0000 mL | Freq: Two times a day (BID) | ORAL | Status: DC

## 2011-12-30 MED ORDER — ALBUTEROL SULFATE HFA 108 (90 BASE) MCG/ACT IN AERS: 1.0000 | INHALATION_SPRAY | RESPIRATORY_TRACT | Status: DC | PRN

## 2011-12-30 MED ORDER — LEVOFLOXACIN 750 MG PO TABS: 750.0000 mg | ORAL_TABLET | Freq: Every day | ORAL | Status: AC

## 2011-12-30 MED ORDER — IPRATROPIUM BROMIDE 0.02 % IN SOLN: 500.0000 ug | Freq: Four times a day (QID) | RESPIRATORY_TRACT | Status: DC

## 2011-12-30 MED ORDER — PREDNISONE 20 MG PO TABS: 20.0000 mg | ORAL_TABLET | Freq: Every day | ORAL | Status: AC

## 2011-12-30 MED ORDER — LEVOFLOXACIN 750 MG PO TABS
750.0000 mg | ORAL_TABLET | Freq: Every day | ORAL | Status: DC
  Administered 2011-12-30: 750 mg via ORAL
  Filled 2011-12-30: qty 1

## 2011-12-30 MED ORDER — ALBUTEROL SULFATE (2.5 MG/3ML) 0.083% IN NEBU: 2.5000 mg | INHALATION_SOLUTION | Freq: Four times a day (QID) | RESPIRATORY_TRACT | Status: DC | PRN

## 2011-12-30 MED ORDER — AMLODIPINE BESYLATE 10 MG PO TABS: 10.0000 mg | ORAL_TABLET | Freq: Every day | ORAL | Status: DC

## 2011-12-30 MED ORDER — HYDROCHLOROTHIAZIDE 12.5 MG PO CAPS: 12.5000 mg | ORAL_CAPSULE | Freq: Every day | ORAL | Status: DC

## 2011-12-30 MED ORDER — GUAIFENESIN ER 600 MG PO TB12: 600.0000 mg | ORAL_TABLET | Freq: Two times a day (BID) | ORAL | Status: DC

## 2011-12-30 MED ORDER — HYDROCHLOROTHIAZIDE 12.5 MG PO CAPS
12.5000 mg | ORAL_CAPSULE | Freq: Every day | ORAL | Status: DC
  Administered 2011-12-30: 12.5 mg via ORAL
  Filled 2011-12-30: qty 1

## 2011-12-30 MED ORDER — PREDNISONE 50 MG PO TABS
60.0000 mg | ORAL_TABLET | Freq: Every day | ORAL | Status: DC
  Administered 2011-12-30: 60 mg via ORAL
  Filled 2011-12-30 (×2): qty 1

## 2011-12-30 NOTE — Progress Notes (Signed)
Physical Therapy Treatment Patient Details Name: HALIA FRANEY MRN: [id number] DOB: Dec 13, 1955 Today's Date: 12/30/2011  PT Assessment/Plan  PT - Assessment/Plan Comments on Treatment Session: Pt doing well with amb with cane.  Pt okay for dc home from PT standpoint. PT Plan: Discharge plan remains appropriate Follow Up Recommendations: No PT follow up Equipment Recommended: Gilmer Mor PT Goals  Acute Rehab PT Goals PT Goal: Ambulate - Progress: Met  PT Treatment Precautions/Restrictions  Precautions Precautions: Fall Mobility (including Balance) Transfers Sit to Stand: 6: Modified independent (Device/Increase time);From bed;With upper extremity assist Stand to Sit: 6: Modified independent (Device/Increase time);To bed;With upper extremity assist Ambulation/Gait Ambulation/Gait Assistance: 6: Modified independent (Device/Increase time) Ambulation Distance (Feet): 350 Feet Assistive device: Straight cane Gait Pattern: Within Functional Limits  Static Standing Balance Static Standing - Balance Support: No upper extremity supported Static Standing - Level of Assistance: 7: Independent Exercise    End of Session PT - End of Session Activity Tolerance: Patient tolerated treatment well Patient left: in bed;with call bell in reach General Behavior During Session: [facility] for tasks performed Cognition: [facility] for tasks performed  [facility] 12/30/2011, 2:30 PM  [employer] PT [phone]

## 2011-12-30 NOTE — Progress Notes (Signed)
Occupational Therapy Treatment Patient Details Name: Karina Weber MRN: [id number] DOB: 1956/07/24 Today's Date: 12/30/2011  OT Assessment/Plan OT Assessment/Plan Comments on Treatment Session: Pt. eager for discharge.  Pt. continues to demonstrates impaired endurance.  She does verabalize understanding regarding energy conservation techniques.  Recommend tub seat for home use; however, pt. resistant at this point OT Plan: Discharge plan remains appropriate OT Frequency: Min 2X/week Follow Up Recommendations: Home health OT Equipment Recommended: Tub/shower seat OT Goals Miscellaneous OT Goals OT Goal: Miscellaneous Goal #1 - Progress: Progressing toward goals  OT Treatment Precautions/Restrictions  Precautions Precautions: Fall   ADL ADL Upper Body Bathing: Simulated;Supervision/safety Where Assessed - Upper Body Bathing: Standing in shower Lower Body Bathing: Simulated;Supervision/safety Lower Body Bathing Details (indicate cue type and reason): Pt. simulated showering in standing.  Pt. stood x 7 mins with supervision.  Pt. simulated UB and LB bathing.  Able to bathe feet by supporting self with UE.   Where Assessed - Lower Body Bathing: Standing in shower Ambulation Related to ADLs: Pt. ambulates with supervision.  Fatigues quickly ADL Comments: Pt. instructed in energy conservation techniques, and verbalizes how she incorporates these into daily activities.  Also discussed options for tub seats. Pt reports she doesn't feel she needs a tub seat and can continue to shower in standing.  She did demonstrate abilty to stand x 7 mins.and simulate a shower, however, continue to recommend tub seat for home use.  Pt. to receive HHOT who can continue to address this issue Mobility  Transfers Transfers: Yes Sit to Stand: 5: Supervision;With upper extremity assist Stand to Sit: 5: Supervision;With upper extremity assist Exercises    End of Session OT - End of Session Activity  Tolerance: Patient limited by fatigue Patient left: in chair;with call bell in reach (EOB) General Behavior During Session: [facility] for tasks performed Cognition: [facility] for tasks performed  Runette Scifres, Ursula Alert M  12/30/2011, 2:02 PM

## 2011-12-30 NOTE — Progress Notes (Signed)
Subjective: The patient is sitting up in bed and seems to be feeling back to her baseline as far as breathing. She is able to walk to the bathroom without difficulty. Did work with physical therapy yesterday and recommended cane which she does use at home.  Objective: Vital signs in last 24 hours: Filed Vitals:   12/29/11 2051 12/29/11 2137 12/30/11 0540 12/30/11 0745  BP:  141/75 158/86   Pulse:  103 87   Temp:  97 F (36.1 C) 97.4 F (36.3 C)   TempSrc:  Oral Oral   Resp:  20 20   Height:      Weight:      SpO2: 94% 87% 89% 90%   Weight change:   Intake/Output Summary (Last 24 hours) at 12/30/11 0925 Last data filed at 12/29/11 1102  Gross per 24 hour  Intake 543.33 ml  Output      0 ml  Net 543.33 ml   Physical Exam: General: resting in bed, sitting up, pleasant HEENT: PERRL, EOMI, no scleral icterus Cardiac: RRR, no rubs, murmurs or gallops Pulm: some coarse sounds, more air movement than yesterday, some expiratory wheeze Abd: soft, nontender, nondistended, BS present Ext: warm and well perfused, no pedal edema Neuro: alert and oriented X3, cranial nerves II-XII grossly intact  Lab Results: Basic Metabolic Panel:  Lab 12/28/11 0454 12/27/11 1550 12/26/11 2209  NA 135 -- 134*  K 4.0 -- 4.6  CL 94* -- 93*  CO2 31 -- 30  GLUCOSE 238* -- 121*  BUN 17 -- 16  CREATININE 0.40* 0.66 --  CALCIUM 8.9 -- 9.8  MG -- -- --  PHOS -- -- --   Liver Function Tests:  Lab 12/28/11 0630  AST 12  ALT 9  ALKPHOS 111  BILITOT 0.1*  PROT 6.6  ALBUMIN 2.9*   CBC:  Lab 12/29/11 0450 12/28/11 0630 12/26/11 2209  WBC 9.1 5.8 --  NEUTROABS -- -- 5.8  HGB 11.5* 10.7* --  HCT 36.0 33.4* --  MCV 94.7 96.3 --  PLT 288 248 --   Cardiac Enzymes:  Lab 12/28/11 0630 12/27/11 2247 12/27/11 1550  CKTOTAL 49 61 59  CKMB 3.8 4.2* 4.9*  CKMBINDEX -- -- --  TROPONINI <0.30 <0.30 <0.30   BNP:  Lab 12/27/11 1550  PROBNP 447.7*   Thyroid Function Tests:  Lab 12/27/11 1550    TSH 0.604  T4TOTAL --  FREET4 --  T3FREE --  THYROIDAB --   Urine Drug Screen: Drugs of Abuse     Component Value Date/Time   LABOPIA NEGATIVE 03/10/2011 0520   LABOPIA NONE DETECTED 11/27/2009 0514   COCAINSCRNUR NEGATIVE 03/10/2011 0520   COCAINSCRNUR NONE DETECTED 11/27/2009 0514   LABBENZ NEGATIVE 03/10/2011 0520   LABBENZ NONE DETECTED 11/27/2009 0514   AMPHETMU NEGATIVE 03/10/2011 0520   AMPHETMU NONE DETECTED 11/27/2009 0514   THCU NONE DETECTED 11/27/2009 0514   LABBARB  Value: NONE DETECTED        DRUG SCREEN FOR MEDICAL PURPOSES ONLY.  IF CONFIRMATION IS NEEDED FOR ANY PURPOSE, NOTIFY LAB WITHIN 5 DAYS.        LOWEST DETECTABLE LIMITS FOR URINE DRUG SCREEN Drug Class       Cutoff (ng/mL) Amphetamine      1000 Barbiturate      200 Benzodiazepine   200 Tricyclics       300 Opiates          300 Cocaine  300 THC              50 11/27/2009 0514   Medications: I have reviewed the patient's current medications. Scheduled Meds:   . albuterol  2.5 mg Nebulization TID  . amLODipine  10 mg Oral Daily  . antiseptic oral rinse  15 mL Mouth Rinse q12n4p  . chlorhexidine  15 mL Mouth Rinse BID  . enoxaparin  40 mg Subcutaneous Q24H  . feeding supplement  237 mL Oral BID BM  . Fluticasone-Salmeterol  1 puff Inhalation BID  . guaiFENesin  600 mg Oral BID  . hydrochlorothiazide  12.5 mg Oral Daily  . ipratropium  0.5 mg Nebulization TID  . levofloxacin  750 mg Oral Daily  . mirtazapine  15 mg Oral QHS  . nicotine  14 mg Transdermal Q24H  . pantoprazole  40 mg Oral Q1200  . predniSONE  60 mg Oral Q breakfast  . DISCONTD: levofloxacin (LEVAQUIN) IV  750 mg Intravenous Q24H  . DISCONTD: methylPREDNISolone (SOLU-MEDROL) injection  125 mg Intravenous Q6H  . DISCONTD: mirtazapine  30 mg Oral QHS   Continuous Infusions:   . DISCONTD: sodium chloride Stopped (12/29/11 1102)   PRN Meds:.acetaminophen, acetaminophen, albuterol, HYDROcodone-acetaminophen, morphine Assessment/Plan:  COPD  with acute on chronic respiratory failure - Pt does seem to be somewhat improved today. Did move to regular floor yesterday, was supposed to change antibiotics and steroids to PO however the orders were not released on transfer so she did receive IV antibiotics and steroids today. Have started advair on her and did encourage her to continue using it at home. She is currently using 1.5 L oxygen which is less than her home dose of oxygen.   TOBACCO USE - She does continue to smoke and does not quit at this time but did advise her to quit strongly.   Hypertension - BP is slightly elevated and will restart her HCTZ from home dosing which was also not released in the transfer. Will continue norvasc as well.   History of lung cancer with history of radiation therapy - Per CT scan chest on admission this is in remission and nodule is stable and lymph nodes are smaller.   DVT ppx - Lovenox   Disposition - Likely home today.    LOS: 4 days   Genella Mech 12/30/2011, 9:25 AM

## 2011-12-30 NOTE — Progress Notes (Addendum)
Noted order for HHOT, however HHOT is not covered by insurance unless pt requires a skilled need, MD will order [facility] for eval and assessment. Will order per pt request.  Johny Shock RN MPH Case Manger [phone] Met with pt and she agrees that a HHRN would be a good option for assessment and evaluation. Pt on O2 and has O2 at home however has neglected to have family bring an oxygen tank for her to use to go home. Pt states that she received oxygen from [facility] .  Have asked [facility] to provide oxygen tank for pt to go home, and straight cane ordered.  Johny Shock RN MPH Case Manager [phone]

## 2011-12-30 NOTE — Discharge Instructions (Signed)
You were seen for breathing problems. STOP SMOKING! We are giving you an antibiotic that you will need to take for 6 days, take it once a day. We are giving you steroids to help your breathing. Take 3 pills once a day for 3 days then take 2 pills once a day for 3 days then take 1 pill once a day for 3 days then stop. If you are having problems breathing or fevers please call our clinic or seek medical attention. Our clinic number is [phone].

## 2011-12-30 NOTE — Progress Notes (Signed)
   CARE MANAGEMENT NOTE 12/30/2011  Patient:  GLYNNIS, GAVEL   Account Number:  [id number]  Date Initiated:  12/29/2011  Documentation initiated by:  Ronny Flurry  Subjective/Objective Assessment:   DX:  Acute on chronic respiratory failure: from COPD exacerbation     Action/Plan:   Order for [facility] and cane   Anticipated DC Date:  12/30/2011   Anticipated DC Plan:  HOME W HOME HEALTH SERVICES         [facility] Choice  DURABLE MEDICAL EQUIPMENT  HOME HEALTH   Choice offered to / List presented to:  C-1 Patient   DME arranged  CANE      DME agency  [facility]     [facility] arranged  HH-1 RN      [facility] agency  [facility]   Status of service:  Completed, signed off Medicare Important Message given?   (If response is "NO", the following Medicare IM given date fields will be blank) Date Medicare IM given:   Date Additional Medicare IM given:    Discharge Disposition:  HOME W HOME HEALTH SERVICES  Per UR Regulation:  Reviewed for med. necessity/level of care/duration of stay  If discussed at Long Length of Stay Meetings, dates discussed:    Comments:  12/30/2011 Order for [facility], however not covered by insurance, have asked for [facility] to assess and eval in the home. Ordered cane for home use and asked [facility] to provide oxygen tank for this pt to d/c to home as pt uses home oxygen from [facility] and negelected to ask family to bring tank for her to use on the trip home. Johny Shock RN MPH case Manager [phone]

## 2011-12-30 NOTE — Discharge Summary (Signed)
Internal Medicine Teaching [facility] Discharge Note  Name: Karina Weber MRN: [id number] DOB: 09-19-1956 56 y.o.  Date of Admission: 12/26/2011 11:04 PM Date of Discharge: 12/30/2011 Attending Physician: Burns Spain, MD  Discharge Diagnosis: Active Problems:  TOBACCO USE  COPD  COPD (chronic obstructive pulmonary disease)  Hypertension  History of radiation therapy  Respiratory failure   Discharge Medications: Medication List  As of 12/30/2011  9:41 AM   STOP taking these medications         hydrochlorothiazide 25 MG tablet      metoprolol tartrate 25 MG tablet         TAKE these medications         albuterol (2.5 MG/3ML) 0.083% nebulizer solution   Commonly known as: PROVENTIL   Take 3 mLs (2.5 mg total) by nebulization every 6 (six) hours as needed for wheezing.      albuterol 108 (90 BASE) MCG/ACT inhaler   Commonly known as: PROVENTIL HFA;VENTOLIN HFA   Inhale 1 puff into the lungs every 4 (four) hours as needed for wheezing.      amLODipine 10 MG tablet   Commonly known as: NORVASC   Take 1 tablet (10 mg total) by mouth daily.      aspirin 81 MG tablet   Take 1 tablet (81 mg total) by mouth daily.      feeding supplement Liqd   Take 237 mLs by mouth 2 (two) times daily between meals.      Fluticasone-Salmeterol 250-50 MCG/DOSE Aepb   Commonly known as: ADVAIR   Inhale 1 puff into the lungs every 12 (twelve) hours.      guaiFENesin 600 MG 12 hr tablet   Commonly known as: MUCINEX   Take 1 tablet (600 mg total) by mouth 2 (two) times daily.      hydrochlorothiazide 12.5 MG capsule   Commonly known as: MICROZIDE   Take 1 capsule (12.5 mg total) by mouth daily.      ipratropium 0.02 % nebulizer solution   Commonly known as: ATROVENT   Take 2.5 mLs (500 mcg total) by nebulization 4 (four) times daily.      levofloxacin 750 MG tablet   Commonly known as: LEVAQUIN   Take 1 tablet (750 mg total) by mouth daily.      mirtazapine 15 MG  tablet   Commonly known as: REMERON   Take 1 tablet (15 mg total) by mouth at bedtime.      predniSONE 20 MG tablet   Commonly known as: DELTASONE   Take 1-3 tablets (20-60 mg total) by mouth daily with breakfast. Take 3 tablets by mouth once daily for 3 days, then take 2 tablet by mouth once daily for 3 days, then take 1 tablet by mouth for 3 days then stop.            Disposition and follow-up:   Karina Weber was discharged from [facility] in Stable condition.    Follow-up Appointments: Follow-up Information    Follow up with [facility]. (We will call you with an appointment.)    Contact information:   [address] [place] [address] [phone]        Discharge Orders    Future Appointments: Provider: Department: Dept Phone: Center:   01/20/2012 3:45 PM Leodis Sias, MD [facility] [phone] [facility]     Future Orders Please Complete By Expires   Diet - low sodium heart  healthy      Increase activity slowly      Discharge instructions      Comments:   Use 2 L of oxygen all the time.   Call MD for:  temperature >100.4      Call MD for:      Scheduling Instructions:   Difficulty breathing.      Consultations:    Procedures Performed:  Dg Chest 2 View  12/27/2011  *RADIOLOGY REPORT*  Clinical Data: Chest pain and shortness of breath.  History of lung cancer.  CHEST - 2 VIEW  Comparison: 03/14/2011  Findings: Upper limits normal heart size again noted. Streaky opacity within the medial right upper lobe is unchanged. COPD/emphysema again noted. There is no evidence of airspace disease, pleural effusion, new pulmonary opacity, or pneumothorax. No acute bony abnormalities are identified.  IMPRESSION: No evidence of acute cardiopulmonary disease.  COPD/emphysema.  Original Report Authenticated By: Rosendo Gros, M.D.   Ct Chest W Contrast  12/21/2011  *RADIOLOGY REPORT*  Clinical Data:  Follow-up lung carcinoma.   Right-sided chest pain and shortness of breath.  Vomiting.  Abdominal pain.  CT CHEST AND ABDOMEN WITH CONTRAST  Technique:  Multidetector CT imaging of the chest and abdomen was performed following the standard protocol during bolus administration of intravenous contrast.  Contrast: OMNIPAQUE IOHEXOL 300 MG/ML IJ SOLN  Comparison:  Chest CT on 06/10/2011, and abdomen CT on 05/12/2010  CT CHEST  Findings:  Pulmonary emphysema again demonstrated.  There has been further decrease in size of the pulmonary nodule in the posterior right upper lung, which now measures 1.0 x 1.6 cm compared to 1.3 x 1.7 cm on prior study.  Other smaller pulmonary nodular densities seen bilaterally remain stable.  No new or enlarging pulmonary nodules or masses are identified.  There is no evidence of acute infiltrate.  Mild mediastinal lymphadenopathy is again seen in the right paratracheal region.  Largest area measures 1.7 x 2.1 cm on image 26, which is mildly increased in size and previous study when this measured 1.2 x 1.5 cm.  No other areas of lymphadenopathy identified.  IMPRESSION:  1. Further slight decrease in size of posterior right upper lung nodule.  Other smaller bilateral pulmonary nodular densities remains stable. 2.  Slight increase in right paratracheal mediastinal lymphadenopathy.  No new lymphadenopathy identified.  CT ABDOMEN  Findings:  No liver masses are identified.  A small bilateral renal cysts are stable and there is no evidence of renal mass or hydronephrosis.  The pancreas, adrenal glands, and spleen are normal in appearance.  No other abdominal soft tissue masses or lymphadenopathy identified.  There is no evidence of inflammatory process or abnormal fluid collections.  Gallbladder is unremarkable.  No evidence of inflammatory process or abnormal fluid collections.  Visualized abdominal bowel loops appear normal.  Probable sebaceous cyst again noted in the right lower quadrant anterior abdominal wall  subcutaneous tissues.  No suspicious bone lesions identified.  IMPRESSION:  Stable exam.  No evidence of abdominal metastatic disease or other acute findings.  Original Report Authenticated By: Danae Orleans, M.D.   Ct Angio Chest W/cm &/or Wo Cm  12/27/2011  *RADIOLOGY REPORT*  Clinical Data: Mid chest pain and shortness of breath; cough. History of lung cancer.  CT ANGIOGRAPHY CHEST  Technique:  Multidetector CT imaging of the chest using the standard protocol during bolus administration of intravenous contrast. Multiplanar reconstructed images including MIPs were obtained and reviewed to evaluate the vascular anatomy.  Contrast: OMNIPAQUE IOHEXOL 350 MG/ML IV SOLN  Comparison: Chest radiograph performed 12/26/2011, and CT of the chest performed 12/21/2011  Findings: There is no evidence of pulmonary embolus.  Mild emphysema is again noted, particularly at the upper lung lobes.  The right upper lobe posterior pulmonary nodule is stable in size, measuring approximately 1.6 x 1.0 cm.  Scattered additional smaller nodular densities within both lungs are grossly unchanged in appearance.  No new airspace consolidation is seen; there is some scarring near the right upper lobe nodule.  There is no evidence of pleural effusion or pneumothorax.  No abnormal focal contrast enhancement is seen.  The enlarged precarinal node is mildly decreased in size, measuring 1.4 cm in short axis.  No additional mediastinal lymphadenopathy is seen.  No pericardial effusion is identified.  Diffuse coronary artery calcifications are noted.  The great vessels are grossly unremarkable in appearance.  No axillary lymphadenopathy is seen. The visualized portions of the thyroid gland are unremarkable in appearance.  The visualized portions of the liver and spleen are unremarkable. The visualized portions of the pancreas, stomach, adrenal glands and kidneys are within normal limits.  There is prominent calcification along the proximal  abdominal aorta, and along the proximal renal arteries and superior mesenteric artery.  No acute osseous abnormalities are seen.  IMPRESSION:  1.  No evidence of pulmonary embolus. 2.  Mild emphysema noted, particularly at the upper lung lobes. 3.  Stable appearance to 1.6 cm right upper lobe posterior pulmonary nodule; scattered smaller nodular densities within both lungs are grossly unchanged in appearance. 4.  Enlarged precarinal node measures 1.4 cm in short axis, mildly decreased from the prior study. 5.  Diffuse coronary artery calcifications seen. 6.  Prominent calcification along the proximal abdominal aorta, and along the proximal renal arteries and superior mesenteric artery.  Original Report Authenticated By: Tonia Ghent, M.D.   Ct Abdomen W Contrast  12/21/2011  *RADIOLOGY REPORT*  Clinical Data:  Follow-up lung carcinoma.  Right-sided chest pain and shortness of breath.  Vomiting.  Abdominal pain.  CT CHEST AND ABDOMEN WITH CONTRAST  Technique:  Multidetector CT imaging of the chest and abdomen was performed following the standard protocol during bolus administration of intravenous contrast.  Contrast: OMNIPAQUE IOHEXOL 300 MG/ML IJ SOLN  Comparison:  Chest CT on 06/10/2011, and abdomen CT on 05/12/2010  CT CHEST  Findings:  Pulmonary emphysema again demonstrated.  There has been further decrease in size of the pulmonary nodule in the posterior right upper lung, which now measures 1.0 x 1.6 cm compared to 1.3 x 1.7 cm on prior study.  Other smaller pulmonary nodular densities seen bilaterally remain stable.  No new or enlarging pulmonary nodules or masses are identified.  There is no evidence of acute infiltrate.  Mild mediastinal lymphadenopathy is again seen in the right paratracheal region.  Largest area measures 1.7 x 2.1 cm on image 26, which is mildly increased in size and previous study when this measured 1.2 x 1.5 cm.  No other areas of lymphadenopathy identified.  IMPRESSION:  1.  Further slight decrease in size of posterior right upper lung nodule.  Other smaller bilateral pulmonary nodular densities remains stable. 2.  Slight increase in right paratracheal mediastinal lymphadenopathy.  No new lymphadenopathy identified.  CT ABDOMEN  Findings:  No liver masses are identified.  A small bilateral renal cysts are stable and there is no evidence of renal mass or hydronephrosis.  The pancreas, adrenal glands, and spleen are normal  in appearance.  No other abdominal soft tissue masses or lymphadenopathy identified.  There is no evidence of inflammatory process or abnormal fluid collections.  Gallbladder is unremarkable.  No evidence of inflammatory process or abnormal fluid collections.  Visualized abdominal bowel loops appear normal.  Probable sebaceous cyst again noted in the right lower quadrant anterior abdominal wall subcutaneous tissues.  No suspicious bone lesions identified.  IMPRESSION:  Stable exam.  No evidence of abdominal metastatic disease or other acute findings.  Original Report Authenticated By: Danae Orleans, M.D.   Admission HPI:  Patient is a 56 y.o. 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 Patient has severe COPD at baseline. She is unable to walk 10-15 steps without getting short of breath. She uses albuterol multiple times during the day on her good days. She uses albuterol nebulizer constantly when she is working around the house. She uses 2 L oxygen by nasal cannula at bedtime. She does not use any other inhaler like Advair or Spiriva. Since past 3-4 days, her albuterol requirement has gone up significantly. She's not getting any relief with albuterol MDI or nebulizers. She does not have anyone to take care of her at home she has been in the bed most of the day. Her last by mouth intake was yesterday morning. She did not call the  clinic or come to the emergency department earlier hoping that she will start feeling better but she could not take it anymore yesterday and came to the emergency department in the evening. She's also been having some chest discomfort mostly in the epigastric area which she describes as burning. She has been dizzy and lightheaded the onset of symptoms. She's also been coughing and coughing up whitish phlegm in the past 2-3 days. She's been out of her albuterol 2 days ago and has just been using nebulizers almost throughout the day. She continues to smoke and her last cigarette was yesterday morning.   Her other complaints are feeling that food gets stuck in the epigastric area since the last 4-5 months. Even water gets stuck and she has to drink it may slowly otherwise she would vomit it. This has reduced her appetite and by mouth intake significantly 3-4 months. This has not been evaluated. She feels weak and tired throughout the day and says that she has lost weight (3 pounds in our system since August 2012)   Hospital Course by problem list:  Acute on chronic respiratory failure - Pt came in with hypoxia down to saturation of 84% and was on BIPAP briefly in the ED but due to the patient's discomfort stopped this and was on non-rebreather and oxygen therapy. She was given antibiotics and steroids in the ED to help with her breathing as well as nebulizer treatments continuously. She was monitored in the step down unit for 2 days during which time her breathing was much improved and gradually she was back on her home dose of oxygen and her antibiotics and steroids were switched to PO from IV. She worked with PT during this time and was recommended and given in hospital a cane to take home with her. She will get several nursing visits to check on her after discharge. She did recover and was given a steroid taper which was explained to her and levaquin for 6 days after leaving the hospital. Please check on her  breathing status. We did restart advair during this stay and encourage her to use that daily even if her breathing is not bad. She does use home oxygen (2 L) and nebulizers at home on a daily basis. The clinic was closed on day of discharge therefore patient will be called with appointment time.    TOBACCO USE - She does continue to smoke and did advise her to quit smoking and she is contemplating this. Please check to see if she wants to quit at follow up visit.    COPD (chronic obstructive pulmonary disease) - This was in acute exacerbation and please check on her clinical status at follow up. Did start advair back during this stay so please encourage her to continue taking this daily.    Hypertension - Did clarify her regimen as she has bad COPD we felt that beta blocker did not have special indication in her and stopped it. Did start amlodipine for her and she had not been taking the HCTZ that was on her list so we did start that at reduced dose (12.5 mg daily). Please check on her vitals at follow up and adjust as needed.    History of lung cancer s/p radiation therapy - RUL non-small cell. Several lymph nodes enlarged on CT but stable or decreased in size. Lung nodule is smaller than on past CT so not active at this time. History of radiation to the lungs probably contributes to her breathing problems overall.   Discharge Vitals:  BP 158/86  Pulse 87  Temp(Src) 97.4 F (36.3 C) (Oral)  Resp 20  Ht 5' (1.524 m)  Wt 94 lb 2.2 oz (42.7 kg)  BMI 18.38 kg/m2  SpO2 90%  Discharge Labs: No results found for this or any previous visit (from the past 24 hour(s)).  SignedGenella Mech 12/30/2011, 9:41 AM

## 2012-01-11 ENCOUNTER — Encounter: Payer: Medicaid Other | Admitting: Internal Medicine

## 2012-01-12 ENCOUNTER — Telehealth: Payer: Self-pay | Admitting: *Deleted

## 2012-01-18 ENCOUNTER — Telehealth: Payer: Self-pay | Admitting: *Deleted

## 2012-01-18 NOTE — Telephone Encounter (Signed)
She needs services but we can't force them on her.  I will discuss it at her follow up if she shows and will go from there.

## 2012-01-18 NOTE — Telephone Encounter (Signed)
Call from Evangeline Gula with [facility] _ [phone] She is informing us that they did contact pt and she refused services. She had 3 - 4 visits that no  One was able to contact her.  This is just to inform you and be sure it's okay to discharge her. Pt has appointment with you on 4/19

## 2012-01-20 ENCOUNTER — Encounter: Payer: Medicaid Other | Admitting: Internal Medicine

## 2012-01-25 NOTE — Telephone Encounter (Signed)
Opened in error

## 2012-02-02 ENCOUNTER — Other Ambulatory Visit (HOSPITAL_COMMUNITY): Payer: Self-pay | Admitting: Internal Medicine

## 2012-02-24 ENCOUNTER — Encounter: Payer: Medicaid Other | Admitting: Internal Medicine

## 2012-03-05 ENCOUNTER — Other Ambulatory Visit (HOSPITAL_COMMUNITY): Payer: Self-pay | Admitting: Internal Medicine

## 2012-04-13 ENCOUNTER — Encounter (HOSPITAL_COMMUNITY): Payer: Self-pay | Admitting: Internal Medicine

## 2012-04-13 ENCOUNTER — Inpatient Hospital Stay (HOSPITAL_COMMUNITY)
Admission: EM | Admit: 2012-04-13 | Discharge: 2012-04-18 | DRG: 193 | Disposition: A | Discharge status: Home / Self Care | Payer: Medicaid Other | Source: Ambulatory Visit | Attending: Internal Medicine | Admitting: Internal Medicine

## 2012-04-13 ENCOUNTER — Emergency Department (HOSPITAL_COMMUNITY): Payer: Medicaid Other

## 2012-04-13 DIAGNOSIS — J4489 Other specified chronic obstructive pulmonary disease: Secondary | ICD-10-CM

## 2012-04-13 DIAGNOSIS — Z9981 Dependence on supplemental oxygen: Secondary | ICD-10-CM

## 2012-04-13 DIAGNOSIS — E46 Unspecified protein-calorie malnutrition: Secondary | ICD-10-CM | POA: Diagnosis present

## 2012-04-13 DIAGNOSIS — J9612 Chronic respiratory failure with hypercapnia: Secondary | ICD-10-CM | POA: Diagnosis present

## 2012-04-13 DIAGNOSIS — J441 Chronic obstructive pulmonary disease with (acute) exacerbation: Secondary | ICD-10-CM

## 2012-04-13 DIAGNOSIS — J189 Pneumonia, unspecified organism: Secondary | ICD-10-CM

## 2012-04-13 DIAGNOSIS — F17200 Nicotine dependence, unspecified, uncomplicated: Secondary | ICD-10-CM

## 2012-04-13 DIAGNOSIS — IMO0002 Reserved for concepts with insufficient information to code with codable children: Secondary | ICD-10-CM

## 2012-04-13 DIAGNOSIS — C3490 Malignant neoplasm of unspecified part of unspecified bronchus or lung: Secondary | ICD-10-CM

## 2012-04-13 DIAGNOSIS — A599 Trichomoniasis, unspecified: Secondary | ICD-10-CM

## 2012-04-13 DIAGNOSIS — F1020 Alcohol dependence, uncomplicated: Secondary | ICD-10-CM | POA: Diagnosis present

## 2012-04-13 DIAGNOSIS — I10 Essential (primary) hypertension: Secondary | ICD-10-CM

## 2012-04-13 DIAGNOSIS — Z923 Personal history of irradiation: Secondary | ICD-10-CM

## 2012-04-13 DIAGNOSIS — J449 Chronic obstructive pulmonary disease, unspecified: Secondary | ICD-10-CM | POA: Diagnosis present

## 2012-04-13 DIAGNOSIS — Z85118 Personal history of other malignant neoplasm of bronchus and lung: Secondary | ICD-10-CM

## 2012-04-13 DIAGNOSIS — I498 Other specified cardiac arrhythmias: Secondary | ICD-10-CM | POA: Diagnosis present

## 2012-04-13 DIAGNOSIS — R000 Tachycardia, unspecified: Secondary | ICD-10-CM

## 2012-04-13 DIAGNOSIS — Z91199 Patient's noncompliance with other medical treatment and regimen due to unspecified reason: Secondary | ICD-10-CM

## 2012-04-13 DIAGNOSIS — J9620 Acute and chronic respiratory failure, unspecified whether with hypoxia or hypercapnia: Secondary | ICD-10-CM

## 2012-04-13 DIAGNOSIS — Z9119 Patient's noncompliance with other medical treatment and regimen: Secondary | ICD-10-CM

## 2012-04-13 LAB — URINALYSIS, ROUTINE W REFLEX MICROSCOPIC
Ketones, ur: 15 mg/dL — AB
Nitrite: NEGATIVE
Protein, ur: 30 mg/dL — AB

## 2012-04-13 LAB — CBC WITH DIFFERENTIAL/PLATELET
Basophils Absolute: 0 10*3/uL (ref 0.0–0.1)
Eosinophils Absolute: 0 10*3/uL (ref 0.0–0.7)
Eosinophils Relative: 0 % (ref 0–5)
Lymphocytes Relative: 6 % — ABNORMAL LOW (ref 12–46)
MCH: 29.1 pg (ref 26.0–34.0)
MCV: 91.5 fL (ref 78.0–100.0)
Platelets: 302 10*3/uL (ref 150–400)
RDW: 16.8 % — ABNORMAL HIGH (ref 11.5–15.5)
WBC: 15.5 10*3/uL — ABNORMAL HIGH (ref 4.0–10.5)

## 2012-04-13 LAB — TROPONIN I: Troponin I: <0.3 ng/mL (ref <0.30)

## 2012-04-13 LAB — MRSA PCR SCREENING: MRSA by PCR: NEGATIVE

## 2012-04-13 LAB — URINE MICROSCOPIC-ADD ON

## 2012-04-13 LAB — HEPATIC FUNCTION PANEL
AST: 20 U/L (ref 0–37)
Albumin: 3 g/dL — ABNORMAL LOW (ref 3.5–5.2)
Bilirubin, Direct: 0.1 mg/dL (ref 0.0–0.3)
Total Bilirubin: 0.2 mg/dL — ABNORMAL LOW (ref 0.3–1.2)

## 2012-04-13 LAB — BLOOD GAS, ARTERIAL
Bicarbonate: 24.7 mEq/L — ABNORMAL HIGH (ref 20.0–24.0)
Patient temperature: 98.6
pH, Arterial: 7.28 — ABNORMAL LOW (ref 7.350–7.450)

## 2012-04-13 LAB — LACTIC ACID, PLASMA: Lactic Acid, Venous: 0.9 mmol/L (ref 0.5–2.2)

## 2012-04-13 LAB — BASIC METABOLIC PANEL
Calcium: 9.8 mg/dL (ref 8.4–10.5)
GFR calc non Af Amer: >90 mL/min (ref >90)
Sodium: 134 mEq/L — ABNORMAL LOW (ref 135–145)

## 2012-04-13 LAB — MAGNESIUM: Magnesium: 1.5 mg/dL (ref 1.5–2.5)

## 2012-04-13 MED ORDER — METHYLPREDNISOLONE SODIUM SUCC 125 MG IJ SOLR
125.0000 mg | Freq: Once | INTRAMUSCULAR | Status: DC
  Filled 2012-04-13: qty 2

## 2012-04-13 MED ORDER — IPRATROPIUM BROMIDE 0.02 % IN SOLN
0.5000 mg | RESPIRATORY_TRACT | Status: AC
  Administered 2012-04-13: 0.5 mg via RESPIRATORY_TRACT
  Filled 2012-04-13: qty 2.5

## 2012-04-13 MED ORDER — ONDANSETRON HCL 4 MG/2ML IJ SOLN: 4.0000 mg | Freq: Four times a day (QID) | INTRAMUSCULAR | Status: DC | PRN

## 2012-04-13 MED ORDER — ONDANSETRON HCL 4 MG PO TABS: 4.0000 mg | ORAL_TABLET | Freq: Four times a day (QID) | ORAL | Status: DC | PRN

## 2012-04-13 MED ORDER — ACETAMINOPHEN 325 MG PO TABS
650.0000 mg | ORAL_TABLET | Freq: Once | ORAL | Status: AC
  Administered 2012-04-13: 650 mg via ORAL
  Filled 2012-04-13: qty 2

## 2012-04-13 MED ORDER — PREDNISONE 20 MG PO TABS
40.0000 mg | ORAL_TABLET | Freq: Two times a day (BID) | ORAL | Status: DC
  Filled 2012-04-13 (×2): qty 2

## 2012-04-13 MED ORDER — ONDANSETRON HCL 4 MG/2ML IJ SOLN: 4.0000 mg | Freq: Three times a day (TID) | INTRAMUSCULAR | Status: DC | PRN

## 2012-04-13 MED ORDER — SODIUM CHLORIDE 0.9 % IJ SOLN
3.0000 mL | Freq: Two times a day (BID) | INTRAMUSCULAR | Status: DC
  Administered 2012-04-13 – 2012-04-18 (×11): 3 mL via INTRAVENOUS

## 2012-04-13 MED ORDER — ALBUTEROL SULFATE (5 MG/ML) 0.5% IN NEBU
5.0000 mg | INHALATION_SOLUTION | Freq: Once | RESPIRATORY_TRACT | Status: AC
  Administered 2012-04-13: 5 mg via RESPIRATORY_TRACT
  Filled 2012-04-13: qty 1

## 2012-04-13 MED ORDER — SODIUM CHLORIDE 0.9 % IV BOLUS (SEPSIS)
1000.0000 mL | Freq: Once | INTRAVENOUS | Status: AC
  Administered 2012-04-13: 1000 mL via INTRAVENOUS

## 2012-04-13 MED ORDER — GUAIFENESIN ER 600 MG PO TB12
600.0000 mg | ORAL_TABLET | Freq: Two times a day (BID) | ORAL | Status: DC
  Filled 2012-04-13 (×2): qty 1

## 2012-04-13 MED ORDER — LABETALOL HCL 5 MG/ML IV SOLN: 10.0000 mg | INTRAVENOUS | Status: DC | PRN

## 2012-04-13 MED ORDER — METRONIDAZOLE IVPB CUSTOM: 2.0000 g | Freq: Three times a day (TID) | INTRAVENOUS | Status: DC

## 2012-04-13 MED ORDER — METRONIDAZOLE IVPB CUSTOM
2.0000 g | Freq: Once | INTRAVENOUS | Status: AC
  Administered 2012-04-13: 2 g via INTRAVENOUS
  Filled 2012-04-13: qty 400

## 2012-04-13 MED ORDER — ALBUTEROL SULFATE (5 MG/ML) 0.5% IN NEBU
2.5000 mg | INHALATION_SOLUTION | RESPIRATORY_TRACT | Status: DC | PRN
  Filled 2012-04-13: qty 0.5

## 2012-04-13 MED ORDER — ALBUTEROL SULFATE (5 MG/ML) 0.5% IN NEBU: 2.5000 mg | INHALATION_SOLUTION | RESPIRATORY_TRACT | Status: DC

## 2012-04-13 MED ORDER — LEVOFLOXACIN IN D5W 500 MG/100ML IV SOLN
500.0000 mg | INTRAVENOUS | Status: DC
  Administered 2012-04-13 – 2012-04-15 (×3): 500 mg via INTRAVENOUS
  Filled 2012-04-13 (×3): qty 100

## 2012-04-13 MED ORDER — IPRATROPIUM BROMIDE 0.02 % IN SOLN: 0.5000 mg | RESPIRATORY_TRACT | Status: DC | PRN

## 2012-04-13 MED ORDER — ALBUTEROL (5 MG/ML) CONTINUOUS INHALATION SOLN: 10.0000 mg/h | INHALATION_SOLUTION | RESPIRATORY_TRACT | Status: DC

## 2012-04-13 MED ORDER — ACETAMINOPHEN 325 MG PO TABS
650.0000 mg | ORAL_TABLET | Freq: Four times a day (QID) | ORAL | Status: DC | PRN
  Administered 2012-04-16 – 2012-04-18 (×2): 650 mg via ORAL
  Filled 2012-04-13 (×2): qty 2

## 2012-04-13 MED ORDER — METHYLPREDNISOLONE SODIUM SUCC 125 MG IJ SOLR
80.0000 mg | Freq: Three times a day (TID) | INTRAMUSCULAR | Status: DC
  Administered 2012-04-13 – 2012-04-15 (×6): 80 mg via INTRAVENOUS
  Filled 2012-04-13 (×7): qty 1.28
  Filled 2012-04-13: qty 2
  Filled 2012-04-13: qty 1.28

## 2012-04-13 MED ORDER — MIRTAZAPINE 15 MG PO TABS
15.0000 mg | ORAL_TABLET | Freq: Every day | ORAL | Status: DC
  Filled 2012-04-13: qty 1

## 2012-04-13 MED ORDER — AMLODIPINE BESYLATE 10 MG PO TABS
10.0000 mg | ORAL_TABLET | Freq: Every day | ORAL | Status: DC
  Filled 2012-04-13: qty 1

## 2012-04-13 MED ORDER — METHYLPREDNISOLONE SODIUM SUCC 125 MG IJ SOLR
125.0000 mg | Freq: Once | INTRAMUSCULAR | Status: AC
  Administered 2012-04-13: 125 mg via INTRAVENOUS

## 2012-04-13 MED ORDER — ALBUTEROL SULFATE (5 MG/ML) 0.5% IN NEBU
2.5000 mg | INHALATION_SOLUTION | Freq: Four times a day (QID) | RESPIRATORY_TRACT | Status: DC
  Administered 2012-04-13 – 2012-04-14 (×5): 2.5 mg via RESPIRATORY_TRACT
  Filled 2012-04-13 (×4): qty 0.5

## 2012-04-13 MED ORDER — ASPIRIN 81 MG PO CHEW: 81.0000 mg | CHEWABLE_TABLET | Freq: Every day | ORAL | Status: DC

## 2012-04-13 MED ORDER — IPRATROPIUM BROMIDE 0.02 % IN SOLN
0.5000 mg | Freq: Four times a day (QID) | RESPIRATORY_TRACT | Status: DC
  Administered 2012-04-13 – 2012-04-15 (×9): 0.5 mg via RESPIRATORY_TRACT
  Filled 2012-04-13 (×9): qty 2.5

## 2012-04-13 MED ORDER — ENOXAPARIN SODIUM 40 MG/0.4ML ~~LOC~~ SOLN
40.0000 mg | SUBCUTANEOUS | Status: DC
  Administered 2012-04-13 – 2012-04-15 (×3): 40 mg via SUBCUTANEOUS
  Filled 2012-04-13 (×5): qty 0.4

## 2012-04-13 MED ORDER — FLUTICASONE-SALMETEROL 250-50 MCG/DOSE IN AEPB
1.0000 | INHALATION_SPRAY | Freq: Two times a day (BID) | RESPIRATORY_TRACT | Status: DC
  Administered 2012-04-13 – 2012-04-18 (×8): 1 via RESPIRATORY_TRACT
  Filled 2012-04-13 (×3): qty 14

## 2012-04-13 MED ORDER — ASPIRIN 81 MG PO TABS: 81.0000 mg | ORAL_TABLET | Freq: Every day | ORAL | Status: DC

## 2012-04-13 MED ORDER — SODIUM CHLORIDE 0.9 % IV SOLN
INTRAVENOUS | Status: AC
  Administered 2012-04-13: 13:00:00 via INTRAVENOUS

## 2012-04-13 MED ORDER — ACETAMINOPHEN 650 MG RE SUPP: 650.0000 mg | Freq: Four times a day (QID) | RECTAL | Status: DC | PRN

## 2012-04-13 NOTE — H&P (Signed)
Hospital Admission Note Date: 04/13/2012  Patient name: Karina Weber Medical record number: [id number] Date of birth: [birth year] Age: 56 y.o. Gender: female PCP: Karina Giarratano, MD  Medical Service: Internal Medicine Teaching Service, [facility] Service  Attending physician:  Dr. Thomos Lemons    1st Contact: Dr. Elenor Legato  Pager: [phone] 2nd Contact: Dr. Carrolyn Meiers  Pager: [phone] After 5 pm or weekends: 1st Contact:      Pager: [phone] 2nd Contact:      Pager: [phone]  Chief Complaint: Shortness of breath.  History of Present Illness: [name] is a 56 year old woman with a PMH significant for COPD, HTN, non-small cell lung cancer s/p radiation therapy, alcoholism, and tobacco abuse who presents to the [facility] ED complaining of shortness of breath.  She states she ran out of her medications for her COPD last Wednesday and was unable to get a refill with the July 4th holiday.  She also states that for the last week or so she has had fevers, chills, and an increased cough with little to no production.  She denies sick contacts, diarrhea, or constipation.   She also states that over the last few days she has been more nauseated and has vomited several times.  She states that when she vomits she has epigastric pain and has noticed a small amount of bright red blood in her vomit.  She states that the abdominal pain gets worse if she eats and gets better when she doesn't eat.  She denies diarrhea, blood in her stool, or black tarry stools.    Meds: Current Outpatient Prescriptions on File Prior to Encounter  Medication Sig Dispense Refill  . albuterol (PROVENTIL) (2.5 MG/3ML) 0.083% nebulizer solution Take 3 mLs (2.5 mg total) by nebulization every 6 (six) hours as needed for wheezing.  75 mL  12  . albuterol (VENTOLIN HFA) 108 (90 BASE) MCG/ACT inhaler Inhale 1 puff into the lungs every 4 (four) hours as needed for wheezing.  18 g  6  . aspirin 81 MG tablet Take 1 tablet (81 mg total) by mouth  daily.  31 tablet  1  . Fluticasone-Salmeterol (ADVAIR) 250-50 MCG/DOSE AEPB Inhale 1 puff into the lungs every 12 (twelve) hours.  60 each  11  . guaiFENesin (MUCINEX) 600 MG 12 hr tablet Take 1 tablet (600 mg total) by mouth 2 (two) times daily.  30 tablet  0  . ipratropium (ATROVENT) 0.02 % nebulizer solution Take 2.5 mLs (500 mcg total) by nebulization 4 (four) times daily.  75 mL  11  . DISCONTD: ferrous gluconate (FERGON) 325 MG tablet Take 325 mg by mouth 3 (three) times daily with meals.        Marland Kitchen DISCONTD: losartan (COZAAR) 100 MG tablet Take 1 tablet (100 mg total) by mouth daily.  31 tablet  11   Allergies: Allergies as of 04/13/2012 - Review Complete 04/13/2012  Allergen Reaction Noted  . Lisinopril Cough 07/28/2011   Past Medical History  Diagnosis Date  . Tobacco abuse   . COPD (chronic obstructive pulmonary disease)     exacerbagtion in 6/12 and in 2010.   Marland Kitchen Hypertension   . History of radiation therapy 07/19/10 to 07/28/10    RUL lung  . 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 Alcohol abuse     2 beers a day   Past Surgical History  Procedure Date  . No past surgeries    Family History  Problem Relation  Age of Onset  . Heart disease Neg Hx   . Cancer Mother     unknown type   History   Social History  . Marital Status: Single    Spouse Name: N/A    Number of Children: N/A  . Years of Education: N/A   Occupational History  . Not on file.   Social History Main Topics  . Smoking status: Smoker, Current Status Unknown -- 1.0 packs/day for 40 years    Types: Cigarettes  . Smokeless tobacco: Never Used   Comment: Cut back from 2 ppd when diagnosed with Lung cancer.12/02/11 1 cig/day  . Alcohol Use: No     Former alcoholic  . Drug Use: No  . Sexually Active: Not Currently   Other Topics Concern  . Not on file   Social History Narrative   Lives at home with a roommate (the roommate's son had a kid with the patient's daughter but they are not  married). She takes care of herself, does not have any assistance when she is sick. He is able to perform ADLs when she is not sick . Used to work in [employer], retired 2 years ago . Said it up to 10th grade . SHe on Medicaid now. Smokes 1 pack per day. Last smoke yesterday. Drinks 2 cans of beer every day. Never did drugs .   Review of Systems: Constitutional: Positive for fever, chills. Denies diaphoresis, appetite change and fatigue.  HEENT: Denies photophobia, eye pain, redness, hearing loss, ear pain, congestion, sore throat, rhinorrhea, sneezing, mouth sores, trouble swallowing, neck pain, neck stiffness and tinnitus.   Respiratory: Positive for SOB.  Denies DOE, cough, chest tightness,  and wheezing.   Cardiovascular: Denies chest pain, palpitations and leg swelling.  Gastrointestinal: Positive for nausea, vomiting, abdominal pain. Denies diarrhea, constipation, blood in stool and abdominal distention.  Genitourinary: Denies dysuria, urgency, frequency, hematuria, flank pain and difficulty urinating.  Musculoskeletal: Denies myalgias, back pain, joint swelling, arthralgias and gait problem.  Skin: Denies pallor, rash and wound.  Neurological: Denies dizziness, seizures, syncope, weakness, light-headedness, numbness and headaches.  Hematological: Denies adenopathy. Easy bruising, personal or family bleeding history  Psychiatric/Behavioral: Denies suicidal ideation, mood changes, confusion, nervousness, sleep disturbance and agitation  Physical Exam: Blood pressure 109/56, pulse 132, temperature 98.7 F (37.1 C), temperature source Oral, resp. rate 28, SpO2 94.00%. Constitutional: Vital signs reviewed.  Patient is a cachectic woman in moderate distress from shortness of breath.  She is cooperative with exam. Alert and oriented x3.  Head: Normocephalic and atraumatic Ear: TM normal bilaterally Mouth: no erythema or exudates, MMM Eyes: PERRL, EOMI, conjunctivae normal, No scleral icterus.   Neck: Supple, Trachea midline normal ROM, No JVD, mass, thyromegaly, or carotid bruit present.  Cardiovascular: tachycardic but regular rhythm, S1 normal, S2 normal, no MRG, pulses symmetric and intact bilaterally Pulmonary/Chest: prominent expiratory wheezes in all lung fields, no crackles or rhonchi.  Abdominal: Soft. Moderate epigastric tenderness, non-distended, bowel sounds are normal, no masses, organomegaly, or guarding present.  GU: no CVA tenderness Musculoskeletal: No joint deformities, erythema, or stiffness, ROM full and no nontender Hematology: no cervical, inginal, or axillary adenopathy.  Neurological: A&O x3, Strength is normal and symmetric bilaterally, cranial nerve II-XII are grossly intact, no focal motor deficit, sensory intact to light touch bilaterally.  Skin: Warm, dry and intact. No rash, cyanosis, or clubbing.  Psychiatric: Normal mood and affect. speech and behavior is normal. Judgment and thought content normal. Cognition and memory are normal.  Lab results: Basic Metabolic Panel:  Basename 04/13/12 0900  NA 134*  K 4.3  CL 92*  CO2 29  GLUCOSE 104*  BUN 11  CREATININE 0.45*  CALCIUM 9.8  MG 1.5  PHOS --   Liver Function Tests:  Basename 04/13/12 0900  AST 20  ALT 7  ALKPHOS 166*  BILITOT 0.2*  PROT 7.9  ALBUMIN 3.0*   CBC:  Basename 04/13/12 0900  WBC 15.5*  NEUTROABS 13.1*  HGB 12.4  HCT 39.0  MCV 91.5  PLT 302   Cardiac Enzymes:  Basename 04/13/12 0900  CKTOTAL --  CKMB --  CKMBINDEX --  TROPONINI <0.30   Urinalysis:  Basename 04/13/12 1041  COLORURINE YELLOW  LABSPEC 1.017  PHURINE 5.5  GLUCOSEU NEGATIVE  HGBUR TRACE*  BILIRUBINUR SMALL*  KETONESUR 15*  PROTEINUR 30*  UROBILINOGEN 0.2  NITRITE NEGATIVE  LEUKOCYTESUR LARGE*   Imaging results:  Dg Chest Port 1 View  04/13/2012  *RADIOLOGY REPORT*  Clinical Data: Shortness of breath, chest pain  PORTABLE CHEST - 1 VIEW  Comparison: 12/26/2011; 03/14/2011; Chest CT  - 12/27/2011; 12/21/2011  Findings: Unchanged enlarged cardiac silhouette and mediastinal contours. Minimal increase in mild diffuse thickening of the pulmonary interstitium most conspicuous within the bilateral mid and lower lungs, right greater than left.  There is minimal thickening along the right minor fissure.  No definite pleural effusion or pneumothorax.  Unchanged bones.  IMPRESSION: Mild thickening of the pulmonary interstitium with the bilateral mid and lower lungs, right greater than left with small amount of fluid tracking in the right minor fissure.  These findings are nonspecific but may be seen in the setting of pulmonary edema, though atypical infection may have a similar appearance.  Clinical correlation is advised.  Further evaluation with a PA and lateral chest radiograph may be obtained as clinically indicated.  Original Report Authenticated By: Waynard Reeds, M.D.   Assessment & Plan by Problem: 1. CAP:  Ms. Montalvo is a 56 year old woman with the PMH listed above who presented with shortness of breath.  She has been without her medications for COPD for over a week and has had a week of fevers, chills, and increasing shortness of breath.  There is a probable infiltrate on the chest x-ray. This, coupled with her increased WBC count all point to a pneumonia.  She has not been hospitalized since March of 2013.  She was markedly hypoxic on admission but improved with nebulizers, steroids, and supplemental oxygen.   - Admit to Stepdown  - ABG to assess need for BiPAP  - Solumedrol 80 mg IV Q8 per CCM recommendation  - Albuterol/Atrovent Nebs scheduled and PRN  - Levoquin 500 mg IV daily  - BiPAP PRN for increased work of breathing  2.  Acute on Chronic respiratory failure:  On arrival she was markedly hypoxic.  ABG done showed 7.280/54.4/64.1/26.4. Her measured Bicarb was 29 on her Bmet.  She as a chronic CO2 retention but also has a respiratory acidosis acutely that may be from  supplemental oxygen therapy.  We will need to watch closely and assess for the need for BiPAP.  She states that if it becomes necessary that she would agree to intubation and full medical treatment.  PCCM has seen her and is aware.  We will likely repeat her ABG later today and make a decision for starting BiPAP.  Currently she appears more comfortable and is saturating well on the nasal cannula.  - BiPAP PRN  -  Low threshold for BiPAP or possible intubation.  - NPO for possible BiPAP  3. COPD:  She has been without her COPD medications for the last week and a half which likely precipitated this admission.  She has significant wheezes on exam today which are compounding her breathing problem that is coming from the CAP.   - Treatment per #1  - Consider PCCM suggestion of discharge with nebulized inhaled steroids and LABA secondary to significant hyperexpansion.  4. Trichomonal infection:  Ms. Tison had pyuria as well as trichomonas in her urine today.  Culture is pending for other possibilities but we will treat with metronidazole 2 g once  5.  Tobacco abuse: She continues to smoke several cigarettes daily.  We will give her a nicotine patch while she is here to help her and encourage her to quit completely.  6.  Protein Calorie malnutrition and underweight: She is significantly underweight.  We will consult nutrition for assistance with supplements.  7.  Hypertension: She is well controlled her home medications.  Since she is NPO we will treat with IV labetolol until she is cleared to eat.    8. VTE: Lovenox  Signed: Clara Smolen 04/13/2012, 2:09 PM

## 2012-04-13 NOTE — ED Notes (Signed)
Patient oxygen sats in triage 55% on room air. Placed patient on 4L oxygen on nasal canula. Sats now 82%.

## 2012-04-13 NOTE — ED Provider Notes (Signed)
Level V caveat seen on arrival patient short of breath complains of shortness of breath with cough effective of white sputum. Patient also complains of chest pain, pleuritic in nature and worse with coughing. On exam speaks in sentences lungs with prolonged expiratory phase diminished breath sounds diffusely, mild retractions. 10:15 AM patient's breathing treatment is improved after treatment with oxygen and nebulizers. She speaks in sentences. No retractions. CRITICAL CARE Performed by: Doug Sou   Total critical care time: 30 minute  Critical care time was exclusive of separately billable procedures and treating other patients.  Critical care was necessary to treat or prevent imminent or life-threatening deterioration.  Critical care was time spent personally by me on the following activities: development of treatment plan with patient and/or surrogate as well as nursing, discussions with consultants, evaluation of patient's response to treatment, examination of patient, obtaining history from patient or surrogate, ordering and performing treatments and interventions, ordering and review of laboratory studies, ordering and review of radiographic studies, pulse oximetry and re-evaluation of patient's condition.  Doug Sou, MD 04/13/12 1015

## 2012-04-13 NOTE — Consult Note (Signed)
Name: Karina Weber MRN: [id number] DOB: 1956/06/12    LOS: 0  Referring Provider:  Danise Edge Reason for Referral:  COPD with respiratory failure.  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  Patient responded well to neb treatment but with exertion was in severe respiratory distress.  PCCM called on consultation for active respiratory distress and hypotension.  Past Medical History  Diagnosis Date  . Tobacco abuse   . COPD (chronic obstructive pulmonary disease)     exacerbagtion in 6/12 and in 2010.   Marland Kitchen Hypertension   . History of radiation therapy 07/19/10 to 07/28/10    RUL lung  . 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 Alcohol abuse     2 beers a day   Past Surgical History  Procedure Date  . No past surgeries    Prior to Admission medications   Medication Sig Start Date End Date Taking? Authorizing Provider  albuterol (PROVENTIL) (2.5 MG/3ML) 0.083% nebulizer solution Take 3 mLs (2.5 mg total) by nebulization every 6 (six) hours as needed for wheezing. 12/30/11 12/29/12 Yes Judie Bonus, MD  albuterol (VENTOLIN HFA) 108 (90 BASE) MCG/ACT inhaler Inhale 1 puff into the lungs every 4 (four) hours as needed for wheezing. 12/30/11  Yes Judie Bonus, MD  amLODipine (NORVASC) 10 MG tablet Take 10 mg by mouth daily.   Yes Historical Provider, MD  aspirin 81 MG tablet Take 1 tablet (81 mg total) by mouth daily. 03/15/11  Yes Zollie Beckers, MD  Fluticasone-Salmeterol (ADVAIR) 250-50 MCG/DOSE AEPB Inhale 1 puff into the lungs every 12 (twelve) hours. 12/30/11  Yes Judie Bonus, MD  guaiFENesin (MUCINEX) 600 MG 12 hr tablet Take 1 tablet (600 mg total) by mouth 2 (two) times daily. 12/30/11 12/29/12 Yes Judie Bonus, MD  hydrochlorothiazide (HYDRODIURIL) 12.5 MG tablet Take 12.5 mg by mouth daily.    Yes Historical Provider, MD  ipratropium (ATROVENT) 0.02 % nebulizer solution Take 2.5 mLs (500 mcg total) by nebulization 4 (four) times daily. 12/30/11  Yes Judie Bonus, MD  mirtazapine (REMERON) 15 MG tablet Take 15 mg by mouth at bedtime.   Yes Historical Provider, MD   Allergies Allergies  Allergen Reactions  . Lisinopril Cough    Family History Family History  Problem Relation Age of Onset  . Heart disease Neg Hx   . Cancer Mother     unknown type   Social History  reports that she has been smoking Cigarettes.  She has a 40 pack-year smoking history. She has never used smokeless tobacco. She reports that she does not drink alcohol or use illicit drugs.  Review Of Systems:  Negative other than mentioned above.  Brief patient description:  56 year old active smoker with pneumonia and COPD exacerbation.  Current Status: Guarded.  Vital Signs: Temp:  [98.7 F (37.1 C)-102.3 F (39.1 C)] 98.7 F (37.1 C) (07/12 1232) Pulse Rate:  [70-135] 132  (07/12 1232) Resp:  [18-29] 28  (07/12 1232) BP: (103-131)/(53-91) 109/56 mmHg (07/12 1232) SpO2:  [55 %-98 %] 85 % (07/12 1232)  Physical Examination: General:  Cachectic AAF in moderate respiratory distress. Neuro:  Alert, oriented and interactive, moves all ext to command. HEENT:  [place]/AT, PERRL, EOM-I, -LAN and DMM. Neck:  Supple, -LAN and -thyromegally. Cardiovascular:  RRR, Nl S1/S2, -M/R/G. Lungs:  Decreased BS in all lung fields. Abdomen:  Soft, NT, ND and +BS. Musculoskeletal:  -edema and -tenderness. Skin:  Intact.  Principal Problem:  *CAP (community acquired pneumonia) Active Problems:  TOBACCO USE  ESSENTIAL HYPERTENSION  COPD  Acute-on-chronic respiratory failure   ASSESSMENT AND PLAN  PULMONARY No results found for this basename: PHART:5,PCO2:5,PCO2ART:5,PO2ART:5,HCO3:5,O2SAT:5 in the last 168 hours Ventilator Settings:   CXR:  Hyperinflation with RLL airspace disease. ETT:  N/A.  A:  COPD  and PNA in an active smoker.  Active respiratory distress that responded to nebs and O2. P:   - Continue O2, titrate for sat of 88-92%. - Agree with steroids, would do solumedrol 80 mg IV q8 hours for now. - Levaquin is an excellent choice of abx in this case (last admission was in march). - Recommend a nebulized steroids at or prior to discharge as I highly doubt patient can generate enough flow to inhale any MDI/HFA with such hyper inflation. - Make BiPAP available as PRN for comfort. - Review ABG. - CXR as discussed with IM resident. - Smoking cessation. - Ongoing discussion of code status, I spoke with patient and she wishes for all interventions, however, if intubated I can see very little chance for her to extubate. - IS and flutter valve to encourage airway clearance. - Early mobilization. - Will need PFT's once acute event is complete and will need f/u with a pulmonary MD. - Would consider an echo and BNP to see if there are any cardiac defects as that would only make matters worse. - PCCM will follow with you.  [name], M.D. Pulmonary and Critical Care Medicine [facility] Pager: [phone]  04/13/2012, 1:05 PM

## 2012-04-13 NOTE — ED Provider Notes (Signed)
History     CSN: [id number]  Arrival date & time 04/13/12  0827   First MD Initiated Contact with Patient 04/13/12 [phone]      No chief complaint on file.   (Consider location/radiation/quality/duration/timing/severity/associated sxs/prior treatment) HPI History from patient. 56 year old female with history of COPD, hypertension, non-small cell lung cancer presents with shortness of breath. She has been out of her home nebulizer treatments for the past several days. She reports shortness of breath "all week." Has been coughing more than usual which has been productive of white sputum. She reports "feeling hot and cold." She has additionally had some vomiting as well with eating. Patient is followed by Outpatient Clinics. She is quite short of breath on arrival and hypoxic - level V caveat applies due to urgent need for intervention.  Patient initially hypoxic in triage with a sat of 55% on room air. She typically does wear 2 L of oxygen at home. She was placed on 4 L nasal cannula and came up to the 81% with this. Patient seen immediately on arrival.  Past Medical History  Diagnosis Date  . Tobacco abuse   . COPD (chronic obstructive pulmonary disease)     exacerbagtion in 6/12 and in 2010.   Marland Kitchen Hypertension   . History of radiation therapy 07/19/10 to 07/28/10    RUL lung  . 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 Alcohol abuse     2 beers a day    Past Surgical History  Procedure Date  . No past surgeries     Family History  Problem Relation Age of Onset  . Heart disease Neg Hx   . Cancer Mother     unknown type    History  Substance Use Topics  . Smoking status: Smoker, Current Status Unknown -- 1.0 packs/day for 40 years    Types: Cigarettes  . Smokeless tobacco: Never Used   Comment: Cut back from 2 ppd when diagnosed with Lung cancer.12/02/11 1 cig/day  . Alcohol Use: No     Former alcoholic    OB History    Grav Para Term Preterm Abortions TAB SAB Ect  Mult Living                  Review of Systems  Unable to perform ROS: Unstable vital signs    Allergies  Lisinopril  Home Medications   Current Outpatient Rx  Name Route Sig Dispense Refill  . ALBUTEROL SULFATE (2.5 MG/3ML) 0.083% IN NEBU Nebulization Take 3 mLs (2.5 mg total) by nebulization every 6 (six) hours as needed for wheezing. 75 mL 12  . ALBUTEROL SULFATE HFA 108 (90 BASE) MCG/ACT IN AERS Inhalation Inhale 1 puff into the lungs every 4 (four) hours as needed for wheezing. 18 g 6  . AMLODIPINE BESYLATE 10 MG PO TABS Oral Take 10 mg by mouth daily.    . ASPIRIN 81 MG PO TABS Oral Take 1 tablet (81 mg total) by mouth daily. 31 tablet 1  . FLUTICASONE-SALMETEROL 250-50 MCG/DOSE IN AEPB Inhalation Inhale 1 puff into the lungs every 12 (twelve) hours. 60 each 11  . GUAIFENESIN ER 600 MG PO TB12 Oral Take 1 tablet (600 mg total) by mouth 2 (two) times daily. 30 tablet 0  . HYDROCHLOROTHIAZIDE 12.5 MG PO TABS Oral Take 12.5 mg by mouth daily.    . IPRATROPIUM BROMIDE 0.02 % IN SOLN Nebulization Take 2.5 mLs (500 mcg total) by nebulization 4 (four) times  daily. 75 mL 11  . MIRTAZAPINE 15 MG PO TABS Oral Take 15 mg by mouth at bedtime.      BP 108/69  Pulse 70  Temp 102.3 F (39.1 C) (Rectal)  Resp 23  SpO2 91%  Physical Exam  Nursing note and vitals reviewed. Constitutional: She is oriented to person, place, and time. She appears well-developed.       Cachectic appearing, hypoxic, satting 81% on 4 L  HENT:  Head: Normocephalic and atraumatic.  Eyes:       Normal appearance  Neck: Normal range of motion.  Cardiovascular: Normal rate, regular rhythm and normal heart sounds.   Pulmonary/Chest: She is in respiratory distress. She has wheezes. She exhibits no tenderness.       Mild retractions, tripoding, long expiratory phase with diminished breath sounds throughout Speaking in full sentences  Abdominal: Soft. There is no tenderness.  Musculoskeletal: Normal range of  motion.  Neurological: She is alert and oriented to person, place, and time.  Skin: Skin is warm and dry. She is not diaphoretic.  Psychiatric: She has a normal mood and affect.    ED Course  Procedures (including critical care time)   Date: 04/13/2012  Rate: 142  Rhythm: sinus tachycardia  QRS Axis: indeterminate - borderline RAD  Intervals: normal  ST/T Wave abnormalities: normal  Conduction Disutrbances:none  Narrative Interpretation:   Old EKG Reviewed: as compared with December 27, 2011 rate increased  Labs Reviewed  CBC WITH DIFFERENTIAL - Abnormal; Notable for the following:    WBC 15.5 (*)     RDW 16.8 (*)     Neutrophils Relative 84 (*)     Neutro Abs 13.1 (*)     Lymphocytes Relative 6 (*)     Monocytes Absolute 1.5 (*)     All other components within normal limits  BASIC METABOLIC PANEL  TROPONIN I  CULTURE, BLOOD (ROUTINE X 2)  CULTURE, BLOOD (ROUTINE X 2)  LACTIC ACID, PLASMA  URINALYSIS, ROUTINE W REFLEX MICROSCOPIC  URINE CULTURE   Dg Chest Port 1 View  04/13/2012  *RADIOLOGY REPORT*  Clinical Data: Shortness of breath, chest pain  PORTABLE CHEST - 1 VIEW  Comparison: 12/26/2011; 03/14/2011; Chest CT - 12/27/2011; 12/21/2011  Findings: Unchanged enlarged cardiac silhouette and mediastinal contours. Minimal increase in mild diffuse thickening of the pulmonary interstitium most conspicuous within the bilateral mid and lower lungs, right greater than left.  There is minimal thickening along the right minor fissure.  No definite pleural effusion or pneumothorax.  Unchanged bones.  IMPRESSION: Mild thickening of the pulmonary interstitium with the bilateral mid and lower lungs, right greater than left with small amount of fluid tracking in the right minor fissure.  These findings are nonspecific but may be seen in the setting of pulmonary edema, though atypical infection may have a similar appearance.  Clinical correlation is advised.  Further evaluation with a PA and  lateral chest radiograph may be obtained as clinically indicated.  Original Report Authenticated By: Waynard Reeds, M.D.     1. Community acquired pneumonia   2. COPD exacerbation       MDM  11:28 AM Discussed with Dr. Tonny Branch with the IM teaching service who is the pt's PCP. He accepts her for admission to step down. At this time the patient appears considerably more stable and is no respiratory distress although she is still hypoxic with sat of 90% and tachycardic with rate in 130s. She is requesting food.  This patient with a history of lung cancer and COPD presents with shortness of breath. She was seen immediately on arrival by myself and Dr. Ethelda Chick. She was extremely hypoxic on arrival and appeared to be in respiratory distress but considerably improved with a single nebulizer treatment of albuterol/Atrovent and 4 L nasal cannula. She was given a second treatment with additional improvement. She likely has a pneumonia as she is febrile to 102.3 rectally and has a questionable infiltrate on her chest x-ray. She has a leukocytosis of 15,000. Blood cultures ordered and Levaquin started. Tachycardia most likely secondary to fever. Tylenol and fluids ordered for this. Plan discussed with patient who is agreeable.     Grant Fontana, PA-C 04/13/12 1137

## 2012-04-13 NOTE — ED Notes (Signed)
Repeat EKG completed at 0902. Given to Dr. Ethelda Chick.

## 2012-04-13 NOTE — Progress Notes (Signed)
INITIAL ADULT NUTRITION ASSESSMENT Date: 04/13/2012   Time: 2:25 PM Reason for Assessment: consult; underweight  ASSESSMENT: Female 56 y.o.  Dx: CAP (community acquired pneumonia)  Hx:  Past Medical History  Diagnosis Date  . Tobacco abuse   . COPD (chronic obstructive pulmonary disease)     exacerbagtion in 6/12 and in 2010.   Marland Kitchen Hypertension   . History of radiation therapy 07/19/10 to 07/28/10    RUL lung  . 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 Alcohol abuse     2 beers a day   Past Surgical History  Procedure Date  . No past surgeries     Related Meds:  Scheduled Meds:   . sodium chloride   Intravenous STAT  . acetaminophen  650 mg Oral Once  . albuterol  2.5 mg Nebulization Q6H   And  . ipratropium  0.5 mg Nebulization Q6H  . albuterol  5 mg Nebulization Once  . albuterol  5 mg Nebulization Once  . amLODipine  10 mg Oral Daily  . aspirin  81 mg Oral Daily  . enoxaparin (LOVENOX) injection  40 mg Subcutaneous Q24H  . Fluticasone-Salmeterol  1 puff Inhalation Q12H  . guaiFENesin  600 mg Oral BID  . ipratropium  0.5 mg Nebulization STAT  . ipratropium  0.5 mg Nebulization STAT  . levofloxacin (LEVAQUIN) IV  500 mg Intravenous Q24H  . methylPREDNISolone (SOLU-MEDROL) injection  125 mg Intravenous Once  . metronidazole  2 g Intravenous Once  . mirtazapine  15 mg Oral QHS  . predniSONE  40 mg Oral BID WC  . sodium chloride  1,000 mL Intravenous Once  . sodium chloride  1,000 mL Intravenous Once  . sodium chloride  3 mL Intravenous Q12H  . DISCONTD: albuterol  2.5 mg Nebulization Q4H  . DISCONTD: aspirin  81 mg Oral Daily  . DISCONTD: methylPREDNISolone (SOLU-MEDROL) injection  125 mg Intramuscular Once  . DISCONTD: metronidazole  2 g Intravenous Q8H   Continuous Infusions:   . DISCONTD: albuterol     PRN Meds:.acetaminophen, acetaminophen, albuterol, ipratropium, ondansetron (ZOFRAN) IV, ondansetron, DISCONTD: ondansetron (ZOFRAN) IV  Ht:  152.4  cm  Wt:  42.7 kg (last known weight from March)  Ideal Wt:    45.5 kg % Ideal Wt: 93%  Usual Wt: unknown, pt does not know Wt Readings from Last 10 Encounters:  12/28/11 94 lb 2.2 oz (42.7 kg)  12/02/11 92 lb 11.2 oz (42.048 kg)  05/20/11 95 lb (43.092 kg)  05/21/10 87 lb 1.6 oz (39.508 kg)  04/30/10 81 lb (36.741 kg)  04/23/10 77 lb 4.8 oz (35.063 kg)  03/16/10 76 lb 0.6 oz (34.49 kg)  02/12/10 82 lb 4.8 oz (37.331 kg)  12/28/09 81 lb (36.741 kg)  12/01/09 83 lb (37.649 kg)    There is no height or weight on file to calculate BMI.  Food/Nutrition Related Hx: pt reports eating per her usual PTA.  States 'I eat what I want, when I want'  Labs:  CMP     Component Value Date/Time   NA 134* 04/13/2012 0900   K 4.3 04/13/2012 0900   CL 92* 04/13/2012 0900   CO2 29 04/13/2012 0900   GLUCOSE 104* 04/13/2012 0900   BUN 11 04/13/2012 0900   CREATININE 0.45* 04/13/2012 0900   CALCIUM 9.8 04/13/2012 0900   PROT 7.9 04/13/2012 0900   ALBUMIN 3.0* 04/13/2012 0900   AST 20 04/13/2012 0900   ALT 7 04/13/2012  0900   ALKPHOS 166* 04/13/2012 0900   BILITOT 0.2* 04/13/2012 0900   GFRNONAA >90 04/13/2012 0900   GFRAA >90 04/13/2012 0900    Intake: NPO Output:   Intake/Output Summary (Last 24 hours) at 04/13/12 1428 Last data filed at 04/13/12 1300  Gross per 24 hour  Intake     75 ml  Output      0 ml  Net     75 ml   Last BM (7/12)  Diet Order: NPO  Supplements/Tube Feeding: none at this time  IVF:    DISCONTD: albuterol    Estimated Nutritional Needs:   Kcal:  Protein:  Fluid:   RD consulted to see pt for 'underweight.'  Pt reports eating per her usual PTA which she describes as 'what I want, when I want.'  Denies wt change.  Pt states she 'does not care' about her nutrition status.  Pt reports hunger.  Pt becomes upset when she is informed she is NPO due to PRN Bi-pap.  Pt then refuses to discuss nutrition status further.  Pt refuses to allow RD to weigh her via bedscale  stating 'I don't care, I do what I want.' Chart review shows pt lowest known wt to be 35 kg in 2011.  Pt with several missing teeth.  RN reports pt respiratory status has improved since admission, but expects pt to remain NPO through afternoon at least.  RD unable to fully assess current nutrition status. Noted s/s wt change in pt's arms.  RD will continue to monitor and assist pt as able.  NUTRITION DIAGNOSIS: -Increased nutrient needs (NI-3.1).  Status: Ongoing  RELATED TO: increased metabolic demand for lung disease  AS EVIDENCE BY:  Pt 93% ideal body weight, unable to gain wt, COPD  MONITORING/EVALUATION(Goals): 1. Food/Beverage; resume of diet with tolerance once medically appropriate.  EDUCATION NEEDS: -No education needs identified at this time  INTERVENTION: 1.  Modify diet; advancement per MD discretion.  Supplements to be considered once nutritional adequacy can be assessed.  Dietitian #: [phone]  DOCUMENTATION CODES Per approved criteria  -Not Applicable    [facility] 04/13/2012, 2:25 PM

## 2012-04-13 NOTE — ED Provider Notes (Signed)
Medical screening examination/treatment/procedure(s) were conducted as a shared visit with non-physician practitioner(s) and myself.  I personally evaluated the patient during the encounter  Doug Sou, MD 04/13/12 1731

## 2012-04-14 ENCOUNTER — Inpatient Hospital Stay (HOSPITAL_COMMUNITY): Payer: Medicaid Other

## 2012-04-14 ENCOUNTER — Encounter (HOSPITAL_COMMUNITY): Payer: Self-pay | Admitting: Family

## 2012-04-14 DIAGNOSIS — J449 Chronic obstructive pulmonary disease, unspecified: Secondary | ICD-10-CM

## 2012-04-14 DIAGNOSIS — F17200 Nicotine dependence, unspecified, uncomplicated: Secondary | ICD-10-CM

## 2012-04-14 DIAGNOSIS — C3490 Malignant neoplasm of unspecified part of unspecified bronchus or lung: Secondary | ICD-10-CM

## 2012-04-14 DIAGNOSIS — Z923 Personal history of irradiation: Secondary | ICD-10-CM

## 2012-04-14 LAB — BASIC METABOLIC PANEL
CO2: 27 mEq/L (ref 19–32)
Chloride: 104 mEq/L (ref 96–112)
GFR calc Af Amer: >90 mL/min (ref >90)
Potassium: 3.4 mEq/L — ABNORMAL LOW (ref 3.5–5.1)
Sodium: 140 mEq/L (ref 135–145)

## 2012-04-14 LAB — CBC
MCV: 91.4 fL (ref 78.0–100.0)
Platelets: 286 10*3/uL (ref 150–400)
RBC: 3.7 MIL/uL — ABNORMAL LOW (ref 3.87–5.11)
RDW: 16.8 % — ABNORMAL HIGH (ref 11.5–15.5)
WBC: 8.2 10*3/uL (ref 4.0–10.5)

## 2012-04-14 LAB — URINE CULTURE: Colony Count: 80000

## 2012-04-14 MED ORDER — GUAIFENESIN ER 600 MG PO TB12
1200.0000 mg | ORAL_TABLET | Freq: Two times a day (BID) | ORAL | Status: DC
  Administered 2012-04-14 – 2012-04-18 (×9): 1200 mg via ORAL
  Filled 2012-04-14 (×12): qty 2

## 2012-04-14 MED ORDER — PNEUMOCOCCAL VAC POLYVALENT 25 MCG/0.5ML IJ INJ
0.5000 mL | INJECTION | INTRAMUSCULAR | Status: AC
  Administered 2012-04-15: 0.5 mL via INTRAMUSCULAR
  Filled 2012-04-14: qty 0.5

## 2012-04-14 MED ORDER — TRAZODONE HCL 100 MG PO TABS
100.0000 mg | ORAL_TABLET | Freq: Every evening | ORAL | Status: DC | PRN
  Administered 2012-04-14 – 2012-04-17 (×5): 100 mg via ORAL
  Filled 2012-04-14 (×5): qty 1

## 2012-04-14 MED ORDER — POTASSIUM CHLORIDE CRYS ER 20 MEQ PO TBCR
40.0000 meq | EXTENDED_RELEASE_TABLET | ORAL | Status: AC
  Administered 2012-04-14 (×2): 40 meq via ORAL
  Filled 2012-04-14 (×2): qty 2

## 2012-04-14 MED ORDER — LEVALBUTEROL HCL 0.63 MG/3ML IN NEBU
0.6300 mg | INHALATION_SOLUTION | RESPIRATORY_TRACT | Status: DC | PRN
  Filled 2012-04-14: qty 3

## 2012-04-14 MED ORDER — LEVALBUTEROL HCL 0.63 MG/3ML IN NEBU
0.6300 mg | INHALATION_SOLUTION | Freq: Four times a day (QID) | RESPIRATORY_TRACT | Status: DC
  Administered 2012-04-14 – 2012-04-15 (×4): 0.63 mg via RESPIRATORY_TRACT
  Filled 2012-04-14 (×8): qty 3

## 2012-04-14 NOTE — H&P (Signed)
Internal Medicine Attending Admission Note Date: 04/14/2012  Patient name: Karina Weber Medical record number: [id number] Date of birth: Apr 27, 1956 Age: 56 y.o. Gender: female  I saw and evaluated the patient. I reviewed the resident's note and I agree with the resident's findings and plan as documented in the resident's note.  Chief Complaint(s): Shortness of breath for 10 days.  History - key components related to admission:  Karina Weber is a 56 year old woman with a history of home O2 requiring COPD, non-small cell lung cancer status post radiation therapy, continuing tobacco abuse, alcoholism, and hypertension who presents with a ten-day history of progressive shortness of breath. Karina Weber was in her usual state of health until July 1 when she ran out of her inhalers. She states she attempted to call to have them refilled late on July 3. The clinic was closed for the July 4 holiday weekend but she apparently did not try calling to have medications refilled when the clinic re-opened on July 8. Her shortness of breath progressed over the remaining days and was associated with a productive cough of whitish yellow sputum. She has had subjective fevers and chills but denies any chest pain, dizziness, abdominal pain, diarrhea, or recent sick contacts. In the emergency department she was noted to be severely hypoxemic and was therefore admitted to the hospital for further evaluation and care. Once admitted she mentioned to the housestaff that she was suicidal. When I questioned her about it this evening she states that she was just "messin' with them". She currently denies any suicidal or homicidal ideations stating she has much to live for with her grandchildren.  Physical Exam - key components related to admission:  Filed Vitals:   04/14/12 1503 04/14/12 1618 04/14/12 1944 04/14/12 2000  BP:  143/75  148/82  Pulse:  124    Temp:  98.2 F (36.8 C)  98.4 F (36.9 C)  TempSrc:  Oral  Oral  Resp:   24    Height:      Weight:      SpO2: 97% 94% 96% 95%   Gen: Well-developed, thin woman lying comfortably in bed in no acute distress. Lungs: Diffuse expiratory wheezes with occasional rhonchi but no rales. Heart: Tachycardic, regular rhythm, without murmurs, rubs, or gallops. Abdomen: Soft, nontender, active bowel sounds. Extremities: Without edema.  Lab results:  Basic Metabolic Panel:  Basename 04/14/12 0430 04/13/12 0900  NA 140 134*  K 3.4* 4.3  CL 104 92*  CO2 27 29  GLUCOSE 234* 104*  BUN 12 11  CREATININE 0.38* 0.45*  CALCIUM 9.3 9.8  MG -- 1.5  PHOS -- --   Liver Function Tests:  Basename 04/13/12 0900  AST 20  ALT 7  ALKPHOS 166*  BILITOT 0.2*  PROT 7.9  ALBUMIN 3.0*   CBC:  Basename 04/14/12 0430 04/13/12 0900  WBC 8.2 15.5*  NEUTROABS -- 13.1*  HGB 10.5* 12.4  HCT 33.8* 39.0  MCV 91.4 91.5  PLT 286 302   Cardiac Enzymes:  Basename 04/13/12 0900  CKTOTAL --  CKMB --  CKMBINDEX --  TROPONINI <0.30   Urinalysis:  Cloudy, trace hemoglobin, small bilirubin, 15 ketones, 30 protein, large leukocytes, 11-20 white blood cells per high-power field, 0-2 red blood cells per high power field, trichomonas present.  Imaging results:  Dg Chest 2 View  04/14/2012  *RADIOLOGY REPORT*  Clinical Data: Shortness of breath.  CHEST - 2 VIEW  Comparison: 04/13/2012  Findings: Midline trachea.  Borderline cardiomegaly.  No pleural effusion or pneumothorax.  Similar thickening of the right minor fissure.  Lower lobe predominant interstitial thickening is not significantly changed. No lobar consolidation.  IMPRESSION: Similar lower lobe predominant interstitial thickening with thickening of the right minor fissure.  Favor pulmonary venous congestion versus less likely atypical infection.  Original Report Authenticated By: Consuello Bossier, M.D.   Dg Chest Port 1 View  04/13/2012  *RADIOLOGY REPORT*  Clinical Data: Shortness of breath, chest pain  PORTABLE CHEST - 1  VIEW  Comparison: 12/26/2011; 03/14/2011; Chest CT - 12/27/2011; 12/21/2011  Findings: Unchanged enlarged cardiac silhouette and mediastinal contours. Minimal increase in mild diffuse thickening of the pulmonary interstitium most conspicuous within the bilateral mid and lower lungs, right greater than left.  There is minimal thickening along the right minor fissure.  No definite pleural effusion or pneumothorax.  Unchanged bones.  IMPRESSION: Mild thickening of the pulmonary interstitium with the bilateral mid and lower lungs, right greater than left with small amount of fluid tracking in the right minor fissure.  These findings are nonspecific but may be seen in the setting of pulmonary edema, though atypical infection may have a similar appearance.  Clinical correlation is advised.  Further evaluation with a PA and lateral chest radiograph may be obtained as clinically indicated.  Original Report Authenticated By: Waynard Reeds, M.D.   Assessment & Plan by Problem:  [name] is a 56 year old woman with a history of home O2 requiring COPD, non-small cell lung cancer status post radiation therapy, continued tobacco abuse, alcoholism, and hypertension who presents with progressive shortness of breath over an 11 day period since she ran out of her inhalers. This is likely secondary to a COPD exacerbation from a mild bronchitis and mainly from the lack of bronchodilators. She is symptomatically markedly improved with reinitiation of her bronchodilators as well as a steroid burst with taper.  1) COPD exacerbation: Continue the bronchodilators and convert to prednisone 40 mg by mouth daily starting tomorrow morning. I agree with the Levaquin antibiotic for one week to treat her acute bronchitis that is exacerbating her COPD.  2) Suicidal ideations: Karina Weber currently denies any suicidal ideations and claims to have been joking with the housestaff. We will therefore discontinue the sitter. She will be moved to  a general medical ward tomorrow morning if she continues to improve.  3) Trichomonas: She was treated with Flagyl.  4) Other comorbidities: We will continue her outpatient regimen to treat her other comorbidities.  5) Disposition: Ms. Matura will be transitioned to oral medications tomorrow morning. If she continues to do well I anticipate she will be stable for discharge within the next 24-48 hours. She can then followup in the [facility] Medicine Center thereafter.

## 2012-04-14 NOTE — Progress Notes (Signed)
Name: Karina Weber MRN: [id number] DOB: [birth year]    LOS: 1  Referring Provider:  Danise Edge Reason for Referral:  COPD with respiratory failure.  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  Patient responded well to neb treatment but with exertion was in severe respiratory distress.  PCCM called on consultation for active respiratory distress and hypotension.  Past Medical History  Diagnosis Date  . Tobacco abuse   . COPD (chronic obstructive pulmonary disease)     exacerbagtion in 6/12 and in 2010.   Marland Kitchen Hypertension   . History of radiation therapy 07/19/10 to 07/28/10    RUL lung  . 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 Alcohol abuse     2 beers a day   Past Surgical History  Procedure Date  . No past surgeries    Prior to Admission medications   Medication Sig Start Date End Date Taking? Authorizing Provider  albuterol (PROVENTIL) (2.5 MG/3ML) 0.083% nebulizer solution Take 3 mLs (2.5 mg total) by nebulization every 6 (six) hours as needed for wheezing. 12/30/11 12/29/12 Yes Judie Bonus, MD  albuterol (VENTOLIN HFA) 108 (90 BASE) MCG/ACT inhaler Inhale 1 puff into the lungs every 4 (four) hours as needed for wheezing. 12/30/11  Yes Judie Bonus, MD  amLODipine (NORVASC) 10 MG tablet Take 10 mg by mouth daily.   Yes Historical Provider, MD  aspirin 81 MG tablet Take 1 tablet (81 mg total) by mouth daily. 03/15/11  Yes Zollie Beckers, MD  Fluticasone-Salmeterol (ADVAIR) 250-50 MCG/DOSE AEPB Inhale 1 puff into the lungs every 12 (twelve) hours. 12/30/11  Yes Judie Bonus, MD  guaiFENesin (MUCINEX) 600 MG 12 hr tablet Take 1 tablet (600 mg total) by mouth 2 (two) times daily. 12/30/11 12/29/12 Yes Judie Bonus, MD  hydrochlorothiazide (HYDRODIURIL) 12.5 MG tablet Take 12.5 mg by mouth daily.    Yes Historical Provider, MD  ipratropium (ATROVENT) 0.02 % nebulizer solution Take 2.5 mLs (500 mcg total) by nebulization 4 (four) times daily. 12/30/11  Yes Judie Bonus, MD  mirtazapine (REMERON) 15 MG tablet Take 15 mg by mouth at bedtime.   Yes Historical Provider, MD   Allergies Allergies  Allergen Reactions  . Lisinopril Cough     Vital Signs: Temp:  [98 F (36.7 C)-99 F (37.2 C)] 98.3 F (36.8 C) (07/13 0812) Pulse Rate:  [108-135] 108  (07/13 0812) Resp:  [21-29] 29  (07/13 0812) BP: (103-139)/(56-91) 133/70 mmHg (07/13 0812) SpO2:  [85 %-98 %] 94 % (07/13 0819) Weight:  [44.5 kg (98 lb 1.7 oz)] 44.5 kg (98 lb 1.7 oz) (07/13 0500)  Physical Examination: General:  Cachectic AAF in moderate respiratory distress. Neuro:  Alert, oriented and interactive, moves all ext to command. HEENT:  [place]/AT, PERRL, EOM-I, -LAN and DMM. Neck:  Supple, -LAN and -thyromegally. Cardiovascular:  RRR, Nl S1/S2, -M/R/G. Lungs:  Decreased BS in all lung fields. Abdomen:  Soft, NT, ND and +BS. Musculoskeletal:  -edema and -tenderness. Skin:  Intact.  OVERNIGHT: 7/13> breathing better, sl cough w/ thick beige phlegm, no hemoptysis   Principal Problem:  *CAP (community acquired pneumonia) Active Problems:  TOBACCO USE  ESSENTIAL HYPERTENSION  COPD  Acute-on-chronic respiratory failure  Protein calorie malnutrition  Trichomonal infection  Tachycardia   ASSESSMENT AND PLAN:  PULMONARY  Lab 04/13/12 1250  PHART 7.280*  PCO2ART 54.4*  PO2ART 64.1*  HCO3 24.7*  O2SAT 89.8     CXR 7/12>  Hyperinflation with ?RLL airspace disease.  CT Chest 12/21/11> IMPRESSION:  1. Further slight decrease in size of posterior right upper lung  nodule. Other smaller bilateral pulmonary nodular densities  remains stable.  2. Slight increase in right paratracheal mediastinal  lymphadenopathy. No new lymphadenopathy identified.  A:   1) COPD, active smoker, acute on chronic resp  insuffic,R/O superimposed PNA>>  2) Acute respiratory distress that responded to nebs and O2. 3) Non small cell lung cancer, s/p XRT  P:   - Continue O2, titrate for sat of 88-92%. - Agree with steroids, would do solumedrol 80 mg IV q8 hours for now. - Levaquin is an excellent choice of abx in this case (last admission was in march). - Recommend a nebulized steroids at or prior to discharge as I highly doubt patient can generate enough flow to inhale any MDI/HFA with such hyper inflation. - Make BiPAP available as PRN for comfort. - Review ABG. - CXR as discussed with IM resident. - Smoking cessation. - Ongoing discussion of code status, I spoke with patient and she wishes for all interventions, however, if intubated I can see very little chance for her to extubate. - IS and flutter valve to encourage airway clearance. - Early mobilization. - Will need PFT's once acute event is complete and will need f/u with a pulmonary MD. - Would consider an echo and BNP to see if there are any cardiac defects as that would only make matters worse. - PCCM will follow with you.   Michele Mcalpine, M.D. 04/14/2012, 9:34 AM

## 2012-04-14 NOTE — Progress Notes (Addendum)
Subjective: The patient states she is feeling very well today. She complains of mildly productive cough with white/light yellow sputum production. She complains of a short, transient episode of mild chest pain and shortness of breath this AM, but has had no further issues. She denies nausea or vomiting. She denies fevers or chills.  No acute interval events.  Objective: Vital signs in last 24 hours: Filed Vitals:   04/14/12 0812 04/14/12 0815 04/14/12 0819 04/14/12 1241  BP: 133/70   131/95  Pulse: 108   125  Temp: 98.3 F (36.8 C)   98.2 F (36.8 C)  TempSrc: Oral   Oral  Resp: 29   25  Height:      Weight:      SpO2: 92% 94% 94% 94%   Weight change:   Intake/Output Summary (Last 24 hours) at 04/14/12 1433 Last data filed at 04/14/12 1242  Gross per 24 hour  Intake   1685 ml  Output   3200 ml  Net  -1515 ml   Physical exam: General: alert, oriented, no acute distress CV: tachycardic with regular rhythm. No murmurs, rubs, gallops Resp: decreased breath sounds bilaterally, decreased in R>L Abd: Soft, non-tender, non-distended. Normoactive bowel sounds Ext: 2+ pulses. No edema. Psych: Affect currently stable. Patient not currently expressing suicidal/homicidal thoughts.  Lab Results: Basic Metabolic Panel:  Lab 04/14/12 1610 04/13/12 0900  NA 140 134*  K 3.4* 4.3  CL 104 92*  CO2 27 29  GLUCOSE 234* 104*  BUN 12 11  CREATININE 0.38* 0.45*  CALCIUM 9.3 9.8  MG -- 1.5  PHOS -- --   Liver Function Tests:  Lab 04/13/12 0900  AST 20  ALT 7  ALKPHOS 166*  BILITOT 0.2*  PROT 7.9  ALBUMIN 3.0*   CBC:  Lab 04/14/12 0430 04/13/12 0900  WBC 8.2 15.5*  NEUTROABS -- 13.1*  HGB 10.5* 12.4  HCT 33.8* 39.0  MCV 91.4 91.5  PLT 286 302   Cardiac Enzymes:  Lab 04/13/12 0900  CKTOTAL --  CKMB --  CKMBINDEX --  TROPONINI <0.30   Urine Drug Screen: Drugs of Abuse     Component Value Date/Time   LABOPIA NEGATIVE 03/10/2011 0520   LABOPIA NONE DETECTED  11/27/2009 0514   COCAINSCRNUR NEGATIVE 03/10/2011 0520   COCAINSCRNUR NONE DETECTED 11/27/2009 0514   LABBENZ NEGATIVE 03/10/2011 0520   LABBENZ NONE DETECTED 11/27/2009 0514   AMPHETMU NEGATIVE 03/10/2011 0520   AMPHETMU NONE DETECTED 11/27/2009 0514   THCU NONE DETECTED 11/27/2009 0514   LABBARB  Value: NONE DETECTED        DRUG SCREEN FOR MEDICAL PURPOSES ONLY.  IF CONFIRMATION IS NEEDED FOR ANY PURPOSE, NOTIFY LAB WITHIN 5 DAYS.        LOWEST DETECTABLE LIMITS FOR URINE DRUG SCREEN Drug Class       Cutoff (ng/mL) Amphetamine      1000 Barbiturate      200 Benzodiazepine   200 Tricyclics       300 Opiates          300 Cocaine          300 THC              50 11/27/2009 0514    Urinalysis:  Lab 04/13/12 1041  COLORURINE YELLOW  LABSPEC 1.017  PHURINE 5.5  GLUCOSEU NEGATIVE  HGBUR TRACE*  BILIRUBINUR SMALL*  KETONESUR 15*  PROTEINUR 30*  UROBILINOGEN 0.2  NITRITE NEGATIVE  LEUKOCYTESUR LARGE*   Micro Results: Recent Results (  from the past 240 hour(s))  CULTURE, BLOOD (ROUTINE X 2)     Status: Normal (Preliminary result)   Collection Time   04/13/12  9:45 AM      Component Value Range Status Comment   Specimen Description BLOOD RIGHT ARM   Final    Special Requests BOTTLES DRAWN AEROBIC AND ANAEROBIC 10CC   Final    Culture  Setup Time 04/13/2012 17:34   Final    Culture     Final    Value:        BLOOD CULTURE RECEIVED NO GROWTH TO DATE CULTURE WILL BE HELD FOR 5 DAYS BEFORE ISSUING A FINAL NEGATIVE REPORT   Report Status PENDING   Incomplete   CULTURE, BLOOD (ROUTINE X 2)     Status: Normal (Preliminary result)   Collection Time   04/13/12 10:00 AM      Component Value Range Status Comment   Specimen Description BLOOD RIGHT HAND   Final    Special Requests BOTTLES DRAWN AEROBIC ONLY 10CC   Final    Culture  Setup Time 04/13/2012 17:35   Final    Culture     Final    Value:        BLOOD CULTURE RECEIVED NO GROWTH TO DATE CULTURE WILL BE HELD FOR 5 DAYS BEFORE ISSUING A FINAL NEGATIVE  REPORT   Report Status PENDING   Incomplete   URINE CULTURE     Status: Normal   Collection Time   04/13/12 10:41 AM      Component Value Range Status Comment   Specimen Description URINE, CLEAN CATCH   Final    Special Requests NONE   Final    Culture  Setup Time 04/13/2012 11:39   Final    Colony Count 80,000 COLONIES/ML   Final    Culture     Final    Value: Multiple bacterial morphotypes present, none predominant. Suggest appropriate recollection if clinically indicated.   Report Status 04/14/2012 FINAL   Final   MRSA PCR SCREENING     Status: Normal   Collection Time   04/13/12 12:30 PM      Component Value Range Status Comment   MRSA by PCR NEGATIVE  NEGATIVE Final    Studies/Results: Dg Chest 2 View  04/14/2012  *RADIOLOGY REPORT*  Clinical Data: Shortness of breath.  CHEST - 2 VIEW  Comparison: 04/13/2012  Findings: Midline trachea.  Borderline cardiomegaly.     No pleural effusion or pneumothorax.  Similar thickening of the right minor fissure.  Lower lobe predominant interstitial thickening is not significantly changed. No lobar consolidation.  IMPRESSION: Similar lower lobe predominant interstitial thickening with thickening of the right minor fissure.  Favor pulmonary venous congestion versus less likely atypical infection.  Original Report Authenticated By: Consuello Bossier, M.D.   Dg Chest Port 1 View  04/13/2012  *RADIOLOGY REPORT*  Clinical Data: Shortness of breath, chest pain  PORTABLE CHEST - 1 VIEW  Comparison: 12/26/2011; 03/14/2011; Chest CT - 12/27/2011; 12/21/2011  Findings: Unchanged enlarged cardiac silhouette and mediastinal contours. Minimal increase in mild diffuse thickening of the pulmonary interstitium most conspicuous within the bilateral mid and lower lungs, right greater than left.  There is minimal thickening along the right minor fissure.  No definite pleural effusion or pneumothorax.  Unchanged bones.  IMPRESSION: Mild thickening of the pulmonary interstitium  with the bilateral mid and lower lungs, right greater than left with small amount of fluid tracking in the right minor fissure.  These findings are nonspecific but may be seen in the setting of pulmonary edema, though atypical infection may have a similar appearance.  Clinical correlation is advised.  Further evaluation with a PA and lateral chest radiograph may be obtained as clinically indicated.  Original Report Authenticated By: Waynard Reeds, M.D.   Medications: I have reviewed the patient's current medications. Scheduled Meds:   . sodium chloride   Intravenous STAT  . enoxaparin (LOVENOX) injection  40 mg Subcutaneous Q24H  . Fluticasone-Salmeterol  1 puff Inhalation Q12H  . guaiFENesin  1,200 mg Oral BID  . ipratropium  0.5 mg Nebulization Q6H  . levalbuterol  0.63 mg Nebulization Q6H  . levofloxacin (LEVAQUIN) IV  500 mg Intravenous Q24H  . methylPREDNISolone (SOLU-MEDROL) injection  80 mg Intravenous Q8H  . metronidazole  2 g Intravenous Once  . pneumococcal 23 valent vaccine  0.5 mL Intramuscular Tomorrow-1000  . potassium chloride  40 mEq Oral Q4H  . sodium chloride  3 mL Intravenous Q12H  . DISCONTD: albuterol  2.5 mg Nebulization Q6H   Continuous Infusions:  PRN Meds:.acetaminophen, acetaminophen, albuterol, ipratropium, labetalol, ondansetron (ZOFRAN) IV, ondansetron, traZODone Assessment/Plan: Assessment & Plan by Problem:  1. CAP: Ms. Tompson is a 57 year old woman with the PMH listed above who presented with shortness of breath. She has been without her medications for COPD for over a week and has had a week of fevers, chills, and increasing shortness of breath. There is a probable infiltrate on the chest x-ray. This, coupled with her increased WBC, suggest possible CAP. She has not been hospitalized since March of 2013. She was markedly hypoxic on admission but improved with nebulizers, steroids, and supplemental oxygen.  Saturations have maintained in the mid 90s today. We  have switched her albuterol neb to xopenex 2/2 tachycardia. - continue to monitor - continue IV solumedrol; if patient further improves in interim, d/c IV solumedrol and begin oral prednisone - continue IV levaquin 500mg   2. Acute on Chronic respiratory failure: On arrival she was markedly hypoxemic. ABG on admission: 7.280/54.4/64.1/26.4. Her measured Bicarb was 29 on her Bmet. Findings suggestive of acute respiratory acidosis. The patient is saturating well on 3L of O2, and does not currently have an indication for the use of BiPAP. - continue to monitor vitals  3. COPD: Patient had not used COPD meds for 10 days prior to admission. She had significant wheezes on admission, which have resolved as of today's exam.  - continue IV solumedrol - continue xopenex/atrovent nebs q6  4. Tachycardia: Likely 2/2 albuterol neds and IV solumedrol. Albuterol switched to xopenex. - monitor vital signs  4. Trichomonal infection: Ms. Hodkinson had pyuria as well as trichomonas on admission. Treated with flagyl 2g x 1 dose.  5. Tobacco abuse: Holding nicotine replacement 2/2 tachycardia. Patient has received tobacco cessation counseling.   6. Protein Calorie malnutrition and underweight: She is significantly underweight. We will consult nutrition for assistance with supplements.   7. VTE: Lovenox    Johnn Krasowski R 04/14/2012, 2:33 PM

## 2012-04-15 MED ORDER — POTASSIUM CHLORIDE CRYS ER 20 MEQ PO TBCR
40.0000 meq | EXTENDED_RELEASE_TABLET | Freq: Once | ORAL | Status: AC
  Administered 2012-04-15: 40 meq via ORAL
  Filled 2012-04-15: qty 2

## 2012-04-15 MED ORDER — LEVOFLOXACIN 500 MG PO TABS
500.0000 mg | ORAL_TABLET | Freq: Every day | ORAL | Status: DC
  Administered 2012-04-15 – 2012-04-17 (×3): 500 mg via ORAL
  Filled 2012-04-15 (×4): qty 1

## 2012-04-15 MED ORDER — PREDNISONE 50 MG PO TABS
60.0000 mg | ORAL_TABLET | Freq: Every day | ORAL | Status: DC
  Administered 2012-04-16 – 2012-04-18 (×3): 60 mg via ORAL
  Filled 2012-04-15 (×4): qty 1

## 2012-04-15 NOTE — Progress Notes (Signed)
Resident Addendum to Medical Student Note   I have seen and examined the patient, and reviewed the daily progress note by Eather Colas, MS IV and discussed the care of the patient with them. See below for documentation of my findings, assessment, and plans.   Subjective:    Currently, the patient is feeling well without acute complaints, states she has been eating/ drinking/ voiding / stooling well. States her breathing is improved, and she has remained without chest pain, palpitations.  Interval Events: Patient experiencing sinus tachycardia with rates to 140s with standing, ambulating, nonsustained. Asymptomatic.   Objective:    VS: Reviewed as documented in electronic medical record  Meds: Reviewed as documented in electronic medical record  Labs: Reviewed as documented in electronic medical record  Imaging: Reviewed as documented in electronic medical record   Physical Exam: General: Vital signs reviewed and noted. Well-developed, well-nourished, in no acute distress; alert, appropriate and cooperative throughout examination.  Lungs:  Normal respiratory effort. Clear to auscultation BL without crackles or wheezes.  Heart: Tachycardic, regular rhythm. S1 and S2 normal without gallop, murmur, or rubs.  Abdomen:  BS normoactive. Soft, Nondistended, non-tender.  No masses or organomegaly.  Extremities: No pretibial edema.    Assessment/ Plan:    Pt is a 56 y.o. 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 She was found to have a COPD exacerbation, likely in the setting of medication noncompliance compounded atop what seems to be a community acquired PNA.  1) CAP - admission CXR showing probable infiltrate, which in  addition to her preceding week of fevers, chills, shortness of breath, is most consistent with community acquired pneumonia. Symptoms are improved at this time, and she is currently on day 3/7 of Levaquin. - Change to oral levaquin continue for a total of 7 days.  2) COPD exacerbation - secondary to #1 and noncompliance with inhalers (ran out, no refills) x 10 days prior to admission. Symptoms stabilizing - Change steroids to oral steroid burst x 2 weeks total - Continue home 02 requirement, 2l by nasal cannula as inpatient  - Change xopenex/atrovent to PRN.  - Respiratory therapy to educate patient on use of MDI, appropriate inhaler technique.  - PFT's as outpatient after acute pulmonary issues stabilize.  3) Acute on chronic respiratory failure secondary to #1 and 2 - Acute component resolved. On admission, pt notably hypoxemic with ABG showing pH 7.280/54.4/64.1/26.4BP .  - Treatment as above. - Appreciate PCCM assistance and input in the management of our patient.   4) Sinus tachycardia - likely multifactorial secondary to bronchodilators (albuterol/ xoponex), general deconditioning, possible mild volume depletion (in setting of acute illness, increased work of breathing, leading to increased insensible losses). However, there is no indication of orthostasis at present, therefore, not likely significant volume depletion.  - Xoponex only PRN - Consider mild fluid resuscitation if change in inhalers alone does not stabilize symptoms.   5) Hypertension- just starting to stabilize off of her home regimen.  - Will continue to monitor. - Consider to restart home medications soon.   6) Trichomonal infection -Treated with flagyl 2g x 1 dose.   7) Protein Calorie malnutrition and underweight -Likely secondary to her cancer in addition to reduced oral intake due to  alcohol abuse. Body mass index is 17.34 kg/(m^2).   - Continue regular diet per Nutrition.   Length of Stay: 2   Signed: Johnette Abraham, Erline Levine, Internal Medicine Resident Pager: [phone] (7AM-5PM) 04/15/2012, 6:05 PM

## 2012-04-15 NOTE — Progress Notes (Signed)
Patient ID: Karina Weber, female   DOB: April 30, 1956, 56 y.o.   MRN: [id number] Medical Student Daily Progress Note  Subjective: Patient reports 1 minute episode of chest discomfort which she characterized as tightness. She said initially that it was 7/10 pain but after a short time it was 2/10. She was able to go back to sleep. No radiation, diaphoresis, or heart palpitations. The patient did not report this incident to the nurse.  Interval Events: No events overnight. Objective: Vital signs in last 24 hours: Filed Vitals:   04/15/12 0119 04/15/12 0448 04/15/12 0757 04/15/12 0758  BP:  152/85 144/95   Pulse:  80 105   Temp:  98.4 F (36.9 C) 97.8 F (36.6 C)   TempSrc:  Oral Oral   Resp:  24 18   Height:      Weight:  44.4 kg (97 lb 14.2 oz)    SpO2: 96% 95% 94% 96%   Weight change: -0.1 kg (-3.5 oz)  Intake/Output Summary (Last 24 hours) at 04/15/12 0858 Last data filed at 04/15/12 0800  Gross per 24 hour  Intake   1660 ml  Output   2300 ml  Net   -640 ml   Physical Exam: General: alert, oriented, no acute distress  CV: tachycardic with regular rhythm. No murmurs, rubs, gallops  Resp: decreased breath sounds bilaterally, inspiration with pursed lips  Abd: Soft, non-tender, non-distended. Normoactive bowel sounds  Ext: 2+ pulses. No edema.  Psych: Affect currently stable. Patient not currently expressing suicidal/homicidal thoughts.  Lab Results: Basic Metabolic Panel:  Lab 04/14/12 4098 04/13/12 0900  NA 140 134*  K 3.4* 4.3  CL 104 92*  CO2 27 29  GLUCOSE 234* 104*  BUN 12 11  CREATININE 0.38* 0.45*  CALCIUM 9.3 9.8  MG -- 1.5  PHOS -- --   Liver Function Tests:  Lab 04/13/12 0900  AST 20  ALT 7  ALKPHOS 166*  BILITOT 0.2*  PROT 7.9  ALBUMIN 3.0*   No results found for this basename: LIPASE:2,AMYLASE:2 in the last 168 hours No results found for this basename: AMMONIA:2 in the last 168 hours CBC:  Lab 04/14/12 0430 04/13/12 0900  WBC 8.2 15.5*    NEUTROABS -- 13.1*  HGB 10.5* 12.4  HCT 33.8* 39.0  MCV 91.4 91.5  PLT 286 302   Cardiac Enzymes:  Lab 04/13/12 0900  CKTOTAL --  CKMB --  CKMBINDEX --  TROPONINI <0.30   BNP: No results found for this basename: PROBNP:3 in the last 168 hours D-Dimer: No results found for this basename: DDIMER:2 in the last 168 hours CBG: No results found for this basename: GLUCAP:6 in the last 168 hours Hemoglobin A1C: No results found for this basename: HGBA1C in the last 168 hours Fasting Lipid Panel: No results found for this basename: CHOL,HDL,LDLCALC,TRIG,CHOLHDL,LDLDIRECT in the last 119 hours Thyroid Function Tests: No results found for this basename: TSH,T4TOTAL,FREET4,T3FREE,THYROIDAB in the last 168 hours Coagulation: No results found for this basename: LABPROT:4,INR:4 in the last 168 hours Anemia Panel: No results found for this basename: VITAMINB12,FOLATE,FERRITIN,TIBC,IRON,RETICCTPCT in the last 168 hours Urine Drug Screen: Drugs of Abuse     Component Value Date/Time   LABOPIA NEGATIVE 03/10/2011 0520   LABOPIA NONE DETECTED 11/27/2009 0514   COCAINSCRNUR NEGATIVE 03/10/2011 0520   COCAINSCRNUR NONE DETECTED 11/27/2009 0514   LABBENZ NEGATIVE 03/10/2011 0520   LABBENZ NONE DETECTED 11/27/2009 0514   AMPHETMU NEGATIVE 03/10/2011 0520   AMPHETMU NONE DETECTED 11/27/2009 0514   THCU NONE  DETECTED 11/27/2009 0514   LABBARB  Value: NONE DETECTED        DRUG SCREEN FOR MEDICAL PURPOSES ONLY.  IF CONFIRMATION IS NEEDED FOR ANY PURPOSE, NOTIFY LAB WITHIN 5 DAYS.        LOWEST DETECTABLE LIMITS FOR URINE DRUG SCREEN Drug Class       Cutoff (ng/mL) Amphetamine      1000 Barbiturate      200 Benzodiazepine   200 Tricyclics       300 Opiates          300 Cocaine          300 THC              50 11/27/2009 0514    Alcohol Level: No results found for this basename: ETH:2 in the last 168 hours Urinalysis:  Lab 04/13/12 1041  COLORURINE YELLOW  LABSPEC 1.017  PHURINE 5.5  GLUCOSEU NEGATIVE   HGBUR TRACE*  BILIRUBINUR SMALL*  KETONESUR 15*  PROTEINUR 30*  UROBILINOGEN 0.2  NITRITE NEGATIVE  LEUKOCYTESUR LARGE*   Misc. Labs:   Micro Results: Recent Results (from the past 240 hour(s))  CULTURE, BLOOD (ROUTINE X 2)     Status: Normal (Preliminary result)   Collection Time   04/13/12  9:45 AM      Component Value Range Status Comment   Specimen Description BLOOD RIGHT ARM   Final    Special Requests BOTTLES DRAWN AEROBIC AND ANAEROBIC 10CC   Final    Culture  Setup Time 04/13/2012 17:34   Final    Culture     Final    Value:        BLOOD CULTURE RECEIVED NO GROWTH TO DATE CULTURE WILL BE HELD FOR 5 DAYS BEFORE ISSUING A FINAL NEGATIVE REPORT   Report Status PENDING   Incomplete   CULTURE, BLOOD (ROUTINE X 2)     Status: Normal (Preliminary result)   Collection Time   04/13/12 10:00 AM      Component Value Range Status Comment   Specimen Description BLOOD RIGHT HAND   Final    Special Requests BOTTLES DRAWN AEROBIC ONLY 10CC   Final    Culture  Setup Time 04/13/2012 17:35   Final    Culture     Final    Value:        BLOOD CULTURE RECEIVED NO GROWTH TO DATE CULTURE WILL BE HELD FOR 5 DAYS BEFORE ISSUING A FINAL NEGATIVE REPORT   Report Status PENDING   Incomplete   URINE CULTURE     Status: Normal   Collection Time   04/13/12 10:41 AM      Component Value Range Status Comment   Specimen Description URINE, CLEAN CATCH   Final    Special Requests NONE   Final    Culture  Setup Time 04/13/2012 11:39   Final    Colony Count 80,000 COLONIES/ML   Final    Culture     Final    Value: Multiple bacterial morphotypes present, none predominant. Suggest appropriate recollection if clinically indicated.   Report Status 04/14/2012 FINAL   Final   MRSA PCR SCREENING     Status: Normal   Collection Time   04/13/12 12:30 PM      Component Value Range Status Comment   MRSA by PCR NEGATIVE  NEGATIVE Final    Studies/Results: Dg Chest 2 View  04/14/2012  *RADIOLOGY REPORT*   Clinical Data: Shortness of breath.  CHEST - 2 VIEW  Comparison: 04/13/2012  Findings: Midline trachea.  Borderline cardiomegaly.     No pleural effusion or pneumothorax.  Similar thickening of the right minor fissure.  Lower lobe predominant interstitial thickening is not significantly changed. No lobar consolidation.  IMPRESSION: Similar lower lobe predominant interstitial thickening with thickening of the right minor fissure.  Favor pulmonary venous congestion versus less likely atypical infection.  Original Report Authenticated By: Consuello Bossier, M.D.   Dg Chest Port 1 View  04/13/2012  *RADIOLOGY REPORT*  Clinical Data: Shortness of breath, chest pain  PORTABLE CHEST - 1 VIEW  Comparison: 12/26/2011; 03/14/2011; Chest CT - 12/27/2011; 12/21/2011  Findings: Unchanged enlarged cardiac silhouette and mediastinal contours. Minimal increase in mild diffuse thickening of the pulmonary interstitium most conspicuous within the bilateral mid and lower lungs, right greater than left.  There is minimal thickening along the right minor fissure.  No definite pleural effusion or pneumothorax.  Unchanged bones.  IMPRESSION: Mild thickening of the pulmonary interstitium with the bilateral mid and lower lungs, right greater than left with small amount of fluid tracking in the right minor fissure.  These findings are nonspecific but may be seen in the setting of pulmonary edema, though atypical infection may have a similar appearance.  Clinical correlation is advised.  Further evaluation with a PA and lateral chest radiograph may be obtained as clinically indicated.  Original Report Authenticated By: Waynard Reeds, M.D.   Medications: I have reviewed the patient's current medications. Scheduled Meds:   . enoxaparin (LOVENOX) injection  40 mg Subcutaneous Q24H  . Fluticasone-Salmeterol  1 puff Inhalation Q12H  . guaiFENesin  1,200 mg Oral BID  . ipratropium  0.5 mg Nebulization Q6H  . levalbuterol  0.63 mg  Nebulization Q6H  . levofloxacin (LEVAQUIN) IV  500 mg Intravenous Q24H  . methylPREDNISolone (SOLU-MEDROL) injection  80 mg Intravenous Q8H  . pneumococcal 23 valent vaccine  0.5 mL Intramuscular Tomorrow-1000  . potassium chloride  40 mEq Oral Q4H  . potassium chloride  40 mEq Oral Once  . sodium chloride  3 mL Intravenous Q12H  . DISCONTD: albuterol  2.5 mg Nebulization Q6H   Continuous Infusions:  PRN Meds:.acetaminophen, acetaminophen, ipratropium, labetalol, levalbuterol, ondansetron (ZOFRAN) IV, ondansetron, traZODone, DISCONTD: albuterol Assessment/Plan:  1. CAP: Karina Weber is a 56 year old woman with the PMH listed above who presented with shortness of breath. She has been without her medications for COPD for over a week and has had a week of fevers, chills, and increasing shortness of breath. There is a probable infiltrate on the chest x-ray. This, coupled with her increased WBC, suggest possible CAP. She has not been hospitalized since March of 2013. She was markedly hypoxic on admission but improved with nebulizers, steroids, and supplemental oxygen. Saturations have maintained in the mid 90s today. We have switched her albuterol neb to xopenex 2/2 tachycardia. Her white count has normalized.  - continue to monitor  - begin oral prednisone  - dc IV levaquin 500mg  and begin PO levaquin 500 mg   2. Acute on Chronic respiratory failure: On arrival she was markedly hypoxemic. ABG on admission: 7.280/54.4/64.1/26.4. Her measured Bicarb was 29 on her Bmet. Findings suggestive of acute respiratory acidosis. The patient is saturating well on 2L of O2, and does has  BiPAP PRN.  - continue to monitor vitals   3. COPD: Patient had not used COPD meds for 10 days prior to admission. She had significant wheezes on admission, which have resolved as of today's  exam.  - begin oral prednisone - home 02 requirement, 2l by nasal cannula as inpatient - continue xopenex/atrovent prn - Respiratory  therapy to educate patient on use of MDI - PFT's as outpatient  4. Chest Discomfort- No radiation or diaphoresis. Patients describes it more as discomfort than pain. Differential includes ACS, GERD, and COPD complication. Likely secondary to severe COPD.    5. Tachycardia: Likely 2/2 albuterol neds and IV solumedrol. Albuterol switched to xopenex. Xopenex nebs prn. -Xopenex nebs prn  - monitor vital signs  6. Hypertension- Restart home doses of HCTZ and Amlodipine   7. Trichomonal infection: Karina Weber had pyuria as well as trichomonas on admission. Treated with flagyl 2g x 1 dose.   8. Tobacco abuse: Holding nicotine replacement 2/2 tachycardia. Patient has received tobacco cessation counseling.   9. Protein Calorie malnutrition and underweight: She is significantly underweight. Nutrition has been consulted and appreciate recs. -On regular diet   10. DVT prophy: Lovenox  11. Dispo: From step-down to floor    LOS: 2 days   This is a Psychologist, occupational Note.  The care of the patient was discussed with Dr. Saralyn Pilar and the assessment and plan formulated with their assistance.  Please see their attached note for official documentation of the daily encounter.  Eather Colas T 04/15/2012, 8:58 AM

## 2012-04-15 NOTE — Progress Notes (Signed)
Report called to allana rn on 6700 pt vss no c/o's personal belongings and meds transferred with pt. [facility] [employer]

## 2012-04-16 LAB — CBC
HCT: 35.9 % — ABNORMAL LOW (ref 36.0–46.0)
Hemoglobin: 11.3 g/dL — ABNORMAL LOW (ref 12.0–15.0)
MCV: 92.8 fL (ref 78.0–100.0)
RBC: 3.87 MIL/uL (ref 3.87–5.11)
RDW: 17 % — ABNORMAL HIGH (ref 11.5–15.5)
WBC: 10.8 10*3/uL — ABNORMAL HIGH (ref 4.0–10.5)

## 2012-04-16 LAB — BASIC METABOLIC PANEL
BUN: 19 mg/dL (ref 6–23)
CO2: 36 mEq/L — ABNORMAL HIGH (ref 19–32)
Chloride: 96 mEq/L (ref 96–112)
Creatinine, Ser: 0.44 mg/dL — ABNORMAL LOW (ref 0.50–1.10)
GFR calc Af Amer: >90 mL/min (ref >90)
Glucose, Bld: 93 mg/dL (ref 70–99)
Potassium: 4.3 mEq/L (ref 3.5–5.1)

## 2012-04-16 MED ORDER — AMLODIPINE BESYLATE 10 MG PO TABS
10.0000 mg | ORAL_TABLET | Freq: Every day | ORAL | Status: DC
  Administered 2012-04-16 – 2012-04-17 (×2): 10 mg via ORAL
  Filled 2012-04-16 (×2): qty 1

## 2012-04-16 MED ORDER — ENOXAPARIN SODIUM 30 MG/0.3ML ~~LOC~~ SOLN
30.0000 mg | SUBCUTANEOUS | Status: DC
  Administered 2012-04-16 – 2012-04-17 (×2): 30 mg via SUBCUTANEOUS
  Filled 2012-04-16 (×4): qty 0.3

## 2012-04-16 NOTE — Progress Notes (Signed)
According to telemetry, pt's heart rate has been in the 120's, sustaining for the last hour. Pt is asymptomatic. She has been resting comfortably in a chair looking out the window. She did receive 10mg  Amlodipine PO at 10 am. MD notified.

## 2012-04-16 NOTE — Progress Notes (Addendum)
Patient ID: Karina Weber, female   DOB: [birth year], 56 y.o.   MRN: [id number] Medical Student Daily Progress Note  Subjective: Patient reports that she had trouble sleeping last night. She says that she felt hot and cold all night. She said that she had some chest pain and palpitations that was associated with her breathing. She also says that she has foggy vision but she admits to needing new glasses. On questioning about blood per rectum, patient said it happened two days ago and it was about a quarter in size on the toilet paper. Asked if she felt back to baseline she said yes and that she feels ready to go.  Intervals events: Nurse reported patient had bright red blood on tissue paper.  Objective: Vital signs in last 24 hours: Filed Vitals:   04/15/12 2006 04/15/12 2024 04/16/12 0503 04/16/12 0943  BP:  122/79 145/85   Pulse:  115 116   Temp:  98.4 F (36.9 C) 98.3 F (36.8 C)   TempSrc:  Oral Oral   Resp:  20 18   Height:      Weight:  43 kg (94 lb 12.8 oz)    SpO2: 94% 94% 95% 92%   Weight change: -1.4 kg (-3 lb 1.4 oz)  Intake/Output Summary (Last 24 hours) at 04/16/12 1100 Last data filed at 04/15/12 2200  Gross per 24 hour  Intake    480 ml  Output    200 ml  Net    280 ml   Physical Exam: BP 145/85  Pulse 116  Temp 98.3 F (36.8 C) (Oral)  Resp 18  Ht 5\' 2"  (1.575 m)  Wt 43 kg (94 lb 12.8 oz)  BMI 17.34 kg/m2  SpO2 92%  General Appearance:    Alert, cooperative, no distress, appears malnourished  Head:    Normocephalic, without obvious abnormality, atraumatic  Back:     Symmetric, no curvature, ROM normal, no CVA tenderness  Lungs:     Clear to auscultation bilaterally  Chest Wall:    No tenderness or deformity   Heart:   Increased rate, S1 and S2 normal  Abdomen:     Soft, non-tender, no masses, no organomegaly   Lab Results: Basic Metabolic Panel:  Lab 04/16/12 7829 04/14/12 0430 04/13/12 0900  NA 139 140 --  K 4.3 3.4* --  CL 96 104 --  CO2 36*  27 --  GLUCOSE 93 234* --  BUN 19 12 --  CREATININE 0.44* 0.38* --  CALCIUM 9.7 9.3 --  MG -- -- 1.5  PHOS -- -- --   Liver Function Tests:  Lab 04/13/12 0900  AST 20  ALT 7  ALKPHOS 166*  BILITOT 0.2*  PROT 7.9  ALBUMIN 3.0*   No results found for this basename: LIPASE:2,AMYLASE:2 in the last 168 hours No results found for this basename: AMMONIA:2 in the last 168 hours CBC:  Lab 04/16/12 0510 04/14/12 0430 04/13/12 0900  WBC 10.8* 8.2 --  NEUTROABS -- -- 13.1*  HGB 11.3* 10.5* --  HCT 35.9* 33.8* --  MCV 92.8 91.4 --  PLT 392 286 --   Cardiac Enzymes:  Lab 04/13/12 0900  CKTOTAL --  CKMB --  CKMBINDEX --  TROPONINI <0.30   BNP: No results found for this basename: PROBNP:3 in the last 168 hours D-Dimer: No results found for this basename: DDIMER:2 in the last 168 hours CBG: No results found for this basename: GLUCAP:6 in the last 168 hours Hemoglobin A1C: No results  found for this basename: HGBA1C in the last 168 hours Fasting Lipid Panel: No results found for this basename: CHOL,HDL,LDLCALC,TRIG,CHOLHDL,LDLDIRECT in the last 960 hours Thyroid Function Tests: No results found for this basename: TSH,T4TOTAL,FREET4,T3FREE,THYROIDAB in the last 168 hours Coagulation: No results found for this basename: LABPROT:4,INR:4 in the last 168 hours Anemia Panel: No results found for this basename: VITAMINB12,FOLATE,FERRITIN,TIBC,IRON,RETICCTPCT in the last 168 hours Urine Drug Screen: Drugs of Abuse     Component Value Date/Time   LABOPIA NEGATIVE 03/10/2011 0520   LABOPIA NONE DETECTED 11/27/2009 0514   COCAINSCRNUR NEGATIVE 03/10/2011 0520   COCAINSCRNUR NONE DETECTED 11/27/2009 0514   LABBENZ NEGATIVE 03/10/2011 0520   LABBENZ NONE DETECTED 11/27/2009 0514   AMPHETMU NEGATIVE 03/10/2011 0520   AMPHETMU NONE DETECTED 11/27/2009 0514   THCU NONE DETECTED 11/27/2009 0514   LABBARB  Value: NONE DETECTED        DRUG SCREEN FOR MEDICAL PURPOSES ONLY.  IF CONFIRMATION IS NEEDED  FOR ANY PURPOSE, NOTIFY LAB WITHIN 5 DAYS.        LOWEST DETECTABLE LIMITS FOR URINE DRUG SCREEN Drug Class       Cutoff (ng/mL) Amphetamine      1000 Barbiturate      200 Benzodiazepine   200 Tricyclics       300 Opiates          300 Cocaine          300 THC              50 11/27/2009 0514    Alcohol Level: No results found for this basename: ETH:2 in the last 168 hours Urinalysis:  Lab 04/13/12 1041  COLORURINE YELLOW  LABSPEC 1.017  PHURINE 5.5  GLUCOSEU NEGATIVE  HGBUR TRACE*  BILIRUBINUR SMALL*  KETONESUR 15*  PROTEINUR 30*  UROBILINOGEN 0.2  NITRITE NEGATIVE  LEUKOCYTESUR LARGE*   Misc. Labs:  Micro Results: Recent Results (from the past 240 hour(s))  CULTURE, BLOOD (ROUTINE X 2)     Status: Normal (Preliminary result)   Collection Time   04/13/12  9:45 AM      Component Value Range Status Comment   Specimen Description BLOOD RIGHT ARM   Final    Special Requests BOTTLES DRAWN AEROBIC AND ANAEROBIC 10CC   Final    Culture  Setup Time 04/13/2012 17:34   Final    Culture     Final    Value:        BLOOD CULTURE RECEIVED NO GROWTH TO DATE CULTURE WILL BE HELD FOR 5 DAYS BEFORE ISSUING A FINAL NEGATIVE REPORT   Report Status PENDING   Incomplete   CULTURE, BLOOD (ROUTINE X 2)     Status: Normal (Preliminary result)   Collection Time   04/13/12 10:00 AM      Component Value Range Status Comment   Specimen Description BLOOD RIGHT HAND   Final    Special Requests BOTTLES DRAWN AEROBIC ONLY 10CC   Final    Culture  Setup Time 04/13/2012 17:35   Final    Culture     Final    Value:        BLOOD CULTURE RECEIVED NO GROWTH TO DATE CULTURE WILL BE HELD FOR 5 DAYS BEFORE ISSUING A FINAL NEGATIVE REPORT   Report Status PENDING   Incomplete   URINE CULTURE     Status: Normal   Collection Time   04/13/12 10:41 AM      Component Value Range Status Comment   Specimen Description URINE,  CLEAN CATCH   Final    Special Requests NONE   Final    Culture  Setup Time 04/13/2012 11:39    Final    Colony Count 80,000 COLONIES/ML   Final    Culture     Final    Value: Multiple bacterial morphotypes present, none predominant. Suggest appropriate recollection if clinically indicated.   Report Status 04/14/2012 FINAL   Final   MRSA PCR SCREENING     Status: Normal   Collection Time   04/13/12 12:30 PM      Component Value Range Status Comment   MRSA by PCR NEGATIVE  NEGATIVE Final    Studies/Results: No results found. Medications: I have reviewed the patient's current medications. Scheduled Meds:   . amLODipine  10 mg Oral Daily  . enoxaparin (LOVENOX) injection  40 mg Subcutaneous Q24H  . Fluticasone-Salmeterol  1 puff Inhalation Q12H  . guaiFENesin  1,200 mg Oral BID  . levofloxacin  500 mg Oral q1800  . predniSONE  60 mg Oral Q breakfast  . sodium chloride  3 mL Intravenous Q12H  . DISCONTD: ipratropium  0.5 mg Nebulization Q6H  . DISCONTD: levalbuterol  0.63 mg Nebulization Q6H  . DISCONTD: levofloxacin (LEVAQUIN) IV  500 mg Intravenous Q24H  . DISCONTD: methylPREDNISolone (SOLU-MEDROL) injection  80 mg Intravenous Q8H   Continuous Infusions:  PRN Meds:.acetaminophen, acetaminophen, ipratropium, levalbuterol, ondansetron (ZOFRAN) IV, ondansetron, traZODone, DISCONTD: labetalol Assessment/Plan: Ms. Amaral is a 56 y/o woman with a PMH of severe COPD, HTN, non-small cell lung cancer s/p radiation therapy, alcoholism, and tobacco use who presented with a COPD exacerbation 2/2 medicine noncompliance and possible pneumonia.  1. CAP: There is a probable infiltrate on the chest x-ray. This, coupled with her increased WBC on admission, suggest possible CAP. She has not been hospitalized since March of 2013. She was markedly hypoxic on admission but improved with nebulizers, steroids, and supplemental oxygen. Saturations have maintained in the 90s today. We have switched her albuterol neb to xopenex prn 2/2 tachycardia. Her white count has normalized.  - continue to monitor  -   oral prednisone 60 mg -  PO levaquin 500 mg   2. Acute on Chronic respiratory failure: On arrival she was markedly hypoxemic. ABG on admission: 7.280/54.4/64.1/26.4. Her measured Bicarb was 29 on her Bmet. Findings suggestive of acute respiratory acidosis. The patient is saturating well on 2L of O2, and does have BiPAP PRN.  - continue to monitor vitals   3. COPD exacerbation 2/2 medication noncompliance: Patient had not used COPD meds for 10 days prior to admission. She had significant wheezes on admission, which have since resolved.  - oral prednisone 60 mg with taper for two weeks.  - home 02 requirement, 2l by nasal cannula as inpatient  - continue xopenex/atrovent prn  - Respiratory therapy to educate patient on use of MDI  - PFT's as outpatient   4. Chest Discomfort- No radiation or diaphoresis. Patients describes it more as discomfort than pain. Differential includes ACS, GERD, and COPD complication. Likely secondary to severe COPD.   5. Tachycardia: Patient has asymptomatic sinus tachycardia that ranges from low 100's to the 130's. Initially thought it was due to the albuterol nebs so the nebs were switched to xopenex and then made PRN. Continues to have tachycardia in light of patient being symptomatically better and with 02 sats of 95% with 2 L of 02. Likely physiologic.  - monitor vital signs - Likely physiologic - Consider BNP   6.  Hypertension- Restart home doses of HCTZ and Amlodipine.   7. Trichomonal infection: Ms. Klingensmith had pyuria as well as trichomonas on admission. Treated with flagyl 2g x 1 dose.   8. Tobacco abuse: Holding nicotine replacement 2/2 tachycardia. Patient has received tobacco cessation counseling.   9. Protein Calorie malnutrition and underweight: She is significantly underweight. Nutrition has been consulted and appreciate recs.  -On regular diet   10. Blood per rectum- Small amount of blood on toilet paper. Hgb stable. Will get FOBT and can follow up  as an outpatient with GI.  11. DVT prophy: Lovenox   12. Dispo: Patient was open to speaking with case worker about living situation and also for the necessary equipment for her home 02. Possibly home today.   LOS: 3 days   This is a Psychologist, occupational Note.  The care of the patient was discussed with Dr. Saralyn Pilar and the assessment and plan formulated with their assistance.  Please see their attached note for official documentation of the daily encounter.  Eather Colas T 04/16/2012, 11:00 AM

## 2012-04-16 NOTE — Progress Notes (Addendum)
RESIDENT ADDENDUM TO MEDICAL STUDENT NOTE    I have seen and examined the patient, and reviewed the daily progress note by Eather Colas, MS IV and discussed the care of the patient with them. Please see my progress note from 04/16/2012 for further details regarding assessment and plan.    Signed:  Johnette Abraham, Roma Schanz, Internal Medicine Resident  Pager: [phone] (7AM-5PM)  04/06/2012, 10:42 AM

## 2012-04-16 NOTE — Progress Notes (Addendum)
Resident Addendum to Medical Student Note   I have seen and examined the patient, and reviewed the daily progress note by Eather Colas, MS IV and discussed the care of the patient with them. See below for documentation of my findings, assessment, and plans.   Subjective:    Currently, the patient is feeling well without acute complaints, states she has been eating/ drinking/ voiding / stooling well. States her breathing is improved even from yesterday, no fevers, chills.  Interval Events: None.   Objective:    VS: Reviewed as documented in electronic medical record  Meds: Reviewed as documented in electronic medical record  Labs: Reviewed as documented in electronic medical record  Imaging: Reviewed as documented in electronic medical record   Physical Exam: General: Vital signs reviewed and noted. Well-developed, well-nourished, in no acute distress; alert, appropriate and cooperative throughout examination.  Lungs:  Normal respiratory effort. Clear to auscultation without wheezes. Mild bibasilar crackles appreciated.  Heart: Tachycardic, regular rhythm. S1 and S2 normal without gallop, murmur, or rubs.  Abdomen:  BS normoactive. Soft, Nondistended, non-tender.  No masses or organomegaly.  Extremities: No pretibial edema.    Assessment/ Plan:    Pt is a 56 y.o. 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 She was found to have a COPD exacerbation, likely in the setting of medication noncompliance compounded atop what seems to be a community acquired PNA.  1) CAP - admission CXR showing probable infiltrate, which in addition to her preceding week of fevers, chills, shortness of breath, is most consistent with community  acquired pneumonia. Symptoms are improved at this time, and she is currently on day 4/7 of Levaquin. - Change to oral levaquin continue for a total of 10 days.  2) COPD exacerbation - secondary to #1 and noncompliance with inhalers (ran out, no refills) x 10 days prior to admission. Symptoms stabilizing - Change steroids to oral steroid burst x 2 weeks total - Continue home 02 requirement, 2L by nasal cannula as inpatient  - Continue PRN xopenex/atrovent. - CM consult to help with getting new O2 machine as hers is nonfunctional at home. - Continue respiratory therapy to educate patient on use of MDI, appropriate inhaler technique.  - PFT's as outpatient after acute pulmonary issues stabilize.  3) Acute on chronic respiratory failure secondary to #1 and 2 - Acute component resolved. On admission, pt notably hypoxemic with ABG showing pH 7.280/54.4/64.1/26.4BP .  - Treatment as above. - Appreciate PCCM assistance and input in the management of our patient.   4) Sinus tachycardia - likely multifactorial secondary to bronchodilators (albuterol/ xoponex), prednisone, general deconditioning. No indication of volume overload at this time. - Asymptomatic, continue to monitor as outpt as prednisone weaned.   5) Hypertension- mildly elevated this.  - Slowly re-add her home medications - will first add Amlodipine, monitor, then consider HCTZ as an outpatient (given low oral intake, poor nutritional status, I think it will be easy for her to become volume deplete.)  6) Trichomonal infection -Treated with flagyl 2g x 1 dose.   7) Protein Calorie malnutrition and underweight - Likely secondary to her cancer in addition to reduced oral intake due to alcohol abuse. Body mass index is 17.34 kg/(m^2).   - Continue  regular diet per Nutrition.  8) Blood in stool - patient notes that she had small amount of dark blood in stool yesterday, single episode that has not returned since that time. hgb at baseline. -  Will check FOBT.  - Will continue to monitor CBC. - Will likely need outpatient gastroenterology referral (does not seem to have prior colonoscopy in our system).  9) Dispo - home soon if symptoms stabilize, home O2 arranged, and PT evaluates the patient soon regarding home health versus other needs.   Length of Stay: 3   Signed: Johnette Abraham, Erline Levine, Internal Medicine Resident Pager: [phone] (7AM-5PM) 04/16/2012, 12:40 PM

## 2012-04-16 NOTE — Progress Notes (Signed)
INTERNAL MEDICINE TEACHING SERVICE Attending Note  Date: 04/16/2012  Patient name: Karina Weber  Medical record number: [id number]  Date of birth: 04/23/1956    This patient has been seen and discussed with the house staff. Please see their note for complete details. I concur with their findings with the following additions/corrections: States she feels well this morning. States she ran out of O2 at home and her inhalers.  Denies CP. SOB is better. There is a report of small amount of bright red blood per rectum that has not reoccurred per patient.  A/P: 55 yr. Old AAF w/ O2 dependent COPD, hx NSCLC s/p radiation tx, HTN, Alcohol abuse, tobacco abuse, admitted with AECOPD, CAP, and acute on chronic hypoxic/hypercarbic respiratory failure, incidentally noted to have Trichomonas UTI. 1. Acute hypoxic/hypercarbic respiratory failure and CAP: Agree with treatment with levaquin for 10 days. She is rapidly improving. Prednisone taper. Xopenex/atrovent. She needs assistance with meds and O2 at home. 2. Sinus Tach: This is secondary to current tx and acute illness. Monitor. 3. HTN: agree with current slow addition of home meds. 4. Trichomonas UTI: s/p treatment with flagyl. 5. Episode of BRBPR: will need outpatient eval. Repeat CBC in AM. Will need colonoscopy as outpatient if not already done. 6. Rest per resident physician note.   Jonah Blue, DO  04/16/2012, 1:00 PM

## 2012-04-16 NOTE — Progress Notes (Signed)
Name: Karina Weber MRN: [id number] DOB: Dec 09, 1955    LOS: 3  Referring Provider:  Danise Edge Reason for Referral:  COPD with respiratory failure.  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  Patient responded well to neb treatment but with exertion was in severe respiratory distress.  PCCM called on consultation for active respiratory distress and hypotension.  Past Medical History  Diagnosis Date  . Tobacco abuse   . COPD (chronic obstructive pulmonary disease)     exacerbagtion in 6/12 and in 2010.   Marland Kitchen Hypertension   . History of radiation therapy 07/19/10 to 07/28/10    RUL lung  . 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 Alcohol abuse     2 beers a day   Past Surgical History  Procedure Date  . No past surgeries    Prior to Admission medications   Medication Sig Start Date End Date Taking? Authorizing Provider  albuterol (PROVENTIL) (2.5 MG/3ML) 0.083% nebulizer solution Take 3 mLs (2.5 mg total) by nebulization every 6 (six) hours as needed for wheezing. 12/30/11 12/29/12 Yes Judie Bonus, MD  albuterol (VENTOLIN HFA) 108 (90 BASE) MCG/ACT inhaler Inhale 1 puff into the lungs every 4 (four) hours as needed for wheezing. 12/30/11  Yes Judie Bonus, MD  amLODipine (NORVASC) 10 MG tablet Take 10 mg by mouth daily.   Yes Historical Provider, MD  aspirin 81 MG tablet Take 1 tablet (81 mg total) by mouth daily. 03/15/11  Yes Zollie Beckers, MD  Fluticasone-Salmeterol (ADVAIR) 250-50 MCG/DOSE AEPB Inhale 1 puff into the lungs every 12 (twelve) hours. 12/30/11  Yes Judie Bonus, MD  guaiFENesin (MUCINEX) 600 MG 12 hr tablet Take 1 tablet (600 mg total) by mouth 2 (two) times daily. 12/30/11 12/29/12 Yes Judie Bonus, MD  hydrochlorothiazide (HYDRODIURIL) 12.5 MG tablet Take 12.5 mg by mouth daily.    Yes Historical Provider, MD  ipratropium (ATROVENT) 0.02 % nebulizer solution Take 2.5 mLs (500 mcg total) by nebulization 4 (four) times daily. 12/30/11  Yes Judie Bonus, MD  mirtazapine (REMERON) 15 MG tablet Take 15 mg by mouth at bedtime.   Yes Historical Provider, MD   Allergies Allergies  Allergen Reactions  . Lisinopril Cough     Vital Signs: Temp:  [98.2 F (36.8 C)-98.6 F (37 C)] 98.3 F (36.8 C) (07/15 0503) Pulse Rate:  [98-130] 116  (07/15 0503) Resp:  [18-34] 18  (07/15 0503) BP: (114-145)/(72-88) 145/85 mmHg (07/15 0503) SpO2:  [90 %-96 %] 92 % (07/15 0943) Weight:  [94 lb 12.8 oz (43 kg)] 94 lb 12.8 oz (43 kg) (07/14 2024)  Physical Examination: General:  Cachectic AAF in moderate respiratory distress. Neuro:  Alert, oriented and interactive, moves all ext to command. HEENT:  [place]/AT, PERRL, EOM-I, -LAN and DMM. Neck:  Supple, -LAN and -thyromegally. Cardiovascular:  RRR, Nl S1/S2, -M/R/G. Lungs:  Decreased BS in all lung fields. Abdomen:  Soft, NT, ND and +BS. Musculoskeletal:  -edema and -tenderness. Skin:  Intact.  OVERNIGHT: 7/13> breathing better, sl cough w/ thick beige phlegm, no hemoptysis   Principal Problem:  *CAP (community acquired pneumonia) Active Problems:  TOBACCO USE  ESSENTIAL HYPERTENSION  COPD  Acute-on-chronic respiratory failure  Protein calorie malnutrition  Trichomonal infection  Tachycardia   ASSESSMENT AND PLAN:  PULMONARY  Lab 04/13/12 1250  PHART 7.280*  PCO2ART 54.4*  PO2ART 64.1*  HCO3 24.7*  O2SAT 89.8     CXR 7/12>  Hyperinflation with ?RLL airspace disease.  CT Chest 12/21/11> IMPRESSION:  1. Further slight decrease in size of posterior right upper lung  nodule. Other smaller bilateral pulmonary nodular densities  remains stable.  2. Slight increase in right paratracheal mediastinal  lymphadenopathy. No new lymphadenopathy identified.  A:   1) COPD, active smoker, acute on chronic resp  insuffic,R/O superimposed PNA>>  2) Acute respiratory distress that responded to nebs and O2. 3) Non small cell lung cancer, s/p XRT  P:   - Continue O2, titrate for sat of 88-92%. - Agree with steroids, . - Levaquin is an excellent choice of abx in this case (last admission was in march). - Recommend a nebulized steroids at or prior to discharge as I highly doubt patient can generate enough flow to inhale any MDI/HFA with such hyper inflation. - Smoking cessation. - IS and flutter valve to encourage airway clearance. - Early mobilization. - Will need PFT's once acute event is complete and will need f/u with a pulmonary MD. Can do as OPT.    Brett Canales Minor ACNP [facility] PCCM Pager [phone] till 3 pm If no answer page [phone] 04/16/2012, 11:41 AM  Reviewed above, examined pt, and agree with assessment plan.  Slowly improving.  Will arrange for pulmonary follow up in next two to three weeks.  Call if additional help needed while she is in hospital.  Coralyn Helling, MD [facility] Pulmonary/Critical Care 04/16/2012, 2:33 PM Pager:  [phone] After 3pm call: [phone]

## 2012-04-16 NOTE — Progress Notes (Signed)
Pharmacy - Lovenox Dose Adjustment   56 yo F started on Lovenox 40 mg SQ daily for VTE prophylaxis.  Despite patient's CrCl >30, her TBW is 43 kg and this dose is near treatment dose for her low body weight.  Will adjust Lovenox to 30 mg SQ daily for VTE prophylaxis.  [employer], Pharm.D., BCPS Clinical Pharmacist Pager [phone] 04/16/2012 1:19 PM

## 2012-04-17 ENCOUNTER — Inpatient Hospital Stay (HOSPITAL_COMMUNITY): Payer: Medicaid Other

## 2012-04-17 DIAGNOSIS — I10 Essential (primary) hypertension: Secondary | ICD-10-CM

## 2012-04-17 LAB — CBC WITH DIFFERENTIAL/PLATELET
Basophils Absolute: 0 10*3/uL (ref 0.0–0.1)
Eosinophils Absolute: 0 10*3/uL (ref 0.0–0.7)
Lymphs Abs: 1.6 10*3/uL (ref 0.7–4.0)
MCH: 28.9 pg (ref 26.0–34.0)
MCHC: 31 g/dL (ref 30.0–36.0)
MCV: 93 fL (ref 78.0–100.0)
Monocytes Absolute: 1.1 10*3/uL — ABNORMAL HIGH (ref 0.1–1.0)
Neutrophils Relative %: 68 % (ref 43–77)
Platelets: 376 10*3/uL (ref 150–400)
RDW: 16.8 % — ABNORMAL HIGH (ref 11.5–15.5)
Smear Review: ADEQUATE

## 2012-04-17 LAB — COMPREHENSIVE METABOLIC PANEL
ALT: 15 U/L (ref 0–35)
BUN: 18 mg/dL (ref 6–23)
CO2: 37 mEq/L — ABNORMAL HIGH (ref 19–32)
Calcium: 9.4 mg/dL (ref 8.4–10.5)
Creatinine, Ser: 0.54 mg/dL (ref 0.50–1.10)
GFR calc Af Amer: >90 mL/min (ref >90)
GFR calc non Af Amer: >90 mL/min (ref >90)
Glucose, Bld: 114 mg/dL — ABNORMAL HIGH (ref 70–99)
Sodium: 141 mEq/L (ref 135–145)
Total Protein: 6.3 g/dL (ref 6.0–8.3)

## 2012-04-17 MED ORDER — AMLODIPINE BESYLATE 5 MG PO TABS: 5.0000 mg | ORAL_TABLET | Freq: Every day | ORAL | Status: DC

## 2012-04-17 MED ORDER — ENSURE COMPLETE PO LIQD
237.0000 mL | Freq: Two times a day (BID) | ORAL | Status: DC
  Administered 2012-04-17 – 2012-04-18 (×2): 237 mL via ORAL

## 2012-04-17 MED ORDER — IOHEXOL 350 MG/ML SOLN
100.0000 mL | Freq: Once | INTRAVENOUS | Status: AC | PRN
  Administered 2012-04-17: 100 mL via INTRAVENOUS

## 2012-04-17 MED ORDER — AMLODIPINE BESYLATE 10 MG PO TABS
10.0000 mg | ORAL_TABLET | Freq: Every day | ORAL | Status: DC
  Administered 2012-04-18: 10 mg via ORAL
  Filled 2012-04-17: qty 1

## 2012-04-17 NOTE — Progress Notes (Signed)
INTERNAL MEDICINE TEACHING SERVICE Attending Note  Date: 04/17/2012  Patient name: Karina Weber  Medical record number: [id number]  Date of birth: [birth year]    This patient has been seen and discussed with the house staff. Please see their note for complete details. I concur with their findings with the following additions/corrections: States she's been having abdominal pain on and off for months. She states it's due to "holding her water". Denies any further episodes of rectal bleeding. Describes episode of emesis yesterday after eating. On exam she has tenderness throughout her abdomen, perhaps more notable over LLQ and LUQ. No guarding is noted. She ambulated with RN on RA with O2 sats of 57% on RA, only improving to 84% on 3.5L O2. This morning denied SOB, CP.  A/P: 55 yr. Old AAF w/ O2 dependent COPD, hx NSCLC s/p radiation tx, HTN, Alcohol abuse, tobacco abuse, admitted with AECOPD, CAP, and acute on chronic hypoxic/hypercarbic respiratory failure, incidentally noted to have Trichomonas UTI, with chronic abdominal pain and episode of BRBPR. 1. Acute hypoxic/hypercarbic respiratory failure and CAP: Agree with treatment with levaquin for 10 days total. She is improving. Prednisone taper. Xopenex/atrovent. She needs assistance with meds and O2 at home. She became significantly hypoxic on RA, likely driving her sinus tach this morning. I also cannot rule out acute PE at this time, and due to her significant hypoxia, I would order CTA chest now. Need records to have objective measure of GOLD staging of COPD. 2. Sinus Tach: This may be secondary to hypoxia, xopenex use, CAP, and possibly PE. CTA chest now due to hx malignancy and significant hypoxia. 3. HTN: monitor. Amlodipine added. Maybe HCTZ as outpatient to be started by PCP. 4. Trichomonas UTI: s/p treatment with flagyl. 5. Abdominal pain/episode of BRBPR: Needs colonoscopy as outpatient. Check PVR to make sure there is no urinary retention  given her complaint above.  If worsens, will need CT abd/pelvis. H/H stable. 6. Rest per resident physician note, discussed with resident physician.  Jonah Blue, DO  04/17/2012, 2:06 PM

## 2012-04-17 NOTE — Progress Notes (Signed)
UR Completed Brisia Schuermann Graves-Bigelow, RN,BSN [phone]  

## 2012-04-17 NOTE — Progress Notes (Signed)
O2  saturation was 57% when pt. Walks at room air.Pt. Is 84% on 31/2 Litters of O2

## 2012-04-17 NOTE — Progress Notes (Signed)
Patient ID: Karina Weber, female   DOB: 1955/11/30, 56 y.o.   MRN: [id number] Medical Student Daily Progress Note  Subjective: Patient reported that she felt well but has a chronic abdominal pain that she says comes and goes and that has been happening for months.  Interval Events: Vomiting episode yesterday. Objective: Vital signs in last 24 hours: Filed Vitals:   04/17/12 1053 04/17/12 1320 04/17/12 1322 04/17/12 1324  BP:  111/86 118/75 114/80  Pulse: 98 110 122 130  Temp:  97.9 F (36.6 C)    TempSrc:  Oral    Resp:  20 22 26   Height:      Weight:      SpO2: 94%      Weight change: 0.636 kg (1 lb 6.4 oz)  Intake/Output Summary (Last 24 hours) at 04/17/12 1340 Last data filed at 04/17/12 0900  Gross per 24 hour  Intake    480 ml  Output      0 ml  Net    480 ml   Physical Exam: General Appearance:  Alert, cooperative, no distress, appears malnourished   Head:  Normocephalic, without obvious abnormality, atraumatic   Back:  Symmetric, no curvature, ROM normal, no CVA tenderness   Lungs:  Clear to auscultation bilaterally   Chest Wall:  No tenderness or deformity   Heart:  Increased rate, S1 and S2 normal   Abdomen:  Soft, complains of lower left abdominal pain, no masses, no organomegaly     Lab Results: Basic Metabolic Panel:  Lab 04/17/12 4098 04/16/12 0510 04/13/12 0900  NA 141 139 --  K 3.6 4.3 --  CL 95* 96 --  CO2 37* 36* --  GLUCOSE 114* 93 --  BUN 18 19 --  CREATININE 0.54 0.44* --  CALCIUM 9.4 9.7 --  MG -- -- 1.5  PHOS -- -- --   Liver Function Tests:  Lab 04/17/12 0925 04/13/12 0900  AST 22 20  ALT 15 7  ALKPHOS 102 166*  BILITOT 0.1* 0.2*  PROT 6.3 7.9  ALBUMIN 2.7* 3.0*    Lab 04/17/12 0925  LIPASE 60*  AMYLASE --   No results found for this basename: AMMONIA:2 in the last 168 hours CBC:  Lab 04/17/12 0600 04/16/12 0510 04/13/12 0900  WBC 8.6 10.8* --  NEUTROABS 5.9 -- 13.1*  HGB 11.6* 11.3* --  HCT 37.4 35.9* --  MCV 93.0  92.8 --  PLT 376 392 --   Cardiac Enzymes:  Lab 04/13/12 0900  CKTOTAL --  CKMB --  CKMBINDEX --  TROPONINI <0.30   BNP: No results found for this basename: PROBNP:3 in the last 168 hours D-Dimer: No results found for this basename: DDIMER:2 in the last 168 hours CBG: No results found for this basename: GLUCAP:6 in the last 168 hours Hemoglobin A1C: No results found for this basename: HGBA1C in the last 168 hours Fasting Lipid Panel: No results found for this basename: CHOL,HDL,LDLCALC,TRIG,CHOLHDL,LDLDIRECT in the last 119 hours Thyroid Function Tests: No results found for this basename: TSH,T4TOTAL,FREET4,T3FREE,THYROIDAB in the last 168 hours Coagulation: No results found for this basename: LABPROT:4,INR:4 in the last 168 hours Anemia Panel: No results found for this basename: VITAMINB12,FOLATE,FERRITIN,TIBC,IRON,RETICCTPCT in the last 168 hours Urine Drug Screen: Drugs of Abuse     Component Value Date/Time   LABOPIA NEGATIVE 03/10/2011 0520   LABOPIA NONE DETECTED 11/27/2009 0514   COCAINSCRNUR NEGATIVE 03/10/2011 0520   COCAINSCRNUR NONE DETECTED 11/27/2009 0514   LABBENZ NEGATIVE 03/10/2011 0520  LABBENZ NONE DETECTED 11/27/2009 0514   AMPHETMU NEGATIVE 03/10/2011 0520   AMPHETMU NONE DETECTED 11/27/2009 0514   THCU NONE DETECTED 11/27/2009 0514   LABBARB  Value: NONE DETECTED        DRUG SCREEN FOR MEDICAL PURPOSES ONLY.  IF CONFIRMATION IS NEEDED FOR ANY PURPOSE, NOTIFY LAB WITHIN 5 DAYS.        LOWEST DETECTABLE LIMITS FOR URINE DRUG SCREEN Drug Class       Cutoff (ng/mL) Amphetamine      1000 Barbiturate      200 Benzodiazepine   200 Tricyclics       300 Opiates          300 Cocaine          300 THC              50 11/27/2009 0514    Alcohol Level: No results found for this basename: ETH:2 in the last 168 hours Urinalysis:  Lab 04/13/12 1041  COLORURINE YELLOW  LABSPEC 1.017  PHURINE 5.5  GLUCOSEU NEGATIVE  HGBUR TRACE*  BILIRUBINUR SMALL*  KETONESUR 15*  PROTEINUR  30*  UROBILINOGEN 0.2  NITRITE NEGATIVE  LEUKOCYTESUR LARGE*   Misc. Labs:   Micro Results: Recent Results (from the past 240 hour(s))  CULTURE, BLOOD (ROUTINE X 2)     Status: Normal (Preliminary result)   Collection Time   04/13/12  9:45 AM      Component Value Range Status Comment   Specimen Description BLOOD RIGHT ARM   Final    Special Requests BOTTLES DRAWN AEROBIC AND ANAEROBIC 10CC   Final    Culture  Setup Time 04/13/2012 17:34   Final    Culture     Final    Value:        BLOOD CULTURE RECEIVED NO GROWTH TO DATE CULTURE WILL BE HELD FOR 5 DAYS BEFORE ISSUING A FINAL NEGATIVE REPORT   Report Status PENDING   Incomplete   CULTURE, BLOOD (ROUTINE X 2)     Status: Normal (Preliminary result)   Collection Time   04/13/12 10:00 AM      Component Value Range Status Comment   Specimen Description BLOOD RIGHT HAND   Final    Special Requests BOTTLES DRAWN AEROBIC ONLY 10CC   Final    Culture  Setup Time 04/13/2012 17:35   Final    Culture     Final    Value:        BLOOD CULTURE RECEIVED NO GROWTH TO DATE CULTURE WILL BE HELD FOR 5 DAYS BEFORE ISSUING A FINAL NEGATIVE REPORT   Report Status PENDING   Incomplete   URINE CULTURE     Status: Normal   Collection Time   04/13/12 10:41 AM      Component Value Range Status Comment   Specimen Description URINE, CLEAN CATCH   Final    Special Requests NONE   Final    Culture  Setup Time 04/13/2012 11:39   Final    Colony Count 80,000 COLONIES/ML   Final    Culture     Final    Value: Multiple bacterial morphotypes present, none predominant. Suggest appropriate recollection if clinically indicated.   Report Status 04/14/2012 FINAL   Final   MRSA PCR SCREENING     Status: Normal   Collection Time   04/13/12 12:30 PM      Component Value Range Status Comment   MRSA by PCR NEGATIVE  NEGATIVE Final    Studies/Results:  No results found. Medications: I have reviewed the patient's current medications. Scheduled Meds:   . amLODipine   10 mg Oral Daily  . enoxaparin (LOVENOX) injection  30 mg Subcutaneous Q24H  . Fluticasone-Salmeterol  1 puff Inhalation Q12H  . guaiFENesin  1,200 mg Oral BID  . levofloxacin  500 mg Oral q1800  . predniSONE  60 mg Oral Q breakfast  . sodium chloride  3 mL Intravenous Q12H  . DISCONTD: amLODipine  10 mg Oral Daily  . DISCONTD: amLODipine  5 mg Oral Daily   Continuous Infusions:  PRN Meds:.acetaminophen, acetaminophen, ipratropium, levalbuterol, ondansetron (ZOFRAN) IV, ondansetron, traZODone Assessment/Plan:  Ms. Schneiderman is a 56 y/o woman with a PMH of severe COPD, HTN, non-small cell lung cancer s/p radiation therapy, alcoholism, and tobacco use who presented with a COPD exacerbation 2/2 medicine noncompliance and possible pneumonia.   1. CAP: There is a probable infiltrate on the chest x-ray. This, coupled with her increased WBC on admission, suggest possible CAP. She has not been hospitalized since March of 2013. She was markedly hypoxic on admission but improved with nebulizers, steroids, and supplemental oxygen. Saturations have maintained in the 90s today. Her white count has normalized.  - continue to monitor  - oral prednisone 60 mg. Will have 15 day taper as an outpatient.  - Day 5 of PO levaquin 500 mg. Will have total of 10 day course   2. Acute on Chronic respiratory failure: On arrival she was markedly hypoxemic. ABG on admission: 7.280/54.4/64.1/26.4. Her measured Bicarb was 29 on her Bmet. Findings suggestive of acute respiratory acidosis. She was walked without oxygen with nursing and 02 sats fell to the 50's.  The patient is currently saturating well on 2L of O2, and does have BiPAP PRN.  - continue to monitor vitals - outpatient pulmonlogy   3. COPD exacerbation 2/2 medication noncompliance: Patient had not used COPD meds for 10 days prior to admission. She had significant wheezes on admission, which have since resolved.  - oral prednisone 60 mg with taper for two weeks.    - home 02 requirement, 2l by nasal cannula as inpatient  - continue xopenex/atrovent prn  - Respiratory therapy to educate patient on use of medications  - PFT's as outpatient with follow up with Pulmonology. -CM consult to assess need for new 02 machine.   5. Tachycardia: Patient has asymptomatic sinus tachycardia that ranges from low 100's to the 130's. Initially thought it was due to the albuterol nebs so the nebs were switched to xopenex and then made PRN. Continues to have tachycardia in light of patient being symptomatically better and with 02 sats of 95% with 2 L of 02. Likely physiologic or 2/2 steroids - monitor vital signs  - Likely physiologic - follow up as outpatient    6. Hypertension- Restart home dose of Amlodipine. Consider restarting HCTZ as an outpatient given low BP's and poor nutrition intake. Has risk of becoming dehydrated if HCTZ is started now. - Assess regiment as an outpatient.   7. Trichomonal infection: Ms. Mccowen had pyuria as well as trichomonas on admission. Treated with flagyl 2g x 1 dose. Counseled that partner will need treatment.   8. Tobacco abuse: Holding nicotine replacement 2/2 tachycardia. Patient has received tobacco cessation counseling.   9. Protein Calorie malnutrition and underweight: She is significantly underweight. Nutrition has been consulted and appreciate recs.  -On regular diet  10. Abdominal pain-  Patient complained of abdominal pain. On speaking with patient she says  that she had the pain for some time and she attributes the pain to "holding her water". Patient is urinating but a post-void residual will be taken to see if there are any urinary issues. A lipase was ordered 2/2 history of alcoholism and vomiting episode. Lipase is 60 which is very weakly positive and could just be irritation from the vomiting episode. Also this could be 2/2 prophylactic heparin sticks or be connected to her positive FOBT. Will have outpatient GI to follow  up.  10. Blood per rectum- Small amount of blood on toilet paper was reported yesterday. Hgb stable. FOBT was positive, patient has never had a colonoscopy. Will arrange for GI follow up as an outpatient.   11. DVT prophy: Lovenox   12. Dispo: Case management has spoken to the patient about medications and new home 02 machine. Will await PT/OT recs. Counseled on Alcohol abuse. Possibly home today if patient continues to remain stable.   LOS: 4 days   This is a Psychologist, occupational Note.  The care of the patient was discussed with Dr. Saralyn Pilar and the assessment and plan formulated with their assistance.  Please see their attached note for official documentation of the daily encounter.  Eather Colas T 04/17/2012, 1:40 PM

## 2012-04-17 NOTE — Progress Notes (Signed)
   INTERIM NOTE   Patient was ambulated by RN and found to be severely hypoxic with O2 saturation of 57% on room air and 84% on 3L O2 via [place]. Given her also persistent sinus tachycardia, and known active malignancy, she is at increased risk for PE. Therefore, this was discussed with Dr. Kem Kays, with the decision that would be best to check CT Angiogram of the chest to evaluate for PE.  Patient will NOT be discharged home today until will further evaluate this possibility.       Signed: Johnette Abraham, Roma Schanz, Internal Medicine Resident Pager: [phone] (7AM-5PM) 04/17/2012, 2:21 PM

## 2012-04-17 NOTE — Progress Notes (Signed)
RESIDENT ADDENDUM TO MEDICAL STUDENT NOTE    I have seen and examined the patient, and reviewed the daily progress note by Eather Colas, MS IV and discussed the care of the patient with them. Please see my progress note from 04/17/2012 for further details regarding assessment and plan.    Signed:  Johnette Abraham, Roma Schanz, Internal Medicine Resident  Pager: [phone] (7AM-5PM)  04/06/2012, 10:42 AM

## 2012-04-17 NOTE — Discharge Summary (Signed)
Patient Name:  Karina Weber MRN: [id number]  PCP: Leodis Sias, MD DOB:  09-05-1956       Date of Admission:  04/13/2012  Date of Discharge:  04/18/2012     Attending Physician: Dr. Blanch Media      DISCHARGE DIAGNOSES: 1. Community acquired pneumonia - to complete 10 day course of Levaquin (on day 5/10 on day of discharge)  2. COPD exacerbation - in the setting of baseline chronic oxygen-dependent COPD. Acute exacerbation likely secondary to #1 and noncompliance with inhalers (ran out, no refills) x 10 days prior to admission. 3. Acute on chronic respiratory failure secondary to #1 and 2 - On admissionhypoxemic with ABG showing pH 7.280/54.4/64.1/26.4BP. CTA chest negative  Outpatient pulmonology follow-up arranged. 4. Sinus tachycardia - stable by discharge. Likely multifactorial secondary to bronchodilators (albuterol/ xoponex), prednisone 5. Hypertension - blood pressures marginally low in setting of #1 and 2. Resumed amlodipine 10mg , and held of HCTZ. 6. Trichomonal infection - Treated with flagyl. Advised that partner will need treatment. 7. Protein Calorie malnutrition and underweight - Likely secondary to her cancer in addition to reduced oral intake due to alcohol abuse. Body mass index is 17.60 kg/(m^2).  8. Blood in stool - FOBT positive, hemoglobin stable at baseline of 11-12. Will need outpatient GI follow-up. 9. Abdominal pain - unclear etiology (not constipated, no UTI, abdomen soft). Possibly related to #7 versus from heparin injections.  10. Non-small cell lung cancer s/p radiation therapy 11. Tobacco abuse - ongoing 1ppd x 40 years, smoking cessation counseling provided. 12. Alcoholism - 2 beers daily  DISPOSITION AND FOLLOW-UP: Karina Weber is to follow-up with the listed providers as detailed below, at which time, the following should be addressed:   1. PCP  By problem:  HTN - please assess blood pressures, she was held of her HCTZ and reduced of  her amlodipine in setting of hypotension. May need to escalate therapy at followup.  Blood in stools - FOBT (+), has not had prior colonoscopy. Therefore, will need gastroenterology referral at follow-up.  Abdominal pain - please assess for resolution.  CAP - consider CXR in 4-6 weeks to ensure resolution if symptoms persistent.  Labs / imaging needed at time of follow-up: None.  Pending labs/ test needing follow-up: Blood cultures x 2.  2. Pulmonology  Please assess status of her COPD, adjust regimen as appropriate.   Please schedule for outpatient PFTs as acute pulmonary symptoms stabilize.   DISCHARGE INSTRUCTIONS: Follow-up Information    Follow up with Ky Barban, MD on 04/23/2012. (INTERNAL MEDICINE - Your appointment is on 04/23/2012 at 10:30AM)    Contact information:   [address] [place] [address] [phone]       Follow up with [facility], NP on 05/03/2012. (PULMONOLOGY (Lung doctor) - Your appointment is on  05/03/2012 at 11:15 AM --> YOU MUST ARRIVE BY 11:00AM)    Contact information:   [facility] [address] [place] [address] [phone]         Discharge Orders    Future Appointments: Provider: Department: Dept Phone: Center:   04/23/2012 10:30 AM Ky Barban, MD [facility] [phone] [facility]   05/03/2012 11:15 AM Julio Sicks, NP [facility] [phone] None     Future Orders Please Complete By Expires   Diet - low sodium heart healthy      Increase activity slowly      Discharge instructions  Comments:   1.Stop your HCTZ (Hydrochlorothiazide) until your follow up appointment.  2. Start Levaquin 500 mg tablets tomorrow.  Take 1 tablet daily for 5 more days.  3.  Start Prednisone 20 mg tablets tomorrow morning.  Take 2 tablets daily for 3 days then 1 tablet daily for 3 days then 1/2 tablet daily for 4 days the stop.   Call MD for:  temperature  >100.4      Call MD for:  difficulty breathing, headache or visual disturbances        DISCHARGE MEDICATIONS: Medication List  As of 04/18/2012 12:22 PM   STOP taking these medications         hydrochlorothiazide 12.5 MG tablet         TAKE these medications         albuterol (2.5 MG/3ML) 0.083% nebulizer solution   Commonly known as: PROVENTIL   Take 3 mLs (2.5 mg total) by nebulization every 6 (six) hours as needed for wheezing.      albuterol 108 (90 BASE) MCG/ACT inhaler   Commonly known as: PROVENTIL HFA;VENTOLIN HFA   Inhale 1 puff into the lungs every 4 (four) hours as needed for wheezing.      amLODipine 10 MG tablet   Commonly known as: NORVASC   Take 10 mg by mouth daily.      aspirin 81 MG tablet   Take 1 tablet (81 mg total) by mouth daily.      Fluticasone-Salmeterol 250-50 MCG/DOSE Aepb   Commonly known as: ADVAIR   Inhale 1 puff into the lungs every 12 (twelve) hours.      guaiFENesin 600 MG 12 hr tablet   Commonly known as: MUCINEX   Take 1 tablet (600 mg total) by mouth 2 (two) times daily.      ipratropium 0.02 % nebulizer solution   Commonly known as: ATROVENT   Take 2.5 mLs (500 mcg total) by nebulization 4 (four) times daily.      levofloxacin 500 MG tablet   Commonly known as: LEVAQUIN   Take 1 tablet (500 mg total) by mouth daily at 6 PM.      mirtazapine 15 MG tablet   Commonly known as: REMERON   Take 15 mg by mouth at bedtime.      predniSONE 20 MG tablet   Commonly known as: DELTASONE   Starting 7/18 Take 2 tablets daily for 3 days then 1 tablet daily for 3 days then 1/2 tablet daily for 4 days then stop.   Start taking on: 04/19/2012      traZODone 100 MG tablet   Commonly known as: DESYREL   Take 1 tablet (100 mg total) by mouth at bedtime as needed for sleep.           CONSULTS:    PCCM  PROCEDURES PERFORMED:   Dg Chest 2 View (04/14/2012) - Similar lower lobe predominant interstitial thickening with thickening of the right  minor fissure.  Favor pulmonary venous congestion versus less likely atypical infection.  Original Report Authenticated By: Consuello Bossier, M.D.   Dg Chest Port 1 View (04/13/2012) - Mild thickening of the pulmonary interstitium with the bilateral mid and lower lungs, right greater than left with small amount of fluid tracking in the right minor fissure.  These findings are nonspecific but may be seen in the setting of pulmonary edema, though atypical infection may have a similar appearance.  Clinical correlation is advised.  Further evaluation with a PA and  lateral chest radiograph may be obtained as clinically indicated.  Original Report Authenticated By: Waynard Reeds, M.D.     ADMISSION DATA: H&P: Ms. Wieting is a 56 year old woman with a PMH significant for COPD, HTN, non-small cell lung cancer s/p radiation therapy, alcoholism, and tobacco abuse who presents to the [facility] ED complaining of shortness of breath. She states she ran out of her medications for her COPD last Wednesday and was unable to get a refill with the July 4th holiday. She also states that for the last week or so she has had fevers, chills, and an increased cough with little to no production. She denies sick contacts, diarrhea, or constipation.  She also states that over the last few days she has been more nauseated and has vomited several times. She states that when she vomits she has epigastric pain and has noticed a small amount of bright red blood in her vomit. She states that the abdominal pain gets worse if she eats and gets better when she doesn't eat. She denies diarrhea, blood in her stool, or black tarry stools.   Physical Exam: Vital Signs: Blood pressure 109/56, pulse 132, temperature 98.7 F (37.1 C), temperature source Oral, resp. rate 28, SpO2 94.00%.   Exam: Constitutional: Vital signs reviewed. Patient is a cachectic woman in moderate distress from shortness of breath. She is cooperative with exam. Alert and oriented  x3.  Head: Normocephalic and atraumatic  Ear: TM normal bilaterally  Mouth: no erythema or exudates, MMM  Eyes: PERRL, EOMI, conjunctivae normal, No scleral icterus.  Neck: Supple, Trachea midline normal ROM, No JVD, mass, thyromegaly, or carotid bruit present.  Cardiovascular: tachycardic but regular rhythm, S1 normal, S2 normal, no MRG, pulses symmetric and intact bilaterally  Pulmonary/Chest: prominent expiratory wheezes in all lung fields, no crackles or rhonchi.  Abdominal: Soft. Moderate epigastric tenderness, non-distended, bowel sounds are normal, no masses, organomegaly, or guarding present.  GU: no CVA tenderness Musculoskeletal: No joint deformities, erythema, or stiffness, ROM full and no nontender Hematology: no cervical, inginal, or axillary adenopathy.  Neurological: A&O x3, Strength is normal and symmetric bilaterally, cranial nerve II-XII are grossly intact, no focal motor deficit, sensory intact to light touch bilaterally.  Skin: Warm, dry and intact. No rash, cyanosis, or clubbing.  Psychiatric: Normal mood and affect. speech and behavior is normal. Judgment and thought content normal. Cognition and memory are normal.   Labs: Basic Metabolic Panel:  Basename  04/13/12 0900   NA  134*   K  4.3   CL  92*   CO2  29   GLUCOSE  104*   BUN  11   CREATININE  0.45*   CALCIUM  9.8   MG  1.5   PHOS  --    Liver Function Tests:  Basename  04/13/12 0900   AST  20   ALT  7   ALKPHOS  166*   BILITOT  0.2*   PROT  7.9   ALBUMIN  3.0*    CBC:  Basename  04/13/12 0900   WBC  15.5*   NEUTROABS  13.1*   HGB  12.4   HCT  39.0   MCV  91.5   PLT  302    Cardiac Enzymes:  Basename  04/13/12 0900   CKTOTAL  --   CKMB  --   CKMBINDEX  --   TROPONINI  <0.30    Urinalysis:  Basename  04/13/12 1041   COLORURINE  YELLOW  LABSPEC  1.017   PHURINE  5.5   GLUCOSEU  NEGATIVE   HGBUR  TRACE*   BILIRUBINUR  SMALL*   KETONESUR  15*   PROTEINUR  30*   UROBILINOGEN  0.2    NITRITE  NEGATIVE   LEUKOCYTESUR  LARGE   HOSPITAL COURSE: 1. CAP - On admission, the patient complained of progressive shortness of breath over last 10-11 days prior to admission. Admission CXR showed probable infiltrate, which in addition to her preceding week of fevers, chills, shortness of breath, is most consistent with community acquired pneumonia. Therefore, the patient was started on Levaquin therapy, which she will continue for total of 10 days, with day 5 of treatment on day of discharge. Symptoms significantly improved by time of discharge.   2. COPD exacerbation - in the setting of baseline chronic oxygen-dependent COPD. On admission, the patient was noted to have an acute COPD exacerbation likely secondary to #1, noncompliance with inhalers (ran out, no refills) x 10 days prior to admission, and ongoing tobacco abuse. She was therefore started on Levaquin for CAP, as well, she was provided Albuterol (then converted to Xoponex secondary to tachycardia) and Atrovent, which were initially scheduled and then transitioned to as needed therapy. She was also initially started on IV steroids, which were then transitioned to oral prednisone the subsequent day. After this transition, the patient experienced some respiratory distress, therefore, PCCM was consulted, who recommended continued IV steroids. Within next 1-2 days, symptoms stabilized, therefore, patient was transitioned to oral prednisone with stabilization of symptoms. She will continue the prednisone at the time of discharge for a total 2 week course. PCCM recommeded outpatient PFT's after acute pulmonary issues stabilize.   3. Acute on chronic respiratory failure secondary to #1 and 2 - On admission, pt notably hypoxemic with ABG showing pH 7.280/54.4/64.1/26.4. On 07/16, patient was ambulated off of oxygen to qualify for home oxygen, she was noted to have O2 saturation to 57% on RA, which increased to 84% immediately after resuming O2  supplementation. However, given extent of hypoxemia, CTA chest was ordered to assess for PE, and was negative for evidence of PE. Symptoms stabilized by time of discharge as noted above in #1, 2.  4. Sinus tachycardia - patient was noted to have sinus tachycardia with rates up to 140s bpm (from baseline in 90s). This was likely multifactorial secondary to bronchodilators (albuterol/ xoponex), prednisone, general deconditioning. Therefore, albuterol was changed to Xoponex, and made as PRN after respiratory symptoms stabilized. Tachycardia improved to 90s by time of discharge.   5. Hypertension - On admission, patient was started on PRN IV blood pressure medications with oral medications held secondary to respiratory distress. She required very little of this medication. Her home Amlodipine was resumed on day prior to discharge. At follow-up will need to be reassessed if her home HCTZ should be resumed. Given low oral intake, poor nutritional status, and alcohol abuse, HCTZ may not be a great option for her with risk of getting volume deplete easily.  6. Trichomonal infection - patient noted to have trichomonas infection per UA. She was, therefore, treated with flagyl 2g x 1 dose. Patient was informed that she needs to talk to her partner and needs to be treated as well for this infection.   7. Protein Calorie malnutrition and underweight - Likely secondary to her cancer in addition to reduced oral intake due to alcohol abuse. Body mass index is 17.60 kg/(m^2). Continue regular diet per nutrition.   8. Blood  in stool - patient noted that she had small amount of dark blood in stool during hospital course, which was a single episode that has not returned since that time. Hgb was monitored and found to be stable and at baseline. FOBT was checked and was positive. Therefore, she will need outpatient GI referral and colonoscopy (which she not yet had in the past).   9. Abdominal pain - On day of discharge,  patient noted diffuse mild abdominal pain. Etiology unclear, lipase only mildly elevated at 60 and CMET unremarkable. Thought to be possible due to #8 versus secondary to site of Lovenox injections. Post-void residual was checked with PVR of 48 documented. She was stable by time of discharge.  10. Tobacco abuse - patient described 40 pack year history of cigarette smoking that is ongoing. She was provided smoking cessation counseling, as well provided information about smoking cessation resources at discharge.  11. Alcohol abuse - drinks 2 beers daily, provided cessation counseling.   12. Non-small cell lung cancer s/p radiation therapy  DISCHARGE DATA: Vital Signs: BP 106/67  Pulse 104  Temp 98.6 F (37 C) (Oral)  Resp 18  Ht 5\' 2"  (1.575 m)  Wt 104 lb 8 oz (47.4 kg)  BMI 19.11 kg/m2  SpO2 94%  Labs: No results found for this or any previous visit (from the past 24 hour(s)). Time spent on discharge: 35 minutes  Signed: Marcha Licklider Internal Medicine Resident Pager: [phone] 04/18/2012 12:22 PM

## 2012-04-17 NOTE — Progress Notes (Signed)
Nutrition Follow-up  Intervention:  1. Ensure Complete po BID, each supplement provides 350 kcal and 13 grams of protein. 2. RD to continue to follow nutrition care plan  Assessment:   Pt advanced to Regular diet on 7/12. Pt states she has a good appetite, confirmed 100% meal completion. Pt with snacks in rooms, including potato chips. Pt snacking during RD visit. Interested in trying Ensure/Boost. Provided pt with Ensure Complete supplement and encouraged her to drink daily. Provided her with coupons to purchase when d/c.  Diet Order:  Regular  Meds: Scheduled Meds:   . amLODipine  10 mg Oral Daily  . enoxaparin (LOVENOX) injection  30 mg Subcutaneous Q24H  . Fluticasone-Salmeterol  1 puff Inhalation Q12H  . guaiFENesin  1,200 mg Oral BID  . levofloxacin  500 mg Oral q1800  . predniSONE  60 mg Oral Q breakfast  . sodium chloride  3 mL Intravenous Q12H  . DISCONTD: amLODipine  10 mg Oral Daily  . DISCONTD: amLODipine  5 mg Oral Daily   Continuous Infusions:  PRN Meds:.acetaminophen, acetaminophen, ipratropium, levalbuterol, ondansetron (ZOFRAN) IV, ondansetron, traZODone  Labs:  CMP     Component Value Date/Time   NA 141 04/17/2012 0925   K 3.6 04/17/2012 0925   CL 95* 04/17/2012 0925   CO2 37* 04/17/2012 0925   GLUCOSE 114* 04/17/2012 0925   BUN 18 04/17/2012 0925   CREATININE 0.54 04/17/2012 0925   CALCIUM 9.4 04/17/2012 0925   PROT 6.3 04/17/2012 0925   ALBUMIN 2.7* 04/17/2012 0925   AST 22 04/17/2012 0925   ALT 15 04/17/2012 0925   ALKPHOS 102 04/17/2012 0925   BILITOT 0.1* 04/17/2012 0925   GFRNONAA >90 04/17/2012 0925   GFRAA >90 04/17/2012 0925     Intake/Output Summary (Last 24 hours) at 04/17/12 1419 Last data filed at 04/17/12 0900  Gross per 24 hour  Intake    480 ml  Output      0 ml  Net    480 ml    Weight Status:  43.6 kg/96 lb  BMI is 19 - WNL  Estimated needs:  1300 - 1500 kcal, 50 - 60 grams protein, at least 1.2 liters of water daily  Nutrition Dx:   Increased nutrient needs r/t increased metabolic demand for lung disease AEB pt at 93% of IBW, unable to gain weight, COPD.  Goal:  1. Resume of diet with tolerance once medically appropriate. Met. 2. New goal: Pt to meet >/= 90% of their estimated nutrition needs.  Monitor:  Weights, labs, PO intake  Jarold Motto MS, [place], [place] Pager: [phone] After-hours pager: [phone]

## 2012-04-17 NOTE — Evaluation (Signed)
Physical Therapy Evaluation Patient Details Name: Karina Weber MRN: [id number] DOB: [birth year] Today's Date: 04/17/2012 Time: 4098-1191 PT Time Calculation (min): 15 min  PT Assessment / Plan / Recommendation Clinical Impression  Pt admitted with SOB and currently able to mobilize without difficulty, SOB or LOB. Pt with sats dropping to 87% on 2L with ambulation but able to maintain 95% on 3L with ambulation. Pt states she feels like she is moving around well and just needs to get her oxygen straightened out. Pt without further needs and pt reports she already performs energy conservation techniques.     PT Assessment  Patent does not need any further PT services    Follow Up Recommendations  No PT follow up    Barriers to Discharge        Equipment Recommendations  None recommended by PT    Recommendations for Other Services     Frequency      Precautions / Restrictions Precautions Precaution Comments: oxygen   Pertinent Vitals/Pain No pain      Mobility  Bed Mobility Bed Mobility: Supine to Sit;Sit to Supine Supine to Sit: 6: Modified independent (Device/Increase time);HOB flat Sit to Supine: 6: Modified independent (Device/Increase time);HOB flat Transfers Transfers: Sit to Stand;Stand to Sit Sit to Stand: 6: Modified independent (Device/Increase time);From bed Stand to Sit: 6: Modified independent (Device/Increase time);To bed Ambulation/Gait Ambulation/Gait Assistance: 6: Modified independent (Device/Increase time) Ambulation Distance (Feet): 500 Feet Assistive device: None Ambulation/Gait Assistance Details: decreased speed of gait for energy conservation Gait Pattern: Step-through pattern Gait velocity: decreased Stairs: No    Exercises     PT Diagnosis:    PT Problem List:   PT Treatment Interventions:     PT Goals    Visit Information  Last PT Received On: 04/17/12 Assistance Needed: +1    Subjective Data  Subjective: I just lay around I  don't do anything Patient Stated Goal: get home   Prior Functioning  Home Living Lives With: Family (pt lives with her kids other grandmother) Available Help at Discharge: Family Type of Home: Apartment Home Access: Level entry Home Layout: One level Bathroom Shower/Tub: Engineer, manufacturing systems: Standard Home Adaptive Equipment: Straight cane Prior Function Level of Independence: Independent Able to Take Stairs?: No Driving: No Vocation: On disability Communication Communication: No difficulties    Cognition  Overall Cognitive Status: Appears within functional limits for tasks assessed/performed Arousal/Alertness: Awake/alert Orientation Level: Appears intact for tasks assessed Behavior During Session: [facility] for tasks performed    Extremity/Trunk Assessment Right Upper Extremity Assessment RUE ROM/Strength/Tone: Within functional levels Left Upper Extremity Assessment LUE ROM/Strength/Tone: Within functional levels Right Lower Extremity Assessment RLE ROM/Strength/Tone: Within functional levels Left Lower Extremity Assessment LLE ROM/Strength/Tone: Within functional levels   Balance    End of Session PT - End of Session Activity Tolerance: Patient tolerated treatment well Patient left: in bed;with call bell/phone within reach Nurse Communication: Mobility status  GP     Toney Sang Beth 04/17/2012, 11:09 AM  Delaney Meigs, PT [phone]

## 2012-04-17 NOTE — Care Management Note (Addendum)
    Page 1 of 2   04/17/2012     2:21:10 PM   CARE MANAGEMENT NOTE 04/17/2012  Patient:  Karina Weber, Karina Weber   Account Number:  [id number]  Date Initiated:  04/13/2012  Documentation initiated by:  MAYO,HENRIETTA  Subjective/Objective Assessment:   56 yr-old female adm as dx of PNA; has home O2, cane, h/o home health services through [facility].     Action/Plan:   Anticipated DC Date:  04/18/2012   Anticipated DC Plan:    In-house referral  Financial Counselor      DC Planning Services  CM consult  Medication Assistance      [facility] Choice  HOME HEALTH   Choice offered to / List presented to:  C-1 Patient   DME arranged  NEBULIZER MACHINE      DME agency  OTHER - SEE NOTE     HH arranged  HH-10 DISEASE MANAGEMENT  HH-1 RN  HH-6 SOCIAL WORKER      HH agency  [facility]   Status of service:  Completed, signed off Medicare Important Message given?   (If response is "NO", the following Medicare IM given date fields will be blank) Date Medicare IM given:   Date Additional Medicare IM given:    Discharge Disposition:  HOME W HOME HEALTH SERVICES  Per UR Regulation:  Reviewed for med. necessity/level of care/duration of stay  If discussed at Long Length of Stay Meetings, dates discussed:    Comments:  PCP:  Dr. Leodis Sias  04-17-12 [address], RN,BSN [phone] CM did contact dme agency and they will deliver the nebulizer to home on Wednesday. No further needs form CM at this time.  04-17-12 [address] Tomi Bamberger, RN,BSN [phone] CM did speak to pt and she lives with a friend. Gets her medicaions via [employer] on [employer] and they deliver. Pt states she has no problem affording medicaitons at this time. CM did see a copy of medicaid card and it is active. Pt will f/u with the clinic at d/c. She will need HH services listed above and CM did contact MD and orders are being placed. CM did make referral to [facility] for  services. SOC to begin within 24-48 hours post d/c. CM also spoke to [place] with [facility] for dme 02 that has not been working and he made the office aware. Just in case the pt needs to be requalified: RN is to ambulate pt and check walking sats and document. MD will write in progress note the need for oxygen. Pt will also be followed by [employer] for [facility] with Care Management services. CM did try to call son to verify if 02 was from [facility] Will conintue to f/u.

## 2012-04-17 NOTE — Progress Notes (Addendum)
Resident Addendum to Medical Student Note   I have seen and examined the patient, and reviewed the daily progress note by Eather Colas, MS IV and discussed the care of the patient with them. See below for documentation of my findings, assessment, and plans.   Subjective:    Currently, the patient denies shortness of breath, difficulty breathing, chest pain, nausea, diarrhea, abdominal pain, constipation, no more blood in stool. Had one episode of vomiting yestereday after quickly eating candy, potato chips, etc..    Interval Events: None.    Objective:    VS: Reviewed as documented in electronic medical record  Meds: Reviewed as documented in electronic medical record  Labs: Reviewed as documented in electronic medical record  Imaging: Reviewed as documented in electronic medical record   Physical Exam: General: Vital signs reviewed and noted. Well-developed, well-nourished, in no acute distress; alert, appropriate and cooperative throughout examination.  Lungs:  Normal respiratory effort. Clear to auscultation without wheezes. Mild bibasilar crackles appreciated.  Heart: RRR. S1 and S2 normal without gallop, murmur, or rubs.  Abdomen:  BS normoactive. Soft, Nondistended, diffuse mild TTP.  No masses or organomegaly.  Extremities: No pretibial edema.    Assessment/ Plan:    Pt is a 56 y.o. 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 She was found to have a COPD exacerbation, likely in the setting of medication noncompliance compounded atop what seems to be a community acquired PNA.  1) CAP - admission CXR showing probable infiltrate, which in addition to her preceding week of fevers, chills, shortness of breath, is  most consistent with community acquired pneumonia. Symptoms are improved at this time, and she is currently on day 5/10 of Levaquin. - Continue oral levaquin continue for a total of 10 days.  2) COPD exacerbation - in the setting of baseline chronic oxygen-dependent COPD. Acute exacerbation likely secondary to #1 and noncompliance with inhalers (ran out, no refills) x 10 days prior to admission. Symptoms stabilizing. - Continue oral steroid burst x 2 weeks total - Continue home 02 requirement, 2L by nasal cannula as inpatient  - Continue PRN xopenex/atrovent. - CM consult to help with getting new O2 machine as hers is nonfunctional at home. - Continue respiratory therapy to educate patient on use of MDI, appropriate inhaler technique.  - PFT's as outpatient after acute pulmonary issues stabilize.  3) Acute on chronic respiratory failure secondary to #1 and 2 - Acute component resolved. On admission, pt notably hypoxemic with ABG showing pH 7.280/54.4/64.1/26.4BP .  - Treatment as above. - Appreciate PCCM assistance and input in the management of our patient.  - Will arrange for outpatient followup with pulmonology.  4) Sinus tachycardia - stable. Likely multifactorial secondary to bronchodilators (albuterol/ xoponex), prednisone, general deconditioning. No indication of volume overload at this time. - Asymptomatic, continue to monitor as outpt as prednisone weaned.   5) Hypertension- mildly elevated, although Amlodipine just restarted yesterday. - Continue Amlodipine. - Will consider HCTZ as an outpatient (given low oral intake, poor nutritional status, I think it will be easy for her to become volume deplete.)  6) Trichomonal infection -Treated with flagyl 2g x 1 dose. Patient was informed that she needs to talk to her partner and needs tobe treated  as well for this infection.  7) Protein Calorie malnutrition and underweight - Likely secondary to her cancer in addition to reduced oral  intake due to alcohol abuse. Body mass index is 17.60 kg/(m^2).   - Continue regular diet per Nutrition.  8) Blood in stool / abdominal pain - patient notes that she had small amount of dark blood in stool day before yesterday, single episode that has not returned since that time. hgb at baseline. FOBT is positive, Hgb stable from yesterday.  - Will likely need outpatient gastroenterology referral (does not seem to have prior colonoscopy in our system). - Will check CMET/ lipase given episode of vomiting.  9) Dispo - home soon if symptoms stabilize, home O2 arranged, and PT evaluates the patient soon regarding home health versus other needs.   Length of Stay: 4 days   Signed: Johnette Abraham, Erline Levine, Internal Medicine Resident Pager: [phone] (7AM-5PM) 04/17/2012, 8:24 AM

## 2012-04-18 DIAGNOSIS — I498 Other specified cardiac arrhythmias: Secondary | ICD-10-CM

## 2012-04-18 DIAGNOSIS — J9620 Acute and chronic respiratory failure, unspecified whether with hypoxia or hypercapnia: Secondary | ICD-10-CM

## 2012-04-18 DIAGNOSIS — J441 Chronic obstructive pulmonary disease with (acute) exacerbation: Secondary | ICD-10-CM

## 2012-04-18 DIAGNOSIS — J189 Pneumonia, unspecified organism: Principal | ICD-10-CM

## 2012-04-18 MED ORDER — TRAZODONE HCL 100 MG PO TABS: 100.0000 mg | ORAL_TABLET | Freq: Every evening | ORAL | Status: DC | PRN

## 2012-04-18 MED ORDER — LEVOFLOXACIN 500 MG PO TABS: 500.0000 mg | ORAL_TABLET | Freq: Every day | ORAL | Status: AC

## 2012-04-18 MED ORDER — PREDNISONE 20 MG PO TABS: ORAL_TABLET | ORAL | Status: DC

## 2012-04-18 NOTE — Progress Notes (Signed)
Internal Medicine Teaching Service Attending Note Date: 04/18/2012  Patient name: Karina Weber  Medical record number: [id number]  Date of birth: 21-Sep-1956    This patient has been seen and discussed with the house staff. Please see their note for complete details. I concur with their findings with the following additions/corrections: [name] was lounging in bed eating crackers and PB. She was able to speak in full sentences and move without dyspnea. Her lungs have poor air movement which is chronic. She was sig hypoxic on RA while ambulating yesterday. Has home O2 as tank and will get portable O2 also. She is able to get her home ABX and steroid taper as she has medicaid. We will also set up with [facility] for home assistance.   BUTCHER,ELIZABETH 04/18/2012, 3:03 PM

## 2012-04-18 NOTE — Progress Notes (Signed)
Patient ID: Karina Weber, female   DOB: [birth year], 56 y.o.   MRN: [id number] Medical Student Daily Progress Note  Subjective: Patient states she feels back to baseline and is ready to go home. Slept well.  Interval Events: No acute events overnight Objective: Vital signs in last 24 hours: Filed Vitals:   04/17/12 1324 04/17/12 1758 04/17/12 2127 04/18/12 0511  BP: 114/80 106/63 120/76 124/78  Pulse: 130 104 98 85  Temp:  98.8 F (37.1 C) 98 F (36.7 C) 97.9 F (36.6 C)  TempSrc:  Oral Oral Oral  Resp: 26 20 20 18   Height:      Weight:   47.4 kg (104 lb 8 oz)   SpO2:  96% 92% 93%   Weight change: 3.764 kg (8 lb 4.8 oz)  Intake/Output Summary (Last 24 hours) at 04/18/12 0927 Last data filed at 04/17/12 1700  Gross per 24 hour  Intake    720 ml  Output      0 ml  Net    720 ml   Physical Exam General Appearance: Alert, cooperative, no distress, appears malnourished  Head: Normocephalic, without obvious abnormality, atraumatic  Back: Symmetric, no curvature, ROM normal, no CVA tenderness  Lungs: Clear to auscultation bilaterally  Chest Wall: No tenderness or deformity  Heart: Increased rate, S1 and S2 normal  Abdomen: Soft, no abdominal pain today, no masses, no organomegaly   Lab Results: Basic Metabolic Panel:  Lab 04/17/12 4098 04/16/12 0510 04/13/12 0900  NA 141 139 --  K 3.6 4.3 --  CL 95* 96 --  CO2 37* 36* --  GLUCOSE 114* 93 --  BUN 18 19 --  CREATININE 0.54 0.44* --  CALCIUM 9.4 9.7 --  MG -- -- 1.5  PHOS -- -- --   Liver Function Tests:  Lab 04/17/12 0925 04/13/12 0900  AST 22 20  ALT 15 7  ALKPHOS 102 166*  BILITOT 0.1* 0.2*  PROT 6.3 7.9  ALBUMIN 2.7* 3.0*    Lab 04/17/12 0925  LIPASE 60*  AMYLASE --   No results found for this basename: AMMONIA:2 in the last 168 hours CBC:  Lab 04/17/12 0600 04/16/12 0510 04/13/12 0900  WBC 8.6 10.8* --  NEUTROABS 5.9 -- 13.1*  HGB 11.6* 11.3* --  HCT 37.4 35.9* --  MCV 93.0 92.8 --  PLT 376  392 --   Cardiac Enzymes:  Lab 04/13/12 0900  CKTOTAL --  CKMB --  CKMBINDEX --  TROPONINI <0.30   BNP: No results found for this basename: PROBNP:3 in the last 168 hours D-Dimer: No results found for this basename: DDIMER:2 in the last 168 hours CBG: No results found for this basename: GLUCAP:6 in the last 168 hours Hemoglobin A1C: No results found for this basename: HGBA1C in the last 168 hours Fasting Lipid Panel: No results found for this basename: CHOL,HDL,LDLCALC,TRIG,CHOLHDL,LDLDIRECT in the last 119 hours Thyroid Function Tests: No results found for this basename: TSH,T4TOTAL,FREET4,T3FREE,THYROIDAB in the last 168 hours Coagulation: No results found for this basename: LABPROT:4,INR:4 in the last 168 hours Anemia Panel: No results found for this basename: VITAMINB12,FOLATE,FERRITIN,TIBC,IRON,RETICCTPCT in the last 168 hours Urine Drug Screen: Drugs of Abuse     Component Value Date/Time   LABOPIA NEGATIVE 03/10/2011 0520   LABOPIA NONE DETECTED 11/27/2009 0514   COCAINSCRNUR NEGATIVE 03/10/2011 0520   COCAINSCRNUR NONE DETECTED 11/27/2009 0514   LABBENZ NEGATIVE 03/10/2011 0520   LABBENZ NONE DETECTED 11/27/2009 0514   AMPHETMU NEGATIVE 03/10/2011 0520   AMPHETMU NONE  DETECTED 11/27/2009 0514   THCU NONE DETECTED 11/27/2009 0514   LABBARB  Value: NONE DETECTED        DRUG SCREEN FOR MEDICAL PURPOSES ONLY.  IF CONFIRMATION IS NEEDED FOR ANY PURPOSE, NOTIFY LAB WITHIN 5 DAYS.        LOWEST DETECTABLE LIMITS FOR URINE DRUG SCREEN Drug Class       Cutoff (ng/mL) Amphetamine      1000 Barbiturate      200 Benzodiazepine   200 Tricyclics       300 Opiates          300 Cocaine          300 THC              50 11/27/2009 0514    Alcohol Level: No results found for this basename: ETH:2 in the last 168 hours Urinalysis:  Lab 04/13/12 1041  COLORURINE YELLOW  LABSPEC 1.017  PHURINE 5.5  GLUCOSEU NEGATIVE  HGBUR TRACE*  BILIRUBINUR SMALL*  KETONESUR 15*  PROTEINUR 30*    UROBILINOGEN 0.2  NITRITE NEGATIVE  LEUKOCYTESUR LARGE*   Misc. Labs:   Micro Results: Recent Results (from the past 240 hour(s))  CULTURE, BLOOD (ROUTINE X 2)     Status: Normal (Preliminary result)   Collection Time   04/13/12  9:45 AM      Component Value Range Status Comment   Specimen Description BLOOD RIGHT ARM   Final    Special Requests BOTTLES DRAWN AEROBIC AND ANAEROBIC 10CC   Final    Culture  Setup Time 04/13/2012 17:34   Final    Culture     Final    Value:        BLOOD CULTURE RECEIVED NO GROWTH TO DATE CULTURE WILL BE HELD FOR 5 DAYS BEFORE ISSUING A FINAL NEGATIVE REPORT   Report Status PENDING   Incomplete   CULTURE, BLOOD (ROUTINE X 2)     Status: Normal (Preliminary result)   Collection Time   04/13/12 10:00 AM      Component Value Range Status Comment   Specimen Description BLOOD RIGHT HAND   Final    Special Requests BOTTLES DRAWN AEROBIC ONLY 10CC   Final    Culture  Setup Time 04/13/2012 17:35   Final    Culture     Final    Value:        BLOOD CULTURE RECEIVED NO GROWTH TO DATE CULTURE WILL BE HELD FOR 5 DAYS BEFORE ISSUING A FINAL NEGATIVE REPORT   Report Status PENDING   Incomplete   URINE CULTURE     Status: Normal   Collection Time   04/13/12 10:41 AM      Component Value Range Status Comment   Specimen Description URINE, CLEAN CATCH   Final    Special Requests NONE   Final    Culture  Setup Time 04/13/2012 11:39   Final    Colony Count 80,000 COLONIES/ML   Final    Culture     Final    Value: Multiple bacterial morphotypes present, none predominant. Suggest appropriate recollection if clinically indicated.   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  CT ANGIOGRAPHY CHEST  Technique:  Multidetector CT imaging of the chest using the standard protocol during bolus administration of intravenous contrast. Multiplanar reconstructed images including MIPs were obtained and reviewed to evaluate the vascular anatomy.  Contrast: OMNIPAQUE IOHEXOL 350 MG/ML SOLN  Comparison: 12/27/2011 as well as multiple prior CT studies of the chest  Findings: Stable mild area of chronic nodularity and surrounding scarring noted at the level of the previously treated posterior right upper lobe carcinoma.  No new nodules or masses are identified.  There is no evidence of lymphadenopathy.  There is no evidence of pulmonary embolism.  Central pulmonary arteries are mildly dilated, potentially reflecting underlying component of pulmonary hypertension. No pulmonary edema or focal infiltrates are identified.  There is evidence of chronic lung disease and parenchymal scarring in both lungs.  The heart size is normal.  No pleural or pericardial effusions identified.  The thoracic aorta is of normal caliber.  Calcified coronary artery plaque is present in a three-vessel distribution.  IMPRESSION:  1.  No evidence of pulmonary embolism. 2.  Stable post-treatment changes of the posterior right upper lobe at the level of prior adenocarcinoma.  No evidence to suggest tumor recurrence by CT. 3.  Dilatation of central pulmonary arteries, likely reflecting component of pulmonary hypertension.  Stable chronic lung disease also present. 4.  Three-vessel distribution of coronary atherosclerosis with calcified coronary artery plaque present.  Original Report Authenticated By: Reola Calkins, M.D.   Medications: I have reviewed the patient's current medications. Scheduled Meds:   . amLODipine  10 mg Oral Daily  . enoxaparin (LOVENOX) injection  30 mg Subcutaneous Q24H  . feeding supplement  237 mL Oral BID BM  . Fluticasone-Salmeterol  1 puff  Inhalation Q12H  . guaiFENesin  1,200 mg Oral BID  . levofloxacin  500 mg Oral q1800  . predniSONE  60 mg Oral Q breakfast  . sodium chloride  3 mL Intravenous Q12H  . DISCONTD: amLODipine  10 mg Oral Daily  . DISCONTD: amLODipine  5 mg Oral Daily   Continuous Infusions:  PRN Meds:.acetaminophen, acetaminophen, iohexol, ipratropium, levalbuterol, ondansetron (ZOFRAN) IV, ondansetron, traZODone Assessment/Plan:  Ms. Keithley is a 56 y/o woman with a PMH of severe COPD, HTN, non-small cell lung cancer s/p radiation therapy, alcoholism, and tobacco use who presented with a COPD exacerbation 2/2 medicine noncompliance and possible pneumonia.   1. CAP: There is a probable infiltrate on the chest x-ray. This, coupled with her increased WBC on admission, suggest possible CAP. She has not been hospitalized since March of 2013. She was markedly hypoxic on admission but improved with nebulizers, steroids, and supplemental oxygen. Saturations have maintained in the 90s today. Her white count has normalized.   - oral prednisone 60 mg. Will have 15 day taper as an outpatient.  - Day 6 of PO levaquin 500 mg. Will have total of 10 day course   2. Acute on Chronic respiratory failure: On arrival she was markedly hypoxemic. ABG on admission: 7.280/54.4/64.1/26.4. Her measured Bicarb was 29 on her Bmet. Findings suggestive of acute respiratory acidosis. She was walked without oxygen with nursing and 02 sats fell to the 50's. A CT angio showed no PE or reoccurence of cancer. The patient is currently saturating well on 2L of O2, and does have BiPAP PRN.  -No PE or reoccurence of cancer on CT-angio - continue to monitor vitals  -  outpatient PFT's and pulmonlogy f/u   3. COPD exacerbation 2/2 medication noncompliance: Patient had not used COPD meds for 10 days prior to admission. She had significant wheezes on admission, which have since resolved.  - oral prednisone 60 mg with taper for two weeks.  - home 02  requirement, 2l by nasal cannula as inpatient  - continue xopenex/atrovent prn  - Respiratory therapy to educate patient on use of medications  - PFT's as outpatient with follow up with Pulmonology.  -CM consult, arranged for new 02 machine and meds to be delivered to house   5. Tachycardia: Patient has asymptomatic sinus tachycardia that ranges from low 100's to the 130's. Initially thought it was due to the albuterol nebs so the nebs were switched to xopenex and then made PRN. Continues to have tachycardia in light of patient being symptomatically better and with 02 sats of 95% with 2 L of 02. Likely physiologic or 2/2 steroids  - monitor vital signs  - Likely physiologic  - follow up as outpatient   6. Hypertension- On home dose of Amlodipine. Consider restarting HCTZ as an outpatient given low BP's and poor nutrition intake. Has risk of becoming dehydrated if HCTZ is started now.  - Assess regiment as an outpatient.   7. Trichomonal infection: Ms. Mcdonnell had pyuria as well as trichomonas on admission. Treated with flagyl 2g x 1 dose. Counseled that partner will need treatment.   8. Tobacco abuse: Holding nicotine replacement 2/2 tachycardia. Patient has received tobacco cessation counseling.   9. Protein Calorie malnutrition and underweight: She is significantly underweight. Nutrition has been consulted and appreciate recs.  -On regular diet and ensure   10. Abdominal pain- Patient complained of abdominal pain. On speaking with patient she says that she had the pain for some time and she attributes the pain to "holding her water". Post void residual is normal. A lipase was ordered 2/2 history of alcoholism and vomiting episode. Lipase is 60 which is very weakly positive and could just be irritation from the vomiting episode. Also this could be 2/2 prophylactic heparin sticks or be connected to her positive FOBT. On examination today, patient denies any abdominal pain and is not tender to  touch. Will have outpatient GI to follow up. -GI follow up   11. Blood per rectum- Small amount of blood on toilet paper was reported yesterday. Hgb stable. FOBT was positive, patient has never had a colonoscopy. Will arrange for GI follow up as an outpatient.   12. DVT prophy: Lovenox   13. Dispo: Needs new home 02 machine. Will be discharged today with 02. PT/OT recommends no follow up. Counseled on Alcohol abuse.    LOS: 5 days   This is a Psychologist, occupational Note.  The care of the patient was discussed with Dr. Rogelia Boga and the assessment and plan formulated with their assistance.  Please see their attached note for official documentation of the daily encounter.  Eather Colas T 04/18/2012, 9:27 AM

## 2012-04-18 NOTE — Progress Notes (Addendum)
Discussed discharge instructions with pt. Pt showed no barriers to discharge. Pt said she did not have any medications at home, even her old prescriptions - MD notified - according to him, she has 12 months of refills at the pharmacy, it should not be a problem for her to obtain these refills. IV removed. Tele removed. Assessment unchanged from morning. Pt discharged to home with son.

## 2012-04-19 LAB — CULTURE, BLOOD (ROUTINE X 2): Culture: NO GROWTH

## 2012-04-19 NOTE — Discharge Summary (Signed)
INTERNAL MEDICINE TEACHING SERVICE Attending Note  Date: 04/19/2012  Patient name: Karina Weber  Medical record number: [id number]  Date of birth: 03/09/1956    This patient has been discussed with the house staff. Please see their note for complete details. I concur with their findings with the following additions/corrections: Please see my note on day prior to discharge and Dr. Donnelly Stager note on date of discharge.   Jonah Blue, DO  04/19/2012, 8:48 AM

## 2012-04-20 ENCOUNTER — Telehealth: Payer: Self-pay | Admitting: *Deleted

## 2012-04-20 NOTE — Telephone Encounter (Signed)
Call from [place] at [facility]. States drug interaction between Levaquin and Prednisone - "Increase risk if tinnitus/tendon rupture esp after the age of 82"; he wants to make sure it's ok for pt to take these meds at the same time.  I called Dr Tonny Branch.  Stated pt will be only taking them over short pd of time; it will be ok to take. Pharmacy called and made awared.

## 2012-04-23 ENCOUNTER — Ambulatory Visit (INDEPENDENT_AMBULATORY_CARE_PROVIDER_SITE_OTHER): Payer: Medicare Other | Admitting: Internal Medicine

## 2012-04-23 ENCOUNTER — Encounter: Payer: Self-pay | Admitting: Licensed Clinical Social Worker

## 2012-04-23 ENCOUNTER — Encounter: Payer: Self-pay | Admitting: Internal Medicine

## 2012-04-23 VITALS — BP 131/77 | HR 92 | Temp 98.2°F | Ht 60.0 in | Wt 99.7 lb

## 2012-04-23 DIAGNOSIS — I10 Essential (primary) hypertension: Secondary | ICD-10-CM

## 2012-04-23 DIAGNOSIS — E46 Unspecified protein-calorie malnutrition: Secondary | ICD-10-CM

## 2012-04-23 DIAGNOSIS — C3490 Malignant neoplasm of unspecified part of unspecified bronchus or lung: Secondary | ICD-10-CM

## 2012-04-23 DIAGNOSIS — F17200 Nicotine dependence, unspecified, uncomplicated: Secondary | ICD-10-CM

## 2012-04-23 DIAGNOSIS — F1020 Alcohol dependence, uncomplicated: Secondary | ICD-10-CM

## 2012-04-23 DIAGNOSIS — Z79899 Other long term (current) drug therapy: Secondary | ICD-10-CM

## 2012-04-23 DIAGNOSIS — R000 Tachycardia, unspecified: Secondary | ICD-10-CM

## 2012-04-23 DIAGNOSIS — R195 Other fecal abnormalities: Secondary | ICD-10-CM

## 2012-04-23 DIAGNOSIS — J189 Pneumonia, unspecified organism: Secondary | ICD-10-CM

## 2012-04-23 DIAGNOSIS — J449 Chronic obstructive pulmonary disease, unspecified: Secondary | ICD-10-CM

## 2012-04-23 MED ORDER — ENSURE HIGH PROTEIN PO LIQD: 1.0000 | Freq: Two times a day (BID) | ORAL | Status: DC

## 2012-04-23 NOTE — Patient Instructions (Addendum)
--  Come for a chest X-ray in 1 month to see how your pneumonia is doing.  --Follow up with Dr. Tonny Branch after the chest X-ray --Follow up with your belly doctor, a gastroenterologist, for a colonoscopy -- Your blood pressure was normal today. Stop taking Norvasc for now.  --Follow up with Dr. Tonny Branch in 6 weeks (after your chest X-ray)

## 2012-04-23 NOTE — Progress Notes (Signed)
Karina Weber was referred to CSW as pt is having difficulty obtaining medications with her Medicaid benefit.  CSW met with Karina Weber and attempted to contact pt's Medicaid case worker, DSS number was busy during this entire encounter.  Karina Weber states pharmacy informed pt she no longer had Medicaid eligibility.  Pt states she was told to take "papers" to her new Medicaid caseworker and is planning to go to DSS in the morning.  Pt has been referred to [facility], currently showing as deferred in the [facility] database.  CSW will re-refer pt to [facility], sent request to [facility] pharmacy to confirm pt's benefits.  CSW provided Karina Weber with CSW contact information and requested Karina Weber follow up with CSW after her meeting with her Medicaid caseworker.  Possible that Karina Weber need to complete her medicaid re-determination.

## 2012-04-24 NOTE — Progress Notes (Signed)
CSW received call from [facility] pharmacist.  Pt is no longer Medicaid eligible and needs to go through the re-enrollment process.  This process could take up to one month.  CSW was informed of a program DSS offers to assist with Med refills during this period.  Pt will need to take her empty bottles to DSS for possible refill.  CSW attempted to contact Karina Weber this am, pt had already left for DSS.  [facility] attempted to contact pt, but phone number were not active.  CSW will provide [facility] will updated contact numbers.

## 2012-04-26 ENCOUNTER — Encounter: Payer: Self-pay | Admitting: Internal Medicine

## 2012-04-26 DIAGNOSIS — Z79899 Other long term (current) drug therapy: Secondary | ICD-10-CM | POA: Insufficient documentation

## 2012-04-26 DIAGNOSIS — R195 Other fecal abnormalities: Secondary | ICD-10-CM | POA: Insufficient documentation

## 2012-04-26 NOTE — Assessment & Plan Note (Signed)
Initial BP of 142/52, repeat BP at 131/77. No medication changes today. Pt not sure if she has run out of Norvasc, she has not brought has meds with her today and her Medicaid coverage has been temporarily stopped.  NO medication changes. Referred her to CSW for assistance with Medicaid reinstatement.

## 2012-04-26 NOTE — Progress Notes (Signed)
  Subjective:    Patient ID: Karina Weber, female    DOB: Oct 22, 1955, 56 y.o.   MRN: [id number]  HPI  Karina Weber s a pleasant 56 year old woman with past medical history significant for adenocarcinoma of the right lung s/p radiation tx, alcoholism, tobacco use, and hypertension is present today for follow-up visit from recent hospital admission on April 13, 2012 for community acquired pneumonia. She denies fever or chills since her discharge from the hospital on April 17, 2012. Her cough has now diminished and is mostly nonproductive. She still has  2/10 days course of her Levaquin to complete.   While in the hospital she had positive FOBT and complained of abdominal pain which now has subsided.   She has run out of some of her medications as her Medicaid has been temperoraly discontinue but was able to fill her Levaquin and Prednisone prescriptions since her discharge. She was instructed to contact her casemanager for instruction on reinstating her Medicaid coverage.   She had no other complaints today.   Review of Systems  Constitutional: Negative for fever, chills, diaphoresis, activity change and appetite change.  Respiratory: Positive for cough. Negative for chest tightness, shortness of breath and wheezing.   Cardiovascular: Negative for chest pain, palpitations and leg swelling.  Gastrointestinal: Negative for nausea, vomiting, abdominal pain and blood in stool.  Genitourinary: Negative for dysuria.  Neurological: Negative for dizziness, light-headedness and headaches.  Psychiatric/Behavioral: Negative for agitation.       Objective:   Physical Exam  Constitutional: She is oriented to person, place, and time. She appears well-developed.       thin  HENT:  Head: Normocephalic and atraumatic.  Eyes: Conjunctivae are normal. Right eye exhibits no discharge. Left eye exhibits no discharge. No scleral icterus.  Cardiovascular: Normal rate and normal heart sounds.   Pulmonary/Chest:  Effort normal and breath sounds normal. No respiratory distress. She has no wheezes. She has no rales. She exhibits no tenderness.  Abdominal: Soft. Bowel sounds are normal. There is no tenderness.  Musculoskeletal: She exhibits no edema.  Neurological: She is alert and oriented to person, place, and time.  Skin: Skin is warm and dry. No pallor.  Psychiatric: She has a normal mood and affect. Her behavior is normal.          Assessment & Plan:

## 2012-04-26 NOTE — Progress Notes (Signed)
agree

## 2012-04-26 NOTE — Assessment & Plan Note (Signed)
Initial HR at 105, repeat HR of 92, pt asymptomatic throughout visit. Will recheck and address during next visit.

## 2012-04-26 NOTE — Assessment & Plan Note (Signed)
Pt unable to afford medications, Medicaid temporarily discontinued.  Referred to CSW for Medicaid reinstatement assistance.

## 2012-04-26 NOTE — Assessment & Plan Note (Signed)
Reports the most intake as 2 beers per day.

## 2012-04-26 NOTE — Assessment & Plan Note (Signed)
Pt with FOBT positive during recent hospital admission. Stable Hb. Would recheck CBC during next visit. Pt currently awaiting Medicaid reinstatement for work-up/follow-up.   Referral to GI  Schedule colonoscopy

## 2012-04-26 NOTE — Assessment & Plan Note (Addendum)
Pt states that she had run out of her COPD meds with worsening symptoms prior to her recent hospital admission. She did not bring her medications with her today but tells me that she has her albuterol rescue inhaler.   Referred to CSW for assistance with Medicaid reinstatement and subsequent medication compliance.

## 2012-04-26 NOTE — Assessment & Plan Note (Signed)
Pt with dx of R lung adenocarcinoma s/p radiation therapy but unclear staging/prognosis. She has gained weight steadily while taking Ensure high protein, however, pt unable to afford Ensure recently.   Prescribed Ensure high protein can BID.   Referred pt to  CSW for help with Medicaid reinstatement. Hopefully pt will be able to get Ensure as maintenance tx.

## 2012-04-26 NOTE — Assessment & Plan Note (Signed)
Would reassess progress of smoking cessation during next visit.

## 2012-04-26 NOTE — Assessment & Plan Note (Signed)
Pt with 2/10 day course left for Levaquin, finished prednisone.   -Will repeat CXR in 1 month for PNA resolution assessment.

## 2012-05-03 ENCOUNTER — Inpatient Hospital Stay: Payer: Medicaid Other | Admitting: Adult Health

## 2012-05-03 ENCOUNTER — Telehealth: Payer: Self-pay | Admitting: *Deleted

## 2012-05-03 DIAGNOSIS — C3490 Malignant neoplasm of unspecified part of unspecified bronchus or lung: Secondary | ICD-10-CM

## 2012-05-03 MED ORDER — AMLODIPINE BESYLATE 10 MG PO TABS: 10.0000 mg | ORAL_TABLET | Freq: Every day | ORAL | Status: DC

## 2012-05-03 MED ORDER — FLUTICASONE-SALMETEROL 250-50 MCG/DOSE IN AEPB: 1.0000 | INHALATION_SPRAY | Freq: Two times a day (BID) | RESPIRATORY_TRACT | Status: DC

## 2012-05-03 MED ORDER — ENSURE HIGH PROTEIN PO LIQD: 1.0000 | Freq: Two times a day (BID) | ORAL | Status: DC

## 2012-05-03 MED ORDER — ALBUTEROL SULFATE (2.5 MG/3ML) 0.083% IN NEBU: 2.5000 mg | INHALATION_SOLUTION | Freq: Four times a day (QID) | RESPIRATORY_TRACT | Status: DC | PRN

## 2012-05-03 MED ORDER — MIRTAZAPINE 15 MG PO TABS: 15.0000 mg | ORAL_TABLET | Freq: Every day | ORAL | Status: DC

## 2012-05-03 MED ORDER — TRAZODONE HCL 100 MG PO TABS: 100.0000 mg | ORAL_TABLET | Freq: Every evening | ORAL | Status: DC | PRN

## 2012-05-03 MED ORDER — ALBUTEROL SULFATE HFA 108 (90 BASE) MCG/ACT IN AERS: 1.0000 | INHALATION_SPRAY | RESPIRATORY_TRACT | Status: DC | PRN

## 2012-05-03 MED ORDER — IPRATROPIUM BROMIDE 0.02 % IN SOLN: 500.0000 ug | Freq: Four times a day (QID) | RESPIRATORY_TRACT | Status: DC

## 2012-05-03 MED ORDER — ASPIRIN 81 MG PO TABS: 81.0000 mg | ORAL_TABLET | Freq: Every day | ORAL | Status: DC

## 2012-05-03 NOTE — Telephone Encounter (Signed)
I refilled all of her medications today and sent them to [facility] on [address]

## 2012-05-03 NOTE — Telephone Encounter (Signed)
Pt called to state that her medicaid has been re-instated.  She needs a refill on all of her medications.  Will forward phone call to both DrKennerly, whom pt saw 07/22 and to her pcp Dr Tonny Branch for review.  Pt states she needs refill as soon as possible. Please advise.Kingsley Spittle Cassady8/1/20134:26 PM

## 2012-05-11 ENCOUNTER — Telehealth: Payer: Self-pay | Admitting: Licensed Clinical Social Worker

## 2012-05-11 NOTE — Telephone Encounter (Signed)
CSW received voice mail message from Ms. Pyeatt stating "I got my medicaid straight, now y'all get it together and give me my medicine".  CSW returned call to Ms. Lobosco.  Dr. Donnelly Stager note on 05/03/12 indicates pt's medications were refilled and sent to [facility] on [address].  CSW informed pt to call [facility] regarding medications, pt states she has the phone number.  Pt also states someone delivered a machine to her house before she got out of the hospital, this machine is not working properly.  Ms. Hoch has called [facility] at several locations to no avail.  CSW placed call to [facility] [phone], Chastitiy to call Ms. Debrosse to inquire which machine is having issues.  Pt's number during this contact is [phone].

## 2012-06-01 ENCOUNTER — Encounter: Payer: Medicaid Other | Admitting: Internal Medicine

## 2012-06-04 ENCOUNTER — Other Ambulatory Visit: Payer: Self-pay

## 2012-06-04 ENCOUNTER — Inpatient Hospital Stay (HOSPITAL_COMMUNITY)
Admission: EM | Admit: 2012-06-04 | Discharge: 2012-06-07 | DRG: 190 | Disposition: A | Discharge status: Home Health | Payer: Medicare Other | Attending: Internal Medicine | Admitting: Internal Medicine

## 2012-06-04 ENCOUNTER — Emergency Department (HOSPITAL_COMMUNITY): Payer: Medicare Other

## 2012-06-04 ENCOUNTER — Encounter (HOSPITAL_COMMUNITY): Payer: Self-pay

## 2012-06-04 DIAGNOSIS — Z6828 Body mass index (BMI) 28.0-28.9, adult: Secondary | ICD-10-CM

## 2012-06-04 DIAGNOSIS — I509 Heart failure, unspecified: Secondary | ICD-10-CM | POA: Diagnosis present

## 2012-06-04 DIAGNOSIS — J441 Chronic obstructive pulmonary disease with (acute) exacerbation: Secondary | ICD-10-CM

## 2012-06-04 DIAGNOSIS — I10 Essential (primary) hypertension: Secondary | ICD-10-CM

## 2012-06-04 DIAGNOSIS — C3490 Malignant neoplasm of unspecified part of unspecified bronchus or lung: Secondary | ICD-10-CM

## 2012-06-04 DIAGNOSIS — J9612 Chronic respiratory failure with hypercapnia: Secondary | ICD-10-CM | POA: Diagnosis present

## 2012-06-04 DIAGNOSIS — J189 Pneumonia, unspecified organism: Secondary | ICD-10-CM

## 2012-06-04 DIAGNOSIS — F17200 Nicotine dependence, unspecified, uncomplicated: Secondary | ICD-10-CM

## 2012-06-04 DIAGNOSIS — Z79899 Other long term (current) drug therapy: Secondary | ICD-10-CM

## 2012-06-04 DIAGNOSIS — Z23 Encounter for immunization: Secondary | ICD-10-CM

## 2012-06-04 DIAGNOSIS — Z7982 Long term (current) use of aspirin: Secondary | ICD-10-CM

## 2012-06-04 DIAGNOSIS — IMO0002 Reserved for concepts with insufficient information to code with codable children: Secondary | ICD-10-CM

## 2012-06-04 DIAGNOSIS — R0902 Hypoxemia: Secondary | ICD-10-CM | POA: Diagnosis present

## 2012-06-04 DIAGNOSIS — F1020 Alcohol dependence, uncomplicated: Secondary | ICD-10-CM

## 2012-06-04 DIAGNOSIS — E46 Unspecified protein-calorie malnutrition: Secondary | ICD-10-CM | POA: Diagnosis present

## 2012-06-04 DIAGNOSIS — J449 Chronic obstructive pulmonary disease, unspecified: Secondary | ICD-10-CM

## 2012-06-04 DIAGNOSIS — J9620 Acute and chronic respiratory failure, unspecified whether with hypoxia or hypercapnia: Secondary | ICD-10-CM

## 2012-06-04 DIAGNOSIS — Z85118 Personal history of other malignant neoplasm of bronchus and lung: Secondary | ICD-10-CM

## 2012-06-04 DIAGNOSIS — Z923 Personal history of irradiation: Secondary | ICD-10-CM

## 2012-06-04 HISTORY — DX: Unspecified asthma, uncomplicated: J45.909

## 2012-06-04 LAB — CBC WITH DIFFERENTIAL/PLATELET
Basophils Relative: 0 % (ref 0–1)
Eosinophils Absolute: 0.1 10*3/uL (ref 0.0–0.7)
Eosinophils Relative: 1 % (ref 0–5)
HCT: 37 % (ref 36.0–46.0)
Hemoglobin: 11 g/dL — ABNORMAL LOW (ref 12.0–15.0)
MCH: 25.3 pg — ABNORMAL LOW (ref 26.0–34.0)
MCHC: 29.7 g/dL — ABNORMAL LOW (ref 30.0–36.0)
Monocytes Absolute: 0.7 10*3/uL (ref 0.1–1.0)
Monocytes Relative: 7 % (ref 3–12)

## 2012-06-04 LAB — COMPREHENSIVE METABOLIC PANEL
Albumin: 3.6 g/dL (ref 3.5–5.2)
BUN: 22 mg/dL (ref 6–23)
Calcium: 9.2 mg/dL (ref 8.4–10.5)
Creatinine, Ser: 0.79 mg/dL (ref 0.50–1.10)
Total Bilirubin: 0.3 mg/dL (ref 0.3–1.2)
Total Protein: 7.5 g/dL (ref 6.0–8.3)

## 2012-06-04 LAB — PRO B NATRIURETIC PEPTIDE: Pro B Natriuretic peptide (BNP): 2501 pg/mL — ABNORMAL HIGH (ref 0–125)

## 2012-06-04 MED ORDER — ASPIRIN 81 MG PO CHEW
81.0000 mg | CHEWABLE_TABLET | Freq: Every day | ORAL | Status: DC
Start: 2012-06-05 — End: 2012-06-07
  Administered 2012-06-05 – 2012-06-07 (×3): 81 mg via ORAL
  Filled 2012-06-04 (×3): qty 1

## 2012-06-04 MED ORDER — IPRATROPIUM BROMIDE 0.02 % IN SOLN
0.5000 mg | RESPIRATORY_TRACT | Status: DC
  Administered 2012-06-05 – 2012-06-07 (×11): 0.5 mg via RESPIRATORY_TRACT
  Filled 2012-06-04 (×13): qty 2.5

## 2012-06-04 MED ORDER — PREDNISONE 20 MG PO TABS
40.0000 mg | ORAL_TABLET | Freq: Every day | ORAL | Status: DC
  Administered 2012-06-05: 40 mg via ORAL
  Filled 2012-06-04 (×2): qty 2

## 2012-06-04 MED ORDER — ALBUTEROL SULFATE (5 MG/ML) 0.5% IN NEBU
5.0000 mg | INHALATION_SOLUTION | RESPIRATORY_TRACT | Status: DC
  Administered 2012-06-05 – 2012-06-07 (×11): 5 mg via RESPIRATORY_TRACT
  Filled 2012-06-04 (×5): qty 0.5
  Filled 2012-06-04: qty 1
  Filled 2012-06-04 (×2): qty 0.5
  Filled 2012-06-04: qty 1
  Filled 2012-06-04 (×8): qty 0.5

## 2012-06-04 MED ORDER — SODIUM CHLORIDE 0.9 % IJ SOLN
3.0000 mL | Freq: Two times a day (BID) | INTRAMUSCULAR | Status: DC
  Administered 2012-06-05 – 2012-06-06 (×4): 3 mL via INTRAVENOUS

## 2012-06-04 MED ORDER — GUAIFENESIN ER 600 MG PO TB12
600.0000 mg | ORAL_TABLET | Freq: Two times a day (BID) | ORAL | Status: DC
  Administered 2012-06-05 – 2012-06-07 (×6): 600 mg via ORAL
  Filled 2012-06-04 (×7): qty 1

## 2012-06-04 MED ORDER — ALBUTEROL SULFATE (5 MG/ML) 0.5% IN NEBU
INHALATION_SOLUTION | RESPIRATORY_TRACT | Status: AC
  Administered 2012-06-04: 5 mg via RESPIRATORY_TRACT
  Filled 2012-06-04: qty 1

## 2012-06-04 MED ORDER — TRAZODONE HCL 100 MG PO TABS
100.0000 mg | ORAL_TABLET | Freq: Every evening | ORAL | Status: DC | PRN
  Filled 2012-06-04: qty 1

## 2012-06-04 MED ORDER — METHYLPREDNISOLONE SODIUM SUCC 125 MG IJ SOLR
125.0000 mg | Freq: Once | INTRAMUSCULAR | Status: AC
  Administered 2012-06-04: 125 mg via INTRAVENOUS
  Filled 2012-06-04: qty 2

## 2012-06-04 MED ORDER — ONDANSETRON HCL 4 MG PO TABS: 4.0000 mg | ORAL_TABLET | Freq: Four times a day (QID) | ORAL | Status: DC | PRN

## 2012-06-04 MED ORDER — HYDROCHLOROTHIAZIDE 12.5 MG PO CAPS
12.5000 mg | ORAL_CAPSULE | Freq: Every day | ORAL | Status: DC
  Administered 2012-06-05 – 2012-06-07 (×3): 12.5 mg via ORAL
  Filled 2012-06-04 (×3): qty 1

## 2012-06-04 MED ORDER — ENSURE PLUS PO LIQD
1.0000 | Freq: Two times a day (BID) | ORAL | Status: DC
  Administered 2012-06-05 (×2): 1 via ORAL
  Administered 2012-06-06: 237 mL via ORAL
  Administered 2012-06-06: 1 via ORAL
  Filled 2012-06-04 (×6): qty 237

## 2012-06-04 MED ORDER — ONDANSETRON HCL 4 MG/2ML IJ SOLN
INTRAMUSCULAR | Status: AC
  Filled 2012-06-04: qty 2

## 2012-06-04 MED ORDER — MIRTAZAPINE 15 MG PO TABS
15.0000 mg | ORAL_TABLET | Freq: Every day | ORAL | Status: DC
  Administered 2012-06-05 – 2012-06-06 (×3): 15 mg via ORAL
  Filled 2012-06-04 (×4): qty 1

## 2012-06-04 MED ORDER — IPRATROPIUM BROMIDE 0.02 % IN SOLN
0.5000 mg | Freq: Once | RESPIRATORY_TRACT | Status: AC
  Administered 2012-06-04: 0.5 mg via RESPIRATORY_TRACT
  Filled 2012-06-04: qty 2.5

## 2012-06-04 MED ORDER — ALBUTEROL SULFATE (5 MG/ML) 0.5% IN NEBU
5.0000 mg | INHALATION_SOLUTION | Freq: Once | RESPIRATORY_TRACT | Status: AC
  Administered 2012-06-04: 5 mg via RESPIRATORY_TRACT

## 2012-06-04 MED ORDER — FLUTICASONE-SALMETEROL 250-50 MCG/DOSE IN AEPB
1.0000 | INHALATION_SPRAY | Freq: Two times a day (BID) | RESPIRATORY_TRACT | Status: DC
  Filled 2012-06-04: qty 14

## 2012-06-04 MED ORDER — ENOXAPARIN SODIUM 40 MG/0.4ML ~~LOC~~ SOLN
40.0000 mg | SUBCUTANEOUS | Status: DC
  Administered 2012-06-05 – 2012-06-07 (×3): 40 mg via SUBCUTANEOUS
  Filled 2012-06-04 (×3): qty 0.4

## 2012-06-04 MED ORDER — ALBUTEROL SULFATE (5 MG/ML) 0.5% IN NEBU
5.0000 mg | INHALATION_SOLUTION | Freq: Once | RESPIRATORY_TRACT | Status: AC
  Administered 2012-06-04: 5 mg via RESPIRATORY_TRACT
  Filled 2012-06-04: qty 1

## 2012-06-04 MED ORDER — ALBUTEROL SULFATE (5 MG/ML) 0.5% IN NEBU
2.5000 mg | INHALATION_SOLUTION | Freq: Once | RESPIRATORY_TRACT | Status: AC
  Administered 2012-06-04: 2.5 mg via RESPIRATORY_TRACT
  Filled 2012-06-04: qty 0.5

## 2012-06-04 MED ORDER — ONDANSETRON HCL 4 MG/2ML IJ SOLN: 4.0000 mg | Freq: Four times a day (QID) | INTRAMUSCULAR | Status: DC | PRN

## 2012-06-04 MED ORDER — AMLODIPINE BESYLATE 10 MG PO TABS
10.0000 mg | ORAL_TABLET | Freq: Every day | ORAL | Status: DC
  Administered 2012-06-05 – 2012-06-06 (×2): 10 mg via ORAL
  Filled 2012-06-04 (×3): qty 1

## 2012-06-04 MED ORDER — METOPROLOL TARTRATE 12.5 MG HALF TABLET
12.5000 mg | ORAL_TABLET | Freq: Two times a day (BID) | ORAL | Status: DC
  Administered 2012-06-05 (×2): 12.5 mg via ORAL
  Filled 2012-06-04 (×3): qty 1

## 2012-06-04 MED ORDER — LORAZEPAM 2 MG/ML IJ SOLN: 1.0000 mg | INTRAMUSCULAR | Status: DC | PRN

## 2012-06-04 NOTE — ED Notes (Signed)
Pt is being non-compliant & vulgar.  She refused to complete her HHN tx and she refused to wear any form of oxygen.

## 2012-06-04 NOTE — H&P (Signed)
Hospital Admission Note Date: 06/05/2012  Patient name: Karina Weber Medical record number: [id number] Date of birth: [birth year] Age: 56 y.o. Gender: female PCP: PRIBULA,CHRISTOPHER, MD  Medical Service:  Attending physician:     Internal Medicine Teaching Service Contact Information  1st Contact: Dow Adolph, MD  Pager:[phone] 2nd Contact:  Dede Query, MD              Pager:[phone]  After 5 pm or weekends: 1st Contact: Pager: [phone] 2nd Contact: Pager: [phone]   Chief Complaint: shortness of breath  History of Present Illness: Patient is a poor historian and she is refusing to talk further with the admitting team at this time.  From what we can gather, Karina Weber is a 56 year old lady with a history of asthma, non-small cell lung cancer (right upper lobe, s/p radiation), alcohol abuse, COPD, hypertension, who presented to the [facility] ED with complaints of shortness of breath. She states that the air conditioner is not working in her apartment and this seems to be what triggered her shortness of breath. She refuses to tell us whether she has been taking her inhalers at home and raises her voice when asked about it.  Patient was recently admitted to the service in July with community-acquired pneumonia and acute on chronic respiratory failure. Prior to this admission, she was unable to fill her inhaler prescriptions and p/w fevers, chills, cough, and SOB. She was treated with 10 d of levaquin (but she only completed 8 of those) for CAP and flagyl for trichomonas.   ROS + for nausea, vomiting. She denies cough, fever, chills, chest pain. Refusing to answer other questions.  During interview, patient refused to answer any more questions and is agitated with nursing staff. She started to cuss and threaten to leave AMA.  Meds: No current outpatient prescriptions on file.  Allergies: Allergies as of 06/04/2012 - Review Complete 06/04/2012  Allergen Reaction Noted  . Lisinopril  Cough 07/28/2011   Past Medical History  Diagnosis Date  . Tobacco abuse   . COPD (chronic obstructive pulmonary disease)     exacerbagtion in 6/12 and in 2010.   Marland Kitchen Hypertension   . History of radiation therapy 07/19/10 to 07/28/10    RUL lung  . 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 Alcohol abuse     2 beers a day  . Asthma    Past Surgical History  Procedure Date  . No past surgeries    Family History  Problem Relation Age of Onset  . Heart disease Neg Hx   . Cancer Mother     unknown type   History   Social History  . Marital Status: Single    Spouse Name: N/A    Number of Children: N/A  . Years of Education: N/A   Occupational History  . Not on file.   Social History Main Topics  . Smoking status: Current Some Day Smoker -- 0.2 packs/day for 40 years    Types: Cigarettes  . Smokeless tobacco: Never Used   Comment: Cut back from 2 ppd when diagnosed with Lung cancer.12/02/11 1 cig/day  . Alcohol Use: No     Former alcoholic  . Drug Use: No  . Sexually Active: Not Currently   Other Topics Concern  . Not on file   Social History Narrative   Lives at home with a roommate (the roommate's son had a kid with the patient's daughter but they are not married). She  takes care of herself, does not have any assistance when she is sick. He is able to perform ADLs when she is not sick . Used to work in [employer], retired 2 years ago . Said it up to 10th grade . SHe on Medicaid now. Smokes 1 pack per day. Last smoke yesterday. Drinks 2 cans of beer every day. Never did drugs .    Review of Systems: Review of systems not obtained due to patient factors.  Physical Exam Blood pressure 180/74, pulse 92, temperature 98.4 F (36.9 C), temperature source Axillary, resp. rate 21, height 5\' 4"  (1.626 m), weight 168 lb 3.4 oz (76.3 kg), SpO2 95.00%. General:  Agitated with staff, alert and oriented  HEENT: limited by patient's refusal Cardiovascular:  Regular rate and  rhythm, no murmurs, rubs or gallops Respiratory:  Minimal air movement at bases, RR is 20 Abdomen:  Soft, nondistended, nontender, normoactive bowel sounds Extremities:  Pt refused exam Skin: Warm, dry, no rashes   Lab results: Basic Metabolic Panel:  Basename 06/04/12 1722  NA 138  K 4.5  CL 99  CO2 29  GLUCOSE 131*  BUN 22  CREATININE 0.79  CALCIUM 9.2  MG --  PHOS --   Liver Function Tests:  Basename 06/04/12 1722  AST 16  ALT 11  ALKPHOS 161*  BILITOT 0.3  PROT 7.5  ALBUMIN 3.6   No results found for this basename: LIPASE:2,AMYLASE:2 in the last 72 hours No results found for this basename: AMMONIA:2 in the last 72 hours CBC:  Basename 06/04/12 1722  WBC 9.6  NEUTROABS 7.1  HGB 11.0*  HCT 37.0  MCV 85.3  PLT 349   BNP:  Basename 06/04/12 1722  PROBNP 2501.0*   Urine Drug Screen: Drugs of Abuse     Component Value Date/Time   LABOPIA NEGATIVE 03/10/2011 0520   LABOPIA NONE DETECTED 11/27/2009 0514   COCAINSCRNUR NEGATIVE 03/10/2011 0520   COCAINSCRNUR NONE DETECTED 11/27/2009 0514   LABBENZ NEGATIVE 03/10/2011 0520   LABBENZ NONE DETECTED 11/27/2009 0514   AMPHETMU NEGATIVE 03/10/2011 0520   AMPHETMU NONE DETECTED 11/27/2009 0514   THCU NONE DETECTED 11/27/2009 0514   LABBARB  Value: NONE DETECTED        DRUG SCREEN FOR MEDICAL PURPOSES ONLY.  IF CONFIRMATION IS NEEDED FOR ANY PURPOSE, NOTIFY LAB WITHIN 5 DAYS.        LOWEST DETECTABLE LIMITS FOR URINE DRUG SCREEN Drug Class       Cutoff (ng/mL) Amphetamine      1000 Barbiturate      200 Benzodiazepine   200 Tricyclics       300 Opiates          300 Cocaine          300 THC              50 11/27/2009 0514     Imaging results:  Dg Chest Port 1 View  06/04/2012  *RADIOLOGY REPORT*  Clinical Data: Shortness of breath.  PORTABLE CHEST - 1 VIEW  Comparison: Radiographs 04/14/2012 and CT 04/17/2012.  Findings: 1705 hours.  Cardiomegaly and generalized interstitial prominence are stable.  There is increased streaky left  lower lobe opacity, most consistent with atelectasis.  There is no focal airspace disease or significant pleural effusion.  Telemetry leads overlie the chest.  IMPRESSION: Stable cardiomegaly and chronic vascular congestion.  Mildly increased streaky left lower lobe opacity, likely atelectasis.   Original Report Authenticated By: Gerrianne Scale, M.D.  Assessment & Plan by Problem: Principal Problem:  *Acute-on-chronic respiratory failure Active Problems:  ALCOHOLISM  TOBACCO USE  ESSENTIAL HYPERTENSION  COPD with acute exacerbation  Protein calorie malnutrition   1) Acute on chronic respiratory failure: Chronic hypoxic with acute Hypercarbic respiratory failure.   No new infiltrates on chest x-ray.    Patient reported wheezing for ER physician. Not cooperative with exam.   ABG shows pH 7.17, PCO2 83 and PO2 74- worse compared to last admission.   Possible noncompliance with inhalers at home.   - Admit to step down floor.   - Start BiPAP    - ABG in morning    - Albuterol and ipratropium inhalers every 4 hours scheduled.   - Patient got IV Solu-Medrol in the ER. We'll start prednisone 40 mg daily from tomorrow morning.   - Doxycycline 100 mg by mouth twice a day for COPD exacerbation.   - Patient was supposed to get PFTs as outpatient after last discharge. Was scheduled to see a nurse practitioner on 05/03/2012- but apparently patient did not keep appointment. -consider ECHO (last one was in 2011 with 55-60% with grade 1 DD), recent BNP today was 2500         2) Hypertension: She is well controlled her home medications.    - Continue home meds for now.       3) Tobacco abuse: She continues to smoke several cigarettes daily as per notes.    Will give her a nicotine patch while she is here to help her and encourage her to quit completely.      4) History of right lung adenocarcinoma: Status post radiation.            5) VTE: Lovenox      Signed: Denton Ar 06/05/2012, 12:41 AM

## 2012-06-04 NOTE — ED Notes (Signed)
History of asthma and COPD. Out of albuterol inhaler x 1 week, sob started today, 87% on 6 L oxygen [place]. Pt placed on NRB with improving sat of 95%. Diminished breath sound noted, pt in no acute distress. Also reports feeling generalized weakness. EMS said her house was really hot. Respiratory therapist called for 5mg  albuterol treatment.

## 2012-06-04 NOTE — ED Provider Notes (Signed)
History     CSN: [id number]  Arrival date & time 06/04/12  1621   First MD Initiated Contact with Patient 06/04/12 1649      Chief Complaint  Patient presents with  . Shortness of Breath    (Consider location/radiation/quality/duration/timing/severity/associated sxs/prior treatment) Patient is a 56 y.o. female presenting with shortness of breath. The history is provided by the patient (pt complains of sob). No language interpreter was used.  Shortness of Breath  The current episode started today. The problem occurs frequently. The problem has been unchanged. The problem is moderate. Nothing relieves the symptoms. Nothing aggravates the symptoms. Associated symptoms include shortness of breath. Pertinent negatives include no chest pain and no cough. She has not inhaled smoke recently. She has had no prior steroid use. She has had prior hospitalizations. She has had prior ICU admissions. Her past medical history is significant for asthma.    Past Medical History  Diagnosis Date  . Tobacco abuse   . COPD (chronic obstructive pulmonary disease)     exacerbagtion in 6/12 and in 2010.   Marland Kitchen Hypertension   . History of radiation therapy 07/19/10 to 07/28/10    RUL lung  . 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 Alcohol abuse     2 beers a day  . Asthma     Past Surgical History  Procedure Date  . No past surgeries     Family History  Problem Relation Age of Onset  . Heart disease Neg Hx   . Cancer Mother     unknown type    History  Substance Use Topics  . Smoking status: Current Some Day Smoker -- 0.2 packs/day for 40 years    Types: Cigarettes  . Smokeless tobacco: Never Used   Comment: Cut back from 2 ppd when diagnosed with Lung cancer.12/02/11 1 cig/day  . Alcohol Use: No     Former alcoholic    OB History    Grav Para Term Preterm Abortions TAB SAB Ect Mult Living                  Review of Systems  Constitutional: Negative for fatigue.  HENT: Negative  for congestion, sinus pressure and ear discharge.   Eyes: Negative for discharge.  Respiratory: Positive for shortness of breath. Negative for cough.   Cardiovascular: Negative for chest pain.  Gastrointestinal: Negative for abdominal pain and diarrhea.  Genitourinary: Negative for frequency and hematuria.  Musculoskeletal: Negative for back pain.  Skin: Negative for rash.  Neurological: Negative for seizures and headaches.  Hematological: Negative.   Psychiatric/Behavioral: Negative for hallucinations.    Allergies  Lisinopril  Home Medications   Current Outpatient Rx  Name Route Sig Dispense Refill  . HYDROCHLOROTHIAZIDE 12.5 MG PO CAPS Oral Take 12.5 mg by mouth daily.    Marland Kitchen METOPROLOL TARTRATE 25 MG PO TABS Oral Take 12.5 mg by mouth 2 (two) times daily. Take 1/2 tablet (12.5 mg total) twice daily    . ALBUTEROL SULFATE (2.5 MG/3ML) 0.083% IN NEBU Nebulization Take 3 mLs (2.5 mg total) by nebulization every 6 (six) hours as needed for wheezing. 75 mL 12  . ALBUTEROL SULFATE HFA 108 (90 BASE) MCG/ACT IN AERS Inhalation Inhale 1 puff into the lungs every 4 (four) hours as needed for wheezing. 18 g 6  . AMLODIPINE BESYLATE 10 MG PO TABS Oral Take 1 tablet (10 mg total) by mouth daily. 30 tablet 5  . ASPIRIN 81 MG  PO TABS Oral Take 1 tablet (81 mg total) by mouth daily. 31 tablet 1  . FLUTICASONE-SALMETEROL 250-50 MCG/DOSE IN AEPB Inhalation Inhale 1 puff into the lungs every 12 (twelve) hours. 60 each 11  . GUAIFENESIN ER 600 MG PO TB12 Oral Take 1 tablet (600 mg total) by mouth 2 (two) times daily. 30 tablet 0  . IPRATROPIUM BROMIDE 0.02 % IN SOLN Nebulization Take 2.5 mLs (500 mcg total) by nebulization 4 (four) times daily. 75 mL 11  . MIRTAZAPINE 15 MG PO TABS Oral Take 1 tablet (15 mg total) by mouth at bedtime. 30 tablet 6  . ENSURE HIGH PROTEIN PO LIQD Oral Take 1 Can by mouth 2 (two) times daily. 60 Can 6  . PREDNISONE 20 MG PO TABS  Starting 7/18 Take 2 tablets daily for 3  days then 1 tablet daily for 3 days then 1/2 tablet daily for 4 days then stop. 11 tablet 0  . TRAZODONE HCL 100 MG PO TABS Oral Take 1 tablet (100 mg total) by mouth at bedtime as needed for sleep. 30 tablet 6    BP 138/77  Pulse 95  Temp 98.3 F (36.8 C) (Oral)  Resp 23  SpO2 89%  Physical Exam  Constitutional: She is oriented to person, place, and time. She appears well-developed.  HENT:  Head: Normocephalic and atraumatic.  Eyes: Conjunctivae and EOM are normal. No scleral icterus.  Neck: Neck supple. No thyromegaly present.  Cardiovascular: Normal rate and regular rhythm.  Exam reveals no gallop and no friction rub.   No murmur heard. Pulmonary/Chest: No stridor. She has wheezes. She has no rales. She exhibits no tenderness.  Abdominal: She exhibits no distension. There is no tenderness. There is no rebound.  Musculoskeletal: Normal range of motion. She exhibits no edema.  Lymphadenopathy:    She has no cervical adenopathy.  Neurological: She is oriented to person, place, and time. Coordination normal.  Skin: No rash noted. No erythema.  Psychiatric: She has a normal mood and affect. Her behavior is normal.    ED Course  Procedures (including critical care time)  Labs Reviewed  CBC WITH DIFFERENTIAL - Abnormal; Notable for the following:    Hemoglobin 11.0 (*)     MCH 25.3 (*)     MCHC 29.7 (*)     RDW 19.2 (*)     All other components within normal limits  COMPREHENSIVE METABOLIC PANEL - Abnormal; Notable for the following:    Glucose, Bld 131 (*)     Alkaline Phosphatase 161 (*)     All other components within normal limits  BLOOD GAS, ARTERIAL - Abnormal; Notable for the following:    pH, Arterial 7.176 (*)     pCO2 arterial 83.1 (*)     pO2, Arterial 74.0 (*)     Bicarbonate 29.5 (*)     All other components within normal limits  PRO B NATRIURETIC PEPTIDE   Dg Chest Port 1 View  06/04/2012  *RADIOLOGY REPORT*  Clinical Data: Shortness of breath.  PORTABLE  CHEST - 1 VIEW  Comparison: Radiographs 04/14/2012 and CT 04/17/2012.  Findings: 1705 hours.  Cardiomegaly and generalized interstitial prominence are stable.  There is increased streaky left lower lobe opacity, most consistent with atelectasis.  There is no focal airspace disease or significant pleural effusion.  Telemetry leads overlie the chest.  IMPRESSION: Stable cardiomegaly and chronic vascular congestion.  Mildly increased streaky left lower lobe opacity, likely atelectasis.   Original Report Authenticated By: Chrissie Noa  B. Purcell Mouton, M.D.      1. COPD (chronic obstructive pulmonary disease)      CRITICAL CARE Performed by: Cristine Daw L   Total critical care time: 45  Critical care time was exclusive of separately billable procedures and treating other patients.  Critical care was necessary to treat or prevent imminent or life-threatening deterioration.  Critical care was time spent personally by me on the following activities: development of treatment plan with patient and/or surrogate as well as nursing, discussions with consultants, evaluation of patient's response to treatment, examination of patient, obtaining history from patient or surrogate, ordering and performing treatments and interventions, ordering and review of laboratory studies, ordering and review of radiographic studies, pulse oximetry and re-evaluation of patient's condition.  MDM          Benny Lennert, MD 06/04/12 [phone]

## 2012-06-04 NOTE — Progress Notes (Signed)
Pt started on BIPAP 10/5 per MD. Pt was non compliant at first and doctor ordered restaints. Pt seems to be calm at this time. Will continue to monitor.

## 2012-06-04 NOTE — Progress Notes (Signed)
Pt refuses to wear Bipap at this time. Pt continued to state that pt does not want mask on and she wants to eat. Pt refuses to take breathing txs as well. Pt has been well informed that we are trying to help with her breathing but she continues to refuse. RN aware and speaking with MD at this time. No distress at this time. Pt on 4L N/C SpO2 92%.

## 2012-06-05 ENCOUNTER — Encounter (HOSPITAL_COMMUNITY): Payer: Self-pay | Admitting: *Deleted

## 2012-06-05 LAB — BASIC METABOLIC PANEL
BUN: 28 mg/dL — ABNORMAL HIGH (ref 6–23)
CO2: 31 mEq/L (ref 19–32)
Calcium: 9.7 mg/dL (ref 8.4–10.5)
Chloride: 94 mEq/L — ABNORMAL LOW (ref 96–112)
Creatinine, Ser: 0.7 mg/dL (ref 0.50–1.10)

## 2012-06-05 LAB — BLOOD GAS, ARTERIAL
Acid-base deficit: 0.3 mmol/L (ref 0.0–2.0)
Drawn by: 31057
O2 Content: 4 L/min
pCO2 arterial: 83.1 mmHg (ref 35.0–45.0)

## 2012-06-05 MED ORDER — ARFORMOTEROL TARTRATE 15 MCG/2ML IN NEBU
15.0000 ug | INHALATION_SOLUTION | Freq: Two times a day (BID) | RESPIRATORY_TRACT | Status: DC
  Administered 2012-06-06 – 2012-06-07 (×2): 15 ug via RESPIRATORY_TRACT
  Filled 2012-06-05 (×9): qty 2

## 2012-06-05 MED ORDER — METHYLPREDNISOLONE SODIUM SUCC 125 MG IJ SOLR
80.0000 mg | Freq: Two times a day (BID) | INTRAMUSCULAR | Status: DC
  Administered 2012-06-05 – 2012-06-06 (×2): 80 mg via INTRAVENOUS
  Filled 2012-06-05 (×4): qty 1.28

## 2012-06-05 MED ORDER — DOXYCYCLINE HYCLATE 100 MG PO TABS
100.0000 mg | ORAL_TABLET | Freq: Two times a day (BID) | ORAL | Status: DC
  Administered 2012-06-05 – 2012-06-07 (×5): 100 mg via ORAL
  Filled 2012-06-05 (×6): qty 1

## 2012-06-05 MED ORDER — BISOPROLOL FUMARATE 5 MG PO TABS
5.0000 mg | ORAL_TABLET | Freq: Every day | ORAL | Status: DC
  Administered 2012-06-05 – 2012-06-07 (×3): 5 mg via ORAL
  Filled 2012-06-05 (×3): qty 1

## 2012-06-05 NOTE — Consult Note (Addendum)
Name: Karina Weber MRN: [id number] DOB: 05/16/1956  LOS: 1  [place] PULMONARY CONSULT NOTE  History of Present Illness: This is a 56 y/o female with COPD, non-small cell lung cancer and alcohol abuse who was admitted through the [facility] ED on 06/04/2012 with one day of shortness of breath.  She states that she had been feeling worse in the last few days after her air conditioning when out. She states that she had been coughing slightly more than normal but her primary symptom was increasing shortness of breath. She states that she had run out of her Advair several days prior to admission. She continues to take her Advair when she has it although she is uncertain how to take it appropriately. She denies chest pain or leg swelling. She states that at baseline she cannot climb a flight of stairs nor can she walk all the way to the grocery store without stopping.  Lines / Drains: Peripheral IV  Cultures / Sepsis markers:   Antibiotics: 06/04/2012 Doxycycline (AE COPD) >>  Tests / Events: 06/04/12 Portable CXR>> slightly rotated, cardiomegaly, R hilar lymphadenopathy 06/04/12 TTE >>     Past Medical History  Diagnosis Date  . Tobacco abuse   . COPD (chronic obstructive pulmonary disease)     exacerbagtion in 6/12 and in 2010.   Marland Kitchen Hypertension   . History of radiation therapy 07/19/10 to 07/28/10    RUL lung  . 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 Alcohol abuse     2 beers a day  . Asthma    Past Surgical History  Procedure Date  . No past surgeries    Prior to Admission medications   Medication Sig Start Date End Date Taking? Authorizing Provider  albuterol (PROVENTIL) (2.5 MG/3ML) 0.083% nebulizer solution Take 3 mLs (2.5 mg total) by nebulization every 6 (six) hours as needed for wheezing. 05/03/12 05/03/13 Yes Leodis Sias, MD  albuterol (VENTOLIN HFA) 108 (90 BASE) MCG/ACT inhaler Inhale 1 puff into the lungs every 4 (four) hours as needed for wheezing. 05/03/12  Yes  Leodis Sias, MD  amLODipine (NORVASC) 10 MG tablet Take 1 tablet (10 mg total) by mouth daily. 05/03/12  Yes Leodis Sias, MD  Fluticasone-Salmeterol (ADVAIR) 250-50 MCG/DOSE AEPB Inhale 1 puff into the lungs every 12 (twelve) hours. 05/03/12  Yes Leodis Sias, MD  hydrochlorothiazide (MICROZIDE) 12.5 MG capsule Take 12.5 mg by mouth daily.   Yes Historical Provider, MD  ipratropium (ATROVENT) 0.02 % nebulizer solution Take 2.5 mLs (500 mcg total) by nebulization 4 (four) times daily. 05/03/12  Yes Leodis Sias, MD  metoprolol tartrate (LOPRESSOR) 25 MG tablet Take 12.5 mg by mouth 2 (two) times daily. Take 1/2 tablet (12.5 mg total) twice daily   Yes Historical Provider, MD  mirtazapine (REMERON) 15 MG tablet Take 1 tablet (15 mg total) by mouth at bedtime. 05/03/12  Yes Leodis Sias, MD  traZODone (DESYREL) 100 MG tablet Take 1 tablet (100 mg total) by mouth at bedtime as needed for sleep. 05/03/12 06/02/12  Leodis Sias, MD   Allergies  Allergen Reactions  . Lisinopril Cough   Family History  Problem Relation Age of Onset  . Heart disease Neg Hx   . Cancer Mother     unknown type   Social History  reports that she has been smoking Cigarettes.  She has a 8 pack-year smoking history. She has never used smokeless tobacco. She reports that she does not drink alcohol or use  illicit drugs.  Review Of Systems   Gen: Denies fever, chills, weight change, fatigue, night sweats HEENT: Denies blurred vision, double vision, hearing loss, tinnitus, sinus congestion, rhinorrhea, sore throat, neck stiffness, dysphagia PULM: per HPI CV: Denies chest pain, edema, orthopnea, paroxysmal nocturnal dyspnea, palpitations GI: Denies abdominal pain, nausea, vomiting, diarrhea, hematochezia, melena, constipation, change in bowel habits GU: Denies dysuria, hematuria, polyuria, oliguria, urethral discharge Endocrine: Denies hot or cold intolerance, polyuria, polyphagia or appetite  change Derm: Denies rash, dry skin, scaling or peeling skin change Heme: Denies easy bruising, bleeding, bleeding gums Neuro: Denies headache, numbness, weakness, slurred speech, loss of memory or consciousness  Vital Signs:   Filed Vitals:   06/05/12 0400 06/05/12 0704 06/05/12 0705 06/05/12 1140  BP: 129/74 143/74    Pulse:  96 97   Temp: 98.3 F (36.8 C) 98.5 F (36.9 C)  98.2 F (36.8 C)  TempSrc: Oral Oral  Oral  Resp:  19 17   Height:      Weight:      SpO2:  96% 93%     Physical Examination: Gen: chronically ill appearing, tachypnic but comfortable HEENT: NCAT, PERRL, EOMi, OP clear,  Neck: supple without masses PULM: Wheezing bilaterally CV: RRR, no mgr, no JVD AB: BS+, soft, nontender, no hsm Ext: warm, no edema, no clubbing, no cyanosis Derm: no rash or skin breakdown Neuro: A&Ox4, CN II-XII intact, strength 5/5 in all 4 extremities Psyche: Normal mood and affect  Labs and Imaging:   CBC    Component Value Date/Time   WBC 9.6 06/04/2012 1722   RBC 4.34 06/04/2012 1722   HGB 11.0* 06/04/2012 1722   HCT 37.0 06/04/2012 1722   PLT 349 06/04/2012 1722   MCV 85.3 06/04/2012 1722   MCH 25.3* 06/04/2012 1722   MCHC 29.7* 06/04/2012 1722   RDW 19.2* 06/04/2012 1722   LYMPHSABS 1.8 06/04/2012 1722   MONOABS 0.7 06/04/2012 1722   EOSABS 0.1 06/04/2012 1722   BASOSABS 0.0 06/04/2012 1722    BMET    Component Value Date/Time   NA 138 06/04/2012 1722   K 4.5 06/04/2012 1722   CL 99 06/04/2012 1722   CO2 29 06/04/2012 1722   GLUCOSE 131* 06/04/2012 1722   BUN 22 06/04/2012 1722   CREATININE 0.79 06/04/2012 1722   CALCIUM 9.2 06/04/2012 1722   GFRNONAA >90 06/04/2012 1722   GFRAA >90 06/04/2012 1722    ABG    Component Value Date/Time   PHART 7.256* 06/05/2012 0937   PCO2ART 74.8* 06/05/2012 0937   PO2ART 75.5* 06/05/2012 0937   HCO3 32.1* 06/05/2012 0937   TCO2 34.4 06/05/2012 0937   ACIDBASEDEF 0.3 06/04/2012 1827   O2SAT 94.4 06/05/2012 0937   EKG 9/2: NSR, Biatrial enlargement, no ST wave  changes  Assessment and Plan:  56 y/o female with COPD (2L O2 at home, unknown lung function) and lung cancer (unclear staging, treatment) who is here with hypercarbic respiratory failure due to a COPD exacerbation.  Her ABG improved overnight but has not completely normalized.  Subjectively she feels much better which is encouraging.  Objectively she is still wheezing and tachypnic, refusing more BIPAP and invasive ventilation.  She appears to have advanced COPD so recovering from an exacerbation will be difficult.  She is improving with current therapy, but I think the changes outlined below will be helpful.  This morning she does not appear to have impending respiratory failure.  I explained to her that she would have a difficult  time coming off a ventilator.  She told me that she did not want invasive ventilation and that if she stopped breathing she wanted [place] to "just let her die".   COPD with acute exacerbation (09/04/2007)   Assessment: severe COPD, frequent hospitalizations   Plan:  -would consult social work to help her replace her home A/C unit as symptoms started with the hot weather -d/c Advair for now while in SDU, OK to restart when transfers out to floor -start brovana (alformoterol) nebulizer bid -start pulmicort nebulizer bid -change prednisone to methylprednisone 80mg  IV q12 -continue other bronchodilators as you are doing -change metoprolol to bisoprolol -will need instructions on Advair use prior to discharge -when out on floor, will need home O2 eval to make sure her oxygen is dosed appropriately -will need outpatient f/u with pulmonary -will likely need tiotropium and roflumilast as outpatient if she can afford them  Lung Cancer   Assessment:    Plan: -would obtain records from cancer center  TOBACCO USE (09/04/2007)   Assessment: contributing to her AE COPD   Plan: -encouraged to quit  Code status: See discussion above.  I discussed with primary team. They will  address with the patient and change code status as appropriate.  Will follow  Heber Taylor, M.D. Pulmonary and Critical Care Medicine [facility] Pager: [phone]  06/05/2012, 12:01 PM

## 2012-06-05 NOTE — H&P (Signed)
Subjective: Karina Weber is a 56 year old lady admitted for COPD exacerbation. Please refer to the resident note for detailed H and P.  Patient Active Problem List   Diagnosis Date Noted  . Hematest positive stools 04/26/2012  . Medication management 04/26/2012  . CAP (community acquired pneumonia) 04/13/2012  . Protein calorie malnutrition 04/13/2012  . Tachycardia 04/13/2012  . Acute-on-chronic respiratory failure 12/27/2011  . History of radiation therapy   . Humerus fracture, Left 05/20/2011  . ADENOCARCINOMA, RIGHT LUNG 05/21/2010  . HYPERGLYCEMIA, BORDERLINE 02/12/2010  . ESSENTIAL HYPERTENSION 09/22/2009  . VISUAL IMPAIRMENT 11/19/2007  . ALCOHOLISM 09/04/2007  . TOBACCO USE 09/04/2007  . COPD with acute exacerbation 09/04/2007   Past Medical History  Diagnosis Date  . Tobacco abuse   . COPD (chronic obstructive pulmonary disease)     exacerbagtion in 6/12 and in 2010.   Marland Kitchen Hypertension   . History of radiation therapy 07/19/10 to 07/28/10    RUL lung  . 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 Alcohol abuse     2 beers a day  . Asthma     Past Surgical History  Procedure Date  . No past surgeries     Prescriptions prior to admission  Medication Sig Dispense Refill  . albuterol (PROVENTIL) (2.5 MG/3ML) 0.083% nebulizer solution Take 3 mLs (2.5 mg total) by nebulization every 6 (six) hours as needed for wheezing.  75 mL  12  . albuterol (VENTOLIN HFA) 108 (90 BASE) MCG/ACT inhaler Inhale 1 puff into the lungs every 4 (four) hours as needed for wheezing.  18 g  6  . amLODipine (NORVASC) 10 MG tablet Take 1 tablet (10 mg total) by mouth daily.  30 tablet  5  . Fluticasone-Salmeterol (ADVAIR) 250-50 MCG/DOSE AEPB Inhale 1 puff into the lungs every 12 (twelve) hours.  60 each  11  . hydrochlorothiazide (MICROZIDE) 12.5 MG capsule Take 12.5 mg by mouth daily.      Marland Kitchen ipratropium (ATROVENT) 0.02 % nebulizer solution Take 2.5 mLs (500 mcg total) by nebulization 4 (four)  times daily.  75 mL  11  . metoprolol tartrate (LOPRESSOR) 25 MG tablet Take 12.5 mg by mouth 2 (two) times daily. Take 1/2 tablet (12.5 mg total) twice daily      . mirtazapine (REMERON) 15 MG tablet Take 1 tablet (15 mg total) by mouth at bedtime.  30 tablet  6  . traZODone (DESYREL) 100 MG tablet Take 1 tablet (100 mg total) by mouth at bedtime as needed for sleep.  30 tablet  6   Allergies  Allergen Reactions  . Lisinopril Cough    History  Substance Use Topics  . Smoking status: Current Some Day Smoker -- 0.2 packs/day for 40 years    Types: Cigarettes  . Smokeless tobacco: Never Used   Comment: Cut back from 2 ppd when diagnosed with Lung cancer.12/02/11 1 cig/day  . Alcohol Use: No     Former alcoholic    Family History  Problem Relation Age of Onset  . Heart disease Neg Hx   . Cancer Mother     unknown type    Review of Systems Noted as per the resident H and P.  Objective: Vital signs: Temp:  [98.2 F (36.8 C)-98.6 F (37 C)] 98.2 F (36.8 C) (09/03 1140) Pulse Rate:  [92-99] 97  (09/03 1132) Resp:  [15-25] 24  (09/03 1132) BP: (108-180)/(65-83) 108/65 mmHg (09/03 1132) SpO2:  [89 %-100 %] 96 % (  09/03 1249) Weight:  [168 lb 3.4 oz (76.3 kg)] 168 lb 3.4 oz (76.3 kg) (09/02 2331) HEENT : normal, bad dentition. CVS: Regular heart sounds. JVP increased.no M/R/G RS: decreased air entry and occasional wheezes. PA: soft, non tender. Neuro: non focal.  Chest X-Ray:  No obvious infiltrate. Data Review: Noted.  Assessment/Plan: COPD exacerbation: can convert IV methylpred to po prednisone tom am. Continue full course of antibiotics for probable left lower lobe atelectasis. continue nebs and monitor overnight for BiPAP requirements. Repeat ABG is better and more nearer to her baseline CO2 status. Patient states that she does not use oxygen at home all the time.  Lung CA: patient denies having lung cancer and says she has had no radiation therapy. She does say that  doctors dont tell her anything.  HTN: continue Bisoprolol and HCTZ.  Cardiac: Increased JVD ? Right heart failure from severe COPD. BNP done was elevated.  Rest agress with H and P by the resident.

## 2012-06-05 NOTE — Progress Notes (Signed)
Echocardiogram 2D Echocardiogram has been performed.  Karina Weber 06/05/2012, 12:22 PM

## 2012-06-05 NOTE — Progress Notes (Signed)
Utilization review completed.  

## 2012-06-05 NOTE — Progress Notes (Signed)
Patient ID: Karina Weber , female   DOB: [birth year], 56 y.o.   MRN: [id number]     Subjective:    Interval Events:   [name] reports to be feeling better this morning. Her main complaint is weakness in her legs when she walks a short distance. Her oxygen drops to 84-86% when she is off oxygen. She is currently saturation in 93-98% on 4L of oxygen by nasal cannula. She had breakfast this morning. She reports gets short of breath with increased temperature in her room. She is frustrated because can not get her medication even though she has a medicare card. She gets conflicting information from many sources and she does not seem to get the help she needs which is her medication refills.    Objective:    Vital Signs:   Temp:  [98.3 F (36.8 C)-98.6 F (37 C)] 98.5 F (36.9 C) (09/03 0704) Pulse Rate:  [92-99] 97  (09/03 0705) Resp:  [15-25] 17  (09/03 0705) BP: (129-180)/(74-83) 143/74 mmHg (09/03 0704) SpO2:  [89 %-100 %] 93 % (09/03 0705) Weight:  [168 lb 3.4 oz (76.3 kg)] 168 lb 3.4 oz (76.3 kg) (09/02 2331)     Weights: 24-hour Weight change:   Filed Weights   06/04/12 2331  Weight: 168 lb 3.4 oz (76.3 kg)     Intake/Output:   Intake/Output Summary (Last 24 hours) at 06/05/12 1023 Last data filed at 06/05/12 0800  Gross per 24 hour  Intake    240 ml  Output   1200 ml  Net   -960 ml       Physical Exam: General appearance: alert, cooperative and mild distress Head: Normocephalic, without obvious abnormality, atraumatic Eyes: negative, conjunctivae/corneas clear. PERRL, EOM's intact. Fundi benign. Neck: JVD - 4 cm above sternal notch and no JVD Resp: wheezes bibasilar and bilaterally Chest wall: no tenderness Cardio: regular rate and rhythm, S1, S2 normal, no murmur, click, rub or gallop GI: soft, non-tender; bowel sounds normal; no masses,  no organomegaly Extremities: extremities normal, atraumatic, no cyanosis or edema Pulses: 2+ and symmetric Neurologic:  Alert and oriented X 3, normal strength and tone. Normal symmetric reflexes. Normal coordination and gait    Labs: Basic Metabolic Panel:  Lab 06/04/12 4098  NA 138  K 4.5  CL 99  CO2 29  GLUCOSE 131*  BUN 22  CREATININE 0.79  CALCIUM 9.2  MG --  PHOS --    Liver Function Tests:  Lab 06/04/12 1722  AST 16  ALT 11  ALKPHOS 161*  BILITOT 0.3  PROT 7.5  ALBUMIN 3.6   No results found for this basename: LIPASE:5,AMYLASE:5 in the last 168 hours No results found for this basename: AMMONIA:3 in the last 168 hours  CBC:  Lab 06/04/12 1722  WBC 9.6  NEUTROABS 7.1  HGB 11.0*  HCT 37.0  MCV 85.3  PLT 349    Cardiac Enzymes: No results found for this basename: CKTOTAL:5,CKMB:5,CKMBINDEX:5,TROPONINI:5 in the last 168 hours  BNP: No components found with this basename: POCBNP:5  CBG: No results found for this basename: GLUCAP:5 in the last 168 hours  Coagulation Studies: No results found for this basename: LABPROT:5,INR:5 in the last 72 hours  Microbiology: Results for orders placed during the hospital encounter of 06/04/12  MRSA PCR SCREENING     Status: Normal   Collection Time   06/04/12 11:20 PM      Component Value Range Status Comment   MRSA by PCR NEGATIVE  NEGATIVE Final  Imaging: Dg Chest Port 1 View  06/04/2012  *RADIOLOGY REPORT*  Clinical Data: Shortness of breath.  PORTABLE CHEST - 1 VIEW  Comparison: Radiographs 04/14/2012 and CT 04/17/2012.  Findings: 1705 hours.  Cardiomegaly and generalized interstitial prominence are stable.  There is increased streaky left lower lobe opacity, most consistent with atelectasis.  There is no focal airspace disease or significant pleural effusion.  Telemetry leads overlie the chest.  IMPRESSION: Stable cardiomegaly and chronic vascular congestion.  Mildly increased streaky left lower lobe opacity, likely atelectasis.   Original Report Authenticated By: Gerrianne Scale, M.D.       Medications:     Infusions:     Scheduled Medications:    . albuterol  2.5 mg Nebulization Once  . albuterol  5 mg Nebulization Once  . albuterol  5 mg Nebulization Once  . albuterol  5 mg Nebulization Q4H  . amLODipine  10 mg Oral Daily  . aspirin  81 mg Oral Daily  . doxycycline  100 mg Oral Q12H  . enoxaparin (LOVENOX) injection  40 mg Subcutaneous Q24H  . Ensure Plus  1 Can Oral BID  . Fluticasone-Salmeterol  1 puff Inhalation Q12H  . guaiFENesin  600 mg Oral BID  . hydrochlorothiazide  12.5 mg Oral Daily  . ipratropium  0.5 mg Nebulization Once  . ipratropium  0.5 mg Nebulization Once  . ipratropium  0.5 mg Nebulization Q4H  . methylPREDNISolone (SOLU-MEDROL) injection  125 mg Intravenous Once  . metoprolol tartrate  12.5 mg Oral BID  . mirtazapine  15 mg Oral QHS  . ondansetron      . predniSONE  40 mg Oral Q breakfast  . sodium chloride  3 mL Intravenous Q12H     PRN Medications: ondansetron (ZOFRAN) IV, ondansetron, traZODone, DISCONTD: LORazepam   Assessment/ Plan:    This is a 56 year old woman with past medical history significant for COPD with multiple admission for COPD exercabations, lung cancer, alcohol abuse and asthma, who was admitted yesterday with acute on chronic respiratory failure with a of 7.17. She reports that she ran out of her medications including inhalers for a couple days and developed severe shortness of breath after her AC machine at home went down. She was initially evaluated in [facility] ED and later transferred to [facility] cone due to worsening respiratory function.  1. Shortness of Breath This was most likely associated with COPD exacerbation versus asthma. She was agitated when she was put on CPAP and late she was managed with nasal canullar oxygen. Initial ABGs showed a pH of 7.17 while she was on 4 L of oxygen by nasal cannula. This morning, her oxygen saturations between 92 and 95% on 4 L by nasal cannula. Repeat ABG shows improvement in her  ABG with a pH of 7.25 PCO2-7 0.4, PO2 75, bicarbonate 72 and arterial oxygen saturation of 94%. Chest x-ray done yesterday showed stable cardiomegaly and chronic vascular congestion. Mildly  increased streaky left lower lobe opacity, likely atelectasis. Plan -PCCM has been contacted for further avaluation of this patient -Daily BMET, CBC and Mg -Keep on oxygen at 4L by nasal cannula and consider CPAP as needed if she desaturates further. However, the patient does not like CPAP or BiPAP. PCCM recommendations: -d/c Advair for now while in SDU, OK to restart when transfers out to floor  -start brovana (alformoterol) nebulizer bid  -start pulmicort nebulizer bid  -change prednisone to methylprednisone 80mg  IV q12  -continue other bronchodilators as you are doing  -  change metoprolol to bisoprolol  -will need instructions on Advair use prior to discharge  -when out on floor, will need home O2 eval to make sure her oxygen is dosed appropriately  -will need outpatient f/u with pulmonary  -will likely need tiotropium and roflumilast as outpatient if she can afford them  2. Right-sided heart failure. The patient is noted to have distended JVD of about 4 cm above the clavicle with a positive hepatal jugular reflex. She does not have edema of her extremities. This together with her shortness of breath suggest a possibility of right heart sided failure. She is noted to have elevated proBNP of 2500. She does not have a previous echocardiogram from review of the chart. Plan Do echocardiogram today. Will consider consultation with cardiology  3. Lung cancer  The patient reports history of non-small cell lung cancer in the right upper lobe which was treated by radiotherapy in 2011. I will contact Cancer center for records.   4. Alcohol abuse: She will assisted on this reduce alcohol abuse. She is a bit agitated on this admission and therefore will need to be handled cautiously on this issue.  5. Access  to medication Ms Cater her applied to for a medicare card which she has received but still has challenges access her inhalers. She also reports trouble with air-conditioner machine at home. I will involve a Child psychotherapist to assist her have these issues sorted out.  6. Code Status: The patient indicates that she does not wish to be put on a ventilator in the event of worsening shortness of breath. However, her decision seem not to be thoughtful because she gave a reflex response when I asked her about it. I will discuss this further with her at later time.     Length of Stay: 1 days   Signed by:  Dow Adolph PGY-I, Internal Medicine Pager [phone] 06/05/2012, 10:23 AM

## 2012-06-05 NOTE — Progress Notes (Signed)
IV started Lt wrist, number 22g. No complaints. Site unremarkable. Pt tolerated without event.

## 2012-06-05 NOTE — Progress Notes (Signed)
Oxygen saturation down and oxygen out of nose.  Pt states she will place it back when she finish eating.  Explained to pt oxygen level down to 74 and she needs her oxygen on, she still refuse to place Oxygen on. States, I will eat fast then place oxygen on. After placing oxygen on saturation up to 91. Cont to watch.

## 2012-06-05 NOTE — Progress Notes (Signed)
IV rt forearm kinked and out of vein.  No bleeding.  Site unremarkable, cath intact. Pt without complaints.

## 2012-06-06 ENCOUNTER — Telehealth: Payer: Self-pay | Admitting: *Deleted

## 2012-06-06 DIAGNOSIS — C3490 Malignant neoplasm of unspecified part of unspecified bronchus or lung: Secondary | ICD-10-CM

## 2012-06-06 DIAGNOSIS — J9620 Acute and chronic respiratory failure, unspecified whether with hypoxia or hypercapnia: Secondary | ICD-10-CM

## 2012-06-06 DIAGNOSIS — F17200 Nicotine dependence, unspecified, uncomplicated: Secondary | ICD-10-CM

## 2012-06-06 DIAGNOSIS — J441 Chronic obstructive pulmonary disease with (acute) exacerbation: Principal | ICD-10-CM

## 2012-06-06 DIAGNOSIS — J449 Chronic obstructive pulmonary disease, unspecified: Secondary | ICD-10-CM

## 2012-06-06 LAB — BLOOD GAS, ARTERIAL
Acid-Base Excess: 9.5 mmol/L — ABNORMAL HIGH (ref 0.0–2.0)
Bicarbonate: 32.1 mEq/L — ABNORMAL HIGH (ref 20.0–24.0)
Bicarbonate: 35.9 mEq/L — ABNORMAL HIGH (ref 20.0–24.0)
O2 Saturation: 94.4 %
Patient temperature: 98.6
TCO2: 34.4 mmol/L (ref 0–100)
TCO2: 38.2 mmol/L (ref 0–100)
pCO2 arterial: 74.8 mmHg (ref 35.0–45.0)
pH, Arterial: 7.256 — ABNORMAL LOW (ref 7.350–7.450)
pH, Arterial: 7.303 — ABNORMAL LOW (ref 7.350–7.450)
pO2, Arterial: 59.6 mmHg — ABNORMAL LOW (ref 80.0–100.0)

## 2012-06-06 LAB — BASIC METABOLIC PANEL
BUN: 27 mg/dL — ABNORMAL HIGH (ref 6–23)
CO2: 37 mEq/L — ABNORMAL HIGH (ref 19–32)
Chloride: 94 mEq/L — ABNORMAL LOW (ref 96–112)
GFR calc Af Amer: >90 mL/min (ref >90)
Glucose, Bld: 228 mg/dL — ABNORMAL HIGH (ref 70–99)
Potassium: 5.1 mEq/L (ref 3.5–5.1)

## 2012-06-06 LAB — CBC
HCT: 36.6 % (ref 36.0–46.0)
Hemoglobin: 10.6 g/dL — ABNORMAL LOW (ref 12.0–15.0)
MCV: 87.6 fL (ref 78.0–100.0)
RBC: 4.18 MIL/uL (ref 3.87–5.11)
RDW: 19.2 % — ABNORMAL HIGH (ref 11.5–15.5)
WBC: 6.2 10*3/uL (ref 4.0–10.5)

## 2012-06-06 MED ORDER — ENSURE COMPLETE PO LIQD
237.0000 mL | Freq: Two times a day (BID) | ORAL | Status: DC
  Administered 2012-06-07: 237 mL via ORAL

## 2012-06-06 MED ORDER — PREDNISONE 20 MG PO TABS
40.0000 mg | ORAL_TABLET | Freq: Every day | ORAL | Status: DC
  Administered 2012-06-07: 40 mg via ORAL
  Filled 2012-06-06 (×2): qty 2

## 2012-06-06 MED ORDER — BUDESONIDE 0.25 MG/2ML IN SUSP
0.2500 mg | Freq: Two times a day (BID) | RESPIRATORY_TRACT | Status: DC
  Administered 2012-06-06 – 2012-06-07 (×3): 0.25 mg via RESPIRATORY_TRACT
  Filled 2012-06-06 (×5): qty 2

## 2012-06-06 NOTE — Progress Notes (Signed)
Pt TX to 3W 3015, VSS, called report

## 2012-06-06 NOTE — Progress Notes (Signed)
Patient ID: Karina Weber, female   DOB: [birth year], 56 y.o.   MRN: [id number]     Subjective:    Interval Events:   [name] reports to be feeling better this morning. Her main complaint is weakness in her legs when she walks a short distance. Her oxygen drops to 84-86% when she is off oxygen. She is currently saturation in 93-98% on 4L of oxygen by nasal cannula. She had breakfast this morning. She reports gets short of breath with increased temperature in her room. She is frustrated because can not get her medication even though she has a medicare card. She gets conflicting information from many sources and she does not seem to get the help she needs which is her medication refills.    Objective:    Vital Signs:   Temp:  [97.4 F (36.3 C)-98.5 F (36.9 C)] 98.4 F (36.9 C) (09/04 1414) Pulse Rate:  [83-88] 88  (09/04 1414) Resp:  [19-34] 22  (09/04 1414) BP: (98-141)/(56-90) 107/66 mmHg (09/04 1414) SpO2:  [90 %-98 %] 90 % (09/04 1607) Last BM Date: 06/06/12   Weights: 24-hour Weight change:   Filed Weights   06/04/12 2331  Weight: 168 lb 3.4 oz (76.3 kg)     Intake/Output:   Intake/Output Summary (Last 24 hours) at 06/06/12 1813 Last data filed at 06/06/12 1232  Gross per 24 hour  Intake    480 ml  Output   1000 ml  Net   -520 ml       Physical Exam: General appearance: alert, cooperative and mild distress Head: Normocephalic, without obvious abnormality, atraumatic Eyes: negative, conjunctivae/corneas clear. PERRL, EOM's intact. Fundi benign. Neck: JVD - 4 cm above sternal notch and no JVD Resp: wheezes bibasilar and bilaterally Chest wall: no tenderness Cardio: regular rate and rhythm, S1, S2 normal, no murmur, click, rub or gallop GI: soft, non-tender; bowel sounds normal; no masses,  no organomegaly Extremities: extremities normal, atraumatic, no cyanosis or edema Pulses: 2+ and symmetric Neurologic: Alert and oriented X 3, normal strength and tone.  Normal symmetric reflexes. Normal coordination and gait    Labs: Basic Metabolic Panel:  Lab 06/06/12 4098 06/05/12 1342 06/04/12 1722  NA 138 136 138  K 5.1 5.5* 4.5  CL 94* 94* 99  CO2 37* 31 29  GLUCOSE 228* 200* 131*  BUN 27* 28* 22  CREATININE 0.54 0.70 0.79  CALCIUM 9.8 9.7 9.2  MG -- 1.9 --  PHOS -- -- --    Liver Function Tests:  Lab 06/04/12 1722  AST 16  ALT 11  ALKPHOS 161*  BILITOT 0.3  PROT 7.5  ALBUMIN 3.6   No results found for this basename: LIPASE:5,AMYLASE:5 in the last 168 hours No results found for this basename: AMMONIA:3 in the last 168 hours  CBC:  Lab 06/06/12 0545 06/04/12 1722  WBC 6.2 9.6  NEUTROABS -- 7.1  HGB 10.6* 11.0*  HCT 36.6 37.0  MCV 87.6 85.3  PLT 301 349    Cardiac Enzymes: No results found for this basename: CKTOTAL:5,CKMB:5,CKMBINDEX:5,TROPONINI:5 in the last 168 hours  BNP: No components found with this basename: POCBNP:5  CBG: No results found for this basename: GLUCAP:5 in the last 168 hours  Coagulation Studies: No results found for this basename: LABPROT:5,INR:5 in the last 72 hours  Microbiology: Results for orders placed during the hospital encounter of 06/04/12  MRSA PCR SCREENING     Status: Normal   Collection Time   06/04/12 11:20 PM  Component Value Range Status Comment   MRSA by PCR NEGATIVE  NEGATIVE Final       Imaging: No results found.    Medications:    Infusions:     Scheduled Medications:    . albuterol  5 mg Nebulization Q4H  . amLODipine  10 mg Oral Daily  . arformoterol  15 mcg Nebulization BID  . aspirin  81 mg Oral Daily  . bisoprolol  5 mg Oral Daily  . budesonide  0.25 mg Nebulization BID  . doxycycline  100 mg Oral Q12H  . enoxaparin (LOVENOX) injection  40 mg Subcutaneous Q24H  . Ensure Plus  1 Can Oral BID  . guaiFENesin  600 mg Oral BID  . hydrochlorothiazide  12.5 mg Oral Daily  . ipratropium  0.5 mg Nebulization Q4H  . mirtazapine  15 mg Oral QHS  .  predniSONE  40 mg Oral Q breakfast  . sodium chloride  3 mL Intravenous Q12H  . DISCONTD: methylPREDNISolone (SOLU-MEDROL) injection  80 mg Intravenous Q12H     PRN Medications: ondansetron (ZOFRAN) IV, ondansetron, traZODone   Assessment/ Plan:   This is a 56 year old woman with past medical history significant for COPD with multiple admission for COPD exercabations, lung cancer, alcohol abuse and asthma, who was admitted yesterday with acute on chronic respiratory failure with a of 7.17. She reports that she ran out of her medications including inhalers for a couple days and developed severe shortness of breath after her AC machine at home went down. She was initially evaluated in [facility] ED and later transferred to [facility] cone due to worsening respiratory function.   1. Shortness of Breath  This was most likely associated with COPD exacerbation versus asthma. She was agitated when she was put on CPAP and late she was managed with nasal canullar oxygen. Initial ABGs showed a pH of 7.17 while she was on 4 L of oxygen by nasal cannula. This morning, her oxygen saturations between 92 and 95% on 4 L by nasal cannula. Repeat ABG shows improvement in her ABG with a pH of 7.25 PCO2-7 0.4, PO2 75, bicarbonate 72 and arterial oxygen saturation of 94%. Chest x-ray done yesterday showed stable cardiomegaly and chronic vascular congestion. Mildly  increased streaky left lower lobe opacity, likely atelectasis.  Plan  -She doing bette. She can be transferred to the floor on Oxygen -Daily BMET,  -Keep on oxygen at 4L by nasal cannula and consider CPAP as needed if she desaturates further. However, the patient does not like CPAP or BiPAP.  PCCM recommendations:  - will start Advair on floor transfers out to floor  -continue brovana (alformoterol) nebulizer bid  -continue with  pulmicort nebulizer bid  - change methylprednisone 80mg  IV q12 to oral Predinisone 40mg    -continue with bisoprolol    - to follow up with home O2 eval to make sure her oxygen is dosed appropriately  -will arrange for pulmonary follow up -will likely need tiotropium and roflumilast as outpatient if she can afford them   2. Right-sided heart failure.  The patient is noted to have distended JVD of about 4 cm above the clavicle with a positive hepatal jugular reflex. She does not have edema of her extremities. This together with her shortness of breath suggest a possibility of right heart sided failure. She is noted to have elevated proBNP of 2500. She does not have a previous echocardiogram from review of the chart.  Plan  Do echocardiogram shows normal LV  function but RV high pressure with high PA of consistent with severe pulmonary hypertension resulting from COPD and Asthma Will consider consultation with cardiology   3. Lung cancer  The patient reports history of non-small cell lung cancer in the right upper lobe which was treated by radiotherapy in 2011. I will contact Cancer center for records.   4. Alcohol abuse:  She will assisted on this reduce alcohol abuse. She is a bit agitated on this admission and therefore will need to be handled cautiously on this issue.   5. Access to medication  Ms Pointer her applied to for a medicare card which she has received but still has challenges access her inhalers. She also reports trouble with air-conditioner machine at home. I will involve a Child psychotherapist and Sports coach for this.  discuss this further with her at later time.   Length of Stay: 2 days   Signed by:  Dow Adolph PGY-I, Internal Medicine Pager [phone] 06/06/2012, 6:13 PM

## 2012-06-06 NOTE — Progress Notes (Signed)
CRITICAL VALUE ALERT  Critical value received:  PCO2 74.8  Date of notification:  06-06-12  Time of notification:  0415  Critical value read back:yes  Nurse who received alert:  Maximino Greenland  MD notified (1st page):  Collier Bullock  Time of first page:  0440  MD notified (2nd page):  Time of second page:  Responding MD:  Collier Bullock  Time MD responded:  0445  No new orders recieved

## 2012-06-06 NOTE — Telephone Encounter (Signed)
Pt is still inpatient.  Will route to triage to hold until patient is discharged to schedule HFU.

## 2012-06-06 NOTE — Telephone Encounter (Signed)
Message copied by Lowell Bouton on Wed Jun 06, 2012 11:43 AM ------      Message from: Veto Kemps B      Created: Wed Jun 06, 2012  9:32 AM       Can we make a hospital follow up for this lady in 2-3 weeks?  She lives in [place] so dosen't want to go to [place].  Has seen Dr. Craige Cotta before in hospital.      Thanks

## 2012-06-06 NOTE — Progress Notes (Signed)
Name: Karina Weber MRN: [id number] DOB: 08-Nov-1955  LOS: 2  [place] PULMONARY CONSULT NOTE  Brief Patient Description: 56 y/o female with COPD, non-small cell lung cancer and alcohol abuse who was admitted through the [facility] ED on 06/04/2012 with AE COPD.  She required BIPAP overnight on admission.   Lines / Drains: Peripheral IV  Cultures / Sepsis markers:   Antibiotics: 06/04/2012 Doxycycline (AE COPD) >>9/3   Tests / Events: 06/04/12 Portable CXR>> slightly rotated, cardiomegaly, R hilar lymphadenopathy 06/04/12 TTE >>RV mod dilated, mildly reduced function; mod RA dilation; LVEF 55-60%, grade I diastolic dysfunction  Overnight: Improved breathing, feels well this morning    Vital Signs:   Filed Vitals:   06/06/12 0355 06/06/12 0400 06/06/12 0704 06/06/12 0734  BP:  115/62    Pulse:  88    Temp:  98.5 F (36.9 C) 98.2 F (36.8 C)   TempSrc:  Oral Oral   Resp:  22    Height:      Weight:      SpO2: 93% 93%  98%    Physical Examination: Gen: chronically ill appearing, comfortable HEENT: NCAT, PERRL, EOMi, OP clear,  Neck: supple without masses PULM: Wheezing improved CV: RRR, no mgr, no JVD AB: BS+, soft, nontender, no hsm Ext: warm, no edema, no clubbing, no cyanosis   Labs and Imaging:   CBC    Component Value Date/Time   WBC 6.2 06/06/2012 0545   RBC 4.18 06/06/2012 0545   HGB 10.6* 06/06/2012 0545   HCT 36.6 06/06/2012 0545   PLT 301 06/06/2012 0545   MCV 87.6 06/06/2012 0545   MCH 25.4* 06/06/2012 0545   MCHC 29.0* 06/06/2012 0545   RDW 19.2* 06/06/2012 0545   LYMPHSABS 1.8 06/04/2012 1722   MONOABS 0.7 06/04/2012 1722   EOSABS 0.1 06/04/2012 1722   BASOSABS 0.0 06/04/2012 1722    BMET    Component Value Date/Time   NA 138 06/06/2012 0545   K 5.1 06/06/2012 0545   CL 94* 06/06/2012 0545   CO2 37* 06/06/2012 0545   GLUCOSE 228* 06/06/2012 0545   BUN 27* 06/06/2012 0545   CREATININE 0.54 06/06/2012 0545   CALCIUM 9.8 06/06/2012 0545   GFRNONAA >90 06/06/2012 0545   GFRAA >90 06/06/2012  0545    ABG    Component Value Date/Time   PHART 7.303* 06/06/2012 0403   PCO2ART 74.8* 06/06/2012 0403   PO2ART 59.6* 06/06/2012 0403   HCO3 35.9* 06/06/2012 0403   TCO2 38.2 06/06/2012 0403   ACIDBASEDEF 0.3 06/04/2012 1827   O2SAT 88.5 06/06/2012 0403   EKG 9/2: NSR, Biatrial enlargement, no ST wave changes  Assessment and Plan:   COPD with acute exacerbation (09/04/2007)   Assessment: severe COPD, frequent hospitalizations   Plan:  -would consult social work to help her replace her home A/C unit as symptoms started with the hot weather -restart Advair, have respiratory review technique -start Spiriva -stop brovana and pulmicort -decrease solumedrol to prednisone 40mg , tapered over 10 day course -continue bisoprolol -prior to discharge, will need home O2 eval to make sure her oxygen is dosed appropriately -will need outpatient f/u with pulmonary including PFT's (I have sent a message to [facility] for this, can call [phone] prior to discharge for appointment date/time)  Lung Cancer   Assessment:    Plan: -would obtain records from cancer center  TOBACCO USE (09/04/2007)   Assessment: contributing to her AE COPD   Plan: -encouraged to quit  Will sign off, call  if questions.  Heber Chester, M.D. Pulmonary and Critical Care Medicine [facility] Pager: [phone]  06/06/2012, 9:25 AM

## 2012-06-06 NOTE — Progress Notes (Signed)
Inpatient Diabetes Program Recommendations  AACE/ADA: New Consensus Statement on Inpatient Glycemic Control (2013)  Target Ranges:  Prepandial:   less than 140 mg/dL      Peak postprandial:   less than 180 mg/dL (1-2 hours)      Critically ill patients:  140 - 180 mg/dL   Results for LANDEN, BREELAND (MRN [id number]) as of 06/06/2012 09:29  Ref. Range 06/05/2012 13:42 06/06/2012 05:45  Glucose Latest Range: 70-99 mg/dL 562 (H) 130 (H)   2 elevated labs. Patient on IV steroids.  Inpatient Diabetes Program Recommendations Correction (SSI): Please check CBGs and cover with Novolog Sensitive SSi tid ac + HS if CBGs elevated.  Note: Will follow. Ambrose Finland RN, MSN, CDE Diabetes Coordinator Inpatient Diabetes Program [phone]

## 2012-06-07 DIAGNOSIS — J189 Pneumonia, unspecified organism: Secondary | ICD-10-CM

## 2012-06-07 MED ORDER — PREDNISONE 20 MG PO TABS
40.0000 mg | ORAL_TABLET | Freq: Every day | ORAL | Status: DC
  Filled 2012-06-07: qty 2

## 2012-06-07 MED ORDER — ALBUTEROL SULFATE (5 MG/ML) 0.5% IN NEBU
2.5000 mg | INHALATION_SOLUTION | Freq: Three times a day (TID) | RESPIRATORY_TRACT | Status: DC
  Administered 2012-06-07: 2.5 mg via RESPIRATORY_TRACT
  Filled 2012-06-07: qty 0.5

## 2012-06-07 MED ORDER — ASPIRIN 81 MG PO TABS: 81.0000 mg | ORAL_TABLET | Freq: Every day | ORAL | Status: DC

## 2012-06-07 MED ORDER — TIOTROPIUM BROMIDE MONOHYDRATE 18 MCG IN CAPS: 18.0000 ug | ORAL_CAPSULE | Freq: Every day | RESPIRATORY_TRACT | Status: DC

## 2012-06-07 MED ORDER — ALBUTEROL SULFATE (5 MG/ML) 0.5% IN NEBU: 5.0000 mg | INHALATION_SOLUTION | Freq: Three times a day (TID) | RESPIRATORY_TRACT | Status: DC

## 2012-06-07 MED ORDER — PREDNISONE 10 MG PO TABS: 10.0000 mg | ORAL_TABLET | Freq: Every day | ORAL | Status: DC

## 2012-06-07 MED ORDER — PREDNISONE 20 MG PO TABS: 20.0000 mg | ORAL_TABLET | Freq: Every day | ORAL | Status: AC

## 2012-06-07 MED ORDER — PREDNISONE 20 MG PO TABS: 20.0000 mg | ORAL_TABLET | Freq: Every day | ORAL | Status: DC

## 2012-06-07 MED ORDER — PREDNISONE 20 MG PO TABS: 40.0000 mg | ORAL_TABLET | Freq: Every day | ORAL | Status: AC

## 2012-06-07 MED ORDER — BISOPROLOL FUMARATE 5 MG PO TABS: 5.0000 mg | ORAL_TABLET | Freq: Every day | ORAL | Status: DC

## 2012-06-07 MED ORDER — ALBUTEROL SULFATE (5 MG/ML) 0.5% IN NEBU
2.5000 mg | INHALATION_SOLUTION | RESPIRATORY_TRACT | Status: DC | PRN
  Administered 2012-06-07: 2.5 mg via RESPIRATORY_TRACT

## 2012-06-07 NOTE — Progress Notes (Signed)
Internal Medicine Teaching service attending Dr.Tedric Leeth. Patient seen today and her management evaluated with the resident team. She looks back to her baseline and her ABG is much improved since admission and we have her on the new regimen proposed by Pulm. She should be ready to go home once her home situation and her medication refills are sorted out. She looks medically better.

## 2012-06-07 NOTE — Care Management Note (Signed)
    Page 1 of 2   06/07/2012     2:36:35 PM   CARE MANAGEMENT NOTE 06/07/2012  Patient:  Karina Weber, Karina Weber   Account Number:  [id number]  Date Initiated:  06/05/2012  Documentation initiated by:  Donn Pierini  Subjective/Objective Assessment:   Pt admitted with resp. failure     Action/Plan:   PTA pt lived at home alone   Anticipated DC Date:  06/07/2012   Anticipated DC Plan:  HOME/SELF CARE      DC Planning Services  CM consult      [facility] Choice  HOME HEALTH   Choice offered to / List presented to:  C-1 Patient   DME arranged  [facility]GEN      DME agency  OTHER - SEE NOTE     HH arranged  HH-1 RN  HH-10 DISEASE MANAGEMENT  HH-6 SOCIAL WORKER      HH agency  [facility]   Status of service:  Completed, signed off Medicare Important Message given?   (If response is "NO", the following Medicare IM given date fields will be blank) Date Medicare IM given:   Date Additional Medicare IM given:    Discharge Disposition:  HOME W HOME HEALTH SERVICES  Per UR Regulation:  Reviewed for med. necessity/level of care/duration of stay  If discussed at Long Length of Stay Meetings, dates discussed:    Comments:  PCP- [place], CHRISTOPHER  06-07-12 [address], [place] [phone] CM did find the 02 DME co [facility] in [place] [phone]). CM will fax 02 orders to co. Pt has nebulizer at home. She previously used [facility] protection practitioner on [address]. The pharmacy is now closed and pt is using [facility] and they deliver. CM asked MD to send medications to them at d/c. [facility] will bring 02 to hospital room before d/c. CM made referral for above services with [facility]. SOC to begin within 24-48 ours post d/c.

## 2012-06-07 NOTE — Progress Notes (Signed)
Pt was ambulated around the hall. On 4 L the pt's O2 sats ranged from 92-94%. When the pt's Oxygen level was decreased to 3L the pt's sats ranged from 89-90%. When the oxygen was decreased to 2L the pt's O2 sats dropped to 83% with deep breaths & coughing still didn't come up until the pt's Oxygen was increased back to 4L and her sats went back up to 92-94%. Karina Weber

## 2012-06-07 NOTE — Discharge Summary (Signed)
Internal Medicine Teaching [facility] Discharge Note  Name: Karina Weber MRN: [id number] DOB: 11/04/1955 56 y.o.  Date of Admission: 06/04/2012  4:23 PM Date of Discharge: 06/07/2012 Attending Physician: Karina Heap, MD  Discharge Diagnosis:  Acute-on-chronic respiratory failure  COPD with acute exacerbation Vs Acute Asthma  Right heart Failure  ESSENTIAL HYPERTENSION  ALCOHOLISM  TOBACCO USE  Protein calorie malnutrition  Difficult Home Situation   Discharge Medications: Medication List  As of 06/07/2012  4:30 PM   STOP taking these medications         albuterol (2.5 MG/3ML) 0.083% nebulizer solution      amLODipine 10 MG tablet      hydrochlorothiazide 12.5 MG capsule      ipratropium 0.02 % nebulizer solution      metoprolol tartrate 25 MG tablet      traZODone 100 MG tablet         TAKE these medications         albuterol 108 (90 BASE) MCG/ACT inhaler   Commonly known as: PROVENTIL HFA;VENTOLIN HFA   Inhale 1 puff into the lungs every 4 (four) hours as needed for wheezing.      aspirin 81 MG tablet   Take 1 tablet (81 mg total) by mouth daily.      bisoprolol 5 MG tablet   Commonly known as: ZEBETA   Take 1 tablet (5 mg total) by mouth daily.      Fluticasone-Salmeterol 250-50 MCG/DOSE Aepb   Commonly known as: ADVAIR   Inhale 1 puff into the lungs every 12 (twelve) hours.      mirtazapine 15 MG tablet   Commonly known as: REMERON   Take 1 tablet (15 mg total) by mouth at bedtime.      predniSONE 20 MG tablet   Commonly known as: DELTASONE   Take 2 tablets (40 mg total) by mouth daily with breakfast.      predniSONE 20 MG tablet   Commonly known as: DELTASONE   Take 1 tablet (20 mg total) by mouth daily with breakfast.      predniSONE 10 MG tablet   Commonly known as: DELTASONE   Take 1 tablet (10 mg total) by mouth daily with breakfast.      tiotropium 18 MCG inhalation capsule   Commonly known as: SPIRIVA   Place 1 capsule (18 mcg  total) into inhaler and inhale daily.            Disposition and follow-up:   [name] was discharged from [facility] in Stable condition.   At the hospital follow up visit please address:  Please find out whether she he received her home medications in mail.  Recheck blood pressure and make changes to her medication. Amlodipine and hydrochlorothiazide have been discontinued during admission.  Please find out from the patient whether she received assistance with her air conditioner on oxygen cylinder as this situation triggered her COPD exacerbation.  Encourage her to followup with pulmonology for PFTs and hospital follow up.  Follow-up Appointments: Follow-up Information    Follow up with PRIBULA,CHRISTOPHER, MD on 06/14/2012. (at 9:30am)    Contact information:   [address] [place] [address] [phone]       Follow up with Karina Hughs, MD on 07/06/2012. (at 9am for PFTS and F/U)    Contact information:   [address] [facility] [address] [place] [address] [place] [address] [phone]  Discharge Orders    Future Appointments: Provider: Department: Dept Phone: Center:   06/14/2012 9:30 AM Karina Sias, MD [facility] [phone] [facility]   07/06/2012 9:00 AM [facility] [phone] None   07/06/2012 10:00 AM Karina Cowden, MD [facility] [phone] None     Future Orders Please Complete By Expires   For home use only DME oxygen      Questions: Responses:   Mode or (Route) Nasal cannula   Liters per Minute 3.5   Frequency Continuous   Diet - low sodium heart healthy      Increase activity slowly      Discharge instructions      Comments:   Please take all your medications as prescribed Please follow up with your appointments as indicated above If you have any questions or concern, please call the clinic on [phone]   Call MD for:   temperature >100.4      Call MD for:  redness, tenderness, or signs of infection (pain, swelling, redness, odor or green/yellow discharge around incision site)      Call MD for:  difficulty breathing, headache or visual disturbances      (HEART FAILURE PATIENTS) Call MD:  Anytime you have any of the following symptoms: 1) 3 pound weight gain in 24 hours or 5 pounds in 1 week 2) shortness of breath, with or without a dry hacking cough 3) swelling in the hands, feet or stomach 4) if you have to sleep on extra pillows at night in order to breathe.         Consultations:  PCCM   Procedures Performed:  Dg Chest Port 1 View  06/04/2012  *RADIOLOGY REPORT*  Clinical Data: Shortness of breath.  PORTABLE CHEST - 1 VIEW  Comparison: Radiographs 04/14/2012 and CT 04/17/2012.  Findings: 1705 hours.  Cardiomegaly and generalized interstitial prominence are stable.  There is increased streaky left lower lobe opacity, most consistent with atelectasis.  There is no focal airspace disease or significant pleural effusion.  Telemetry leads overlie the chest.  IMPRESSION: Stable cardiomegaly and chronic vascular congestion.  Mildly increased streaky left lower lobe opacity, likely atelectasis.   Original Report Authenticated By: Gerrianne Scale, M.D.     Admission HPI: [name] is a 56 year old lady with a history of asthma, ? non-small cell lung cancer (right upper lobe, s/p radiation), alcohol abuse, COPD, hypertension, who presented to the [facility] ED with complaints of shortness of breath with a respiratory acidosis (pH of 7.17). She states that the air conditioner was not working in her apartment and this seems to be what triggered her shortness of breath. She reports that she had ran out of her usual inhaler a few days prior to exacerbation of her shortness of breath. She recently applied for Medicaid and received card, but she is unsure how she is supposed to use it to receive her medications. She is on home  oxygen therapy but her oxygen cylinder is not working well. She denied history of fever or chills.  Hospital Course by problem list: Acute-on-chronic respiratory failure: [name] was admitted with acute respiratory failure with ABG revealing very low, pH of 7.7, hypercapnia of PCO2 of 83.1, and PO2 = 74%. Oxygen saturation was 95% on 15 L of oxygen she was transitioned down after receiving nebulizing thrapy to 4 L of oxygen for nasal cannula. On arrival to the ED, the patient refused to use CPAP. She was started  on oxygen 4 L by nasal cannula and subsequent ABGs improved.PCCM was consulted and made recommendations on her bronchodilators but no invasive ventilation therapy was initiated because she was clinically improving on nasal oxygen. PH improved from 7.17 to 7.25 to 7.30 which is around her baseline and due to chronic respiratory acidosis secondary to COPD and asthma. Home oxygen requirement was set at 4 L a minute after exercise evaluation.  PCCM recommended restarting Advair and Spiriva, and a tapering does of prednisone. Brovana and Pulmicort were discontinued. She will follow up with Dr Heber Dent of pulmonology for followup on PFTs. Home care has been contacted to assist her get her medication, fix her air conditioner and oxygen cylinder. She will follow up in outpatient clinic for evaluation as well. Right heart Failure: Ms Karina Weber was found to have a raised JVD accompanying the shortness of breath. Echocardiogram revealed normal left ventricular function with ejection fraction of 55-60% without abnormal wall motion. However, the right ventricle was dilated with increased right ventricular volume, and pressure overload. Pulmonary pressures were high at 72 mmHg. The findings are consistent with right-sided heart failure, resulting from COPD and asthma exacerbation. Cardiology was not consulted during this admission. She was started on bisoprolol. Her home metotoprol was discontinued. Bisoprolol  appears have less broncho-constriction activity. This will be follow up with IM clinic appointment. Essential hypertension: Blood pressure is on admission while 124/63. Her home medications for hypertension include amlodipine 10 mg once daily, hydrochlorothiazide 12.5mg  given this low blood pressure on admission her amlodipine and hydrochlorothiazide were discontinued. Her blood pressures on discharge was still soft and, therefore, she was only discharged on bisoprolol. Her medications should be reviewed as outpatient on the next appointment Difficult Home Situation: Patient reports difficulty with transportation, a.l. condition in her house, and her oxygen cylinder. Home health to follow up and assist her with these issues. Medications will be delivered by mail.  Discharge Vitals:  BP 112/72  Pulse 87  Temp 98.6 F (37 C) (Oral)  Resp 18  Ht 5\' 4"  (1.626 m)  Wt 168 lb 3.4 oz (76.3 kg)  BMI 28.87 kg/m2  SpO2 89%  Discharge Labs: No results found for this or any previous visit (from the past 24 hour(s)).  Signed: Dow Adolph 06/07/2012, 4:30 PM   Time Spent on Discharge: 

## 2012-06-11 ENCOUNTER — Encounter: Payer: Self-pay | Admitting: Internal Medicine

## 2012-06-11 ENCOUNTER — Telehealth: Payer: Self-pay | Admitting: *Deleted

## 2012-06-11 NOTE — Telephone Encounter (Signed)
Many attempts have been made to help Karina Weber understand the severity of her disease but she continues to have problems both getting her medications as well as taking them as well as compliance with her home oxygen.  We will need to continue to encourage adherence to her medications and treatment regimens but ultimately it is her choice.

## 2012-06-11 NOTE — Telephone Encounter (Signed)
HHN calls and states pt did not have home 02 on when she arrived this am and 02 sats were 79%, she states pt states she was up getting dressed and cannot wear 02 to travel around house, she states pt will be walking to the grocery this am for her weekly food and will not have 02 on then either, she is very worried for pt, seeing that the stressful activity will make 02 sats worse.  i will send this to shanda the social worker, dr Tonny Branch and attending for advisement

## 2012-06-11 NOTE — Telephone Encounter (Signed)
LMTCB

## 2012-06-11 NOTE — Telephone Encounter (Signed)
Karina Weber has an appt on the 12th with Dr Tonny Branch and can be addressed then. I am not surprised by her not wearing O2 - my last interaction with her was when I saw her in the elevator just a day or two after D/C and she wasn't wearing her O2 despite very low O2 while hospitalized.

## 2012-06-12 NOTE — Telephone Encounter (Signed)
CSW placed call to Ms. Polcyn to inquire if referral to [facility] would be of benefit.  Pt has traditional Medicare.  CSW left message on pt's home number requesting return call.

## 2012-06-14 ENCOUNTER — Ambulatory Visit: Payer: Medicare Other | Admitting: Internal Medicine

## 2012-06-20 ENCOUNTER — Other Ambulatory Visit: Payer: Self-pay | Admitting: Internal Medicine

## 2012-06-20 DIAGNOSIS — C3490 Malignant neoplasm of unspecified part of unspecified bronchus or lung: Secondary | ICD-10-CM

## 2012-06-20 DIAGNOSIS — J449 Chronic obstructive pulmonary disease, unspecified: Secondary | ICD-10-CM

## 2012-06-20 DIAGNOSIS — J9612 Chronic respiratory failure with hypercapnia: Secondary | ICD-10-CM

## 2012-06-22 ENCOUNTER — Other Ambulatory Visit: Payer: Self-pay | Admitting: Internal Medicine

## 2012-06-22 ENCOUNTER — Telehealth: Payer: Self-pay | Admitting: Licensed Clinical Social Worker

## 2012-06-22 DIAGNOSIS — E46 Unspecified protein-calorie malnutrition: Secondary | ICD-10-CM

## 2012-06-22 DIAGNOSIS — F1020 Alcohol dependence, uncomplicated: Secondary | ICD-10-CM

## 2012-06-22 DIAGNOSIS — J449 Chronic obstructive pulmonary disease, unspecified: Secondary | ICD-10-CM

## 2012-06-22 DIAGNOSIS — J9612 Chronic respiratory failure with hypercapnia: Secondary | ICD-10-CM

## 2012-06-22 NOTE — Telephone Encounter (Signed)
Ms. Zirkle was referred to CSW for referral to [facility].  CSW had previously called Ms. Billmeyer regarding potential referral to [facility] and received no return call.  CSW will continue to attempt to contact Ms. Barua.  CSW placed call to [facility] to confirm if pt was currently open, confirmed pt is receiving [facility] RN.  Will request HH CSW to assist pt with community resources in the home.  CSW will forward pt's information to [facility] to determine if referral is appropriate.

## 2012-06-25 ENCOUNTER — Other Ambulatory Visit: Payer: Self-pay | Admitting: *Deleted

## 2012-06-25 NOTE — Telephone Encounter (Signed)
[facility] is familiar with pt's case and will confirm pt is open with [facility].  Referral for [facility] CSW faxed to [facility].

## 2012-06-25 NOTE — Telephone Encounter (Signed)
She is not supposed to be on chronic prednisone.  She was discharged from the hospital with a 10 day taper.  If she needs a refill of the prednisone then she needs to be evaluated in the clinic before we talk about doing another round of steroids.

## 2012-07-05 ENCOUNTER — Institutional Professional Consult (permissible substitution): Payer: Medicare Other | Admitting: Pulmonary Disease

## 2012-07-06 ENCOUNTER — Institutional Professional Consult (permissible substitution): Payer: Medicare Other | Admitting: Internal Medicine

## 2012-07-09 ENCOUNTER — Emergency Department (HOSPITAL_COMMUNITY): Payer: Medicare Other

## 2012-07-09 ENCOUNTER — Inpatient Hospital Stay (HOSPITAL_COMMUNITY)
Admission: EM | Admit: 2012-07-09 | Discharge: 2012-07-12 | DRG: 208 | Disposition: A | Discharge status: Home Health | Payer: Medicare Other | Attending: Internal Medicine | Admitting: Internal Medicine

## 2012-07-09 ENCOUNTER — Encounter (HOSPITAL_COMMUNITY): Payer: Self-pay | Admitting: Unknown Physician Specialty

## 2012-07-09 DIAGNOSIS — I10 Essential (primary) hypertension: Secondary | ICD-10-CM | POA: Diagnosis present

## 2012-07-09 DIAGNOSIS — R0902 Hypoxemia: Secondary | ICD-10-CM | POA: Diagnosis present

## 2012-07-09 DIAGNOSIS — F1410 Cocaine abuse, uncomplicated: Secondary | ICD-10-CM | POA: Diagnosis present

## 2012-07-09 DIAGNOSIS — F17200 Nicotine dependence, unspecified, uncomplicated: Secondary | ICD-10-CM | POA: Diagnosis present

## 2012-07-09 DIAGNOSIS — J9600 Acute respiratory failure, unspecified whether with hypoxia or hypercapnia: Secondary | ICD-10-CM | POA: Diagnosis present

## 2012-07-09 DIAGNOSIS — E46 Unspecified protein-calorie malnutrition: Secondary | ICD-10-CM | POA: Diagnosis present

## 2012-07-09 DIAGNOSIS — J441 Chronic obstructive pulmonary disease with (acute) exacerbation: Secondary | ICD-10-CM | POA: Diagnosis present

## 2012-07-09 DIAGNOSIS — I509 Heart failure, unspecified: Secondary | ICD-10-CM | POA: Diagnosis present

## 2012-07-09 DIAGNOSIS — F1910 Other psychoactive substance abuse, uncomplicated: Secondary | ICD-10-CM | POA: Diagnosis present

## 2012-07-09 DIAGNOSIS — J449 Chronic obstructive pulmonary disease, unspecified: Secondary | ICD-10-CM

## 2012-07-09 DIAGNOSIS — F3289 Other specified depressive episodes: Secondary | ICD-10-CM | POA: Diagnosis present

## 2012-07-09 DIAGNOSIS — G9340 Encephalopathy, unspecified: Secondary | ICD-10-CM | POA: Diagnosis present

## 2012-07-09 DIAGNOSIS — D649 Anemia, unspecified: Secondary | ICD-10-CM | POA: Diagnosis present

## 2012-07-09 DIAGNOSIS — J189 Pneumonia, unspecified organism: Secondary | ICD-10-CM

## 2012-07-09 DIAGNOSIS — Z681 Body mass index (BMI) 19 or less, adult: Secondary | ICD-10-CM

## 2012-07-09 DIAGNOSIS — Z85118 Personal history of other malignant neoplasm of bronchus and lung: Secondary | ICD-10-CM

## 2012-07-09 DIAGNOSIS — J9620 Acute and chronic respiratory failure, unspecified whether with hypoxia or hypercapnia: Principal | ICD-10-CM | POA: Diagnosis present

## 2012-07-09 DIAGNOSIS — J45901 Unspecified asthma with (acute) exacerbation: Secondary | ICD-10-CM | POA: Diagnosis present

## 2012-07-09 DIAGNOSIS — J40 Bronchitis, not specified as acute or chronic: Secondary | ICD-10-CM | POA: Diagnosis present

## 2012-07-09 DIAGNOSIS — I498 Other specified cardiac arrhythmias: Secondary | ICD-10-CM | POA: Diagnosis present

## 2012-07-09 DIAGNOSIS — Z9981 Dependence on supplemental oxygen: Secondary | ICD-10-CM

## 2012-07-09 DIAGNOSIS — Z7982 Long term (current) use of aspirin: Secondary | ICD-10-CM

## 2012-07-09 DIAGNOSIS — J9690 Respiratory failure, unspecified, unspecified whether with hypoxia or hypercapnia: Secondary | ICD-10-CM

## 2012-07-09 DIAGNOSIS — F329 Major depressive disorder, single episode, unspecified: Secondary | ICD-10-CM | POA: Diagnosis present

## 2012-07-09 LAB — COMPREHENSIVE METABOLIC PANEL
ALT: 8 U/L (ref 0–35)
AST: 12 U/L (ref 0–37)
AST: 14 U/L (ref 0–37)
Albumin: 3.6 g/dL (ref 3.5–5.2)
Albumin: 3.6 g/dL (ref 3.5–5.2)
CO2: 29 mEq/L (ref 19–32)
Calcium: 10.2 mg/dL (ref 8.4–10.5)
Chloride: 98 mEq/L (ref 96–112)
Chloride: 100 mEq/L (ref 96–112)
Creatinine, Ser: 0.58 mg/dL (ref 0.50–1.10)
GFR calc non Af Amer: >90 mL/min (ref >90)
Potassium: 4.3 mEq/L (ref 3.5–5.1)
Sodium: 138 mEq/L (ref 135–145)
Total Bilirubin: 0.2 mg/dL — ABNORMAL LOW (ref 0.3–1.2)
Total Protein: 8.2 g/dL (ref 6.0–8.3)

## 2012-07-09 LAB — CBC WITH DIFFERENTIAL/PLATELET
Basophils Absolute: 0 10*3/uL (ref 0.0–0.1)
Basophils Absolute: 0 10*3/uL (ref 0.0–0.1)
Basophils Relative: 0 % (ref 0–1)
Eosinophils Absolute: 0 10*3/uL (ref 0.0–0.7)
HCT: 41.7 % (ref 36.0–46.0)
Lymphocytes Relative: 18 % (ref 12–46)
Lymphs Abs: 0.2 10*3/uL — ABNORMAL LOW (ref 0.7–4.0)
MCHC: 29.3 g/dL — ABNORMAL LOW (ref 30.0–36.0)
MCV: 88.5 fL (ref 78.0–100.0)
Monocytes Absolute: 0.2 10*3/uL (ref 0.1–1.0)
Monocytes Relative: 2 % — ABNORMAL LOW (ref 3–12)
Neutro Abs: 8.1 10*3/uL — ABNORMAL HIGH (ref 1.7–7.7)
Neutrophils Relative %: 68 % (ref 43–77)
Platelets: 320 10*3/uL (ref 150–400)
Platelets: 266 10*3/uL (ref 150–400)
RDW: 22 % — ABNORMAL HIGH (ref 11.5–15.5)
RDW: 22 % — ABNORMAL HIGH (ref 11.5–15.5)
WBC: 11.9 10*3/uL — ABNORMAL HIGH (ref 4.0–10.5)
WBC: 10.3 10*3/uL (ref 4.0–10.5)

## 2012-07-09 LAB — PROCALCITONIN: Procalcitonin: 0.25 ng/mL

## 2012-07-09 LAB — URINALYSIS, MICROSCOPIC ONLY
Bilirubin Urine: NEGATIVE
Glucose, UA: NEGATIVE mg/dL
Nitrite: NEGATIVE
Specific Gravity, Urine: 1.021 (ref 1.005–1.030)
pH: 6 (ref 5.0–8.0)

## 2012-07-09 LAB — POCT I-STAT TROPONIN I: Troponin i, poc: 0 ng/mL (ref 0.00–0.08)

## 2012-07-09 LAB — POCT I-STAT 3, ART BLOOD GAS (G3+)
Acid-Base Excess: 4 mmol/L — ABNORMAL HIGH (ref 0.0–2.0)
Bicarbonate: 37.4 mEq/L — ABNORMAL HIGH (ref 20.0–24.0)
Bicarbonate: 32.9 mEq/L — ABNORMAL HIGH (ref 20.0–24.0)
O2 Saturation: 94 %
Patient temperature: 98.8
Patient temperature: 98.6
TCO2: 41 mmol/L (ref 0–100)
pH, Arterial: 7.259 — ABNORMAL LOW (ref 7.350–7.450)

## 2012-07-09 LAB — LACTIC ACID, PLASMA: Lactic Acid, Venous: 0.9 mmol/L (ref 0.5–2.2)

## 2012-07-09 LAB — PROTIME-INR
INR: 1 (ref 0.00–1.49)
Prothrombin Time: 13.1 seconds (ref 11.6–15.2)

## 2012-07-09 LAB — PRO B NATRIURETIC PEPTIDE: Pro B Natriuretic peptide (BNP): 1380 pg/mL — ABNORMAL HIGH (ref 0–125)

## 2012-07-09 LAB — PHOSPHORUS: Phosphorus: 4.7 mg/dL — ABNORMAL HIGH (ref 2.3–4.6)

## 2012-07-09 MED ORDER — ALBUTEROL (5 MG/ML) CONTINUOUS INHALATION SOLN
10.0000 mg/h | INHALATION_SOLUTION | Freq: Once | RESPIRATORY_TRACT | Status: AC
  Administered 2012-07-09: 14:00:00 via RESPIRATORY_TRACT

## 2012-07-09 MED ORDER — HEPARIN SODIUM (PORCINE) 5000 UNIT/ML IJ SOLN
5000.0000 [IU] | Freq: Three times a day (TID) | INTRAMUSCULAR | Status: DC
  Administered 2012-07-09 – 2012-07-12 (×8): 5000 [IU] via SUBCUTANEOUS
  Filled 2012-07-09 (×11): qty 1

## 2012-07-09 MED ORDER — FENTANYL BOLUS VIA INFUSION
50.0000 ug | Freq: Four times a day (QID) | INTRAVENOUS | Status: DC | PRN
  Administered 2012-07-10: 50 ug via INTRAVENOUS
  Filled 2012-07-09: qty 100

## 2012-07-09 MED ORDER — LORAZEPAM 2 MG/ML IJ SOLN
2.0000 mg | Freq: Once | INTRAMUSCULAR | Status: AC
  Administered 2012-07-09: 2 mg via INTRAVENOUS

## 2012-07-09 MED ORDER — ETOMIDATE 2 MG/ML IV SOLN
20.0000 mg | Freq: Once | INTRAVENOUS | Status: AC
  Administered 2012-07-09: 20 mg via INTRAVENOUS

## 2012-07-09 MED ORDER — SODIUM BICARBONATE 8.4 % IV SOLN
50.0000 meq | Freq: Once | INTRAVENOUS | Status: AC
  Administered 2012-07-09: 50 meq via INTRAVENOUS

## 2012-07-09 MED ORDER — SODIUM CHLORIDE 0.9 % IV SOLN
2.0000 mg/h | INTRAVENOUS | Status: DC
  Administered 2012-07-10: 3 mg/h via INTRAVENOUS
  Filled 2012-07-09: qty 10

## 2012-07-09 MED ORDER — PROPOFOL 10 MG/ML IV EMUL: 5.0000 ug/kg/min | INTRAVENOUS | Status: DC

## 2012-07-09 MED ORDER — MIDAZOLAM HCL 2 MG/2ML IJ SOLN
4.0000 mg | Freq: Once | INTRAMUSCULAR | Status: AC
  Administered 2012-07-09: 4 mg via INTRAVENOUS

## 2012-07-09 MED ORDER — FENTANYL CITRATE 0.05 MG/ML IJ SOLN: 100.0000 ug | INTRAMUSCULAR | Status: DC | PRN

## 2012-07-09 MED ORDER — ALBUTEROL SULFATE HFA 108 (90 BASE) MCG/ACT IN AERS
6.0000 | INHALATION_SPRAY | RESPIRATORY_TRACT | Status: DC | PRN
  Filled 2012-07-09: qty 6.7

## 2012-07-09 MED ORDER — PANTOPRAZOLE SODIUM 40 MG IV SOLR
40.0000 mg | INTRAVENOUS | Status: DC
  Administered 2012-07-10: 40 mg via INTRAVENOUS
  Filled 2012-07-09 (×2): qty 40

## 2012-07-09 MED ORDER — SODIUM CHLORIDE 0.9 % IV SOLN
20.0000 ug/h | INTRAVENOUS | Status: DC
  Administered 2012-07-09: 20 ug/h via INTRAVENOUS
  Filled 2012-07-09: qty 50

## 2012-07-09 MED ORDER — MIDAZOLAM BOLUS VIA INFUSION
1.0000 mg | INTRAVENOUS | Status: DC | PRN
  Administered 2012-07-09 – 2012-07-10 (×3): 2 mg via INTRAVENOUS
  Filled 2012-07-09: qty 2

## 2012-07-09 MED ORDER — VANCOMYCIN HCL IN DEXTROSE 1-5 GM/200ML-% IV SOLN: 1000.0000 mg | Freq: Once | INTRAVENOUS | Status: DC

## 2012-07-09 MED ORDER — MIDAZOLAM HCL 2 MG/2ML IJ SOLN
INTRAMUSCULAR | Status: AC
  Filled 2012-07-09: qty 4

## 2012-07-09 MED ORDER — SODIUM CHLORIDE 0.9 % IV SOLN
1000.0000 mL | Freq: Once | INTRAVENOUS | Status: AC
  Administered 2012-07-09: 1000 mL via INTRAVENOUS

## 2012-07-09 MED ORDER — IPRATROPIUM BROMIDE 0.02 % IN SOLN
RESPIRATORY_TRACT | Status: AC
  Filled 2012-07-09: qty 2.5

## 2012-07-09 MED ORDER — DOXYCYCLINE HYCLATE 100 MG IV SOLR
100.0000 mg | Freq: Once | INTRAVENOUS | Status: AC
  Administered 2012-07-09: 100 mg via INTRAVENOUS
  Filled 2012-07-09: qty 100

## 2012-07-09 MED ORDER — BIOTENE DRY MOUTH MT LIQD
15.0000 mL | Freq: Four times a day (QID) | OROMUCOSAL | Status: DC
  Administered 2012-07-10 – 2012-07-11 (×7): 15 mL via OROMUCOSAL

## 2012-07-09 MED ORDER — ALBUTEROL SULFATE (5 MG/ML) 0.5% IN NEBU
INHALATION_SOLUTION | RESPIRATORY_TRACT | Status: AC
  Filled 2012-07-09: qty 2

## 2012-07-09 MED ORDER — SODIUM CHLORIDE 0.9 % IV SOLN
50.0000 ug/h | INTRAVENOUS | Status: DC
  Filled 2012-07-09: qty 50

## 2012-07-09 MED ORDER — METHYLPREDNISOLONE SODIUM SUCC 125 MG IJ SOLR
60.0000 mg | Freq: Four times a day (QID) | INTRAMUSCULAR | Status: DC
  Administered 2012-07-09 – 2012-07-11 (×6): 60 mg via INTRAVENOUS
  Filled 2012-07-09 (×11): qty 0.96

## 2012-07-09 MED ORDER — CHLORHEXIDINE GLUCONATE 0.12 % MT SOLN
15.0000 mL | Freq: Two times a day (BID) | OROMUCOSAL | Status: DC
  Administered 2012-07-09 – 2012-07-10 (×2): 15 mL via OROMUCOSAL
  Filled 2012-07-09 (×6): qty 15

## 2012-07-09 MED ORDER — IPRATROPIUM-ALBUTEROL 18-103 MCG/ACT IN AERO
6.0000 | INHALATION_SPRAY | RESPIRATORY_TRACT | Status: DC
  Administered 2012-07-09 – 2012-07-10 (×5): 6 via RESPIRATORY_TRACT
  Filled 2012-07-09 (×2): qty 14.7

## 2012-07-09 MED ORDER — SUCCINYLCHOLINE CHLORIDE 20 MG/ML IJ SOLN
100.0000 mg | Freq: Once | INTRAMUSCULAR | Status: AC
  Administered 2012-07-09: 100 mg via INTRAVENOUS

## 2012-07-09 MED ORDER — METHYLPREDNISOLONE SODIUM SUCC 125 MG IJ SOLR
INTRAMUSCULAR | Status: AC
  Filled 2012-07-09: qty 2

## 2012-07-09 MED ORDER — PIPERACILLIN-TAZOBACTAM 3.375 G IVPB 30 MIN
3.3750 g | Freq: Three times a day (TID) | INTRAVENOUS | Status: DC
  Administered 2012-07-09 – 2012-07-11 (×5): 3.375 g via INTRAVENOUS
  Filled 2012-07-09 (×6): qty 50

## 2012-07-09 MED ORDER — MAGNESIUM SULFATE 40 MG/ML IJ SOLN
2.0000 g | Freq: Once | INTRAMUSCULAR | Status: AC
  Administered 2012-07-09: 2 g via INTRAVENOUS
  Filled 2012-07-09: qty 50

## 2012-07-09 MED ORDER — SODIUM CHLORIDE 0.9 % IV SOLN
2.0000 mg/h | INTRAVENOUS | Status: DC
  Administered 2012-07-09: 2 mg/h via INTRAVENOUS
  Filled 2012-07-09: qty 10

## 2012-07-09 NOTE — ED Notes (Signed)
Patient was brought in by EMS with respiratory distress. Patient has a history of lung cancer. Patient was in severe distress upon their arrival. Patient was given a total of Atrovent 0.5mg  neb, Albuterol 10mg  Neb, Solumedrol 125mg . She had improvement with Sats at 89%. ST on the monitor at 170.

## 2012-07-09 NOTE — ED Notes (Signed)
Pt moving all extremities and breathing over ventilator settings, HR remains elevated. Versed drip increased with bolus to be given.

## 2012-07-09 NOTE — H&P (Signed)
Name: Karina Weber MRN: [id number] DOB: 06-Jan-1956    LOS: 0  Referring Provider:  EDP  Reason for Referral:  Acute Resp Failure   PULMONARY / CRITICAL CARE MEDICINE  HPI:  56 y/o F, smoker, with PMH of HTN, ETOH abuse, COPD, RUL non-small cell CA s/p radiation 2011 (unclear hx in EPIC), with recent admit 9/2-9//5 for acute on chronic resp fx, R heart failure and was scheduled to follow up in the pulmonary office on 10/4 but was a no show.  Presented to the [facility] ED 10/7 via EMS with complaints progressive shortness of breath and cough.  EMS reported on scene that she was hypoxic with sats in 50's, distressed and combative. In ER was placed on BiPap but became more somnolent and required intubation. Abg demonstrated hypercarbic respiratory failure.   PCCM called for ICU admit.     Past Medical History  Diagnosis Date  . Tobacco abuse   . COPD (chronic obstructive pulmonary disease)     exacerbagtion in 6/12 and in 2010.   Marland Kitchen Hypertension   . History of radiation therapy 07/19/10 to 07/28/10    RUL lung  . 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 Alcohol abuse     2 beers a day  . Asthma    Past Surgical History  Procedure Date  . No past surgeries    Prior to Admission medications   Medication Sig Start Date End Date Taking? Authorizing Provider  albuterol (VENTOLIN HFA) 108 (90 BASE) MCG/ACT inhaler Inhale 1 puff into the lungs every 4 (four) hours as needed for wheezing. 05/03/12   Leodis Sias, MD  aspirin 81 MG tablet Take 1 tablet (81 mg total) by mouth daily. 06/07/12   Dow Adolph, MD  bisoprolol (ZEBETA) 5 MG tablet Take 1 tablet (5 mg total) by mouth daily. 06/07/12 06/07/13  Dow Adolph, MD  Fluticasone-Salmeterol (ADVAIR) 250-50 MCG/DOSE AEPB Inhale 1 puff into the lungs every 12 (twelve) hours. 05/03/12   Leodis Sias, MD  mirtazapine (REMERON) 15 MG tablet Take 1 tablet (15 mg total) by mouth at bedtime. 05/03/12   Leodis Sias, MD  predniSONE  (DELTASONE) 10 MG tablet Take 1 tablet (10 mg total) by mouth daily with breakfast. 06/07/12   Dow Adolph, MD  tiotropium (SPIRIVA HANDIHALER) 18 MCG inhalation capsule Place 1 capsule (18 mcg total) into inhaler and inhale daily. 06/07/12 06/07/13  Dow Adolph, MD   Allergies Allergies  Allergen Reactions  . Lisinopril Cough    Family History Family History  Problem Relation Age of Onset  . Heart disease Neg Hx   . Cancer Mother     unknown type   Social History  reports that she has been smoking Cigarettes.  She has a 8 pack-year smoking history. She has never used smokeless tobacco. She reports that she does not drink alcohol or use illicit drugs.  Review Of Systems:  Unable to complete as pt is on vent.    Events Since Admission: 10/7 - Admit with AECOPD, hypercarbic resp failure, intubated in ED   Vital Signs: Temp:  [98.8 F (37.1 C)] 98.8 F (37.1 C) (10/07 1407) Pulse Rate:  [105-143] 107  (10/07 1630) Resp:  [14-36] 14  (10/07 1630) BP: (104-205)/(60-139) 104/60 mmHg (10/07 1630) SpO2:  [94 %-100 %] 100 % (10/07 1630) FiO2 (%):  [60 %-80 %] 80 % (10/07 1630) Weight:  [168 lb 3.4 oz (76.3 kg)] 168 lb 3.4 oz (76.3 kg) (10/07 1630)  Physical Examination: General:  Chronically ill in NAD Neuro:  sedate HEENT:  Mm pink/moist, OETT Cardiovascular:  s1s2 rrr,no m/r/g Lungs:  resp's even/non-labored on vent, lungs bilaterally with few sibilant wheezes Abdomen:  Round/soft, bsx4 active Musculoskeletal:  No acute deformities Skin:  Warm/dry, no edema, scattered lower extremity scars  Active Problems:  Acute respiratory failure  Hypoxemia  COPD (chronic obstructive pulmonary disease)  Bronchitis   ASSESSMENT AND PLAN  PULMONARY  Lab 07/09/12 1434  PHART 7.087*  PCO2ART 124.3*  PO2ART 107.0*  HCO3 37.4*  O2SAT 94.0   Ventilator Settings: Vent Mode:  [-] PRVC FiO2 (%):  [60 %-80 %] 80 % Set Rate:  [14 bmp] 14 bmp Vt Set:  [500 mL] 500 mL PEEP:  [5  cmH20] 5 cmH20 CXR:  10/7 - Interval intubation and placement of NG tube.  The support apparatus is in satisfactory position without  radiographic evidence for complication.  Stable pulmonary vascular congestion.   ETT:  10/7>>>  A:   Acute on Chronic Respiratory Failure Hypercarbia / Hypoxia RUL Non-small cell CA s/p radiation COPD  P:   -full vent support  -f/u ABG now and in am -f/u cxr in am -wean O2 to keep sats > 90% -versed / fent for sedation  -IV steroids  CARDIOVASCULAR No results found for this basename: TROPONINI:5,LATICACIDVEN:5, O2SATVEN:5,PROBNP:5 in the last 168 hours ECG:  ST Lines:   A:  Sinus Tachycardia   P:  -tele monitoring  RENAL  Lab 07/09/12 1425  NA 138  K 4.3  CL 98  CO2 29  BUN 16  CREATININE 0.57  CALCIUM 9.9  MG 1.7  PHOS --   Intake/Output    None    Foley:  10/7>>>  A:   No acute issues  P:   -monitor UOP -follow BMP  GASTROINTESTINAL  Lab 07/09/12 1425  AST 12  ALT 8  ALKPHOS 147*  BILITOT 0.2*  PROT 8.0  ALBUMIN 3.6    A:   Mildly elevated AlkPhos -? If bone mets.  ? Patients follow up and self care.    P:   -monitor  HEMATOLOGIC  Lab 07/09/12 1425  HGB 12.2  HCT 41.7  PLT 320  INR --  APTT --   A:   No acute issues.  No evidence of bleeding.   P:  -monitor cbc, trend H/H  INFECTIOUS  Lab 07/09/12 1425  WBC 11.9*  PROCALCITON --   Cultures: 10/7 Sputum>>> 10/7 BCx2>>>  Antibiotics: Vanco 10/7>>> Zosyn 10/7>>>  A:   AECOPD - recent admit to hospital for same.  Will cover broadly.    P:   -abx as above -check PCT  ENDOCRINE No results found for this basename: GLUCAP:5 in the last 168 hours A:   No acute issues  P:   -CBG's Q6 while NPO  NEUROLOGIC  A:   Encephalopathy - in setting of hypercarbic / hypoxic respiratory failure  P:   -monitor, hopeful to resolve with correction of respiratory issues.  -if not improved as ABG improved, consider CT head    BEST  PRACTICE / DISPOSITION Level of Care:  ICU Primary Service:  PCCM Consultants:   Code Status:  Full Diet:  NPO DVT Px:  Heparin  GI Px:  Protonix Skin Integrity:  Intact Social / Family:  None available   Canary Brim, NP-C [facility] Pgr: [phone] or [phone]   07/09/2012, 4:55 PM  CC time 55 min.  Patient seen and examined,  agree with above note.  I dictated the care and orders written for this patient under my direction.  [name], M.D. [phone]

## 2012-07-09 NOTE — ED Provider Notes (Addendum)
History     CSN: [id number]  Arrival date & time 07/09/12  1401   First MD Initiated Contact with Patient 07/09/12 1417      Chief Complaint  Patient presents with  . Respiratory Distress    (Consider location/radiation/quality/duration/timing/severity/associated sxs/prior treatment) HPI Comments: Pt with hx of lung cancer, asthma/copd, who is a smoker comes in with cc of dib. EMS reports than patient having very poor air entry, and in in respiratory distress at their arrival, combative, and hypoxic at 50% on room air. They were able to start on her some breathing treatments, and her O2 sats responded. She also got solumedrol.  She arrived to ED with continuos nebs going and O2 sats that is WNL. She is in respiratory distress, with tachypnea, inability complete full sentences, and with accessory muscleuse and retractions. Reports no chest pain and + cough. Pt has no n/v/f/c.   The history is provided by the patient and the EMS personnel.    Past Medical History  Diagnosis Date  . Tobacco abuse   . COPD (chronic obstructive pulmonary disease)     exacerbagtion in 6/12 and in 2010.   Marland Kitchen Hypertension   . History of radiation therapy 07/19/10 to 07/28/10    RUL lung  . 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 Alcohol abuse     2 beers a day  . Asthma     Past Surgical History  Procedure Date  . No past surgeries     Family History  Problem Relation Age of Onset  . Heart disease Neg Hx   . Cancer Mother     unknown type    History  Substance Use Topics  . Smoking status: Current Some Day Smoker -- 0.2 packs/day for 40 years    Types: Cigarettes  . Smokeless tobacco: Never Used   Comment: Cut back from 2 ppd when diagnosed with Lung cancer.12/02/11 1 cig/day  . Alcohol Use: No     Former alcoholic    OB History    Grav Para Term Preterm Abortions TAB SAB Ect Mult Living                  Review of Systems  Unable to perform ROS: Unstable vital signs    Constitutional: Positive for activity change.  Respiratory: Positive for shortness of breath and wheezing.   Cardiovascular: Negative for chest pain, palpitations and leg swelling.  Gastrointestinal: Negative for nausea, vomiting and abdominal pain.  Neurological: Negative for headaches.    Allergies  Lisinopril  Home Medications   Current Outpatient Rx  Name Route Sig Dispense Refill  . ALBUTEROL SULFATE HFA 108 (90 BASE) MCG/ACT IN AERS Inhalation Inhale 1 puff into the lungs every 4 (four) hours as needed for wheezing. 18 g 6  . ASPIRIN 81 MG PO TABS Oral Take 1 tablet (81 mg total) by mouth daily. 31 tablet 1  . BISOPROLOL FUMARATE 5 MG PO TABS Oral Take 1 tablet (5 mg total) by mouth daily. 30 tablet 0  . FLUTICASONE-SALMETEROL 250-50 MCG/DOSE IN AEPB Inhalation Inhale 1 puff into the lungs every 12 (twelve) hours. 60 each 11  . MIRTAZAPINE 15 MG PO TABS Oral Take 1 tablet (15 mg total) by mouth at bedtime. 30 tablet 6  . PREDNISONE 10 MG PO TABS Oral Take 1 tablet (10 mg total) by mouth daily with breakfast. 3 tablet 0  . TIOTROPIUM BROMIDE MONOHYDRATE 18 MCG IN CAPS Inhalation Place 1  capsule (18 mcg total) into inhaler and inhale daily. 30 capsule 1    BP 186/101  Pulse 143  Temp 98.8 F (37.1 C) (Oral)  Resp 36  SpO2 94%  Physical Exam  Nursing note and vitals reviewed. Constitutional: She is oriented to person, place, and time. She appears well-developed and well-nourished.  HENT:  Head: Normocephalic and atraumatic.  Eyes: EOM are normal. Pupils are equal, round, and reactive to light.  Neck: Neck supple. No JVD present.  Cardiovascular: Regular rhythm and normal heart sounds.        tachycardia  Pulmonary/Chest: Effort normal. No respiratory distress.       Respiratory distress as mentioned in HPI. Very poor air entry, with mild wheezing  Abdominal: Soft. She exhibits no distension. There is no tenderness. There is no rebound and no guarding.  Neurological:  She is alert and oriented to person, place, and time.  Skin: Skin is warm and dry.    ED Course  Procedures (including critical care time)  Labs Reviewed  CBC WITH DIFFERENTIAL - Abnormal; Notable for the following:    WBC 11.9 (*)     MCH 25.9 (*)     MCHC 29.3 (*)     RDW 22.0 (*)     Neutro Abs 8.1 (*)     Monocytes Relative 13 (*)     Monocytes Absolute 1.6 (*)     All other components within normal limits  COMPREHENSIVE METABOLIC PANEL - Abnormal; Notable for the following:    Glucose, Bld 189 (*)     Alkaline Phosphatase 147 (*)     Total Bilirubin 0.2 (*)     All other components within normal limits  POCT I-STAT 3, BLOOD GAS (G3+) - Abnormal; Notable for the following:    pH, Arterial 7.087 (*)     pCO2 arterial 124.3 (*)     pO2, Arterial 107.0 (*)     Bicarbonate 37.4 (*)     Acid-Base Excess 3.0 (*)     All other components within normal limits  MAGNESIUM  POCT I-STAT TROPONIN I  BLOOD GAS, ARTERIAL  URINALYSIS, MICROSCOPIC ONLY   Dg Chest Portable 1 View  07/09/2012  *RADIOLOGY REPORT*  Clinical Data: Respiratory distress.  PORTABLE CHEST - 1 VIEW  Comparison: 06/04/2012  Findings: Mild cardiomegaly.  Mediastinal and hilar contours are normal.  Slight interstitial thickening likely due to congestion. No focal airspace abnormalities.  No effusions.  No bony abnormalities.  IMPRESSION: Cardiomegaly and stable chronic vascular congestion without focal abnormalities.   Original Report Authenticated By: Mervin Hack, M.D.      No diagnosis found.    MDM   Date: 07/09/2012  Rate: 133  Rhythm: sinus tachycardia  QRS Axis: normal  Intervals: normal  ST/T Wave abnormalities: nonspecific ST/T changes  Conduction Disutrbances:none  Narrative Interpretation:   Old EKG Reviewed: changes noted  Differential diagnosis includes: ACS syndrome CHF exacerbation Valvular disorder Myocarditis Pericarditis Pericardial effusion Pneumonia Pleural  effusion Pulmonary edema PE   Pt comes in with respiratory distress - and frankly faulire as her O2 sats are less than 90 % on room air. She was initiated on bipap - but she became combative and took it off.  ABG results showed pCO2 > 100, so she also has hypercapneic respiratory failure. Initial impression was that she is in COPD exacerbation, and the ddx included PE, pericardial and pleural effusion and pulmonary edema, Pneumonia - but based on the exam findings and PCO2 - she  appears to be in acute respiratory failure due to COPD exacerbation.  Pt became more somnolent, despite bipap, and was intubated.   Derwood Kaplan, MD 07/09/12 1559  CRITICAL CARE Performed by: Derwood Kaplan   Total critical care time: 65 minutes  Critical care time was exclusive of separately billable procedures and treating other patients.  Critical care was necessary to treat or prevent imminent or life-threatening deterioration.  Critical care was time spent personally by me on the following activities: development of treatment plan with patient and/or surrogate as well as nursing, discussions with consultants, evaluation of patient's response to treatment, examination of patient, obtaining history from patient or surrogate, ordering and performing treatments and interventions, ordering and review of laboratory studies, ordering and review of radiographic studies, pulse oximetry and re-evaluation of patient's condition.   INTUBATION Performed by: Derwood Kaplan  Required items: required blood products, implants, devices, and special equipment available Patient identity confirmed: provided demographic data and hospital-assigned identification number Time out: Immediately prior to procedure a "time out" was called to verify the correct patient, procedure, equipment, support staff and site/side marked as required.  Indications: Respiratory failure  Intubation method: Glidescope Laryngoscopy    Preoxygenation: BVM  Sedatives: Etomidate - 20 mg Paralytic: Succinylcholine - 100 mg  Tube Size: 7.5 cuffed  Post-procedure assessment: chest rise and ETCO2 monitor Breath sounds: equal and absent over the epigastrium Tube secured with: ETT holder   Chest x-ray findings: PENDING  Patient tolerated the procedure well with no immediate complications.    Derwood Kaplan, MD 07/09/12 [phone]

## 2012-07-09 NOTE — ED Notes (Signed)
Patient became very combative when respiratory tried to apply BiPap. Attempt was made several times with our assistance. Patient refused and continued to be combative. Spoke with EDP and received orders for Ativan. Patient was given Ativan and became cooperative. BiPap was then applied. Patient's respiratory status continued to decline and she was then intubated.

## 2012-07-09 NOTE — Consult Note (Signed)
ANTIBIOTIC CONSULT NOTE - INITIAL  Pharmacy Consult for Vancomycin and Zosyn Indication: rule out pneumonia  Allergies  Allergen Reactions  . Lisinopril Cough    Patient Measurements: Height: 5' 4.17" (163 cm) Weight: 168 lb 3.4 oz (76.3 kg) IBW/kg (Calculated) : 55.1   Vital Signs: Temp: 98.8 F (37.1 C) (10/07 1407) Temp src: Oral (10/07 1407) BP: 104/60 mmHg (10/07 1630) Pulse Rate: 107  (10/07 1630) Intake/Output from previous day:   Intake/Output from this shift:    Labs:  Basename 07/09/12 1425  WBC 11.9*  HGB 12.2  PLT 320  LABCREA --  CREATININE 0.57   Estimated Creatinine Clearance: 79.8 ml/min (by C-G formula based on Cr of 0.57).  Microbiology: No results found for this or any previous visit (from the past 720 hour(s)).  Medical History: Past Medical History  Diagnosis Date  . Tobacco abuse   . COPD (chronic obstructive pulmonary disease)     exacerbagtion in 6/12 and in 2010.   Marland Kitchen Hypertension   . History of radiation therapy 07/19/10 to 07/28/10    RUL lung  . 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 Alcohol abuse     2 beers a day  . Asthma    Assessment: 56yof with history of lung CA, asthma, and COPD presents to ED in respiratory distress and subsequently intubated. She will begin empiric vancomycin and zosyn for possible pneumonia vs COPD exacerbation. Renal function is wnl.  Goal of Therapy:  Vancomycin trough level 15-20 mcg/ml  Plan:  1) Vancomycin 1g IV q12 2) Zosyn 3.375g IV q8 (4h infusion) 3) Follow renal function, cultures, levels as indicated  Fredrik Rigger 07/09/2012,4:53 PM

## 2012-07-09 NOTE — Progress Notes (Signed)
Removed two yellow/bronze colored bracelets, one with clear stones, from pt. Also removed yellow stretchy bracelet with ray kroc quote. Removed purple hair tie from pt's arm also. Placed in biohazard bag with pt name. No family at bedside at this time.

## 2012-07-10 ENCOUNTER — Inpatient Hospital Stay (HOSPITAL_COMMUNITY): Payer: Medicare Other

## 2012-07-10 DIAGNOSIS — J9600 Acute respiratory failure, unspecified whether with hypoxia or hypercapnia: Secondary | ICD-10-CM

## 2012-07-10 DIAGNOSIS — J441 Chronic obstructive pulmonary disease with (acute) exacerbation: Secondary | ICD-10-CM

## 2012-07-10 LAB — URINE DRUGS OF ABUSE SCREEN W ALC, ROUTINE (REF LAB)
Barbiturate Quant, Ur: NEGATIVE
Benzodiazepines.: NEGATIVE
Creatinine,U: 88.2 mg/dL
Marijuana Metabolite: NEGATIVE
Methadone: NEGATIVE
Phencyclidine (PCP): NEGATIVE

## 2012-07-10 LAB — PHOSPHORUS: Phosphorus: 2.5 mg/dL (ref 2.3–4.6)

## 2012-07-10 LAB — BASIC METABOLIC PANEL
BUN: 11 mg/dL (ref 6–23)
Chloride: 102 mEq/L (ref 96–112)
GFR calc Af Amer: >90 mL/min (ref >90)
GFR calc non Af Amer: >90 mL/min (ref >90)
Potassium: 3.9 mEq/L (ref 3.5–5.1)
Sodium: 141 mEq/L (ref 135–145)

## 2012-07-10 LAB — GLUCOSE, CAPILLARY

## 2012-07-10 LAB — CBC
HCT: 33.7 % — ABNORMAL LOW (ref 36.0–46.0)
RDW: 22.1 % — ABNORMAL HIGH (ref 11.5–15.5)
WBC: 9.3 10*3/uL (ref 4.0–10.5)

## 2012-07-10 LAB — MAGNESIUM: Magnesium: 1.8 mg/dL (ref 1.5–2.5)

## 2012-07-10 LAB — CORTISOL: Cortisol, Plasma: 40.5 ug/dL

## 2012-07-10 MED ORDER — SODIUM CHLORIDE 0.9 % IV SOLN
INTRAVENOUS | Status: DC
  Administered 2012-07-10: 20 mL/h via INTRAVENOUS

## 2012-07-10 MED ORDER — IPRATROPIUM-ALBUTEROL 18-103 MCG/ACT IN AERO
2.0000 | INHALATION_SPRAY | Freq: Four times a day (QID) | RESPIRATORY_TRACT | Status: DC
  Administered 2012-07-10 – 2012-07-12 (×7): 2 via RESPIRATORY_TRACT
  Filled 2012-07-10: qty 14.7

## 2012-07-10 MED ORDER — IPRATROPIUM-ALBUTEROL 18-103 MCG/ACT IN AERO: 6.0000 | INHALATION_SPRAY | Freq: Four times a day (QID) | RESPIRATORY_TRACT | Status: DC

## 2012-07-10 MED ORDER — ALBUTEROL SULFATE HFA 108 (90 BASE) MCG/ACT IN AERS
2.0000 | INHALATION_SPRAY | RESPIRATORY_TRACT | Status: DC | PRN
  Filled 2012-07-10: qty 6.7

## 2012-07-10 MED FILL — Sodium Bicarbonate IV Soln 8.4%: INTRAVENOUS | Qty: 50 | Status: AC

## 2012-07-10 NOTE — Progress Notes (Signed)
Name: Karina Weber MRN: [id number] DOB: [birth year]    LOS: 1  Referring Provider:  EDP  Reason for Referral:  Acute Resp Failure   PULMONARY / CRITICAL CARE MEDICINE  Brief patient description:  56 y/o F, smoker, with PMH of HTN, ETOH abuse, COPD, RUL non-small cell CA s/p radiation 2011 (unclear hx in EPIC), with recent admit 9/2-9//5 for acute on chronic resp fx andR heart failure who failed to follow up, was admitted for hypercarbic respiratory failure.  Events Since Admission: 10/7 - Admit with AECOPD, hypercarbic resp failure, intubated in ED  Subjective: Tolerating PSV, episode desat this am  Vital Signs: Temp:  [98.4 F (36.9 C)-100.6 F (38.1 C)] 98.5 F (36.9 C) (10/08 0816) Pulse Rate:  [78-143] 78  (10/08 0803) Resp:  [14-36] 16  (10/08 0803) BP: (104-205)/(60-139) 155/128 mmHg (10/08 0803) SpO2:  [91 %-100 %] 96 % (10/08 0803) FiO2 (%):  [30 %-80 %] 30 % (10/08 0803) Weight:  [100 lb 12 oz (45.7 kg)-168 lb 3.4 oz (76.3 kg)] 102 lb 4.7 oz (46.4 kg) (10/08 0000)  Physical Examination: General:  Intubated.  Neuro: Opens eyes, follows commands HEENT: OETT Cardiovascular:  RRR m/r/g Lungs: Rhonchi throughout Abdomen: +BS, soft Musculoskeletal:  No acute deformities Skin:  Warm/dry, no edema, scattered lower extremity scars  Active Problems:  Acute respiratory failure  Hypoxemia  COPD (chronic obstructive pulmonary disease)  Bronchitis   ASSESSMENT AND PLAN  PULMONARY  Lab 07/09/12 1737 07/09/12 1434  PHART 7.259* 7.087*  PCO2ART 73.4* 124.3*  PO2ART 254.0* 107.0*  HCO3 32.9* 37.4*  O2SAT 100.0 94.0   Ventilator Settings: Vent Mode:  [-] CPAP FiO2 (%):  [30 %-80 %] 30 % Set Rate:  [14 bmp-16 bmp] 16 bmp Vt Set:  [500 mL] 500 mL PEEP:  [5 cmH20] 5 cmH20 Pressure Support:  [10 cmH20] 10 cmH20 Plateau Pressure:  [10 cmH20-18 cmH20] 10 cmH20  CXR:  10/8 - ETT 2.6cm above carina. Stable pulmonary vascular congestion.  ETT:  10/7>>>  A:     Acute on Chronic Respiratory Failure Hypercarbia / Hypoxia RUL Non-small cell CA s/p radiation COPD  P:   - Continue vent support. Attempted to wean this AM unsuccessfully. D/C sedation now, SBT as tolerated - Repeat PCXR in AM - IV steroids while NPO - Combivent q6 hours scheduled, with Albuterol prn - ? Whether she has had a1-AT testing >> will confirm  CARDIOVASCULAR  Lab 07/09/12 1750 07/09/12 1708  TROPONINI -- --  LATICACIDVEN -- 0.9  PROBNP 1380.0* --   ECG:  NSR on monitor Lines: Peripheral lines  A:  Sinus Tachycardia at admission. History of right heart failure P:  - Tele monitoring - TTE today - BP elevated  RENAL  Lab 07/10/12 0410 07/09/12 1750 07/09/12 1425  NA 141 141 138  K 3.9 4.3 --  CL 102 100 98  CO2 30 28 29   BUN 11 16 16   CREATININE 0.46* 0.58 0.57  CALCIUM 9.3 10.2 9.9  MG 1.8 1.8 1.7  PHOS 2.5 4.7* --   Intake/Output      10/07 0701 - 10/08 0700 10/08 0701 - 10/09 0700   I.V. (mL/kg) 240.6 (5.2)    Other 260    NG/GT 90    IV Piggyback 100    Total Intake(mL/kg) 690.6 (14.9)    Urine (mL/kg/hr) 1000 (0.9)    Total Output 1000    Net -309.4          Foley:  10/7>>>  A:   No acute issues  P:   - Con't to monitor UOP - Follow daily BMP  GASTROINTESTINAL  Lab 07/09/12 1750 07/09/12 1425  AST 14 12  ALT 8 8  ALKPHOS 145* 147*  BILITOT 0.2* 0.2*  PROT 8.2 8.0  ALBUMIN 3.6 3.6    A:   Mildly elevated AlkPhos -? If bone mets. ? Patients follow up and self care.  Elevated in past as well (as far back as 2008) LFT normal  P:   - Monitor with AM labs  HEMATOLOGIC  Lab 07/10/12 0410 07/09/12 1750 07/09/12 1425  HGB 10.0* 10.4* 12.2  HCT 33.7* 36.1 41.7  PLT 294 266 320  INR -- 1.00 --  APTT -- 38* --   A:   No acute issues.  No evidence of bleeding.   P:  - Monitor cbc  INFECTIOUS  Lab 07/10/12 0410 07/09/12 1750 07/09/12 1746 07/09/12 1425  WBC 9.3 10.3 -- 11.9*  PROCALCITON 1.16 -- 0.25 --    Cultures: 10/7 Sputum>>> 10/7 BCx2>>>  Antibiotics: Vanco 10/7>>> Zosyn 10/7>>>  A:   AECOPD - recent admit to hospital for same.  Will cover broadly.    P:   - Broad Abx as above, consider narrowing to levaquin to cover in COPD exac - PCT 1.16 with normal white count - Monitor for fevers, and daily CXR  ENDOCRINE  Lab 07/10/12 0636 07/09/12 2356  GLUCAP 138* 150*   A:   No acute issues  P:   -CBG's Q6 while NPO and on steroids  NEUROLOGIC  A:   Encephalopathy - in setting of hypercarbic / hypoxic respiratory failure  P:   - Overall improved mental status.   BEST PRACTICE / DISPOSITION Level of Care:  ICU Primary Service:  PCCM Consultants:   Code Status:  Full Diet:  NPO DVT Px:  Heparin  GI Px:  Protonix Skin Integrity:  Intact Social / Family:  None available   Hospital doctor M. Hairford, M.D. 07/10/2012 8:32 AM   CC time 30 minutes  Levy Pupa, MD, PhD 07/10/2012, 1:52 PM [facility] [phone] or if no answer [phone]

## 2012-07-10 NOTE — Progress Notes (Signed)
Echocardiogram 2D Echocardiogram has been performed.  Karina Weber 07/10/2012, 2:45 PM

## 2012-07-10 NOTE — Care Management Note (Addendum)
    Page 1 of 2   07/12/2012     2:42:59 PM   CARE MANAGEMENT NOTE 07/12/2012  Patient:  Karina Weber, Karina Weber   Account Number:  [id number]  Date Initiated:  07/10/2012  Documentation initiated by:  [facility]  Subjective/Objective Assessment:   Resp failure - vent     Action/Plan:   Anticipated DC Date:  07/17/2012   Anticipated DC Plan:  HOME W HOME HEALTH SERVICES      DC Planning Services  CM consult      Choice offered to / List presented to:          [facility] arranged  HH-1 RN  HH-10 DISEASE MANAGEMENT  HH-6 SOCIAL WORKER      [facility] agency  [facility]   Status of service:  Completed, signed off Medicare Important Message given?   (If response is "NO", the following Medicare IM given date fields will be blank) Date Medicare IM given:   Date Additional Medicare IM given:    Discharge Disposition:  HOME W HOME HEALTH SERVICES  Per UR Regulation:  Reviewed for med. necessity/level of care/duration of stay  If discussed at Long Length of Stay Meetings, dates discussed:    Comments:  Contact:    Adele Barthel [phone]                   [place],Lamont Son 336 [phone]                    Sheron Nightingale [phone]  07-12-12 [address], [place] [phone] CM  spoke to pt and she is still at home with a friend. Pt is active with [facility] for services RN/ SW. Cm made referral for services. MD to write order for services. CM also made referral to [facility]. Pt has 02 from [facility] and her son will bring tank with him when he picks pt up. CM made calls to [facility] and medications will be delivered to home. RN spoke to liaison for [facility] to make sure medications are beign monitored. CM spoke to MD and pt has missed some appointments at the clinic due to transportation. CSW is aware of transportation needs. Will continue to monitor.  07-10-12 10:15pm Avie Arenas, RNBSN [phone] Staff has talked with ?? room mate - no name.  Not here  now states will come back later.  Son listed - first number listed is disconected, number still listed is no specific voice mail box which is full, therefore unable to leave message.  Staff alerted to let me know if patient has visitors.  Son in room - corrected and confirmed son's number. Son states mom lives with friend but cannot live there anymore and it is obvious she is not doing something right because she keeps coming back to hospital.  Patient awake, but "glaring at son and myself", obvious very angry.  Son states independent athome but questions whether she is taking meds correctly. Informed son that once patient gets tube out we will talk with her about what she would like. CM will continue to follow.

## 2012-07-10 NOTE — Procedures (Signed)
Extubation Procedure Note  Patient Details:   Name: Karina Weber DOB: 11-26-1955 MRN: [id number]   Airway Documentation:     Evaluation  O2 sats: stable throughout Complications: No apparent complications Patient did tolerate procedure well. Bilateral Breath Sounds: Expiratory wheezes Suctioning: Oral Yes, Pt whispering at this time. Placed on 6L [name] with humidity. SAT 95%  Ok Anis, MA 07/10/2012, 3:52 PM

## 2012-07-11 ENCOUNTER — Inpatient Hospital Stay (HOSPITAL_COMMUNITY): Payer: Medicare Other

## 2012-07-11 LAB — GLUCOSE, CAPILLARY
Glucose-Capillary: 107 mg/dL — ABNORMAL HIGH (ref 70–99)
Glucose-Capillary: 64 mg/dL — ABNORMAL LOW (ref 70–99)
Glucose-Capillary: 133 mg/dL — ABNORMAL HIGH (ref 70–99)
Glucose-Capillary: 110 mg/dL — ABNORMAL HIGH (ref 70–99)

## 2012-07-11 LAB — CBC
MCHC: 30.1 g/dL (ref 30.0–36.0)
Platelets: 340 10*3/uL (ref 150–400)
RDW: 22.4 % — ABNORMAL HIGH (ref 11.5–15.5)
WBC: 10 10*3/uL (ref 4.0–10.5)

## 2012-07-11 LAB — COMPREHENSIVE METABOLIC PANEL WITH GFR
ALT: 6 U/L (ref 0–35)
AST: 10 U/L (ref 0–37)
Albumin: 2.9 g/dL — ABNORMAL LOW (ref 3.5–5.2)
Alkaline Phosphatase: 100 U/L (ref 39–117)
BUN: 14 mg/dL (ref 6–23)
CO2: 32 meq/L (ref 19–32)
Calcium: 9.5 mg/dL (ref 8.4–10.5)
Chloride: 97 meq/L (ref 96–112)
Creatinine, Ser: 0.42 mg/dL — ABNORMAL LOW (ref 0.50–1.10)
GFR calc Af Amer: >90 mL/min
GFR calc non Af Amer: >90 mL/min
Glucose, Bld: 106 mg/dL — ABNORMAL HIGH (ref 70–99)
Potassium: 3.8 meq/L (ref 3.5–5.1)
Sodium: 139 meq/L (ref 135–145)
Total Bilirubin: 0.2 mg/dL — ABNORMAL LOW (ref 0.3–1.2)
Total Protein: 6.6 g/dL (ref 6.0–8.3)

## 2012-07-11 LAB — PROCALCITONIN: Procalcitonin: 0.75 ng/mL

## 2012-07-11 LAB — URINE CULTURE

## 2012-07-11 MED ORDER — PIPERACILLIN-TAZOBACTAM 3.375 G IVPB
3.3750 g | Freq: Three times a day (TID) | INTRAVENOUS | Status: DC
  Filled 2012-07-11 (×2): qty 50

## 2012-07-11 MED ORDER — ASPIRIN EC 81 MG PO TBEC
81.0000 mg | DELAYED_RELEASE_TABLET | Freq: Every day | ORAL | Status: DC
  Administered 2012-07-12: 81 mg via ORAL
  Filled 2012-07-11 (×2): qty 1

## 2012-07-11 MED ORDER — MIRTAZAPINE 15 MG PO TABS
15.0000 mg | ORAL_TABLET | Freq: Every day | ORAL | Status: DC
  Administered 2012-07-11: 15 mg via ORAL
  Filled 2012-07-11 (×3): qty 1

## 2012-07-11 MED ORDER — ASPIRIN 81 MG PO TABS: 81.0000 mg | ORAL_TABLET | Freq: Every day | ORAL | Status: DC

## 2012-07-11 MED ORDER — PANTOPRAZOLE SODIUM 40 MG PO TBEC
40.0000 mg | DELAYED_RELEASE_TABLET | Freq: Every day | ORAL | Status: DC
  Administered 2012-07-11 – 2012-07-12 (×2): 40 mg via ORAL
  Filled 2012-07-11 (×2): qty 1

## 2012-07-11 MED ORDER — PREDNISONE 50 MG PO TABS
60.0000 mg | ORAL_TABLET | Freq: Every day | ORAL | Status: DC
  Administered 2012-07-11 – 2012-07-12 (×2): 60 mg via ORAL
  Filled 2012-07-11 (×4): qty 1

## 2012-07-11 MED ORDER — ALBUTEROL SULFATE HFA 108 (90 BASE) MCG/ACT IN AERS: 1.0000 | INHALATION_SPRAY | RESPIRATORY_TRACT | Status: DC | PRN

## 2012-07-11 MED ORDER — VANCOMYCIN HCL IN DEXTROSE 1-5 GM/200ML-% IV SOLN
1000.0000 mg | Freq: Two times a day (BID) | INTRAVENOUS | Status: DC
  Filled 2012-07-11 (×2): qty 200

## 2012-07-11 MED ORDER — BISOPROLOL FUMARATE 5 MG PO TABS
5.0000 mg | ORAL_TABLET | Freq: Every day | ORAL | Status: DC
  Administered 2012-07-12: 5 mg via ORAL
  Filled 2012-07-11 (×2): qty 1

## 2012-07-11 MED ORDER — LEVOFLOXACIN 750 MG PO TABS
750.0000 mg | ORAL_TABLET | Freq: Every day | ORAL | Status: DC
  Administered 2012-07-11 – 2012-07-12 (×2): 750 mg via ORAL
  Filled 2012-07-11 (×2): qty 1

## 2012-07-11 MED ORDER — HYDROCHLOROTHIAZIDE 25 MG PO TABS
25.0000 mg | ORAL_TABLET | Freq: Every day | ORAL | Status: DC
  Administered 2012-07-12: 25 mg via ORAL
  Filled 2012-07-11 (×2): qty 1

## 2012-07-11 NOTE — Progress Notes (Signed)
Name: Karina Weber MRN: [id number] DOB: 1955/12/12    LOS: 2  Referring Provider:  EDP  Reason for Referral:  Acute Resp Failure   PULMONARY / CRITICAL CARE MEDICINE  Brief patient description:  56 y/o F, smoker, with PMH of HTN, ETOH abuse, COPD, RUL non-small cell CA s/p radiation 2011 (unclear hx in EPIC), with recent admit 9/2-9//5 for acute on chronic resp fx andR heart failure who failed to follow up, was admitted for hypercarbic respiratory failure.  Events Since Admission: 10/7 - Admit with AECOPD, hypercarbic resp failure, intubated in ED 10/8 - Extubated  Subjective: Stable on [place] 3L (uses 2L at home)  Vital Signs: Temp:  [98.3 F (36.8 C)-98.6 F (37 C)] 98.3 F (36.8 C) (10/09 0348) Pulse Rate:  [67-112] 104  (10/09 0735) Resp:  [16-29] 25  (10/09 0735) BP: (139-172)/(60-128) 164/61 mmHg (10/09 0735) SpO2:  [92 %-100 %] 96 % (10/09 0735) FiO2 (%):  [30 %-40 %] 30 % (10/08 1500)  Physical Examination: General:  NAD Neuro: Awake, alert, talkative HEENT: [place] Cardiovascular:  RRR m/r/g Lungs: Diffuse wheezing, good effort. Rhonchi throughout Abdomen: +BS, soft Musculoskeletal:  No acute deformities Skin:  Warm/dry, no edema, scattered lower extremity scars  Active Problems:  Acute respiratory failure  Hypoxemia  COPD (chronic obstructive pulmonary disease)  Bronchitis   ASSESSMENT AND PLAN  PULMONARY  Lab 07/09/12 1737 07/09/12 1434  PHART 7.259* 7.087*  PCO2ART 73.4* 124.3*  PO2ART 254.0* 107.0*  HCO3 32.9* 37.4*  O2SAT 100.0 94.0   Ventilator Settings: Vent Mode:  [-] PRVC FiO2 (%):  [30 %-40 %] 30 % Set Rate:  [16 bmp] 16 bmp Vt Set:  [500 mL] 500 mL PEEP:  [5 cmH20] 5 cmH20 Pressure Support:  [10 cmH20] 10 cmH20 Plateau Pressure:  [4 cmH20-27 cmH20] 27 cmH20  CXR:  10/9- Bibasilar haziness unchanged ?atelectasis   ETT:  10/7>>>10/8  A:   Acute on Chronic Respiratory Failure Hypercarbia / Hypoxia RUL Non-small cell CA s/p  radiation COPD  P:   - Continue supplemental O2 (uses 2L continuously at home) - IV steroids>>change to PO steroids today - Combivent q6 hours scheduled, with Albuterol prn - Alpha-1 antitrypsin collected this morning  CARDIOVASCULAR  Lab 07/09/12 1750 07/09/12 1708  TROPONINI -- --  LATICACIDVEN -- 0.9  PROBNP 1380.0* --   ECG:  NSR on monitor Lines: Peripheral lines  Echo: EF 60-65%  A:  Sinus Tachycardia at admission. History of right heart failure P:  - Tele monitoring - BP stable  RENAL  Lab 07/11/12 0440 07/10/12 0410 07/09/12 1750 07/09/12 1425  NA 139 141 141 138  K 3.8 3.9 -- --  CL 97 102 100 98  CO2 32 30 28 29   BUN 14 11 16 16   CREATININE 0.42* 0.46* 0.58 0.57  CALCIUM 9.5 9.3 10.2 9.9  MG -- 1.8 1.8 1.7  PHOS -- 2.5 4.7* --   Intake/Output      10/08 0701 - 10/09 0700 10/09 0701 - 10/10 0700   I.V. (mL/kg) 49 (1.1)    Other 180    NG/GT     IV Piggyback 260    Total Intake(mL/kg) 489 (10.5)    Urine (mL/kg/hr) 415 (0.4)    Total Output 415    Net +74          Foley:  10/7>>>10/8  A:   No acute issues  P:   - Con't to monitor UOP, adequate - D/C Foley today - Follow daily  BMP  GASTROINTESTINAL  Lab 07/11/12 0440 07/09/12 1750 07/09/12 1425  AST 10 14 12   ALT 6 8 8   ALKPHOS 100 145* 147*  BILITOT 0.2* 0.2* 0.2*  PROT 6.6 8.2 8.0  ALBUMIN 2.9* 3.6 3.6    A:   Mildly elevated AlkPhos -? If bone mets. Elevated in past as well (as far back as 2008) LFT normal  P:   - Monitor with AM labs - Alk Phos wnl today - Advance diet  HEMATOLOGIC  Lab 07/11/12 0440 07/10/12 0410 07/09/12 1750 07/09/12 1425  HGB 10.3* 10.0* 10.4* 12.2  HCT 34.2* 33.7* 36.1 41.7  PLT 340 294 266 320  INR -- -- 1.00 --  APTT -- -- 38* --   A:   No acute issues.  No evidence of bleeding.   P:  - Monitor cbc. HgB stable  INFECTIOUS  Lab 07/11/12 0440 07/10/12 0410 07/09/12 1750 07/09/12 1746 07/09/12 1425  WBC 10.0 9.3 10.3 -- 11.9*   PROCALCITON 0.75 1.16 -- 0.25 --   Cultures: 10/7 Sputum>>>Not collected 10/7 BCx2>>> NGTD 10/7 UCx>>>  Antibiotics: Vanco 10/7>>>10/8 Zosyn 10/7>>>10/8 Levaquin 10/8>>>  A:   AECOPD - recent admit to hospital for same.  Covered broadly at admission  P:   - Broad Abx as above, Narrow to Levaquin today for COPD exacerbation - Monitor for fevers  ENDOCRINE  Lab 07/11/12 0001 07/10/12 1300 07/10/12 0636 07/09/12 2356  GLUCAP 117* 141* 138* 150*   A:   No acute issues  P:   -CBG's qac/qhs  NEUROLOGIC  A:   Encephalopathy - in setting of hypercarbic / hypoxic respiratory failure  P:   - Overall improved mental status.    BEST PRACTICE / DISPOSITION Level of Care:  ICU >> to floor 10/9, to IMTS 10/9 Primary Service:  PCCM Consultants:   Code Status:  Full Diet: full DVT Px:  Heparin  GI Px:  Protonix Skin Integrity:  Intact Social / Family:  None available at this time.  Dispo: Transfer to [employer]. Hairford, M.D. 07/11/2012 7:41 AM   Levy Pupa, MD, PhD 07/11/2012, 9:41 AM [facility] [phone] or if no answer [phone]

## 2012-07-11 NOTE — H&P (Signed)
Hospital Admission Note Date: 07/11/2012  Patient name: Karina Weber Medical record number: [id number] Date of birth: [birth year] Age: 56 y.o. Gender: female PCP: PRIBULA,CHRISTOPHER, MD  Medical Service: Internal Medicine  Attending physician: Dr. Orvan Falconer    1st Contact:Mclean    Pager: [phone] 2nd Contact: Ho    Pager: [phone] After 5 pm or weekends: 1st Contact:      Pager: [phone] 2nd Contact:      Pager: [phone]  Chief Complaint: SOB and cough  Problem List 1. Acute hypercarbic respiratory failure- resolved     ( a) COPD     ( b) RUL non small cell CA s/p radiation in 2011  2. Substance abuse  3. HTN  4. Malnutrition  5.  History of Left humerus fracture      History of Present Illness:56 year old woman with past medical history significant for of HTN,  COPD on 2 litres of home O2, RUL non-small cell CA s/p radiation 2011, with recent admit 9/2-9//5 for acute on chronic resp fx, right sided heart failure and was scheduled to follow up in the pulmonary office on 10/4 but was a no show presented to ER on 10/7 via EMS with complaints progressive shortness of breath and cough requiring intubation.   Patient reports that she has off and on difficulty breathing and was in her usual state of health until the morning of her admission when she woke up and couldn't catch her breath. She had a coughing spell and the next thing that she remembers was being in the EMS . " I was not thinking right"  EMS reported on scene that she was hypoxic with sats in 50's, distressed and combative. In ER was placed on BiPap but became more somnolent and required intubation. She was extubated yesterday ( 10/8) and transferred to IMTS for further care.   She also reports that she has been undergoing a lot of stress lately with finances. She is looking for a new place to live and her food stamps have been cut down.  She was requesting assistance with that.    Current Outpatient Prescriptions on File  Prior to Encounter  Medication Sig Dispense Refill  . albuterol (VENTOLIN HFA) 108 (90 BASE) MCG/ACT inhaler Inhale 1 puff into the lungs every 4 (four) hours as needed for wheezing.  18 g  6  . aspirin 81 MG tablet Take 1 tablet (81 mg total) by mouth daily.  31 tablet  1  . bisoprolol (ZEBETA) 5 MG tablet Take 1 tablet (5 mg total) by mouth daily.  30 tablet  0  . mirtazapine (REMERON) 15 MG tablet Take 1 tablet (15 mg total) by mouth at bedtime.  30 tablet  6  . tiotropium (SPIRIVA HANDIHALER) 18 MCG inhalation capsule Place 1 capsule (18 mcg total) into inhaler and inhale daily.  30 capsule  1  . DISCONTD: ferrous gluconate (FERGON) 325 MG tablet Take 325 mg by mouth 3 (three) times daily with meals.        Marland Kitchen DISCONTD: losartan (COZAAR) 100 MG tablet Take 1 tablet (100 mg total) by mouth daily.  31 tablet  11   Allergies: Allergies as of 07/09/2012 - Review Complete 07/09/2012  Allergen Reaction Noted  . Lisinopril Cough 07/28/2011   Past Medical History  Diagnosis Date  . Tobacco abuse   . COPD (chronic obstructive pulmonary disease)     exacerbagtion in 6/12 and in 2010.   Marland Kitchen Hypertension   . History of radiation  therapy 07/19/10 to 07/28/10    RUL lung  . 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 Alcohol abuse     2 beers a day  . Asthma    Past Surgical History  Procedure Date  . No past surgeries    Family History  Problem Relation Age of Onset  . Heart disease Neg Hx   . Cancer Mother     unknown type   History   Social History  . Marital Status: Single    Spouse Name: N/A    Number of Children: N/A  . Years of Education: N/A   Occupational History  . Not on file.   Social History Main Topics  . Smoking status: Current Some Day Smoker -- 0.2 packs/day for 40 years    Types: Cigarettes  . Smokeless tobacco: Never Used   Comment: Cut back from 2 ppd when diagnosed with Lung cancer.12/02/11 1 cig/day  . Alcohol Use: No     Former alcoholic  . Drug Use:  No  . Sexually Active: Not Currently   Other Topics Concern  . Not on file   Social History Narrative   Lives at home with a roommate (the roommate's son had a kid with the patient's daughter but they are not married). She takes care of herself, does not have any assistance when she is sick. He is able to perform ADLs when she is not sick . Used to work in [employer], retired 2 years ago . Said it up to 10th grade . SHe on Medicaid now. Smokes 1 pack per day. Last smoke yesterday. Drinks 2 cans of beer every day. Never did drugs .    Review of Systems: Pertinent items are noted in HPI.  Physical Exam: Blood pressure 121/61, pulse 110, temperature 98.7 F (37.1 C), temperature source Oral, resp. rate 25, height 5\' 4"  (1.626 m), weight 102 lb 4.7 oz (46.4 kg), SpO2 94.00%. BP 121/61  Pulse 110  Temp 98.7 F (37.1 C) (Oral)  Resp 25  Ht 5\' 4"  (1.626 m)  Wt 102 lb 4.7 oz (46.4 kg)  BMI 17.56 kg/m2  SpO2 94%  General Appearance:    Alert, cooperative, mild respiratory distress, sitting in the chair  Head:    Normocephalic, without obvious abnormality, atraumatic  Eyes:    PERRL, conjunctiva/corneas clear, EOM's intact, fundi    benign, both eyes  Ears:    Normal TM's and external ear canals, both ears  Nose:   Nares normal, septum midline, mucosa normal, no drainage    or sinus tenderness  Throat:   Lips, mucosa, and tongue normal; poor dentition  Neck:   Supple, symmetrical, trachea midline, no adenopathy;    thyroid:  no enlargement/tenderness/nodules; no carotid   bruit or JVD  Back:     Symmetric, no curvature, ROM normal, no CVA tenderness  Lungs:     Coarse breath sounds bilaterally - rhonchi and crackles present all over( right worse than left)  Chest Wall:    No tenderness or deformity   Heart:    Regular rate and rhythm, S1 and S2 normal, no murmur, rub   or gallop  Breast Exam:    No tenderness, masses, or nipple abnormality  Abdomen:     Soft, non-tender, bowel sounds  active all four quadrants,    no masses, no organomegaly  Genitalia:    Normal female without lesion, discharge or tenderness  Rectal:    Normal tone, normal  prostate, no masses or tenderness;   guaiac negative stool  Extremities:   Extremities normal, atraumatic, no cyanosis or edema  Pulses:   2+ and symmetric all extremities  Skin:   Skin color, texture, turgor normal, no rashes or lesions  Lymph nodes:   Cervical, supraclavicular, and axillary nodes normal  Neurologic:   CNII-XII intact, normal strength, sensation and reflexes    throughout    Lab results: Basic Metabolic Panel:  Basename 07/11/12 0440 07/10/12 0410 07/09/12 1750  NA 139 141 --  K 3.8 3.9 --  CL 97 102 --  CO2 32 30 --  GLUCOSE 106* 122* --  BUN 14 11 --  CREATININE 0.42* 0.46* --  CALCIUM 9.5 9.3 --  MG -- 1.8 1.8  PHOS -- 2.5 4.7*   Liver Function Tests:  Basename 07/11/12 0440 07/09/12 1750  AST 10 14  ALT 6 8  ALKPHOS 100 145*  BILITOT 0.2* 0.2*  PROT 6.6 8.2  ALBUMIN 2.9* 3.6     Basename 07/11/12 0440 07/10/12 0410 07/09/12 1750 07/09/12 1425  WBC 10.0 9.3 -- --  NEUTROABS -- -- 9.9* 8.1*  HGB 10.3* 10.0* -- --  HCT 34.2* 33.7* -- --  MCV 85.1 84.5 -- --  PLT 340 294 -- --   Cardiac Enzymes: No results found for this basename: CKTOTAL:3,CKMB:3,CKMBINDEX:3,TROPONINI:3 in the last 72 hours BNP:  Basename 07/09/12 1750  PROBNP 1380.0*   D-Dimer: No results found for this basename: DDIMER:2 in the last 72 hours CBG:  Basename 07/11/12 0623 07/11/12 0001 07/10/12 1300 07/10/12 0636 07/09/12 2356  GLUCAP 107* 117* 141* 138* 150*     Basename 07/09/12 1750  LABPROT 13.1  INR 1.00   Urine Drug Screen: Drugs of Abuse     Component Value Date/Time   LABOPIA NEGATIVE 07/09/2012 1535   LABOPIA NONE DETECTED 11/27/2009 0514   COCAINSCRNUR POSITIVE* 07/09/2012 1535   COCAINSCRNUR NONE DETECTED 11/27/2009 0514   LABBENZ NEGATIVE 07/09/2012 1535   LABBENZ NONE DETECTED 11/27/2009 0514     AMPHETMU NEGATIVE 07/09/2012 1535   AMPHETMU NONE DETECTED 11/27/2009 0514   THCU NONE DETECTED 11/27/2009 0514   LABBARB  Value: NONE DETECTED        DRUG SCREEN FOR MEDICAL PURPOSES ONLY.  IF CONFIRMATION IS NEEDED FOR ANY PURPOSE, NOTIFY LAB WITHIN 5 DAYS.        LOWEST DETECTABLE LIMITS FOR URINE DRUG SCREEN Drug Class       Cutoff (ng/mL) Amphetamine      1000 Barbiturate      200 Benzodiazepine   200 Tricyclics       300 Opiates          300 Cocaine          300 THC              50 11/27/2009 0514    Alcohol Level: No results found for this basename: ETH:2 in the last 72 hours Urinalysis:  Basename 07/09/12 1536  COLORURINE YELLOW  LABSPEC 1.021  PHURINE 6.0  GLUCOSEU NEGATIVE  HGBUR TRACE*  BILIRUBINUR NEGATIVE  KETONESUR NEGATIVE  PROTEINUR >300*  UROBILINOGEN 0.2  NITRITE NEGATIVE  LEUKOCYTESUR NEGATIVE     Imaging results:  Dg Chest Port 1 View  07/11/2012  *RADIOLOGY REPORT*  Clinical Data: COPD, ventilator dependent respiratory failure, shortness of breath.  PORTABLE CHEST - 1 VIEW  Comparison: 07/10/2012.  Findings: Endotracheal tube has been removed in the interval. Nasogastric tube has also been removed.  Heart is  at the upper limits of normal in size, stable.  Thoracic aorta is calcified. Minimal streaky opacification of the lung bases.  Lungs are otherwise clear.  No pleural fluid.  IMPRESSION: Minimal bibasilar atelectasis or scarring.   Original Report Authenticated By: Reyes Ivan, M.D.    Dg Chest Port 1 View  07/10/2012  *RADIOLOGY REPORT*  Clinical Data: Evaluate endotracheal tube position.  COPD.  Lung cancer.  PORTABLE CHEST - 1 VIEW  Comparison: 1 day prior  Findings: Endotracheal tube terminates 2.6 cm above carina. Nasogastric extends beyond the  inferior aspect of the film. Numerous leads and wires project over the chest.  Patient rotated to the left.  Mild cardiomegaly. No pleural effusion or pneumothorax.  Similar mild interstitial thickening.  No  overt congestive failure. No lobar consolidation.  IMPRESSION:  1. No significant change since one day prior. 2.  Similar mild interstitial thickening.  Favored to be related to chronic bronchitis/smoking.  Mild pulmonary venous congestion could look similar.   Original Report Authenticated By: Consuello Bossier, M.D.    Dg Chest Port 1 View  07/09/2012  *RADIOLOGY REPORT*  Clinical Data: Shortness of breath.  COPD.  PORTABLE CHEST - 1 VIEW  Comparison: One-view chest 07/09/2012 at 02:27 p.m.  Findings: The patient has been intubated.  Endotracheal tube terminates 3.6 cm above the carina.  An NG tube courses off the inferior border of the film.  The heart size is normal. Mild pulmonary vascular congestion is stable.  IMPRESSION:  1.  Interval intubation and placement of NG tube. 2.  The support apparatus is in satisfactory position without radiographic evidence for complication. 3.  Stable pulmonary vascular congestion.   Original Report Authenticated By: Jamesetta Orleans. MATTERN, M.D.    Dg Chest Portable 1 View  07/09/2012  *RADIOLOGY REPORT*  Clinical Data: Respiratory distress.  PORTABLE CHEST - 1 VIEW  Comparison: 06/04/2012  Findings: Mild cardiomegaly.  Mediastinal and hilar contours are normal.  Slight interstitial thickening likely due to congestion. No focal airspace abnormalities.  No effusions.  No bony abnormalities.  IMPRESSION: Cardiomegaly and stable chronic vascular congestion without focal abnormalities.   Original Report Authenticated By: Mervin Hack, M.D.    Dg Chest Port 1v Same Day  07/09/2012  *RADIOLOGY REPORT*  Clinical Data: Intubation  PORTABLE CHEST - 1 VIEW SAME DAY  Comparison: Earlier same day; 06/04/2012  Findings:  Grossly unchanged enlarged cardiac silhouette and mediastinal contours.  Endotracheal tube overlies tracheal air column with tip superior to the carina.  Enteric tube terminates inferior to left hemidiaphragm.  Lung volumes remain hyperexpanded.  Pulmonary  vasculature is indistinct with cephalization of flow.  Grossly unchanged perihilar opacities.  No new focal airspace opacities. No definite pleural effusion or pneumothorax.  Unchanged bones.  IMPRESSION: 1.  1.  Stable positioning of support apparatus.  No pneumothorax.  2. Grossly stable mild port is just without frank evidence of edema. 3.  Perihilar opacities favored to represent atelectasis.   Original Report Authenticated By: Waynard Reeds, M.D.     Other results: EKG: sinus tachycardia, no acute ST- T wave changes.  Assessment & Plan by Problem: Ms. Reinecke is 56 year old woman with past medical history significant for COPD on 2 L of home oxygen, right upper lobe non- small cell carcinoma status post radiation, ongoing tobacco use presented to the ED on 7 2013 for worsening shortness of breath and cough. She was intubated on her arrival secondary to acute hypercarbic and hypoxic  respiratory failure and was extubated on 07/10/2012. She was under the care of PCCM transfer today for further management.    #Acute on chronic respiratory failure: Likely secondary to acute exacerbation of COPD in the setting of ongoing tobacco abuse and not keeping her regular appointments. She also has a history of non-small same lung cancer status post radiation. Her ABG on admission i .e 10/7 showed respiratory acidosis and was likely consistent with mixed hypoxic and hypercarbic respiratory failure.  She was intubated on 10/ 7 and extubated on 10/ 8 . Her chest x-ray with hyperinflated lung disease consistent with COPD with no focal infiltrates .Sputum cultures were not obtained. Blood cultures show no growth to date. She was empirically treated with broad-spectrum antibiotics( vanc and zosyn for 2 days) and was transitioned to Levaquin today. She had mild leukocytosis to 11.9 on presentation - Agree with continuing levaquin for now for acute on chronic COPD excac. - Continue PO steroids - PRN nebs - Continue  combivent and albuterol PRN inhalers   # COPD (chronic obstructive pulmonary disease): see #1  # Right sided heart failure( per echo on 09/13): However not seen on repeat echo(?) -Continue ASA and  Bisoprolol for now  # Normocytic Anemia: Iron- 29, ferritin -15 in June 2012. Her baseline is ~ 10.5-11. Patient is at her baseline. Continue to monitor   # Depression: Stable. Continue remeron.  # Financial assistance: Case manager consult  # DVT: heparin     Signed: Ranger Petrich 07/11/2012, 10:49 AM

## 2012-07-12 ENCOUNTER — Inpatient Hospital Stay (HOSPITAL_COMMUNITY): Payer: Medicare Other

## 2012-07-12 DIAGNOSIS — F1910 Other psychoactive substance abuse, uncomplicated: Secondary | ICD-10-CM | POA: Diagnosis present

## 2012-07-12 LAB — CBC
MCV: 85.5 fL (ref 78.0–100.0)
Platelets: 369 10*3/uL (ref 150–400)
RBC: 4.33 MIL/uL (ref 3.87–5.11)
WBC: 8.5 10*3/uL (ref 4.0–10.5)

## 2012-07-12 LAB — COMPREHENSIVE METABOLIC PANEL
Albumin: 2.7 g/dL — ABNORMAL LOW (ref 3.5–5.2)
BUN: 22 mg/dL (ref 6–23)
Calcium: 9.4 mg/dL (ref 8.4–10.5)
Chloride: 96 mEq/L (ref 96–112)
Creatinine, Ser: 0.48 mg/dL — ABNORMAL LOW (ref 0.50–1.10)
GFR calc non Af Amer: >90 mL/min (ref >90)
Total Bilirubin: 0.2 mg/dL — ABNORMAL LOW (ref 0.3–1.2)

## 2012-07-12 MED ORDER — ALBUTEROL SULFATE HFA 108 (90 BASE) MCG/ACT IN AERS: 2.0000 | INHALATION_SPRAY | RESPIRATORY_TRACT | Status: DC | PRN

## 2012-07-12 MED ORDER — PREDNISONE 10 MG PO TABS: 10.0000 mg | ORAL_TABLET | Freq: Every day | ORAL | Status: DC

## 2012-07-12 MED ORDER — DOXYCYCLINE HYCLATE 50 MG PO CAPS: 100.0000 mg | ORAL_CAPSULE | Freq: Two times a day (BID) | ORAL | Status: DC

## 2012-07-12 MED ORDER — FLUTICASONE-SALMETEROL 500-50 MCG/DOSE IN AEPB: 1.0000 | INHALATION_SPRAY | Freq: Two times a day (BID) | RESPIRATORY_TRACT | Status: DC

## 2012-07-12 MED ORDER — BISOPROLOL FUMARATE 5 MG PO TABS: 5.0000 mg | ORAL_TABLET | Freq: Every day | ORAL | Status: DC

## 2012-07-12 MED ORDER — MIRTAZAPINE 15 MG PO TABS: 15.0000 mg | ORAL_TABLET | Freq: Every day | ORAL | Status: DC

## 2012-07-12 MED ORDER — ASPIRIN 81 MG PO TABS: 81.0000 mg | ORAL_TABLET | Freq: Every day | ORAL | Status: DC

## 2012-07-12 MED ORDER — TIOTROPIUM BROMIDE MONOHYDRATE 18 MCG IN CAPS: 18.0000 ug | ORAL_CAPSULE | Freq: Every day | RESPIRATORY_TRACT | Status: DC

## 2012-07-12 NOTE — Progress Notes (Signed)
Pt discharged to home per MD order. Pt received all discharge instructions and medication information including follow-up appointments and prescriptions.  Pt alert and oriented at discharge with no complaints of pain.  RN called [facility] and confirmed that they will deliver her prescriptions to her home.  Pt also received and reviewed tobacco cessation education prior to discharge.  Pt escorted to private vehicle via wheelchair.  Karina Weber

## 2012-07-12 NOTE — Progress Notes (Signed)
Name: Karina Weber MRN: [id number] DOB: [birth year]    LOS: 3  Referring Provider:  EDP  Reason for Referral:  Acute Resp Failure   PULMONARY / CRITICAL CARE MEDICINE  Brief patient description:  56 y/o F, smoker, with PMH of HTN, ETOH abuse, COPD, RUL non-small cell CA s/p radiation 2011 (unclear hx in EPIC), with recent admit 9/2-9//5 for acute on chronic resp fx andR heart failure who failed to follow up, was admitted for hypercarbic respiratory failure.  Events Since Admission: 10/7 - Admit with AECOPD, hypercarbic resp failure, intubated in ED 10/8 - Extubated  Subjective: Stable on [place] 3L (uses 2L at home)  Vital Signs: Temp:  [97.9 F (36.6 C)-98.3 F (36.8 C)] 98.1 F (36.7 C) (10/10 0500) Pulse Rate:  [94-117] 97  (10/10 0934) Resp:  [24-30] 24  (10/09 1403) BP: (121-157)/(68-96) 121/76 mmHg (10/10 0934) SpO2:  [92 %-98 %] 97 % (10/10 0500) Weight:  [45.995 kg (101 lb 6.4 oz)] 45.995 kg (101 lb 6.4 oz) (10/10 0500)  Physical Examination: General:  NAD Neuro: Awake, alert, talkative HEENT: [place] Cardiovascular:  RRR m/r/g Lungs: Diffuse wheezing, good effort. Rhonchi throughout Abdomen: +BS, soft Musculoskeletal:  No acute deformities Skin:  Warm/dry, no edema, scattered lower extremity scars  Principal Problem:  *Acute respiratory failure Active Problems:  Hypoxemia  COPD (chronic obstructive pulmonary disease)  Bronchitis   ASSESSMENT AND PLAN  PULMONARY  Lab 07/09/12 1737 07/09/12 1434  PHART 7.259* 7.087*  PCO2ART 73.4* 124.3*  PO2ART 254.0* 107.0*  HCO3 32.9* 37.4*  O2SAT 100.0 94.0   Ventilator Settings:    CXR:  10/9- Bibasilar haziness unchanged ?atelectasis   ETT:  10/7>>>10/8  A:   Acute on Chronic Respiratory Failure Hypercarbia / Hypoxia RUL Non-small cell CA s/p radiation COPD  P:   - Continue supplemental O2 (uses 2L continuously at home) - IV steroids>>change to PO steroids 10-9 wean q 4 days by 10 - Combivent q6 hours  scheduled, with Albuterol prn - Alpha-1 antitrypsin collected 10/9  CARDIOVASCULAR  Lab 07/09/12 1750 07/09/12 1708  TROPONINI -- --  LATICACIDVEN -- 0.9  PROBNP 1380.0* --   ECG:  NSR on monitor Lines: Peripheral lines  Echo: EF 60-65%  A:  Sinus Tachycardia at admission. History of right heart failure P:  - Tele monitoring - BP stable  RENAL  Lab 07/12/12 0650 07/11/12 0440 07/10/12 0410 07/09/12 1750 07/09/12 1425  NA 141 139 141 141 138  K 3.5 3.8 -- -- --  CL 96 97 102 100 98  CO2 38* 32 30 28 29   BUN 22 14 11 16 16   CREATININE 0.48* 0.42* 0.46* 0.58 0.57  CALCIUM 9.4 9.5 9.3 10.2 9.9  MG -- -- 1.8 1.8 1.7  PHOS -- -- 2.5 4.7* --   Intake/Output      10/09 0701 - 10/10 0700 10/10 0701 - 10/11 0700   P.O.  360   I.V. (mL/kg)     Other 0    IV Piggyback     Total Intake(mL/kg)  360 (7.8)   Urine (mL/kg/hr)     Total Output     Net 0 +360         Foley:  10/7>>>10/8  A:   No acute issues  P:   - Con't to monitor UOP, adequate - D/C Foley 10/9 - Follow daily BMP  GASTROINTESTINAL  Lab 07/12/12 0650 07/11/12 0440 07/09/12 1750 07/09/12 1425  AST 13 10 14 12   ALT 6 6 8  8  ALKPHOS 85 100 145* 147*  BILITOT 0.2* 0.2* 0.2* 0.2*  PROT 6.0 6.6 8.2 8.0  ALBUMIN 2.7* 2.9* 3.6 3.6    A:   Mildly elevated AlkPhos -? If bone mets. Elevated in past as well (as far back as 2008) LFT normal  P:   - Monitor with AM labs - Alk Phos wnl  - Advance diet  HEMATOLOGIC  Lab 07/12/12 0650 07/11/12 0440 07/10/12 0410 07/09/12 1750 07/09/12 1425  HGB 10.9* 10.3* 10.0* 10.4* 12.2  HCT 37.0 34.2* 33.7* 36.1 41.7  PLT 369 340 294 266 320  INR -- -- -- 1.00 --  APTT -- -- -- 38* --   A:   No acute issues.  No evidence of bleeding.   P:  - Monitor cbc. HgB stable  INFECTIOUS  Lab 07/12/12 0650 07/11/12 0440 07/10/12 0410 07/09/12 1750 07/09/12 1746 07/09/12 1425  WBC 8.5 10.0 9.3 10.3 -- 11.9*  PROCALCITON -- 0.75 1.16 -- 0.25 --   Cultures: 10/7  Sputum>>>Not collected 10/7 BCx2>>> NGTD 10/7 UCx>>>neg  Antibiotics: Vanco 10/7>>>10/8 Zosyn 10/7>>>10/8 Levaquin 10/8>>>  A:   AECOPD - recent admit to hospital for same.  Covered broadly at admission  P:   - Broad Abx as above, Narrow to Levaquin today for COPD exacerbation - Monitor for fevers  ENDOCRINE  Lab 07/11/12 2030 07/11/12 1706 07/11/12 1247 07/11/12 1211 07/11/12 0623  GLUCAP 110* 133* 113* 64* 107*   A:   No acute issues  P:   -CBG's qac/qhs  NEUROLOGIC  A:   Encephalopathy - in setting of hypercarbic / hypoxic respiratory failure  P:   - Overall improved mental status.   PCCM available PRN    BEST PRACTICE / DISPOSITION Level of Care:  ICU >> to floor 10/9, to IMTS 10/9 Primary Service:  Teaching service Consultants:   Code Status:  Full Diet: full DVT Px:  Heparin  GI Px:  Protonix Skin Integrity:  Intact Social / Family:  None available at this time.  Dispo: Transfer to [employer] Minor ACNP Ad[facility] Pollack PCCM Pager [phone] till 3 pm If no answer page [phone] 07/12/2012, 10:33 AM  [phone] or if no answer [phone]   I agree with Brett Canales Minor's note. I personally reviewed the electronic medical records, laboratory tests and images. I discussed the assessment and plan with Brett Canales Minor. She will need follow up as outpatient with pulmonology and with Dr. Mitzi Hansen.   Vanetta Mulders, MD  [facility]  [place], [place] [phone]

## 2012-07-12 NOTE — Progress Notes (Addendum)
Problem list: 1.Acute on chronic respiratory failure likely 2/2 acute on chronic COPD exacerbation 2. COPD (chronic obstructive pulmonary disease) 3. Right sided heart failure( per echo on 09/13) 4.  Normocytic Anemia 5. Depression 6. Financial assistance 7. Substance abuse  Subjective: Patient has improved breathing since admission.  She reports being hoarse.  She is still coughing brown sputum without blood.  She denies cp.  She denies cocaine use though +UDS.  She is out of her medications and does not have transportation to get her medications.  She used to go to [facility] [phone] but now goes to [facility] [phone].  She was unaware they would deliver medications to her home.    Overnight: afebrile, tachypneic low 20s, tachycardic 90s-100 (sinus)  Objective: Vital signs in last 24 hours: Filed Vitals:   07/11/12 2147 07/12/12 0500 07/12/12 0934 07/12/12 1339  BP:  143/73 121/76 114/71  Pulse:  99 97 85  Temp:  98.1 F (36.7 C)  99 F (37.2 C)  TempSrc:  Oral    Resp:    24  Height:      Weight:  101 lb 6.4 oz (45.995 kg)    SpO2: 98% 97%  97%   Weight change:   Intake/Output Summary (Last 24 hours) at 07/12/12 1419 Last data filed at 07/12/12 0800  Gross per 24 hour  Intake    360 ml  Output      0 ml  Net    360 ml   General: lying in bed eating, nad, alert and oriented x 3 HEENT: [place]/at, nl scleral icterus CV: RRR no r/m/g s1/s2 noted  Lungs: min. crackles b/l  Ab: soft, ntnd, +bs  Extremities: warm, no cyanosis/edema Neuro: CN 2-12 grossly intact, moving all 4 extremities   Lab Results: Basic Metabolic Panel:  Lab 07/12/12 9528 07/11/12 0440 07/10/12 0410 07/09/12 1750  NA 141 139 -- --  K 3.5 3.8 -- --  CL 96 97 -- --  CO2 38* 32 -- --  GLUCOSE 102* 106* -- --  BUN 22 14 -- --  CREATININE 0.48* 0.42* -- --  CALCIUM 9.4 9.5 -- --  MG -- -- 1.8 1.8  PHOS -- -- 2.5 4.7*   Liver Function Tests:  Lab 07/12/12 0650 07/11/12  0440  AST 13 10  ALT 6 6  ALKPHOS 85 100  BILITOT 0.2* 0.2*  PROT 6.0 6.6  ALBUMIN 2.7* 2.9*   CBC:  Lab 07/12/12 0650 07/11/12 0440 07/09/12 1750 07/09/12 1425  WBC 8.5 10.0 -- --  NEUTROABS -- -- 9.9* 8.1*  HGB 10.9* 10.3* -- --  HCT 37.0 34.2* -- --  MCV 85.5 85.1 -- --  PLT 369 340 -- --   BNP:  Lab 07/09/12 1750  PROBNP 1380.0*   CBG:  Lab 07/12/12 1126 07/12/12 0728 07/11/12 2030 07/11/12 1706 07/11/12 1247 07/11/12 1211  GLUCAP 158* 101* 110* 133* 113* 64*   Coagulation:  Lab 07/09/12 1750  LABPROT 13.1  INR 1.00   Urine Drug Screen: Drugs of Abuse     Component Value Date/Time   LABOPIA NEGATIVE 07/09/2012 1535   LABOPIA NONE DETECTED 11/27/2009 0514   COCAINSCRNUR POSITIVE* 07/09/2012 1535   COCAINSCRNUR NONE DETECTED 11/27/2009 0514   LABBENZ NEGATIVE 07/09/2012 1535   LABBENZ NONE DETECTED 11/27/2009 0514   AMPHETMU NEGATIVE 07/09/2012 1535   AMPHETMU NONE DETECTED 11/27/2009 0514   THCU NONE DETECTED 11/27/2009 0514   LABBARB  Value: NONE DETECTED  DRUG SCREEN FOR MEDICAL PURPOSES ONLY.  IF CONFIRMATION IS NEEDED FOR ANY PURPOSE, NOTIFY LAB WITHIN 5 DAYS.        LOWEST DETECTABLE LIMITS FOR URINE DRUG SCREEN Drug Class       Cutoff (ng/mL) Amphetamine      1000 Barbiturate      200 Benzodiazepine   200 Tricyclics       300 Opiates          300 Cocaine          300 THC              50 11/27/2009 0514    Urinalysis:  Lab 07/09/12 1536  COLORURINE YELLOW  LABSPEC 1.021  PHURINE 6.0  GLUCOSEU NEGATIVE  HGBUR TRACE*  BILIRUBINUR NEGATIVE  KETONESUR NEGATIVE  PROTEINUR >300*  UROBILINOGEN 0.2  NITRITE NEGATIVE  LEUKOCYTESUR NEGATIVE   Misc. Labs: pending alpha 1 antitrypsin  Micro Results: Recent Results (from the past 240 hour(s))  URINE CULTURE     Status: Normal   Collection Time   07/09/12  3:35 PM      Component Value Range Status Comment   Specimen Description URINE, CATHETERIZED   Final    Special Requests ADDED 07/09/12 1951   Final     Culture  Setup Time 07/10/2012 03:16   Final    Colony Count NO GROWTH   Final    Culture NO GROWTH   Final    Report Status 07/11/2012 FINAL   Final   CULTURE, BLOOD (ROUTINE X 2)     Status: Normal (Preliminary result)   Collection Time   07/09/12  5:30 PM      Component Value Range Status Comment   Specimen Description BLOOD HAND LEFT   Final    Special Requests BOTTLES DRAWN AEROBIC AND ANAEROBIC 10CC   Final    Culture  Setup Time 07/09/2012 23:05   Final    Culture     Final    Value:        BLOOD CULTURE RECEIVED NO GROWTH TO DATE CULTURE WILL BE HELD FOR 5 DAYS BEFORE ISSUING A FINAL NEGATIVE REPORT   Report Status PENDING   Incomplete   CULTURE, BLOOD (ROUTINE X 2)     Status: Normal (Preliminary result)   Collection Time   07/09/12  5:50 PM      Component Value Range Status Comment   Specimen Description BLOOD HAND RIGHT   Final    Special Requests BOTTLES DRAWN AEROBIC AND ANAEROBIC 10CC   Final    Culture  Setup Time 07/09/2012 23:05   Final    Culture     Final    Value:        BLOOD CULTURE RECEIVED NO GROWTH TO DATE CULTURE WILL BE HELD FOR 5 DAYS BEFORE ISSUING A FINAL NEGATIVE REPORT   Report Status PENDING   Incomplete   MRSA PCR SCREENING     Status: Normal   Collection Time   07/09/12  7:00 PM      Component Value Range Status Comment   MRSA by PCR NEGATIVE  NEGATIVE Final    Studies/Results: Dg Chest Port 1 View  07/12/2012  *RADIOLOGY REPORT*  Clinical Data: COPD exacerbation  PORTABLE CHEST - 1 VIEW  Comparison: 07/11/2012  Findings: Hyperinflation/emphysematous changes.  Lungs otherwise clear.  No pleural effusion or pneumothorax.  The heart is top normal in size/mildly enlarged.  IMPRESSION: No evidence of acute cardiopulmonary disease.  Hyperinflation/emphysematous changes.  Original Report Authenticated By: Charline Bills, M.D.    Dg Chest Port 1 View  07/11/2012  *RADIOLOGY REPORT*  Clinical Data: COPD, ventilator dependent respiratory failure,  shortness of breath.  PORTABLE CHEST - 1 VIEW  Comparison: 07/10/2012.  Findings: Endotracheal tube has been removed in the interval. Nasogastric tube has also been removed.  Heart is at the upper limits of normal in size, stable.  Thoracic aorta is calcified. Minimal streaky opacification of the lung bases.  Lungs are otherwise clear.  No pleural fluid.  IMPRESSION: Minimal bibasilar atelectasis or scarring.   Original Report Authenticated By: Reyes Ivan, M.D.    Medications: Scheduled Meds:    . albuterol-ipratropium  2 puff Inhalation Q6H  . antiseptic oral rinse  15 mL Mouth Rinse QID  . aspirin EC  81 mg Oral Daily  . bisoprolol  5 mg Oral Daily  . chlorhexidine  15 mL Mouth Rinse BID  . heparin subcutaneous  5,000 Units Subcutaneous Q8H  . hydrochlorothiazide  25 mg Oral Daily  . levofloxacin  750 mg Oral Daily  . mirtazapine  15 mg Oral QHS  . pantoprazole  40 mg Oral Q1200  . predniSONE  60 mg Oral Q breakfast   Continuous Infusions:    . sodium chloride 20 mL/hr (07/10/12 2200)   PRN Meds:.albuterol, fentaNYL  Problem list: 1.Acute on chronic respiratory failure likely 2/2 acute on chronic COPD exacerbation 2. COPD (chronic obstructive pulmonary disease) 3. Right sided heart failure( per echo on 09/13) 4.  Normocytic Anemia 5. Depression 6. Financial assistance 7. Substance abuse  Assessment/Plan: Ms. 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 She was intubated on her arrival secondary to acute hypercarbic and hypoxic respiratory failure and was extubated on 07/10/2012. She was under the care of PCCM until transferred to IM teaching service for further management 07/11/12  1.Acute on chronic respiratory failure likely 2/2 acute on chronic COPD  exacerbation Likely secondary to acute exacerbation of COPD in the setting of ongoing tobacco abuse, noncompliance with medical appointments and medications, low income and not being able to afford medications. She also has a history of non-small same lung cancer status post radiation. Her ABG on admission 10/7 showed respiratory acidosis and was likely consistent with mixed hypoxic and hypercarbic respiratory failure. She was intubated on 10/7 and extubated on 10/ 8 . She also had encephalopathy this admission which is resolved.  Her chest x-ray with hyperinflated lung disease consistent with COPD with no focal infiltrates . She had mild leukocytosis to 11.9 on presentation.  07/2012 EF=60-65%.  CXR 10/10 showed hyperinflation/emphysematous changes.  Sputum cultures were not obtained. Blood cultures show no growth to date. She was empirically treated with broad-spectrum antibiotics( vanc and zosyn for 2 days) but never received the Vancomycin.  She was also on Solumedrol for 1.5 days this admission.  On 10/9 she was transitioned to Levaquin 750mg  qd and oral steroids Prednisone 60 mg x 2 days.    Plan -Respiratory status drastically improved, will d/c with 4 more days of Doxycycline and 10 day taper Prednisone starting at 40 mg.   -will refill all home medication (i.e Albuterol, Advair,Spiriva).  She used Combivent this admission   2. COPD (chronic obstructive pulmonary disease) see #1    3. Right sided  heart failure( per echo on 09/13) -However not seen on repeat echo 07/2012 Plan Continue ASA and Bisoprolol  Follow up [facility] outpatient   4.  Normocytic Anemia Patient is at her baseline 10.5-11.0 Anemia panel can be pursued outpatient not been checked since 2012   5. Depression Stable. Continue remeron.   6. Financial assistance -Case manager and social work consulted  7. Substance abuse -cocaine positive on admission but patient denies.  She has also been positive for marijuana in the  past.    8. DVT heparin 5000 K tid   LOS: 3 days   Annett Gula [phone] 07/12/2012, 2:19 PM

## 2012-07-12 NOTE — Progress Notes (Signed)
[facility]  Patient Status: Active (receiving services up to time of hospitalization)  [facility] is providing the following services: RN and SW  If patient discharges after hours, please call [phone].   Lanae Crumbly 07/12/2012, 2:21 PM

## 2012-07-12 NOTE — Clinical Social Work Psychosocial (Signed)
     Clinical Social Work Department BRIEF PSYCHOSOCIAL ASSESSMENT 07/12/2012  Patient:  Karina Weber, Karina Weber     Account Number:  [id number]     Admit date:  07/09/2012  Clinical Social Worker:  Margaree Mackintosh  Date/Time:  07/12/2012 12:00 M  Referred by:  Physician  Date Referred:  07/12/2012 Referred for  Other - See comment   Other Referral:   Housing.   Interview type:  Patient Other interview type:    PSYCHOSOCIAL DATA Living Status:  FRIEND(S) Admitted from facility:   Level of care:   Primary support name:  [place]:[phone] Primary support relationship to patient:  CHILD, ADULT Degree of support available:   Unknown.    CURRENT CONCERNS Current Concerns  Post-Acute Placement   Other Concerns:    SOCIAL WORK ASSESSMENT / PLAN Clinical Social Worker recieved referral indicating pt has questions related to housing.  CSW reviewed chart and met with pt at bedside.  CSW introduced self, explained role, and provided support.  Pt processed feelings related to housing situation; pt would "like a one bedroom, 1 bathroom, place of my own".  CSW provided list of low income housing for pt to review and phone facilities.  Pt thanked CSW for intervention.  No other concerns or needs identified at this time.  CSW to sign off, please re consult if needed.   Assessment/plan status:  Information/Referral to [facility] Other assessment/ plan:   Information/referral to community resources:   Low income housing list.    PATIENTS/FAMILYS RESPONSE TO PLAN OF CARE: Pt was engaged in conversation. Pt thanked CSW for intervention.

## 2012-07-12 NOTE — H&P (Signed)
Internal Medicine Teaching Program   Date of Admission:  07/09/2012     Principal Problem:  *Acute respiratory failure Active Problems:  Hypoxemia  COPD (chronic obstructive pulmonary disease)  Bronchitis      . albuterol-ipratropium  2 puff Inhalation Q6H  . antiseptic oral rinse  15 mL Mouth Rinse QID  . aspirin EC  81 mg Oral Daily  . bisoprolol  5 mg Oral Daily  . chlorhexidine  15 mL Mouth Rinse BID  . heparin subcutaneous  5,000 Units Subcutaneous Q8H  . hydrochlorothiazide  25 mg Oral Daily  . levofloxacin  750 mg Oral Daily  . mirtazapine  15 mg Oral QHS  . pantoprazole  40 mg Oral Q1200  . predniSONE  60 mg Oral Q breakfast  . DISCONTD: aspirin  81 mg Oral Daily  . DISCONTD: piperacillin-tazobactam (ZOSYN)  IV  3.375 g Intravenous Q8H  . DISCONTD: vancomycin  1,000 mg Intravenous Q12H    Subjective: I have seen and examined Karina Weber with Dr. Shirlee Latch. She was admitted with hypercarbic respiratory failure several days ago and required transient intubation. Due to financial stress recently she is been having difficulty paying rent, other bills and affording her medications. She states that she's been out of her medicines for one week prior to admission. She also continues to smoke cigarettes and her urine drug screen on admission was positive for cocaine.  Objective: Temp:  [98.1 F (36.7 C)-98.3 F (36.8 C)] 98.1 F (36.7 C) (10/10 0500) Pulse Rate:  [97-117] 97  (10/10 0934) Resp:  [24] 24  (10/09 1403) BP: (121-143)/(68-80) 121/76 mmHg (10/10 0934) SpO2:  [95 %-98 %] 97 % (10/10 0500) Weight:  [45.995 kg (101 lb 6.4 oz)] 45.995 kg (101 lb 6.4 oz) (10/10 0500)  General: Alert and comfortable talking in full senses. Skin: No rash Lungs: Bibasilar crackles Cor: Regular S1 and S2 no murmurs  Lab Results Lab Results  Component Value Date   WBC 8.5 07/12/2012   HGB 10.9* 07/12/2012   HCT 37.0 07/12/2012   MCV 85.5 07/12/2012   PLT 369 07/12/2012      Lab Results  Component Value Date   CREATININE 0.48* 07/12/2012   BUN 22 07/12/2012   NA 141 07/12/2012   K 3.5 07/12/2012   CL 96 07/12/2012   CO2 38* 07/12/2012    Lab Results  Component Value Date   ALT 6 07/12/2012   AST 13 07/12/2012   ALKPHOS 85 07/12/2012   BILITOT 0.2* 07/12/2012      Microbiology: Recent Results (from the past 240 hour(s))  URINE CULTURE     Status: Normal   Collection Time   07/09/12  3:35 PM      Component Value Range Status Comment   Specimen Description URINE, CATHETERIZED   Final    Special Requests ADDED 07/09/12 1951   Final    Culture  Setup Time 07/10/2012 03:16   Final    Colony Count NO GROWTH   Final    Culture NO GROWTH   Final    Report Status 07/11/2012 FINAL   Final   CULTURE, BLOOD (ROUTINE X 2)     Status: Normal (Preliminary result)   Collection Time   07/09/12  5:30 PM      Component Value Range Status Comment   Specimen Description BLOOD HAND LEFT   Final    Special Requests BOTTLES DRAWN AEROBIC AND ANAEROBIC 10CC   Final    Culture  Setup  Time 07/09/2012 23:05   Final    Culture     Final    Value:        BLOOD CULTURE RECEIVED NO GROWTH TO DATE CULTURE WILL BE HELD FOR 5 DAYS BEFORE ISSUING A FINAL NEGATIVE REPORT   Report Status PENDING   Incomplete   CULTURE, BLOOD (ROUTINE X 2)     Status: Normal (Preliminary result)   Collection Time   07/09/12  5:50 PM      Component Value Range Status Comment   Specimen Description BLOOD HAND RIGHT   Final    Special Requests BOTTLES DRAWN AEROBIC AND ANAEROBIC 10CC   Final    Culture  Setup Time 07/09/2012 23:05   Final    Culture     Final    Value:        BLOOD CULTURE RECEIVED NO GROWTH TO DATE CULTURE WILL BE HELD FOR 5 DAYS BEFORE ISSUING A FINAL NEGATIVE REPORT   Report Status PENDING   Incomplete   MRSA PCR SCREENING     Status: Normal   Collection Time   07/09/12  7:00 PM      Component Value Range Status Comment   MRSA by PCR NEGATIVE  NEGATIVE Final      Studies/Results: Dg Chest Port 1 View  07/12/2012  *RADIOLOGY REPORT*  Clinical Data: COPD exacerbation  PORTABLE CHEST - 1 VIEW  Comparison: 07/11/2012  Findings: Hyperinflation/emphysematous changes.  Lungs otherwise clear.  No pleural effusion or pneumothorax.  The heart is top normal in size/mildly enlarged.  IMPRESSION: No evidence of acute cardiopulmonary disease.  Hyperinflation/emphysematous changes.   Original Report Authenticated By: Charline Bills, M.D.    Dg Chest Port 1 View  07/11/2012  *RADIOLOGY REPORT*  Clinical Data: COPD, ventilator dependent respiratory failure, shortness of breath.  PORTABLE CHEST - 1 VIEW  Comparison: 07/10/2012.  Findings: Endotracheal tube has been removed in the interval. Nasogastric tube has also been removed.  Heart is at the upper limits of normal in size, stable.  Thoracic aorta is calcified. Minimal streaky opacification of the lung bases.  Lungs are otherwise clear.  No pleural fluid.  IMPRESSION: Minimal bibasilar atelectasis or scarring.   Original Report Authenticated By: Reyes Ivan, M.D.     Assessment: She has severe acute exacerbation of her chronic bronchitis causing ventilator dependent respiratory failure transiently. This was probably multifactorial and triggered by being off of her medications, continuing to smoke and using cocaine. I see in her records that there was an attempted last months to connect her with [facility] case management. We will check and see if that ever happened and try to get her some more social support. We will continue a brief course of antibiotics and a prednisone taper and try to get her back on her home medications.   Plan: 1. I agree with current management plans 2. Contact [facility] care management  Cliffton Asters, MD Internal Medicine Teaching Program [facility] [phone] pager   [phone] cell 07/12/2012, 12:49 PM

## 2012-07-14 ENCOUNTER — Encounter (HOSPITAL_COMMUNITY): Payer: Self-pay | Admitting: Internal Medicine

## 2012-07-14 NOTE — Discharge Summary (Signed)
Internal Medicine Teaching [facility] Discharge Note  Name: Karina Weber MRN: [id number] DOB: 1956-02-22 56 y.o.  Date of Admission: 07/09/2012  2:01 PM Date of Discharge: 07/12/2012 Attending: Dr. Cliffton Asters  Discharge Diagnosis: 1. 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 Hypertension 3. Tobacco abuse/Substance abuse (cocaine, history of THC) 4. Normocytic anemia 5. Depression 6. Financial, social stressors  Discharge Medications:   Medication List     As of 07/14/2012  9:00 PM    STOP taking these medications         hydrochlorothiazide 25 MG tablet   Commonly known as: HYDRODIURIL      TAKE these medications         albuterol 108 (90 BASE) MCG/ACT inhaler   Commonly known as: PROVENTIL HFA;VENTOLIN HFA   Inhale 2 puffs into the lungs every 4 (four) hours as needed for wheezing or shortness of breath.      aspirin 81 MG tablet   Take 1 tablet (81 mg total) by mouth daily.      bisoprolol 5 MG tablet   Commonly known as: ZEBETA   Take 1 tablet (5 mg total) by mouth daily.      doxycycline 50 MG capsule   Commonly known as: VIBRAMYCIN   Take 2 capsules (100 mg total) by mouth 2 (two) times daily.      Fluticasone-Salmeterol 500-50 MCG/DOSE Aepb   Commonly known as: ADVAIR   Inhale 1 puff into the lungs every 12 (twelve) hours.      mirtazapine 15 MG tablet   Commonly known as: REMERON   Take 1 tablet (15 mg total) by mouth at bedtime.      predniSONE 10 MG tablet   Commonly known as: DELTASONE   Take 1 tablet (10 mg total) by mouth daily.      tiotropium 18 MCG inhalation capsule   Commonly known as: SPIRIVA   Place 1 capsule (18 mcg total) into inhaler and inhale daily.        Disposition and follow-up:   [name] was discharged from [facility] in good condition.  At the hospital  follow up visit please address  1) BMET, anemia work up (i.e anemia panel) 2) Tobacco/substance abuse 3) Refer to Lynnae January for needed resources 4) Emphasize compliance with Pulmonary outpatient follow up.  They ordered alpha 1 antitrysin to follow up.     Follow-up Appointments:      Discharge Orders    Future Appointments: Provider: Department: Dept Phone: Center:   07/20/2012 11:30 AM Julio Sicks, NP [facility] [phone] None   07/23/2012 3:30 PM Linward Headland, MD [facility] [phone] [facility]     Future Orders Please Complete By Expires   Home Health      Questions: Responses:   To provide the following care/treatments RN    Social work   Actuary      Comments:   Henry Russel N certify that this patient is under my care and that I, or a nurse practitioner or physician's assistant working with me, had a face-to-face encounter that meets the physician face-to-face encounter requirements with this patient on 07/12/2012.  Lack of transportation and finances   Questions: Responses:   The encounter with the patient was in whole, or in part, for the following medical condition, which is the primary reason for home health care  COPD exacerbation with multiple admissions   I certify that, based on my findings, the following services are medically necessary home health services Nursing   My clinical findings support the need for the above services Acute exacerbation of COPD   Further, I certify that my clinical findings support that this patient is homebound due to: Unable to leave home safely without assistance   To provide the following care/treatments RN    Social work   Increase activity slowly      Discharge instructions      Comments:   1) Please call [facility] and make sure they deliver your medications and take as instructed 2) Pulmonary appointment 07/20/12 at 11:30 am [phone] 3)Internal Medicine appointment 07/23/12 at 3:30  with Dr. Manson Passey [phone] 4) It is important that you go to your doctors appointments, stop smoking/drugs and take your medications as instructed.      Consultations: MICU: Dr. Molli Knock Social Work: Leonor Liv Case Management: Mable Fill  Procedures Performed:  Dg Chest Port 1 View  07/12/2012  *RADIOLOGY REPORT*  Clinical Data: COPD exacerbation  PORTABLE CHEST - 1 VIEW  Comparison: 07/11/2012  Findings: Hyperinflation/emphysematous changes.  Lungs otherwise clear.  No pleural effusion or pneumothorax.  The heart is top normal in size/mildly enlarged.  IMPRESSION: No evidence of acute cardiopulmonary disease.  Hyperinflation/emphysematous changes.   Original Report Authenticated By: Charline Bills, M.D.    Dg Chest Port 1 View  07/11/2012  *RADIOLOGY REPORT*  Clinical Data: COPD, ventilator dependent respiratory failure, shortness of breath.  PORTABLE CHEST - 1 VIEW  Comparison: 07/10/2012.  Findings: Endotracheal tube has been removed in the interval. Nasogastric tube has also been removed.  Heart is at the upper limits of normal in size, stable.  Thoracic aorta is calcified. Minimal streaky opacification of the lung bases.  Lungs are otherwise clear.  No pleural fluid.  IMPRESSION: Minimal bibasilar atelectasis or scarring.   Original Report Authenticated By: Reyes Ivan, M.D.    Dg Chest Port 1 View  07/10/2012  *RADIOLOGY REPORT*  Clinical Data: Evaluate endotracheal tube position.  COPD.  Lung cancer.  PORTABLE CHEST - 1 VIEW  Comparison: 1 day prior  Findings: Endotracheal tube terminates 2.6 cm above carina. Nasogastric extends beyond the  inferior aspect of the film. Numerous leads and wires project over the chest.  Patient rotated to the left.  Mild cardiomegaly. No pleural effusion or pneumothorax.  Similar mild interstitial thickening.  No overt congestive failure. No lobar consolidation.  IMPRESSION:  1. No significant change since one day prior. 2.  Similar mild  interstitial thickening.  Favored to be related to chronic bronchitis/smoking.  Mild pulmonary venous congestion could look similar.   Original Report Authenticated By: Consuello Bossier, M.D.    Dg Chest Port 1 View  07/09/2012  *RADIOLOGY REPORT*  Clinical Data: Shortness of breath.  COPD.  PORTABLE CHEST - 1 VIEW  Comparison: One-view chest 07/09/2012 at 02:27 p.m.  Findings: The patient has been intubated.  Endotracheal tube terminates 3.6 cm above the carina.  An NG tube courses off the inferior border of the film.  The heart size is normal. Mild pulmonary vascular congestion is stable.  IMPRESSION:  1.  Interval intubation and placement of NG tube. 2.  The support apparatus is in satisfactory position without radiographic evidence for complication. 3.  Stable pulmonary vascular congestion.   Original Report Authenticated By: Jamesetta Orleans. MATTERN, M.D.    Dg Chest Portable 1 View  07/09/2012  *  RADIOLOGY REPORT*  Clinical Data: Respiratory distress.  PORTABLE CHEST - 1 VIEW  Comparison: 06/04/2012  Findings: Mild cardiomegaly.  Mediastinal and hilar contours are normal.  Slight interstitial thickening likely due to congestion. No focal airspace abnormalities.  No effusions.  No bony abnormalities.  IMPRESSION: Cardiomegaly and stable chronic vascular congestion without focal abnormalities.   Original Report Authenticated By: Mervin Hack, M.D.    Dg Chest Port 1v Same Day  07/09/2012  *RADIOLOGY REPORT*  Clinical Data: Intubation  PORTABLE CHEST - 1 VIEW SAME DAY  Comparison: Earlier same day; 06/04/2012  Findings:  Grossly unchanged enlarged cardiac silhouette and mediastinal contours.  Endotracheal tube overlies tracheal air column with tip superior to the carina.  Enteric tube terminates inferior to left hemidiaphragm.  Lung volumes remain hyperexpanded.  Pulmonary vasculature is indistinct with cephalization of flow.  Grossly unchanged perihilar opacities.  No new focal airspace opacities. No  definite pleural effusion or pneumothorax.  Unchanged bones.  IMPRESSION: 1.  1.  Stable positioning of support apparatus.  No pneumothorax.  2. Grossly stable mild port is just without frank evidence of edema. 3.  Perihilar opacities favored to represent atelectasis.   Original Report Authenticated By: Waynard Reeds, M.D.      Admission HPI:  56 year old woman with past medical history significant for of hypertension, COPD on 2 litres of home O2 with history of multiple exacerbations, right upper lobe non-small cell CA s/p radiation 2011, with recent admit 9/2-9//5 for acute on chronic respiratory failure, right sided heart failure and was scheduled to follow up in the pulmonary office on 07/06/12 but was a no show.  She presented to [facility] on 10/7 via EMS with complaints progressive shortness of breath and cough requiring intubation.  Patient reports that she has off and on difficulty breathing and was in her usual state of health until the morning of her admission when she woke up and couldn't catch her breath. She had a coughing spell and the next thing that she remembers was being in the EMS . " I was not thinking right".  EMS reported on scene that she was hypoxic with sats in 50's, distressed and combative. In ER she was placed on BiPap but became more somnolent and required intubation 07/09/12 in the ICU. ABG demonstrated hypercarbic respiratory failure.  She was extubated 10/8 and transferred to the Internal Medicine teaching service for further care 07/11/12.  She also reports that she has been undergoing a lot of stress lately with finances. She is looking for a new place to live (currently living with a mutual grandmother to their grandchildren) and her food stamps have been cut down. She was requesting assistance.  She gets $600/month and after bills has $30/month.    07/09/12 MICU admission physical exam/vitals Temp: [98.8 F (37.1 C)] 98.8 F (37.1 C) (10/07 1407)  Pulse Rate:  [105-143] 107 (10/07 1630)  Resp: [14-36] 14 (10/07 1630)  BP: (104-205)/(60-139) 104/60 mmHg (10/07 1630)  SpO2: [94 %-100 %] 100 % (10/07 1630)  FiO2 (%): [60 %-80 %] 80 % (10/07 1630)  Weight: [168 lb 3.4 oz (76.3 kg)] 168 lb 3.4 oz (76.3 kg) (10/07 1630)  Physical Examination:  General: Chronically ill in NAD  Neuro: sedate  HEENT: Mm pink/moist, OETT  Cardiovascular: s1s2 rrr,no m/r/g  Lungs: resp's even/non-labored on vent, lungs bilaterally with few sibilant wheezes  Abdomen: Round/soft, bsx4 active  Musculoskeletal: No acute deformities  Skin: Warm/dry, no edema, scattered lower  extremity scars   07/11/12 Internal Medicine teaching service transfer physical exam/vitals Physical Exam: Blood pressure 121/61, pulse 110, temperature 98.7 F (37.1 C), temperature source Oral, resp. rate 25, height 5\' 4"  (1.626 m), weight 102 lb 4.7 oz (46.4 kg), SpO2 94.00%.  BP 121/61  Pulse 110  Temp 98.7 F (37.1 C) (Oral)  Resp 25  Ht 5\' 4"  (1.626 m)  Wt 102 lb 4.7 oz (46.4 kg)  BMI 17.56 kg/m2  SpO2 94%  General Appearance:  Alert, cooperative, mild respiratory distress, sitting in the chair   Head:  Normocephalic, without obvious abnormality, atraumatic   Eyes:  PERRL, conjunctiva/corneas clear, EOM's intact, fundi  benign, both eyes   Ears:  Normal TM's and external ear canals, both ears   Nose:  Nares normal, septum midline, mucosa normal, no drainage or sinus tenderness   Throat:  Lips, mucosa, and tongue normal; poor dentition   Neck:  Supple, symmetrical, trachea midline, no adenopathy;  thyroid: no enlargement/tenderness/nodules; no carotid  bruit or JVD   Back:  Symmetric, no curvature, ROM normal, no CVA tenderness   Lungs:  Coarse breath sounds bilaterally - rhonchi and crackles present all over( right worse than left)   Chest Wall:  No tenderness or deformity   Heart:  Regular rate and rhythm, S1 and S2 normal, no murmur, rub or gallop   Breast Exam:  No tenderness,  masses, or nipple abnormality   Abdomen:  Soft, non-tender, bowel sounds active all four quadrants,  no masses, no organomegaly   Genitalia:  Normal female without lesion, discharge or tenderness   Rectal:  Normal tone, normal prostate, no masses or tenderness;  guaiac negative stool   Extremities:  Extremities normal, atraumatic, no cyanosis or edema   Pulses:  2+ and symmetric all extremities   Skin:  Skin color, texture, turgor normal, no rashes or lesions   Lymph nodes:  Cervical, supraclavicular, and axillary nodes normal   Neurologic:  CNII-XII intact, normal strength, sensation and reflexes  throughout     Hospital Course by problem list: 1. 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 She also has no transportation and was unaware her pharmacy [facility] would deliver her medications or that she needed to call a physician when she no longer had refills. Unfortunately, for these reasons she has been out of her medications.  She also has a history of non-small same lung cancer status post radiation. Her ABG on admission 10/7 showed respiratory acidosis and was likely consistent with mixed hypoxic and hypercarbic respiratory failure. She was intubated on 10/7 and extubated on 10/ 8 . She also had encephalopathy this admission which resolved. Her chest x-ray showed hyperinflated lung disease consistent with COPD with no focal infiltrates . She had mild leukocytosis to 11.9 on presentation. 07/2012 echo with EF=60-65%. CXR 10/10 showed hyperinflation/emphysematous changes. Sputum cultures were not obtained. Blood cultures showed no growth to date. She was empirically  treated with broad-spectrum antibiotics( vanc and zosyn for 2 days) but never received the Vancomycin. She was also on Solumedrol for 1.5 days this admission. On 10/9 she was transitioned to Levaquin 750mg  qd and oral steroids Prednisone 60 mg x 2 days.  She also was given a Combivent inhaler this admission.  Her respiratory status drastically improved.  She was discharge with 4 more days of Doxycycline and 10 day taper Prednisone starting at 50 mg.  We refilled all home medication (i.e Albuterol, Advair,Spiriva).  2. Hypertension She has a history of hypertension and last admission was discontinued off hydrochlorothiazide.  She will be continued on ASA and Bisoprolol.  She was started on Bisoprolol last admission when it was noted that she had right sided heart failure 06/2012 however it was not noted on repeat echo 07/2012.   3. Tobacco abuse/Substance abuse Currently smoking 1 ppd and cocaine positive on admission but patient denies. She has also been positive for marijuana in the past.  Smoking cessation provided this admission  4. Normocytic anemia Patient is at her baseline 10.5-11.0.  Anemia panel can be pursued outpatient it has not been checked since 2012.   5. Depression Without active suicidal ideation.  Continue Remeron  6. Financial, social stressors See. HPI (i.e food stamps reduced, does not like living situation, low socioeconomic status).  Case manager and social work were consulted this admission for resources.   She was on Heparin 5000 units three times a day this admission for DVT prophylaxis.       Discharge Vitals:  BP 114/71  Pulse 85  Temp 99 F (37.2 C) (Oral)  Resp 24  Ht 5\' 4"  (1.626 m)  Wt 101 lb 6.4 oz (45.995 kg)  BMI 17.41 kg/m2  SpO2 97%  Discharge Physical Exam: General: lying in bed eating, nad, alert and oriented x 3  HEENT: [place]/at, nl scleral icterus  CV: RRR no r/m/g s1/s2 noted  Lungs: min. crackles b/l  Ab: soft, ntnd, +bs  Extremities: warm,  no cyanosis/edema  Neuro: CN 2-12 grossly intact, moving all 4 extremities   Discharge Labs:  Results for Karina, Weber (MRN [id number]) as of 07/14/2012 21:02  Ref. Range 07/09/2012 14:34 07/09/2012 17:37  Sample type No range found ARTERIAL ARTERIAL  pH, Arterial Latest Range: 7.350-7.450  7.087 (LL) 7.259 (L)  pCO2 arterial Latest Range: 35.0-45.0 mmHg 124.3 (HH) 73.4 (HH)  pO2, Arterial Latest Range: 80.0-100.0 mmHg 107.0 (H) 254.0 (H)  Bicarbonate Latest Range: 20.0-24.0 mEq/L 37.4 (H) 32.9 (H)  TCO2 Latest Range: 0-100 mmol/L 41 35  Acid-Base Excess Latest Range: 0.0-2.0 mmol/L 3.0 (H) 4.0 (H)  O2 Saturation No range found 94.0 100.0  Patient temperature No range found 98.8 F 98.6 F  Collection site No range found RADIAL, ALLEN'S TEST ACCEPTABLE RADIAL, ALLEN'S TEST ACCEPTABLE   Results for Karina, Weber (MRN [id number]) as of 07/14/2012 21:02  Ref. Range 07/09/2012 14:25 07/09/2012 14:35 07/09/2012 17:08 07/09/2012 17:46 07/09/2012 17:50 07/10/2012 04:10 07/11/2012 04:40 07/12/2012 06:50  Sodium Latest Range: 135-145 mEq/L 138    141 141 139 141  Potassium Latest Range: 3.5-5.1 mEq/L 4.3    4.3 3.9 3.8 3.5  Chloride Latest Range: 96-112 mEq/L 98    100 102 97 96  CO2 Latest Range: 19-32 mEq/L 29    28 30  32 38 (H)  BUN Latest Range: 6-23 mg/dL 16    16 11 14 22   Creatinine Latest Range: 0.50-1.10 mg/dL 7.82    9.56 2.13 (L) 0.86 (L) 0.48 (L)  Calcium Latest Range: 8.4-10.5 mg/dL 9.9    57.8 9.3 9.5 9.4  GFR calc non Af Amer Latest Range: >90 mL/min >90    >90 >90 >90 >90  GFR calc Af Amer Latest Range: >90 mL/min >90    >90 >90 >90 >90  Glucose Latest  Range: 70-99 mg/dL 409 (H)    811 (H) 914 (H) 106 (H) 102 (H)  Phosphorus Latest Range: 2.3-4.6 mg/dL     4.7 (H) 2.5    Magnesium Latest Range: 1.5-2.5 mg/dL 1.7    1.8 1.8    Alkaline Phosphatase Latest Range: 39-117 U/L 147 (H)    145 (H)  100 85  Albumin Latest Range: 3.5-5.2 g/dL 3.6    3.6  2.9 (L) 2.7 (L)  AST Latest Range:  0-37 U/L 12    14  10 13   ALT Latest Range: 0-35 U/L 8    8  6 6   Total Protein Latest Range: 6.0-8.3 g/dL 8.0    8.2  6.6 6.0  Total Bilirubin Latest Range: 0.3-1.2 mg/dL 0.2 (L)    0.2 (L)  0.2 (L) 0.2 (L)  Troponin i, poc Latest Range: 0.00-0.08 ng/mL  0.00        Pro B Natriuretic peptide (BNP) Latest Range: 0-125 pg/mL     1380.0 (H)     Lactic Acid, Venous Latest Range: 0.5-2.2 mmol/L   0.9       Procalcitonin No range found    0.25  1.16 0.75   ALPHA-1 ANTITRYPSIN PHENOTYPE No range found       Rpt    Results for Karina, Weber (MRN [id number]) as of 07/14/2012 21:02  Ref. Range 07/09/2012 14:25 07/09/2012 17:50 07/10/2012 04:10 07/11/2012 04:40 07/12/2012 06:50  WBC Latest Range: 4.0-10.5 K/uL 11.9 (H) 10.3 9.3 10.0 8.5  RBC Latest Range: 3.87-5.11 MIL/uL 4.71 4.08 3.99 4.02 4.33  Hemoglobin Latest Range: 12.0-15.0 g/dL 08.6 57.8 (L) 46.9 (L) 10.3 (L) 10.9 (L)  HCT Latest Range: 36.0-46.0 % 41.7 36.1 33.7 (L) 34.2 (L) 37.0  MCV Latest Range: 78.0-100.0 fL 88.5 88.5 84.5 85.1 85.5  MCH Latest Range: 26.0-34.0 pg 25.9 (L) 25.5 (L) 25.1 (L) 25.6 (L) 25.2 (L)  MCHC Latest Range: 30.0-36.0 g/dL 62.9 (L) 52.8 (L) 41.3 (L) 30.1 29.5 (L)  RDW Latest Range: 11.5-15.5 % 22.0 (H) 22.0 (H) 22.1 (H) 22.4 (H) 21.9 (H)  Platelets Latest Range: 150-400 K/uL 320 266 294 340 369  Neutrophils Relative Latest Range: 43-77 % 68 96 (H)     Lymphocytes Relative Latest Range: 12-46 % 18 2 (L)     Monocytes Relative Latest Range: 3-12 % 13 (H) 2 (L)     Eosinophils Relative Latest Range: 0-5 % 0 0     Basophils Relative Latest Range: 0-1 % 0 0     NEUT# Latest Range: 1.7-7.7 K/uL 8.1 (H) 9.9 (H)     Lymphocytes Absolute Latest Range: 0.7-4.0 K/uL 2.2 0.2 (L)     Monocytes Absolute Latest Range: 0.1-1.0 K/uL 1.6 (H) 0.2     Eosinophils Absolute Latest Range: 0.0-0.7 K/uL 0.0 0.0     Basophils Absolute Latest Range: 0.0-0.1 K/uL 0.0 0.0     RBC Morphology No range found  POLYCHROMASIA PRESENT      Results  for Karina, Weber (MRN [id number]) as of 07/14/2012 21:02  Ref. Range 07/09/2012 17:50  Cortisol, Plasma No range found 40.5   Results for Karina, Weber (MRN [id number]) as of 07/14/2012 21:02  Ref. Range 07/09/2012 15:35 07/09/2012 15:36  Color, Urine Latest Range: YELLOW   YELLOW  APPearance Latest Range: CLEAR   CLEAR  Specific Gravity, Urine Latest Range: 1.005-1.030   1.021  pH Latest Range: 5.0-8.0   6.0  Glucose Latest Range: NEGATIVE mg/dL  NEGATIVE  Bilirubin Urine Latest Range: NEGATIVE  NEGATIVE  Ketones, ur Latest Range: NEGATIVE mg/dL  NEGATIVE  Protein Latest Range: NEGATIVE mg/dL  >578 (A)  Urobilinogen, UA Latest Range: 0.0-1.0 mg/dL  0.2  Nitrite Latest Range: NEGATIVE   NEGATIVE  Leukocytes, UA Latest Range: NEGATIVE   NEGATIVE  Hgb urine dipstick Latest Range: NEGATIVE   TRACE (A)  WBC, UA Latest Range: <3 WBC/hpf  0-2  RBC / HPF Latest Range: <3 RBC/hpf  3-6  Squamous Epithelial / LPF Latest Range: RARE   RARE  Bacteria, UA Latest Range: RARE   RARE  Creatinine,U No range found 88.2    Results for Weber, Karina A (MRN [id number]) as of 07/14/2012 21:02  Ref. Range 07/09/2012 15:35  Benzoylecgonine GC/MS Conf Latest Range: Cutoff:50 ng/mL 447 (H)  DRUGS OF ABUSE SCREEN W ALC, ROUTINE URINE No range found Rpt (A)  Amphetamine Screen, Ur Latest Range: Negative  NEGATIVE  Barbiturate Quant, Ur Latest Range: Negative  NEGATIVE  Opiate Screen, Urine Latest Range: Negative  NEGATIVE  Benzodiazepines. Latest Range: Negative  NEGATIVE  Cocaine Metabolites Latest Range: Negative  POSITIVE (A)  Marijuana Metabolite Latest Range: Negative  NEGATIVE  Phencyclidine (PCP) Latest Range: Negative  NEGATIVE  Methadone Latest Range: Negative  NEGATIVE  Propoxyphene Latest Range: Negative  NEGATIVE  Ethyl Alcohol Latest Range: <10 mg/dL <41   32/4/40 Urine and Blood cultures negative   Signed: Annett Gula 07/14/2012, 5:00 PM   Time Spent on Discharge: <30 min

## 2012-07-15 LAB — CULTURE, BLOOD (ROUTINE X 2): Culture: NO GROWTH

## 2012-07-19 NOTE — Telephone Encounter (Signed)
Pt scheduled for HFU w/ TP 10.18.13.  Will sign off on message.

## 2012-07-20 ENCOUNTER — Encounter: Payer: Self-pay | Admitting: Adult Health

## 2012-07-20 ENCOUNTER — Ambulatory Visit (INDEPENDENT_AMBULATORY_CARE_PROVIDER_SITE_OTHER): Payer: Medicare Other | Admitting: Adult Health

## 2012-07-20 VITALS — BP 124/66 | HR 77 | Temp 97.4°F | Ht 60.0 in | Wt 104.4 lb

## 2012-07-20 DIAGNOSIS — Z23 Encounter for immunization: Secondary | ICD-10-CM

## 2012-07-20 DIAGNOSIS — J449 Chronic obstructive pulmonary disease, unspecified: Secondary | ICD-10-CM

## 2012-07-20 LAB — ALPHA-1 ANTITRYPSIN PHENOTYPE: A-1 Antitrypsin: 175 mg/dL (ref 83–199)

## 2012-07-20 NOTE — Assessment & Plan Note (Signed)
Recurrent exacerbation with acute on chronic hypercarbic and hypoxemic respiratory failure with recurrent admission secondary to multiple issues, including medication noncompliance. Low social economic issues, lack of transportation, and ongoing polysubstance abuse  Today. We focused on patient education. For her inhalers, along with smoking cessation. Encouraged her on continuous oxygen use, advised on the dangers on oxygen, and smoking She'll return back to office in 2-3 weeks for followup. We will try to do a spirometry test on return She will maintain on prednisone 10 mg daily until followup. If continues to do well. We'll look at possibly decreasing her steroid use

## 2012-07-20 NOTE — Progress Notes (Signed)
Subjective:    Patient ID: Karina Weber, female    DOB: 26-Apr-1956, 56 y.o.   MRN: [id number]  HPI 56 year old, African American female with a known history of polysubstance abuse, COPD, right upper lobe non-small cell carcinoma, status post radiation treatment 2011, seen for initial pulmonary and critical care consult in September 2013 for acute respiratory failure and then again on 07/09/2012 for readmission for acute hypoxemic and hypercarbic respiratory failure secondary to COPD, exacerbation, and continued polysubstance abuse  07/20/2012 Post Hospital follow up  Patient was readmitted October 7 through 07/12/2012 for acute hypoxemic and hypercarbic respiratory failure secondary to recurrent COPD, exacerbation, and polysubstance abuse Patient had been recently admitted September 2  September 5, for acute on chronic her story failure, right-sided heart failure. She was scheduled for outpatient followup at the, pulmonary clinic, however, she missed his followup and was readmitted on October 7. She did require brief intubation. She was successfully extubated and treated with aggressive pulmonary hygiene regimen . Along with IV steroids. She did have an inpatient social work consult. That is trying to help her with her finances and living situation She does have Medicaid and her medications were set up for pharmacy delivery to her home. She was also set up with a outpatient transportation system to try to get her to her medical appointments  Since discharge. Patient reports that she is feeling improved with decreased cough, congestion, and shortness of breath. She is currently on prednisone 10 mg daily, along with Spiriva and Advair. Today in the office whenever the inhaler use. The patient instruction and demonstration in the office visit. Discussed smoking cessation. Also encouraged her to wear her oxygen, which is supposed to be continuous as her O2 sats were low on to the office today. She was  [facility] O2 tank from [facility] to take home with her until she can get home and use. Her own concentrator She is not ready to quit smoking however, does say that she will try to cut down to half a pack a day  She denies any hemoptysis, orthopnea, PND, or leg swelling  Review of Systems Constitutional:   No  weight loss, night sweats,  Fevers, chills,  +fatigue, or  lassitude.  HEENT:   No headaches,  Difficulty swallowing,  Tooth/dental problems, or  Sore throat,                No sneezing, itching, ear ache, nasal congestion, post nasal drip,   CV:  No chest pain,  Orthopnea, PND, swelling in lower extremities, anasarca, dizziness, palpitations, syncope.   GI  No heartburn, indigestion, abdominal pain, nausea, vomiting, diarrhea, change in bowel habits, loss of appetite, bloody stools.   Resp:    No coughing up of blood.    No chest wall deformity  Skin: no rash or lesions.  GU: no dysuria, change in color of urine, no urgency or frequency.  No flank pain, no hematuria   MS:  No joint pain or swelling.  No decreased range of motion.  No back pain.  Psych:  No change in mood or affect. No depression or anxiety.  No memory loss.         Objective:   Physical Exam GEN: A/Ox3; pleasant , NAD, thin /frail    HEENT:  [place]/AT,  EACs-clear, TMs-wnl, NOSE-clear, THROAT-clear, no lesions, no postnasal drip or exudate noted.   NECK:  Supple w/ fair ROM; no JVD; normal carotid impulses w/o bruits; no thyromegaly or  nodules palpated; no lymphadenopathy.  RESP  Coarse BS w/o, wheezes/ rales/ or rhonchi.no accessory muscle use, no dullness to percussion  CARD:  RRR, no m/r/g  , no peripheral edema, pulses intact, no cyanosis or clubbing.  GI:   Soft & nt; nml bowel sounds; no organomegaly or masses detected.  Musco: Warm bil, no deformities or joint swelling noted.   Neuro: alert, no focal deficits noted.    Skin: Warm, no lesions or rashes         Assessment & Plan:

## 2012-07-20 NOTE — Patient Instructions (Addendum)
Flu shot today  Continue on Advair 500/54mcg 1 puff Twice daily  -brush/rinse and gargle  Continue on Spiriva 1 puff daily  Continue on prednisone 10mg  daily  Work on stopping smoking- at least cut in half -please- You are able to do this  Follow up in 2 -3 w/ first available pulmonary doctor.  Wear Oxygen 2L/M at all times.

## 2012-07-23 ENCOUNTER — Encounter: Payer: Self-pay | Admitting: Internal Medicine

## 2012-07-23 ENCOUNTER — Ambulatory Visit (INDEPENDENT_AMBULATORY_CARE_PROVIDER_SITE_OTHER): Payer: Medicare Other | Admitting: Internal Medicine

## 2012-07-23 VITALS — BP 149/85 | HR 86 | Temp 98.7°F | Ht 60.0 in | Wt 106.8 lb

## 2012-07-23 DIAGNOSIS — Z1239 Encounter for other screening for malignant neoplasm of breast: Secondary | ICD-10-CM

## 2012-07-23 DIAGNOSIS — F17200 Nicotine dependence, unspecified, uncomplicated: Secondary | ICD-10-CM

## 2012-07-23 DIAGNOSIS — Z0000 Encounter for general adult medical examination without abnormal findings: Secondary | ICD-10-CM

## 2012-07-23 DIAGNOSIS — J449 Chronic obstructive pulmonary disease, unspecified: Secondary | ICD-10-CM

## 2012-07-23 DIAGNOSIS — R519 Headache, unspecified: Secondary | ICD-10-CM | POA: Insufficient documentation

## 2012-07-23 DIAGNOSIS — R195 Other fecal abnormalities: Secondary | ICD-10-CM

## 2012-07-23 DIAGNOSIS — R51 Headache: Secondary | ICD-10-CM

## 2012-07-23 DIAGNOSIS — I10 Essential (primary) hypertension: Secondary | ICD-10-CM

## 2012-07-23 DIAGNOSIS — D649 Anemia, unspecified: Secondary | ICD-10-CM

## 2012-07-23 MED ORDER — NICOTINE 21 MG/24HR TD PT24: 1.0000 | MEDICATED_PATCH | TRANSDERMAL | Status: DC

## 2012-07-23 MED ORDER — ALBUTEROL SULFATE HFA 108 (90 BASE) MCG/ACT IN AERS: 2.0000 | INHALATION_SPRAY | RESPIRATORY_TRACT | Status: DC | PRN

## 2012-07-23 MED ORDER — BISOPROLOL FUMARATE 10 MG PO TABS: 10.0000 mg | ORAL_TABLET | Freq: Every day | ORAL | Status: DC

## 2012-07-23 MED ORDER — SUMATRIPTAN SUCCINATE 50 MG PO TABS: 50.0000 mg | ORAL_TABLET | Freq: Once | ORAL | Status: DC | PRN

## 2012-07-23 NOTE — Assessment & Plan Note (Signed)
The patient notes a 2-week history of daily headaches, with symptoms of nausea, photophobia, and an "aura" consisting of a pine-cone smell and visual spots.  Symptoms appear consistent with migraine with aura, though it's unusual that these symptoms have just started now with no history of prior migraine. -start sumatriptan for migraine, assess response -if unable to afford, can try phenergan instead

## 2012-07-23 NOTE — Progress Notes (Signed)
HPI The patient is a 56 y.o. yo female with a history of HTN, COPD, tobacco use, presenting for a hospital follow-up.  The patient was hospitalized 10/7-10/10 for a COPD exacerbation.  The patient notes that her breathing has been "good" since leaving the hospital.  The patient misunderstood her prednisone taper instructions on hospital discharge, and transitioned from 60 mg x2 days during her hospitalization to 10 mg/day at hospital discharge, which the patient has taken since that time.  The patient notes a few-week history of headaches, described as a posterior "pounding" pain, occurring daily, occasionally associated with nausea and mild photophobia, but no vomiting.  These symptoms are sometimes preceded by a "pine-cone smell" and visual spots, which resolve once the headache starts.  She has no history of similar headaches in the past.  The patient's BP is somewhat elevated today.  ROS: General: no fevers, chills, changes in weight, changes in appetite Skin: no rash HEENT: see HPI Pulm: no dyspnea, coughing, wheezing CV: no chest pain, palpitations, shortness of breath Abd: no abdominal pain, nausea/vomiting, diarrhea/constipation GU: no dysuria, hematuria, polyuria Ext: no arthralgias, myalgias Neuro: no weakness, numbness, or tingling  Filed Vitals:   07/23/12 1539  BP: 149/85  Pulse: 86  Temp: 98.7 F (37.1 C)    PEX General: alert, cooperative, and in no apparent distress HEENT: pupils equal round and reactive to light, vision grossly intact, oropharynx clear and non-erythematous  Neck: supple, no lymphadenopathy Lungs: clear to ascultation bilaterally, normal work of respiration, no wheezes, rales, ronchi Heart: regular rate and rhythm, no murmurs, gallops, or rubs Abdomen: soft, non-tender, non-distended, normal bowel sounds Extremities: no cyanosis, clubbing, or edema Neurologic: alert & oriented X3, cranial nerves II-XII intact, strength 5/5 throughout, sensation  intact to light touch throughout, reflexes 2+ throughout  Current Outpatient Prescriptions on File Prior to Visit  Medication Sig Dispense Refill  . albuterol (VENTOLIN HFA) 108 (90 BASE) MCG/ACT inhaler Inhale 2 puffs into the lungs every 4 (four) hours as needed for wheezing or shortness of breath.  18 g  6  . aspirin 81 MG tablet Take 1 tablet (81 mg total) by mouth daily.  30 tablet  3  . bisoprolol (ZEBETA) 5 MG tablet Take 1 tablet (5 mg total) by mouth daily.  30 tablet  3  . Fluticasone-Salmeterol (ADVAIR) 500-50 MCG/DOSE AEPB Inhale 1 puff into the lungs every 12 (twelve) hours.  60 each  3  . mirtazapine (REMERON) 15 MG tablet Take 1 tablet (15 mg total) by mouth at bedtime.  30 tablet  6  . predniSONE (DELTASONE) 10 MG tablet Take 1 tablet (10 mg total) by mouth daily.  30 tablet  0  . tiotropium (SPIRIVA HANDIHALER) 18 MCG inhalation capsule Place 1 capsule (18 mcg total) into inhaler and inhale daily.  30 capsule  3  . DISCONTD: ferrous gluconate (FERGON) 325 MG tablet Take 325 mg by mouth 3 (three) times daily with meals.        Marland Kitchen DISCONTD: losartan (COZAAR) 100 MG tablet Take 1 tablet (100 mg total) by mouth daily.  31 tablet  11    Assessment/Plan

## 2012-07-23 NOTE — Assessment & Plan Note (Signed)
The patient's BP is somewhat elevated today, currently on monotherapy with bisoprolol 5 mg. -increase bisoprolol to 10 mg/day -consider HCTZ if BP still elevated at next visit.

## 2012-07-23 NOTE — Patient Instructions (Signed)
For your headache, we are prescribing a medication called Sumatriptan.  Take 1 tablet at the start of your headache to relieve pain.  Your blood pressure is elevated today.  We are increasing the dose of your Bisoprolol medication to 10 mg once per day.  Please return for a follow-up visit in 5-6 months.

## 2012-07-23 NOTE — Assessment & Plan Note (Signed)
The patient was recently hospitalized for a COPD exacerbation.  She now notes no dyspnea, and no wheezing is heard on exam.  Patient was instructed to discontinue her prednisone medication, since she has taken 10 mg/day for the last 11 days, rather than the 10-day taper prescribed to her. -discontinue prednisone -continue albuterol, advair, spiriva

## 2012-07-23 NOTE — Assessment & Plan Note (Signed)
-  patient referred for screening mammogram, no prior mammogram

## 2012-07-23 NOTE — Assessment & Plan Note (Signed)
The patient is still trying to quit smoking.  She would like a refill of her nicotine patch. -nicotine patch refilled, 21 mg/day

## 2012-07-24 LAB — ANEMIA PANEL
%SAT: 6 % — ABNORMAL LOW (ref 20–55)
ABS Retic: 49.7 10*3/uL (ref 19.0–186.0)
RBC.: 4.52 MIL/uL (ref 3.87–5.11)
TIBC: 495 ug/dL — ABNORMAL HIGH (ref 250–470)
UIBC: 467 ug/dL — ABNORMAL HIGH (ref 125–400)

## 2012-07-24 LAB — BASIC METABOLIC PANEL
BUN: 26 mg/dL — ABNORMAL HIGH (ref 6–23)
CO2: 34 mEq/L — ABNORMAL HIGH (ref 19–32)
Chloride: 100 mEq/L (ref 96–112)
Potassium: 4.3 mEq/L (ref 3.5–5.3)

## 2012-07-25 DIAGNOSIS — D649 Anemia, unspecified: Secondary | ICD-10-CM | POA: Insufficient documentation

## 2012-07-25 MED ORDER — FERROUS SULFATE 325 (65 FE) MG PO TABS: 325.0000 mg | ORAL_TABLET | Freq: Two times a day (BID) | ORAL | Status: DC

## 2012-07-25 NOTE — Addendum Note (Signed)
Addended by: Linward Headland on: 07/25/2012 11:00 AM   Modules accepted: Orders

## 2012-07-25 NOTE — Assessment & Plan Note (Signed)
Addendum 07/25/12: The patient has a history of anemia.  Iron studies at today's visit show low ferritin.  The patient is post-menopausal, and has never had a colonscopy.  FOBT x1 was positive 04/2012 (inpatient admission).  No weight loss (actually, weight has steadily increased over the last 2 years) or night sweats. -start iron supplementation BID; can advance to TID if patient tolerates side effects -patient needs colonoscopy to rule-out colon cancer, and search for other cause of heme positive stools.  I've called the patient and explained the situation, and she agrees to undergo colonoscopy.  We will send in the referral.

## 2012-08-02 ENCOUNTER — Telehealth: Payer: Self-pay | Admitting: *Deleted

## 2012-08-02 NOTE — Telephone Encounter (Signed)
Thank you for taking care of this matter.  Agree with increasing office visits.  Her O2 saturation is concerning to me.  I'm glad her HHN is keeping an eye on her; if symptoms worsen, we should set up a clinic visit for further evaluation.

## 2012-08-02 NOTE — Telephone Encounter (Signed)
Call from Sedalia Muta RN [facility] with [facility] - # [phone] Nurse would like to increase visits to 2 times a week for Respiratory  status. Pt was seen today by RN, she has decrease breath sounds in lower lobes. Pt seems to be more SOB today. Nurse suggested ED visit but pt refused. Nurse wants to keep a closer observation. Pt still smoking some.  Taking meds good. VS - BP 140/80 P- 100 R 24 T 98.5 Pulse ox 86% on 2 liters.  I gave Okay to increase visits.  Is that okay? Hospital d/c 07/12/2012 for acute hypoxemic and hypercarbic respiratory failure secondary to recurrent COPD.

## 2012-08-06 ENCOUNTER — Ambulatory Visit: Payer: Medicare Other | Admitting: Emergency Medicine

## 2012-08-06 ENCOUNTER — Encounter: Payer: Self-pay | Admitting: Internal Medicine

## 2012-08-06 NOTE — Progress Notes (Signed)
Patient ID: Karina Weber, female   DOB: 03-21-1956, 56 y.o.   MRN: [id number]  This patient is a CHRONIC NO-SHOW PATIENT that has a history of HYPERTENSION.  Please make sure to address hypertension during her next clinic appointment, and intervene as appropriate.    Within the AVS, please incorporate the following smartphrase: .htntips   Pertinent Data: BP Readings from Last 3 Encounters:  07/23/12 149/85  07/20/12 124/66  07/12/12 114/71    BMI: Estimated Body mass index is 20.86 kg/(m^2) as calculated from the following:   Height as of 07/23/12: 5\' 0" (1.524 m).   Weight as of 07/23/12: 106 lb 12.8 oz(48.444 kg).  Smoking History: History  Smoking status  . Current Some Day Smoker -- 0.2 packs/day for 40 years  . Types: Cigarettes  Smokeless tobacco  . Never Used    Comment: Cut back from 2 ppd when diagnosed with Lung cancer.12/02/11 > 3 cig/day 07/20/12    Last Basic Metabolic Panel:    Component Value Date/Time   NA 141 07/23/2012 1647   K 4.3 07/23/2012 1647   CL 100 07/23/2012 1647   CO2 34* 07/23/2012 1647   BUN 26* 07/23/2012 1647   CREATININE 0.61 07/23/2012 1647   CREATININE 0.48* 07/12/2012 0650   GLUCOSE 96 07/23/2012 1647   CALCIUM 9.2 07/23/2012 1647    Allergies: Allergies  Allergen Reactions  . Lisinopril Cough

## 2012-08-09 ENCOUNTER — Ambulatory Visit (HOSPITAL_COMMUNITY): Payer: Medicare Other | Attending: Internal Medicine

## 2012-08-23 ENCOUNTER — Ambulatory Visit: Payer: Medicare Other | Admitting: Emergency Medicine

## 2012-08-27 ENCOUNTER — Ambulatory Visit: Payer: Medicare Other | Admitting: Emergency Medicine

## 2012-09-18 ENCOUNTER — Inpatient Hospital Stay (HOSPITAL_COMMUNITY): Payer: Medicare Other

## 2012-09-18 ENCOUNTER — Encounter (HOSPITAL_COMMUNITY): Payer: Self-pay | Admitting: Emergency Medicine

## 2012-09-18 ENCOUNTER — Inpatient Hospital Stay (HOSPITAL_COMMUNITY)
Admission: EM | Admit: 2012-09-18 | Discharge: 2012-09-24 | DRG: 208 | Disposition: A | Discharge status: Home / Self Care | Payer: Medicare Other | Attending: Internal Medicine | Admitting: Internal Medicine

## 2012-09-18 ENCOUNTER — Emergency Department (HOSPITAL_COMMUNITY): Payer: Medicare Other

## 2012-09-18 DIAGNOSIS — J9600 Acute respiratory failure, unspecified whether with hypoxia or hypercapnia: Secondary | ICD-10-CM | POA: Diagnosis present

## 2012-09-18 DIAGNOSIS — J441 Chronic obstructive pulmonary disease with (acute) exacerbation: Principal | ICD-10-CM | POA: Diagnosis present

## 2012-09-18 DIAGNOSIS — IMO0002 Reserved for concepts with insufficient information to code with codable children: Secondary | ICD-10-CM

## 2012-09-18 DIAGNOSIS — Z9119 Patient's noncompliance with other medical treatment and regimen: Secondary | ICD-10-CM

## 2012-09-18 DIAGNOSIS — I4891 Unspecified atrial fibrillation: Secondary | ICD-10-CM | POA: Diagnosis present

## 2012-09-18 DIAGNOSIS — D649 Anemia, unspecified: Secondary | ICD-10-CM

## 2012-09-18 DIAGNOSIS — J9612 Chronic respiratory failure with hypercapnia: Secondary | ICD-10-CM

## 2012-09-18 DIAGNOSIS — F1010 Alcohol abuse, uncomplicated: Secondary | ICD-10-CM | POA: Diagnosis present

## 2012-09-18 DIAGNOSIS — Z85118 Personal history of other malignant neoplasm of bronchus and lung: Secondary | ICD-10-CM

## 2012-09-18 DIAGNOSIS — Z801 Family history of malignant neoplasm of trachea, bronchus and lung: Secondary | ICD-10-CM

## 2012-09-18 DIAGNOSIS — F1410 Cocaine abuse, uncomplicated: Secondary | ICD-10-CM | POA: Diagnosis present

## 2012-09-18 DIAGNOSIS — Z91199 Patient's noncompliance with other medical treatment and regimen due to unspecified reason: Secondary | ICD-10-CM

## 2012-09-18 DIAGNOSIS — Z7982 Long term (current) use of aspirin: Secondary | ICD-10-CM

## 2012-09-18 DIAGNOSIS — F1910 Other psychoactive substance abuse, uncomplicated: Secondary | ICD-10-CM | POA: Diagnosis present

## 2012-09-18 DIAGNOSIS — J9620 Acute and chronic respiratory failure, unspecified whether with hypoxia or hypercapnia: Secondary | ICD-10-CM

## 2012-09-18 DIAGNOSIS — F17200 Nicotine dependence, unspecified, uncomplicated: Secondary | ICD-10-CM | POA: Diagnosis present

## 2012-09-18 DIAGNOSIS — J449 Chronic obstructive pulmonary disease, unspecified: Secondary | ICD-10-CM | POA: Diagnosis present

## 2012-09-18 DIAGNOSIS — F1020 Alcohol dependence, uncomplicated: Secondary | ICD-10-CM

## 2012-09-18 DIAGNOSIS — I10 Essential (primary) hypertension: Secondary | ICD-10-CM | POA: Diagnosis present

## 2012-09-18 DIAGNOSIS — Z923 Personal history of irradiation: Secondary | ICD-10-CM

## 2012-09-18 DIAGNOSIS — G9349 Other encephalopathy: Secondary | ICD-10-CM | POA: Diagnosis present

## 2012-09-18 DIAGNOSIS — F411 Generalized anxiety disorder: Secondary | ICD-10-CM | POA: Diagnosis present

## 2012-09-18 DIAGNOSIS — Z79899 Other long term (current) drug therapy: Secondary | ICD-10-CM

## 2012-09-18 DIAGNOSIS — E46 Unspecified protein-calorie malnutrition: Secondary | ICD-10-CM | POA: Diagnosis present

## 2012-09-18 DIAGNOSIS — F1210 Cannabis abuse, uncomplicated: Secondary | ICD-10-CM | POA: Diagnosis present

## 2012-09-18 HISTORY — DX: Other psychoactive substance abuse, uncomplicated: F19.10

## 2012-09-18 HISTORY — DX: Chronic respiratory failure with hypercapnia: J96.12

## 2012-09-18 HISTORY — DX: Headache: R51

## 2012-09-18 LAB — BLOOD GAS, ARTERIAL
Acid-Base Excess: 1.5 mmol/L (ref 0.0–2.0)
Bicarbonate: 31.6 meq/L — ABNORMAL HIGH (ref 20.0–24.0)
FIO2: 1 %
O2 Saturation: 94.9 %
Patient temperature: 98.6
TCO2: 35.3 mmol/L (ref 0–100)
pCO2 arterial: 122 mmHg (ref 35.0–45.0)
pH, Arterial: 7.043 — CL (ref 7.350–7.450)
pO2, Arterial: 106 mmHg — ABNORMAL HIGH (ref 80.0–100.0)

## 2012-09-18 LAB — POCT I-STAT 3, ART BLOOD GAS (G3+)
Acid-base deficit: 1 mmol/L (ref 0.0–2.0)
Bicarbonate: 26.4 mEq/L — ABNORMAL HIGH (ref 20.0–24.0)
O2 Saturation: 99 %
TCO2: 28 mmol/L (ref 0–100)
pO2, Arterial: 140 mmHg — ABNORMAL HIGH (ref 80.0–100.0)

## 2012-09-18 LAB — BASIC METABOLIC PANEL
BUN: 14 mg/dL (ref 6–23)
BUN: 17 mg/dL (ref 6–23)
Calcium: 10.8 mg/dL — ABNORMAL HIGH (ref 8.4–10.5)
Chloride: 94 mEq/L — ABNORMAL LOW (ref 96–112)
Chloride: 103 mEq/L (ref 96–112)
Creatinine, Ser: 0.49 mg/dL — ABNORMAL LOW (ref 0.50–1.10)
Creatinine, Ser: 0.58 mg/dL (ref 0.50–1.10)
GFR calc Af Amer: >90 mL/min (ref >90)
GFR calc non Af Amer: >90 mL/min (ref >90)
Glucose, Bld: 148 mg/dL — ABNORMAL HIGH (ref 70–99)
Potassium: 3.7 mEq/L (ref 3.5–5.1)

## 2012-09-18 LAB — URINE MICROSCOPIC-ADD ON

## 2012-09-18 LAB — URINALYSIS, ROUTINE W REFLEX MICROSCOPIC
Nitrite: NEGATIVE
Specific Gravity, Urine: 1.022 (ref 1.005–1.030)
Urobilinogen, UA: 1 mg/dL (ref 0.0–1.0)

## 2012-09-18 LAB — CBC WITH DIFFERENTIAL/PLATELET
Basophils Relative: 0 % (ref 0–1)
Eosinophils Absolute: 0 10*3/uL (ref 0.0–0.7)
Eosinophils Relative: 0 % (ref 0–5)
HCT: 48.6 % — ABNORMAL HIGH (ref 36.0–46.0)
Hemoglobin: 14.8 g/dL (ref 12.0–15.0)
MCH: 29.2 pg (ref 26.0–34.0)
MCHC: 30.5 g/dL (ref 30.0–36.0)
Monocytes Absolute: 2.1 10*3/uL — ABNORMAL HIGH (ref 0.1–1.0)
Monocytes Relative: 14 % — ABNORMAL HIGH (ref 3–12)
RDW: 18.7 % — ABNORMAL HIGH (ref 11.5–15.5)

## 2012-09-18 LAB — RAPID URINE DRUG SCREEN, HOSP PERFORMED
Barbiturates: NOT DETECTED
Cocaine: NOT DETECTED
Tetrahydrocannabinol: NOT DETECTED

## 2012-09-18 LAB — STREP PNEUMONIAE URINARY ANTIGEN: Strep Pneumo Urinary Antigen: NEGATIVE

## 2012-09-18 LAB — CBC
Hemoglobin: 11.7 g/dL — ABNORMAL LOW (ref 12.0–15.0)
MCH: 29 pg (ref 26.0–34.0)
MCHC: 30.5 g/dL (ref 30.0–36.0)
RDW: 18.7 % — ABNORMAL HIGH (ref 11.5–15.5)

## 2012-09-18 LAB — GLUCOSE, CAPILLARY
Glucose-Capillary: 138 mg/dL — ABNORMAL HIGH (ref 70–99)
Glucose-Capillary: 129 mg/dL — ABNORMAL HIGH (ref 70–99)

## 2012-09-18 LAB — TROPONIN I: Troponin I: <0.3 ng/mL (ref <0.30)

## 2012-09-18 LAB — PRO B NATRIURETIC PEPTIDE: Pro B Natriuretic peptide (BNP): 3321 pg/mL — ABNORMAL HIGH (ref 0–125)

## 2012-09-18 LAB — MRSA PCR SCREENING: MRSA by PCR: NEGATIVE

## 2012-09-18 LAB — LACTIC ACID, PLASMA: Lactic Acid, Venous: 0.9 mmol/L (ref 0.5–2.2)

## 2012-09-18 MED ORDER — ACETAMINOPHEN 650 MG RE SUPP: 650.0000 mg | Freq: Four times a day (QID) | RECTAL | Status: DC | PRN

## 2012-09-18 MED ORDER — IPRATROPIUM BROMIDE 0.02 % IN SOLN
0.5000 mg | Freq: Four times a day (QID) | RESPIRATORY_TRACT | Status: DC
  Administered 2012-09-18 – 2012-09-24 (×24): 0.5 mg via RESPIRATORY_TRACT
  Filled 2012-09-18 (×25): qty 2.5

## 2012-09-18 MED ORDER — ALBUTEROL SULFATE (5 MG/ML) 0.5% IN NEBU: 15.0000 mg | INHALATION_SOLUTION | Freq: Once | RESPIRATORY_TRACT | Status: DC

## 2012-09-18 MED ORDER — SUMATRIPTAN SUCCINATE 50 MG PO TABS
50.0000 mg | ORAL_TABLET | Freq: Once | ORAL | Status: AC | PRN
  Filled 2012-09-18: qty 1

## 2012-09-18 MED ORDER — IPRATROPIUM BROMIDE 0.02 % IN SOLN: 0.5000 mg | RESPIRATORY_TRACT | Status: DC

## 2012-09-18 MED ORDER — ACETAMINOPHEN 325 MG PO TABS
650.0000 mg | ORAL_TABLET | Freq: Four times a day (QID) | ORAL | Status: DC | PRN
  Administered 2012-09-21: 650 mg via ORAL
  Filled 2012-09-18: qty 2

## 2012-09-18 MED ORDER — MIDAZOLAM HCL 2 MG/2ML IJ SOLN
2.0000 mg | Freq: Once | INTRAMUSCULAR | Status: AC
  Administered 2012-09-18: 2 mg via INTRAVENOUS

## 2012-09-18 MED ORDER — ALBUTEROL (5 MG/ML) CONTINUOUS INHALATION SOLN
15.0000 mg | INHALATION_SOLUTION | Freq: Once | RESPIRATORY_TRACT | Status: AC
  Administered 2012-09-18: 15 mg via RESPIRATORY_TRACT
  Filled 2012-09-18: qty 20

## 2012-09-18 MED ORDER — LORAZEPAM 2 MG/ML IJ SOLN
1.0000 mg | Freq: Once | INTRAMUSCULAR | Status: AC
  Administered 2012-09-18: 1 mg via INTRAVENOUS
  Filled 2012-09-18: qty 1

## 2012-09-18 MED ORDER — FENTANYL CITRATE 0.05 MG/ML IJ SOLN
100.0000 ug | Freq: Once | INTRAMUSCULAR | Status: AC
  Administered 2012-09-18: 100 ug via INTRAVENOUS

## 2012-09-18 MED ORDER — ONDANSETRON HCL 4 MG/2ML IJ SOLN: 4.0000 mg | Freq: Four times a day (QID) | INTRAMUSCULAR | Status: DC | PRN

## 2012-09-18 MED ORDER — ETOMIDATE 2 MG/ML IV SOLN
10.0000 mg | Freq: Once | INTRAVENOUS | Status: AC
  Administered 2012-09-18: 5 mg via INTRAVENOUS

## 2012-09-18 MED ORDER — LEVOFLOXACIN IN D5W 750 MG/150ML IV SOLN
750.0000 mg | INTRAVENOUS | Status: DC
  Filled 2012-09-18: qty 150

## 2012-09-18 MED ORDER — MIRTAZAPINE 15 MG PO TABS
15.0000 mg | ORAL_TABLET | Freq: Every day | ORAL | Status: DC
  Filled 2012-09-18: qty 1

## 2012-09-18 MED ORDER — LEVOFLOXACIN IN D5W 500 MG/100ML IV SOLN
500.0000 mg | Freq: Once | INTRAVENOUS | Status: AC
  Administered 2012-09-18: 500 mg via INTRAVENOUS
  Filled 2012-09-18: qty 100

## 2012-09-18 MED ORDER — MIDAZOLAM BOLUS VIA INFUSION
1.0000 mg | INTRAVENOUS | Status: DC | PRN
  Filled 2012-09-18: qty 2

## 2012-09-18 MED ORDER — ROCURONIUM BROMIDE 50 MG/5ML IV SOLN
0.6000 mg/kg | Freq: Once | INTRAVENOUS | Status: AC
  Administered 2012-09-18: 29 mg via INTRAVENOUS
  Filled 2012-09-18: qty 2.9

## 2012-09-18 MED ORDER — FERROUS SULFATE 325 (65 FE) MG PO TABS
325.0000 mg | ORAL_TABLET | Freq: Two times a day (BID) | ORAL | Status: DC
  Administered 2012-09-18: 325 mg via ORAL
  Filled 2012-09-18 (×2): qty 1

## 2012-09-18 MED ORDER — SODIUM CHLORIDE 0.9 % IV SOLN
25.0000 ug/h | INTRAVENOUS | Status: DC
  Administered 2012-09-18: 100 ug/h via INTRAVENOUS
  Filled 2012-09-18 (×2): qty 50

## 2012-09-18 MED ORDER — METHYLPREDNISOLONE SODIUM SUCC 125 MG IJ SOLR
125.0000 mg | Freq: Once | INTRAMUSCULAR | Status: AC
  Administered 2012-09-18: 125 mg via INTRAVENOUS
  Filled 2012-09-18: qty 2

## 2012-09-18 MED ORDER — CHLORHEXIDINE GLUCONATE 0.12 % MT SOLN
OROMUCOSAL | Status: AC
  Administered 2012-09-18: 15 mL via OROMUCOSAL
  Filled 2012-09-18: qty 15

## 2012-09-18 MED ORDER — IPRATROPIUM BROMIDE 0.02 % IN SOLN
1.0000 mg | Freq: Once | RESPIRATORY_TRACT | Status: AC
  Administered 2012-09-18: 1 mg via RESPIRATORY_TRACT
  Filled 2012-09-18: qty 5

## 2012-09-18 MED ORDER — ALBUTEROL SULFATE (5 MG/ML) 0.5% IN NEBU
5.0000 mg | INHALATION_SOLUTION | Freq: Once | RESPIRATORY_TRACT | Status: AC
  Administered 2012-09-18: 5 mg via RESPIRATORY_TRACT

## 2012-09-18 MED ORDER — BISOPROLOL FUMARATE 10 MG PO TABS
10.0000 mg | ORAL_TABLET | Freq: Every day | ORAL | Status: DC
  Administered 2012-09-18: 10 mg via ORAL
  Filled 2012-09-18: qty 1

## 2012-09-18 MED ORDER — IPRATROPIUM BROMIDE 0.02 % IN SOLN: 0.5000 mg | Freq: Four times a day (QID) | RESPIRATORY_TRACT | Status: DC

## 2012-09-18 MED ORDER — MOMETASONE FURO-FORMOTEROL FUM 200-5 MCG/ACT IN AERO: 2.0000 | INHALATION_SPRAY | Freq: Two times a day (BID) | RESPIRATORY_TRACT | Status: DC

## 2012-09-18 MED ORDER — DEXTROSE 5 % IV SOLN
1.0000 g | INTRAVENOUS | Status: DC
  Administered 2012-09-18 – 2012-09-19 (×2): 1 g via INTRAVENOUS
  Filled 2012-09-18 (×3): qty 10

## 2012-09-18 MED ORDER — FERROUS SULFATE 300 (60 FE) MG/5ML PO SYRP
300.0000 mg | ORAL_SOLUTION | Freq: Two times a day (BID) | ORAL | Status: DC
  Administered 2012-09-18 – 2012-09-21 (×6): 300 mg
  Filled 2012-09-18 (×10): qty 5

## 2012-09-18 MED ORDER — LORAZEPAM 1 MG PO TABS: 1.0000 mg | ORAL_TABLET | Freq: Four times a day (QID) | ORAL | Status: DC | PRN

## 2012-09-18 MED ORDER — ENOXAPARIN SODIUM 40 MG/0.4ML ~~LOC~~ SOLN
40.0000 mg | SUBCUTANEOUS | Status: DC
  Filled 2012-09-18: qty 0.4

## 2012-09-18 MED ORDER — ASPIRIN 81 MG PO TABS
81.0000 mg | ORAL_TABLET | Freq: Every day | ORAL | Status: DC
Start: 2012-09-18 — End: 2012-09-18

## 2012-09-18 MED ORDER — ASPIRIN 81 MG PO CHEW
81.0000 mg | CHEWABLE_TABLET | Freq: Every day | ORAL | Status: DC
  Administered 2012-09-19 – 2012-09-24 (×6): 81 mg via ORAL
  Filled 2012-09-18 (×6): qty 1

## 2012-09-18 MED ORDER — PANTOPRAZOLE SODIUM 40 MG IV SOLR
40.0000 mg | INTRAVENOUS | Status: DC
  Administered 2012-09-18: 40 mg via INTRAVENOUS
  Filled 2012-09-18 (×2): qty 40

## 2012-09-18 MED ORDER — NICOTINE 21 MG/24HR TD PT24
21.0000 mg | MEDICATED_PATCH | TRANSDERMAL | Status: DC
  Filled 2012-09-18: qty 1

## 2012-09-18 MED ORDER — SODIUM CHLORIDE 0.9 % IV SOLN
INTRAVENOUS | Status: DC
  Administered 2012-09-18 – 2012-09-19 (×2): via INTRAVENOUS

## 2012-09-18 MED ORDER — FENTANYL BOLUS VIA INFUSION
25.0000 ug | Freq: Four times a day (QID) | INTRAVENOUS | Status: DC | PRN
  Administered 2012-09-18: 100 ug via INTRAVENOUS
  Filled 2012-09-18: qty 100

## 2012-09-18 MED ORDER — ALBUTEROL SULFATE (5 MG/ML) 0.5% IN NEBU: 2.5000 mg | INHALATION_SOLUTION | RESPIRATORY_TRACT | Status: DC

## 2012-09-18 MED ORDER — ONDANSETRON HCL 4 MG PO TABS: 4.0000 mg | ORAL_TABLET | Freq: Four times a day (QID) | ORAL | Status: DC | PRN

## 2012-09-18 MED ORDER — ALBUTEROL SULFATE (5 MG/ML) 0.5% IN NEBU
2.5000 mg | INHALATION_SOLUTION | RESPIRATORY_TRACT | Status: DC
  Administered 2012-09-18 – 2012-09-19 (×7): 2.5 mg via RESPIRATORY_TRACT
  Filled 2012-09-18 (×7): qty 0.5

## 2012-09-18 MED ORDER — LEVOFLOXACIN IN D5W 750 MG/150ML IV SOLN
750.0000 mg | INTRAVENOUS | Status: DC
  Administered 2012-09-19 – 2012-09-21 (×3): 750 mg via INTRAVENOUS
  Filled 2012-09-18 (×3): qty 150

## 2012-09-18 MED ORDER — CHLORHEXIDINE GLUCONATE 0.12 % MT SOLN
15.0000 mL | Freq: Two times a day (BID) | OROMUCOSAL | Status: DC
  Administered 2012-09-18: 15 mL via OROMUCOSAL

## 2012-09-18 MED ORDER — ROCURONIUM BROMIDE 50 MG/5ML IV SOLN: 0.6000 mg/kg | Freq: Once | INTRAVENOUS | Status: DC

## 2012-09-18 MED ORDER — TIOTROPIUM BROMIDE MONOHYDRATE 18 MCG IN CAPS: 18.0000 ug | ORAL_CAPSULE | Freq: Every day | RESPIRATORY_TRACT | Status: DC

## 2012-09-18 MED ORDER — ALBUTEROL SULFATE (5 MG/ML) 0.5% IN NEBU
INHALATION_SOLUTION | RESPIRATORY_TRACT | Status: AC
  Administered 2012-09-18: 5 mg via RESPIRATORY_TRACT
  Filled 2012-09-18: qty 1

## 2012-09-18 MED ORDER — BIOTENE DRY MOUTH MT LIQD
15.0000 mL | Freq: Four times a day (QID) | OROMUCOSAL | Status: DC
  Administered 2012-09-19: 15 mL via OROMUCOSAL

## 2012-09-18 MED ORDER — ENOXAPARIN SODIUM 30 MG/0.3ML ~~LOC~~ SOLN
30.0000 mg | SUBCUTANEOUS | Status: DC
  Administered 2012-09-18 – 2012-09-23 (×6): 30 mg via SUBCUTANEOUS
  Filled 2012-09-18 (×7): qty 0.3

## 2012-09-18 MED ORDER — SODIUM CHLORIDE 0.9 % IV SOLN
1.0000 mg/h | INTRAVENOUS | Status: DC
  Administered 2012-09-18 – 2012-09-19 (×2): 2 mg/h via INTRAVENOUS
  Filled 2012-09-18 (×2): qty 10

## 2012-09-18 MED ORDER — PREDNISONE 50 MG PO TABS
60.0000 mg | ORAL_TABLET | Freq: Every day | ORAL | Status: DC
  Filled 2012-09-18: qty 1

## 2012-09-18 NOTE — ED Provider Notes (Signed)
History     CSN: [id number]  Arrival date & time 09/18/12  4098   First MD Initiated Contact with Patient 09/18/12 [phone]      Chief Complaint  Patient presents with  . Shortness of Breath     HPI Pt was seen at 0705.   Per pt, c/o gradual onset and worsening of persistent cough and SOB for the past several days, worse today.  Endorses hx COPD and lung CA (no recent XRT or chemo) and continues to smoke cigarettes.  States she has been using her home MDI without relief.  Denies CP/palpitations, no back pain, no abd pain, no N/V/D, no fevers.    Past Medical History  Diagnosis Date  . Tobacco abuse   . COPD (chronic obstructive pulmonary disease)     exacerbagtion in 6/12 and in 2010.   Marland Kitchen Hypertension   . History of radiation therapy 07/19/10 to 07/28/10    RUL lung  . 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 Alcohol abuse     2 beers a day  . Asthma   . Headache   . Chronic hypercapnic respiratory failure   . Polysubstance abuse     Past Surgical History  Procedure Date  . No past surgeries     Family History  Problem Relation Age of Onset  . Heart disease Neg Hx   . Cancer Mother     lung  . Cancer Father     lung  . Hypertension      History  Substance Use Topics  . Smoking status: Current Some Day Smoker -- 0.2 packs/day for 40 years    Types: Cigarettes  . Smokeless tobacco: Never Used     Comment: Cut back from 2 ppd when diagnosed with Lung cancer.12/02/11 > 3 cig/day 07/20/12  . Alcohol Use: No     Comment: Former alcoholic    Review of Systems ROS: Statement: All systems negative except as marked or noted in the HPI; Constitutional: Negative for fever and chills. ; ; Eyes: Negative for eye pain, redness and discharge. ; ; ENMT: Negative for ear pain, hoarseness, nasal congestion, sinus pressure and sore throat. ; ; Cardiovascular: Negative for chest pain, palpitations, diaphoresis, and peripheral edema. ; ; Respiratory: +cough, SOB. Negative for  wheezing and stridor. ; ; Gastrointestinal: Negative for nausea, vomiting, diarrhea, abdominal pain, blood in stool, hematemesis, jaundice and rectal bleeding. . ; ; Genitourinary: Negative for dysuria, flank pain and hematuria. ; ; Musculoskeletal: Negative for back pain and neck pain. Negative for swelling and trauma.; ; Skin: Negative for pruritus, rash, abrasions, blisters, bruising and skin lesion.; ; Neuro: Negative for headache, lightheadedness and neck stiffness. Negative for weakness, altered level of consciousness , altered mental status, extremity weakness, paresthesias, involuntary movement, seizure and syncope.       Allergies  Lisinopril  Home Medications   Current Outpatient Rx  Name  Route  Sig  Dispense  Refill  . ALBUTEROL SULFATE HFA 108 (90 BASE) MCG/ACT IN AERS   Inhalation   Inhale 2 puffs into the lungs every 4 (four) hours as needed for wheezing or shortness of breath.   18 g   6   . ASPIRIN 81 MG PO TABS   Oral   Take 1 tablet (81 mg total) by mouth daily.   30 tablet   3   . BISOPROLOL FUMARATE 10 MG PO TABS   Oral   Take 1 tablet (10 mg  total) by mouth daily.   30 tablet   3   . FERROUS SULFATE 325 (65 FE) MG PO TABS   Oral   Take 1 tablet (325 mg total) by mouth 2 (two) times daily.   60 tablet   5   . FLUTICASONE-SALMETEROL 500-50 MCG/DOSE IN AEPB   Inhalation   Inhale 1 puff into the lungs every 12 (twelve) hours.   60 each   3   . MIRTAZAPINE 15 MG PO TABS   Oral   Take 1 tablet (15 mg total) by mouth at bedtime.   30 tablet   6   . NICOTINE 21 MG/24HR TD PT24   Transdermal   Place 1 patch onto the skin daily.   30 patch   1   . SUMATRIPTAN SUCCINATE 50 MG PO TABS   Oral   Take 1 tablet (50 mg total) by mouth once as needed for migraine.   30 tablet   2   . TIOTROPIUM BROMIDE MONOHYDRATE 18 MCG IN CAPS   Inhalation   Place 1 capsule (18 mcg total) into inhaler and inhale daily.   30 capsule   3     BP 134/108  Pulse  118  Temp 97.9 F (36.6 C) (Oral)  Resp 24  SpO2 87%  Physical Exam 0710: Physical examination:  Nursing notes reviewed; Vital signs and O2 SAT reviewed;  Constitutional: Well developed, Well nourished, Uncomfortable appearing; Head:  Normocephalic, atraumatic; Eyes: EOMI, PERRL, No scleral icterus; ENMT: Mouth and pharynx normal, Mucous membranes dry; Neck: Supple, Full range of motion, No lymphadenopathy; Cardiovascular: Tachycardic rate and rhythm, No gallop; Respiratory: Breath sounds diminished & equal bilaterally, No wheezes. Speaking sentences, sitting upright. Tachypneic, +access mm use; Chest: Nontender, Movement normal; Abdomen: Soft, Nontender, Nondistended, Normal bowel sounds; Genitourinary: No CVA tenderness; Extremities: Pulses normal, No tenderness, No edema, No calf edema or asymmetry.; Neuro: AA&Ox3, Major CN grossly intact.  Speech clear. No gross focal motor or sensory deficits in extremities.; Skin: Color normal, Warm, Dry.   ED Course  Procedures   0715:  Continuous neb and IV solumedrol ordered.  EPIC chart reviewed: pt with hx O2 Sats 81% R/A on her recent last OPC ofc visit with high of 86-91% on O2 N/C.  Will continue to monitor.   0815:  Lungs now coarse with faint insp wheezes bilat.  Neb continues with Sats low 90's.  Mentating per baseline.  0845:  Neb completed.  States she feels "better."  Still sitting upright, watching TV, less tachypneic, no further assess mm use.  Lungs coarse bilat, Sats low 90's on O2 N/C.  Speaking in sentences, conversive with ED staff.  Will dose levaquin IV, admit for exacerbation COPD.  Dx and testing d/w pt.  Questions answered.  Verb understanding, agreeable to admit.  0900:  Pt requesting "something for my nerves."  HR 140-160's.  Denies CP/palpitations.  Also denies recent cocaine use having "not had any in years" but UDS from 07/09/12 admit was +cocaine.  Will treat initially with ativan and avoid b-blockers until UDS results return.   T/C to [facility] Resident, case discussed, including:  HPI, pertinent PM/SHx, VS/PE, dx testing, ED course and treatment:  Agreeable to admit, requests they will come to ED for eval.    MDM  MDM Reviewed: previous chart, nursing note and vitals Reviewed previous: labs and ECG Interpretation: labs, ECG and x-ray Total time providing critical care: 30-74 minutes. This excludes time spent performing separately reportable procedures and services.  Consults: admitting MD   CRITICAL CARE Performed by: Laray Anger Total critical care time: 60 Critical care time was exclusive of separately billable procedures and treating other patients. Critical care was necessary to treat or prevent imminent or life-threatening deterioration. Critical care was time spent personally by me on the following activities: development of treatment plan with patient and/or surrogate as well as nursing, discussions with consultants, evaluation of patient's response to treatment, examination of patient, obtaining history from patient or surrogate, ordering and performing treatments and interventions, ordering and review of laboratory studies, ordering and review of radiographic studies, pulse oximetry and re-evaluation of patient's condition.    Date: 09/18/2012  Rate: 165  Rhythm: sinus tachycardia  QRS Axis: normal  Intervals: normal  ST/T Wave abnormalities: nonspecific T wave changes  Conduction Disutrbances:none  Narrative Interpretation:   Old EKG Reviewed: unchanged; no significant changes from previous EKG dated 07/09/2012.  Results for orders placed during the hospital encounter of 09/18/12  CBC WITH DIFFERENTIAL      Component Value Range   WBC 14.6 (*) 4.0 - 10.5 K/uL   RBC 5.07  3.87 - 5.11 MIL/uL   Hemoglobin 14.8  12.0 - 15.0 g/dL   HCT 16.1 (*) 09.6 - 04.5 %   MCV 95.9  78.0 - 100.0 fL   MCH 29.2  26.0 - 34.0 pg   MCHC 30.5  30.0 - 36.0 g/dL   RDW 40.9 (*) 81.1 - 91.4 %   Platelets 145 (*) 150  - 400 K/uL   Neutrophils Relative 69  43 - 77 %   Neutro Abs 10.1 (*) 1.7 - 7.7 K/uL   Lymphocytes Relative 16  12 - 46 %   Lymphs Abs 2.4  0.7 - 4.0 K/uL   Monocytes Relative 14 (*) 3 - 12 %   Monocytes Absolute 2.1 (*) 0.1 - 1.0 K/uL   Eosinophils Relative 0  0 - 5 %   Eosinophils Absolute 0.0  0.0 - 0.7 K/uL   Basophils Relative 0  0 - 1 %   Basophils Absolute 0.0  0.0 - 0.1 K/uL  BASIC METABOLIC PANEL      Component Value Range   Sodium 138  135 - 145 mEq/L   Potassium 4.6  3.5 - 5.1 mEq/L   Chloride 94 (*) 96 - 112 mEq/L   CO2 27  19 - 32 mEq/L   Glucose, Bld 127 (*) 70 - 99 mg/dL   BUN 14  6 - 23 mg/dL   Creatinine, Ser 7.82 (*) 0.50 - 1.10 mg/dL   Calcium 95.6 (*) 8.4 - 10.5 mg/dL   GFR calc non Af Amer >90  >90 mL/min   GFR calc Af Amer >90  >90 mL/min  TROPONIN I      Component Value Range   Troponin I <0.30  <0.30 ng/mL  ETHANOL      Component Value Range   Alcohol, Ethyl (B) <11  0 - 11 mg/dL   Dg Chest Port 1 View 09/18/2012  *RADIOLOGY REPORT*  Clinical Data: Cough.  PORTABLE CHEST - 1 VIEW  Comparison: 07/12/2012.  Findings: The heart is enlarged but stable.  There is tortuosity of the thoracic aorta.  The lungs demonstrate peribronchial thickening and increased interstitial markings.  Findings could suggest severe bronchitis, interstitial pneumonitis or interstitial edema.  No definite pleural effusions.  IMPRESSION:  1.  Cardiac enlargement with peribronchial thickening and increased interstitial markings which could reflect bronchitis, interstitial pneumonitis or interstitial edema. 2.  No focal  airspace consolidation or definite pleural effusion.   Original Report Authenticated By: Rudie Meyer, M.D.           Laray Anger, DO 09/18/12 [id number]

## 2012-09-18 NOTE — Significant Event (Addendum)
Rapid Response Event Note  Overview:  Called to see patient with increased respiratory distress and decreased LOC Time Called: 1114 Arrival Time: 1117 Event Type: Respiratory  Initial Focused Assessment: On arrival patient supine in bed - unresponsive except for grunting with DPS - skin hot and moist - using accessory muscles to breathe = rapid rate 38-42 - labored - on NRB mask - per staff [facility] RN sats had dropped to 60% on 4 liter nasal cannula - now 90% on NRB- bil BS present but moving very little air - some exp wheezing noted - few Rhonchi noted - BP 160/111 HR 134 regular = Respiratory therapy present in room.  Per chart patient is chronic CO2 retainer with COPD hx.    Interventions: Repositioned patient - stat ABG ordered - RT to get nebulizer tx - stat call to CCM Dr. Molli Knock. AMBU bag to room.  Stat call for ICU bed.  ABG results:  7.04 pcO2 122 Po2 106 Bicarb 36 - patient known retainer - decreased O2 flow to venti mask - Dr. Allena Katz and Dr. Molli Knock present with NP Anders Simmonds.  Patient transported stat to ICU 2104 with monitors and Zoll - assisted with intubation in 2104. Patient tol well.  BP 112/60  HR 112 - ST - RR per ventilator.  Handoff report to [employer].     Event Summary: Name of Physician Notified: Dr. Allena Katz at [phone]  Name of Consulting Physician Notified: Dr. Molli Knock at 1125  Outcome: Transferred (Comment) (2104)  Event End Time: 1200  [place]

## 2012-09-18 NOTE — ED Notes (Signed)
Pt continues to remove nebulizer mask. SpO2 in 60's. Dr.McManus notified.

## 2012-09-18 NOTE — ED Notes (Signed)
PT. REPORTS PROGRESSING SOB WITH DRY COUGH FOR 2 DAYS WORSE THIS MORNING , STATES HISTORY OF LUNG CANCER.

## 2012-09-18 NOTE — Progress Notes (Signed)
ANTIBIOTIC CONSULT NOTE - INITIAL  Pharmacy Consult for rocephin Indication: suspected PNA  Allergies  Allergen Reactions  . Lisinopril Cough    Patient Measurements: Height: 5' (152.4 cm) IBW/kg (Calculated) : 45.5   Vital Signs: Temp: 97.9 F (36.6 C) (12/17 1246) Temp src: Oral (12/17 1246) BP: 152/84 mmHg (12/17 1129) Pulse Rate: 131  (12/17 1129) Intake/Output from previous day:   Intake/Output from this shift:    Labs:  [facility] 09/18/12 1214 09/18/12 0733  WBC 8.3 14.6*  HGB 11.7* 14.8  PLT 113* 145*  LABCREA -- --  CREATININE 0.58 0.49*   The CrCl is unknown because both a height and weight (above a minimum accepted value) are required for this calculation. No results found for this basename: VANCOTROUGH:2,VANCOPEAK:2,VANCORANDOM:2,GENTTROUGH:2,GENTPEAK:2,GENTRANDOM:2,TOBRATROUGH:2,TOBRAPEAK:2,TOBRARND:2,AMIKACINPEAK:2,AMIKACINTROU:2,AMIKACIN:2, in the last 72 hours    Medical History: Past Medical History  Diagnosis Date  . Tobacco abuse   . COPD (chronic obstructive pulmonary disease)     exacerbagtion in 6/12 and in 2010.   Marland Kitchen Hypertension   . History of radiation therapy 07/19/10 to 07/28/10    RUL lung  . 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 Alcohol abuse     2 beers a day  . Asthma   . Headache   . Chronic hypercapnic respiratory failure   . Polysubstance abuse     Medications:  Prescriptions prior to admission  Medication Sig Dispense Refill  . albuterol (VENTOLIN HFA) 108 (90 BASE) MCG/ACT inhaler Inhale 2 puffs into the lungs every 4 (four) hours as needed for wheezing or shortness of breath.  18 g  6  . aspirin 81 MG tablet Take 1 tablet (81 mg total) by mouth daily.  30 tablet  3  . bisoprolol (ZEBETA) 10 MG tablet Take 1 tablet (10 mg total) by mouth daily.  30 tablet  3  . ferrous sulfate 325 (65 FE) MG tablet Take 1 tablet (325 mg total) by mouth 2 (two) times daily.  60 tablet  5  . Fluticasone-Salmeterol (ADVAIR) 500-50  MCG/DOSE AEPB Inhale 1 puff into the lungs every 12 (twelve) hours.  60 each  3  . mirtazapine (REMERON) 15 MG tablet Take 1 tablet (15 mg total) by mouth at bedtime.  30 tablet  6  . nicotine (NICODERM CQ - DOSED IN MG/24 HOURS) 21 mg/24hr patch Place 1 patch onto the skin daily.  30 patch  1  . SUMAtriptan (IMITREX) 50 MG tablet Take 1 tablet (50 mg total) by mouth once as needed for migraine.  30 tablet  2  . tiotropium (SPIRIVA HANDIHALER) 18 MCG inhalation capsule Place 1 capsule (18 mcg total) into inhaler and inhale daily.  30 capsule  3   Assessment: 56 yo female here with AECOPD to begin empiric antibiotics (levaquin and ceftriaxone).  SCr= 0.58 and estimated CrCl is around 90 ml/min.    Plan:  -Ceftriaxone 1gm IV q24hr -No levaquin changes needed -Will follow renal function and cultures  Harland German, Pharm D 09/18/2012 1:45 PM

## 2012-09-18 NOTE — Progress Notes (Signed)
SUP ordered  

## 2012-09-18 NOTE — ED Notes (Signed)
Pt sitting on edge of bed tripoding, purse lipped breathing with neb treatment in place. Respiratory at bedside.

## 2012-09-18 NOTE — Care Management Note (Deleted)
    Page 1 of 1   09/18/2012     1:07:59 PM   CARE MANAGEMENT NOTE 09/18/2012  Patient:  Karina Weber, Karina Weber   Account Number:  [id number]  Date Initiated:  09/18/2012  Documentation initiated by:  [facility]  Subjective/Objective Assessment:   SOB with AFib with RVR.  Lives alone.     Action/Plan:   Anticipated DC Date:  09/21/2012   Anticipated DC Plan:  HOME W HOME HEALTH SERVICES      DC Planning Services  CM consult      Choice offered to / List presented to:             Status of service:  In process, will continue to follow Medicare Important Message given?   (If response is "NO", the following Medicare IM given date fields will be blank) Date Medicare IM given:   Date Additional Medicare IM given:    Discharge Disposition:    Per UR Regulation:  Reviewed for med. necessity/level of care/duration of stay  If discussed at Long Length of Stay Meetings, dates discussed:    Comments:  09-18-12 1:05pm Avie Arenas, RNBSN [phone] Patient lives at home alone, husband in [facility] - she does not want to go to SNF.  Sister plans to be with her on discharge. CM will continue to follow for needs.

## 2012-09-18 NOTE — Procedures (Signed)
Arterial Catheter Insertion Procedure Note DEANDA RUDDELL [id number] 1956-01-11  Procedure: Insertion of Arterial Catheter  Indications: Blood pressure monitoring  Procedure Details Consent: Unable to obtain consent because of emergent medical necessity. Time Out: Verified patient identification, verified procedure, site/side was marked, verified correct patient position, special equipment/implants available, medications/allergies/relevent history reviewed, required imaging and test results available.  Performed  Maximum sterile technique was used including antiseptics, cap, gloves, gown, hand hygiene, mask and sheet. Skin prep: Chlorhexidine; local anesthetic administered 20 gauge catheter was inserted into right radial artery using the Seldinger technique.  Evaluation Blood flow good; BP tracing good. Complications: No apparent complications.   [name] 09/18/2012

## 2012-09-18 NOTE — H&P (Signed)
Hospital Admission Note Date: 09/18/2012  Patient name: Karina Weber Medical record number: [id number] Date of birth: 1956-01-17 Age: 56 y.o. Gender: female PCP: PRIBULA,CHRISTOPHER, MD  Medical Service: Internal medicine teaching service  Attending physician: Dr. Aundria Rud    1st Contact: Sadek    Pager: [phone] 2nd Contact: Manson Passey    Pager: [phone] After 5 pm or weekends: 1st Contact:      Pager: [phone] 2nd Contact:      Pager: [phone]  Chief Complaint: Shortness of breath  History of Present Illness: Patient is a 56 year woman with past history of severe COPD, long term smoking and multiple other medical problems as per problem list who comes to the ED due to acute worsening of shortness of breath. Patient woke up today and was not able to catch her breath. She also reports wheezing. She was progressively getting worse for past 2 days and wanted to come to the hospital yesterday but did not get a ride. She also reports having cough with whitish sputum production for past 2 days without any blood.  Also reports midsternal chest pain, which she says accompanies her COPD flareup every time. She denies any headache, vision changes, palpitations, radiation of chest pain.  She reports using one inhaler every day. Unclear if she uses on her COPD inhalers. Denies any abdominal pain, diarrhea, nausea vomiting.  Meds: No current outpatient prescriptions on file.  Allergies: Allergies as of 09/18/2012 - Review Complete 09/18/2012  Allergen Reaction Noted  . Lisinopril Cough 07/28/2011   Past Medical History  Diagnosis Date  . Tobacco abuse   . COPD (chronic obstructive pulmonary disease)     exacerbagtion in 6/12 and in 2010.   Marland Kitchen Hypertension   . History of radiation therapy 07/19/10 to 07/28/10    RUL lung  . 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 Alcohol abuse     2 beers a day  . Asthma   . Headache   . Chronic hypercapnic respiratory failure   . Polysubstance  abuse    Past Surgical History  Procedure Date  . No past surgeries    Family History  Problem Relation Age of Onset  . Heart disease Neg Hx   . Cancer Mother     lung  . Cancer Father     lung  . Hypertension     History   Social History  . Marital Status: Single    Spouse Name: N/A    Number of Children: N/A  . Years of Education: N/A   Occupational History  . Not on file.   Social History Main Topics  . Smoking status: Current Some Day Smoker -- 0.2 packs/day for 40 years    Types: Cigarettes  . Smokeless tobacco: Never Used     Comment: Cut back from 2 ppd when diagnosed with Lung cancer.12/02/11 > 3 cig/day 07/20/12  . Alcohol Use: No     Comment: Former alcoholic  . Drug Use: Yes    Special: Cocaine, Marijuana  . Sexually Active: Not on file   Other Topics Concern  . Not on file   Social History Narrative   Lives with a roommate (mutual grandmother to grandchildren). She takes care of herself, does not have any assistance when she is sick. He is able to perform ADLs when she is not sick . Used to work in [employer], retired 2 years ago . Education: 9th grade . SHe on Medicaid now. Smokes 1 pack  per day (since age 29 y.o)Drinks 4 cans of beer every day or week. Denies drugs though UDS multiple admissions is positive.      Review of Systems: As per history of present illness.  Physical Exam: Blood pressure 123/59, pulse 127, temperature 97.9 F (36.6 C), temperature source Oral, resp. rate 25, SpO2 87.00%. Constitutional: Vital signs reviewed.  Patient is cachectic, in no acute distress and cooperative with exam.  Alert and oriented x3.  Head: Normocephalic and atraumatic Mouth: no erythema or exudates, MMM Eyes: PERRL, EOMI, conjunctivae normal, No scleral icterus.  Neck: Supple, Trachea midline normal ROM, No JVD, Cardiovascular: Tachycardic, S1 normal, S2 normal, no MRG Pulmonary/Chest: Decreased air entry bilaterally. Diffuse wheezing bilaterally.  Speaking full sentences but using accessory muscles of respiration. Abdominal: Soft. Non-tender, non-distended, bowel sounds are normal.  Musculoskeletal: No joint deformities, erythema, or stiffness, ROM full and no nontender Neurological: A&O x3, Strength is normal and symmetric bilaterally, cranial nerve II-XII are grossly intact, no focal motor deficit, sensory intact to light touch bilaterally.  Skin: Warm, dry and intact. No rash, cyanosis, or clubbing.     Lab results: Basic Metabolic Panel:  [facility] 09/18/12 0733  NA 138  K 4.6  CL 94*  CO2 27  GLUCOSE 127*  BUN 14  CREATININE 0.49*  CALCIUM 10.8*  MG --  PHOS --   CBC:  Basename 09/18/12 0733  WBC 14.6*  NEUTROABS 10.1*  HGB 14.8  HCT 48.6*  MCV 95.9  PLT 145*   Cardiac Enzymes:  Basename 09/18/12 0735  CKTOTAL --  CKMB --  CKMBINDEX --  TROPONINI <0.30   Urine Drug Screen: Drugs of Abuse     Component Value Date/Time   LABOPIA NEGATIVE 07/09/2012 1535   LABOPIA NONE DETECTED 11/27/2009 0514   COCAINSCRNUR POSITIVE* 07/09/2012 1535   COCAINSCRNUR NONE DETECTED 11/27/2009 0514   LABBENZ NEGATIVE 07/09/2012 1535   LABBENZ NONE DETECTED 11/27/2009 0514   AMPHETMU NEGATIVE 07/09/2012 1535   AMPHETMU NONE DETECTED 11/27/2009 0514   THCU NONE DETECTED 11/27/2009 0514   LABBARB  Value: NONE DETECTED        DRUG SCREEN FOR MEDICAL PURPOSES ONLY.  IF CONFIRMATION IS NEEDED FOR ANY PURPOSE, NOTIFY LAB WITHIN 5 DAYS.        LOWEST DETECTABLE LIMITS FOR URINE DRUG SCREEN Drug Class       Cutoff (ng/mL) Amphetamine      1000 Barbiturate      200 Benzodiazepine   200 Tricyclics       300 Opiates          300 Cocaine          300 THC              50 11/27/2009 0514    Alcohol Level:  Basename 09/18/12 0733  ETH <11     Imaging results:  Dg Chest Port 1 View  09/18/2012  *RADIOLOGY REPORT*  Clinical Data: Cough.  PORTABLE CHEST - 1 VIEW  Comparison: 07/12/2012.  Findings: The heart is enlarged but stable.  There is  tortuosity of the thoracic aorta.  The lungs demonstrate peribronchial thickening and increased interstitial markings.  Findings could suggest severe bronchitis, interstitial pneumonitis or interstitial edema.  No definite pleural effusions.  IMPRESSION:  1.  Cardiac enlargement with peribronchial thickening and increased interstitial markings which could reflect bronchitis, interstitial pneumonitis or interstitial edema. 2.  No focal airspace consolidation or definite pleural effusion.   Original Report Authenticated By: Rudie Meyer, M.D.  Other results: EKG: New onset A. Fib/flutter.  Old right axis deviation.   Assessment & Plan by Problem: Principal Problem:  *COPD with acute exacerbation Active Problems:  TOBACCO USE  ESSENTIAL HYPERTENSION  Severe chronic obstructive pulmonary disease  Substance abuse  New onset a-fib  1) Acute respiratory failure- 2/2 exacerbation of severe COPD: Patient with severe COPD with multiple hospital admissions for exacerbations- last admission during intubation, due to possible medication noncompliance, substance abuse and other social issues.  Unclear etiology of this exacerbation event. Patient reports cough and cold symptoms. Unclear if she has insight about using her inhalers. WBC 14.6. Afebrile. Chest x-ray negative for infiltrates but shows bronchitis.  Plan: Patient was admitted to telemetry bed. - Duonebs every 4 hours. - Levofloxacin and prednisone started. - Continue home inhalers. - Although as soon as patient arrived on the floor, she became unresponsive and  dropped her O2 sats in 60s. Rapid response was called and I rushed to the patient's room.  - ABG showed pH 7.04 - PCCM was immediately called and patient was transferred to ICU and intubated.   2) New onset A. fib : Most likely from severe COPD and respiratory distress. - Repeat EKG in a.m.  -Continue telemetry   3) Polysubstance abuse : History of cocaine use . - UDS ordered-  but needs to be collected.   4) Tobacco abuse : Nicotine patch. Patient still smoking until last night.   Dispo: Disposition is deferred at this time, awaiting improvement of current medical problems. Anticipated discharge in approximately 4-5 day(s).   The patient does have a current PCP (PRIBULA,CHRISTOPHER, MD), therefore will be requiring OPC follow-up after discharge.   The patient does not have transportation limitations that hinder transportation to clinic appointments.  Signed: Simya Tercero 09/18/2012, 11:22 AM

## 2012-09-18 NOTE — Consult Note (Signed)
PULMONARY  / CRITICAL CARE MEDICINE  Name: Karina Weber MRN: [id number] DOB: Jun 03, 1956    LOS: 0  REFERRING MD :  Aundria Rud   CHIEF COMPLAINT:  Respiratory failure   BRIEF PATIENT DESCRIPTION:  56 year old female active smoker w/ chronic resp failure on basis of COPD and medical non-compliance. Admitted on 12/17 for acute respiratory failure.   LINES / TUBES: OETT 12/17>>> Right IJ CVL 12/17>>> CULTURES: Sputum 12/17>>> U strep 12/17>>> U legionella 12/17>>> Influenza swab 12/17>>>  ANTIBIOTICS: levaquin 12/17>>> Rocephin 12/17>>>  SIGNIFICANT EVENTS:   LEVEL OF CARE:  ICU PRIMARY SERVICE:  PCCM CONSULTANTS:   CODE STATUS: full DIET: NPO  DVT Px: LMWH GI Px:  PPI   HISTORY OF PRESENT ILLNESS:   Patient is a 60 year woman with past history of severe COPD, long term smoking and multiple other medical problems as per problem list who comes to the ED on 12/17 due to acute worsening of shortness of breath. Patient woke up today and was not able to catch her breath. She also reports wheezing. She was progressively getting worse for past 2 days and wanted to come to the hospital yesterday but did not get a ride. She also reports having cough with whitish sputum production for past 2 days without any blood. Also reports midsternal chest pain, which she says accompanies her COPD flareup every time. She was admitted to medical ward. Developed progressive respiratory failure and decreased LOC. Because of this she was transferred to the ICU and emergently intubated.   She denies any headache, vision changes, palpitations, radiation of chest pain.   PAST MEDICAL HISTORY :  Past Medical History  Diagnosis Date  . Tobacco abuse   . COPD (chronic obstructive pulmonary disease)     exacerbagtion in 6/12 and in 2010.   Marland Kitchen Hypertension   . History of radiation therapy 07/19/10 to 07/28/10    RUL lung  . 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 Alcohol abuse     2 beers a day   . Asthma   . Headache   . Chronic hypercapnic respiratory failure   . Polysubstance abuse    Past Surgical History  Procedure Date  . No past surgeries    Prior to Admission medications   Medication Sig Start Date End Date Taking? Authorizing Provider  albuterol (VENTOLIN HFA) 108 (90 BASE) MCG/ACT inhaler Inhale 2 puffs into the lungs every 4 (four) hours as needed for wheezing or shortness of breath. 07/23/12  Yes Linward Headland, MD  aspirin 81 MG tablet Take 1 tablet (81 mg total) by mouth daily. 07/12/12  Yes Annett Gula, MD  bisoprolol (ZEBETA) 10 MG tablet Take 1 tablet (10 mg total) by mouth daily. 07/23/12 07/23/13 Yes Linward Headland, MD  ferrous sulfate 325 (65 FE) MG tablet Take 1 tablet (325 mg total) by mouth 2 (two) times daily. 07/25/12 07/25/13 Yes Linward Headland, MD  Fluticasone-Salmeterol (ADVAIR) 500-50 MCG/DOSE AEPB Inhale 1 puff into the lungs every 12 (twelve) hours. 07/12/12  Yes Annett Gula, MD  mirtazapine (REMERON) 15 MG tablet Take 1 tablet (15 mg total) by mouth at bedtime. 07/12/12  Yes Annett Gula, MD  nicotine (NICODERM CQ - DOSED IN MG/24 HOURS) 21 mg/24hr patch Place 1 patch onto the skin daily. 07/23/12  Yes Linward Headland, MD  SUMAtriptan (IMITREX) 50 MG tablet Take 1 tablet (50 mg total) by mouth once as needed for  migraine. 07/23/12 07/23/13 Yes Linward Headland, MD  tiotropium (SPIRIVA HANDIHALER) 18 MCG inhalation capsule Place 1 capsule (18 mcg total) into inhaler and inhale daily. 07/12/12 07/12/13 Yes Annett Gula, MD   Allergies  Allergen Reactions  . Lisinopril Cough    FAMILY HISTORY:  Family History  Problem Relation Age of Onset  . Heart disease Neg Hx   . Cancer Mother     lung  . Cancer Father     lung  . Hypertension     SOCIAL HISTORY:  reports that she has been smoking Cigarettes.  She has a 8 pack-year smoking history. She has never used smokeless tobacco. She reports that she uses illicit drugs (Cocaine and Marijuana). She  reports that she does not drink alcohol.  REVIEW OF SYSTEMS:  Unable    VITAL SIGNS: Temp:  [97.9 F (36.6 C)] 97.9 F (36.6 C) (12/17 0706) Pulse Rate:  [57-135] 131  (12/17 1129) Resp:  [22-30] 28  (12/17 1129) BP: (100-174)/(56-128) 152/84 mmHg (12/17 1129) SpO2:  [76 %-94 %] 94 % (12/17 1133) FiO2 (%):  [80 %-100 %] 80 % (12/17 1155) HEMODYNAMICS:   VENTILATOR SETTINGS: Vent Mode:  [-] PRVC FiO2 (%):  [80 %-100 %] 80 % Set Rate:  [24 bmp] 24 bmp Vt Set:  [360 mL] 360 mL PEEP:  [5 cmH20] 5 cmH20 Plateau Pressure:  [20 cmH20] 20 cmH20 INTAKE / OUTPUT: Intake/Output    None     PHYSICAL EXAMINATION: General:  Chronically ill appearing AAF, now intubated  Neuro:  Sedated on vent  HEENT:  Poor dentition, no JVD, orally intubated, right IJ intact  Cardiovascular:  Tachy rrr, no MRG Lungs:  Prolonged exp wz +++ marked accessory muscle use  Abdomen:  Soft, non-tender  Musculoskeletal:  Intact  Skin:  Intact    LABS: Cbc  Lab 09/18/12 0733  WBC 14.6*  HGB 14.8  HCT 48.6*  PLT 145*    Chemistry   Lab 09/18/12 0733  NA 138  K 4.6  CL 94*  CO2 27  BUN 14  CREATININE 0.49*  CALCIUM 10.8*  MG --  PHOS --  GLUCOSE 127*    Liver fxn No results found for this basename: AST:3,ALT:3,ALKPHOS:3,BILITOT:3,PROT:3,ALBUMIN:3 in the last 168 hours coags No results found for this basename: APTT:3,INR:3 in the last 168 hours Sepsis markers No results found for this basename: LATICACIDVEN:3,PROCALCITON:3 in the last 168 hours Cardiac markers  Lab 09/18/12 0735  CKTOTAL --  CKMB --  TROPONINI <0.30   BNP No results found for this basename: PROBNP:3 in the last 168 hours ABG  Lab 09/18/12 1120  PHART 7.043*  PCO2ART 122.0*  PO2ART 106.0*  HCO3 31.6*  TCO2 35.3    CBG trend  Lab 09/18/12 1117  GLUCAP 138*    IMAGING: ETT and right IJ CVL in good position. Mild interstitial process. No clear edema  ECG:  DIAGNOSES: Principal Problem:  *COPD  with acute exacerbation Active Problems:  TOBACCO USE  ESSENTIAL HYPERTENSION  Severe chronic obstructive pulmonary disease  Substance abuse  New onset a-fib   ASSESSMENT / PLAN:  PULMONARY  ASSESSMENT: Acute on chronic respiratory failure in setting of AECOPD Pulmonary infiltrates.  Suspect this is primarily AECOPD in setting of non-compliance +/- cocaine abuse. CXR could reflect some degree of edema.  PLAN:   Full vent support Scheduled BDs Systemic steroids Empiric avelox and rocephin  Cycle CEs, check BNP and U drug screen F/u cxr and abg   CARDIOVASCULAR  ASSESSMENT:  ST. Think this was primarily due to work of breathing and Continuous neb therapy. Better after intubation.  PLAN:  Tele Hydration  Avoid beta blockers  RENAL  ASSESSMENT:   No acute issue  PLAN:   Supportive IVFs Strict I&O F/u chem  GASTROINTESTINAL  ASSESSMENT:   No acute issue PLAN:   SUP   HEMATOLOGIC Mild thrombocytopenia  ASSESSMENT:    PLAN:  Supportive care [place] heparin  SUP F/u cbc   INFECTIOUS  ASSESSMENT:   AECOPD/ purulent bronchitis  PLAN:   See dashboard  Start CAP coverage in case CXR blossoms,  F/u PCTs, will rapid wean off ABX  ENDOCRINE  ASSESSMENT:  Mild hyperglycemia  Steroid induced.  PLAN:   ssi protocol   NEUROLOGIC  ASSESSMENT:   Acute encephalopathy  In setting of hypercarbia and hypoxia  PLAN:   Ventilate  Supportive care Sedation protocol  Watch for evidence of w/d  CLINICAL SUMMARY:  56 year old female active smoker and polysubs abuse w/ pmh of medical [place]. Presents again with AECOPD, required intubation for progressive resp failure. We will treat aggressively for BDs, systemic steroids, empiric abx. Also need to send urine studies for UDS.   I have personally obtained a history, examined the patient, evaluated laboratory and imaging results, formulated the assessment and plan and placed orders. CRITICAL CARE: The patient is  critically ill with multiple organ systems failure and requires high complexity decision making for assessment and support, frequent evaluation and titration of therapies, application of advanced monitoring technologies and extensive interpretation of multiple databases. Critical Care Time devoted to patient care services described in this note is 45 minutes.    Pulmonary and Critical Care Medicine [facility] Pager: [phone]  09/18/2012, 12:29 PM

## 2012-09-18 NOTE — Progress Notes (Signed)
Around 11am pt arrived to floor, pt hard to arouse and not able to follow commands, rapid breathing, sweaty; pts O2 62% on 4L [employer]- pt repositioned, non-re breather applied (sats up to 90%-80%), BP 150s/80s, HR 130s-140s; rapid response called, MD paged and made aware; orders received to tx to ICU, report given

## 2012-09-18 NOTE — Procedures (Signed)
Intubation Procedure Note Karina Weber [id number] [birth year]  Procedure: Intubation Indications: Respiratory insufficiency  Procedure Details Consent: Unable to obtain consent because of emergent medical necessity. Time Out: Verified patient identification, verified procedure, site/side was marked, verified correct patient position, special equipment/implants available, medications/allergies/relevent history reviewed, required imaging and test results available.  Performed  Maximum sterile technique was used including antiseptics, gloves, hand hygiene and mask.  MAC    Evaluation Hemodynamic Status: BP stable throughout; O2 sats: stable throughout Patient's Current Condition: stable Complications: No apparent complications Patient did tolerate procedure well. Chest X-ray ordered to verify placement.  CXR: pending.   Karina Weber 09/18/2012

## 2012-09-18 NOTE — Procedures (Signed)
Central Venous Catheter Insertion Procedure Note Karina Weber [id number] 07/11/1956  Procedure: Insertion of Central Venous Catheter Indications: Drug and/or fluid administration  Procedure Details Consent: Unable to obtain consent because of emergent medical necessity. Time Out: Verified patient identification, verified procedure, site/side was marked, verified correct patient position, special equipment/implants available, medications/allergies/relevent history reviewed, required imaging and test results available.  Performed  Maximum sterile technique was used including antiseptics, cap, gloves, gown, hand hygiene, mask and sheet. Skin prep: Chlorhexidine; local anesthetic administered A antimicrobial bonded/coated triple lumen catheter was placed in the right internal jugular vein using the Seldinger technique.  Evaluation Blood flow good Complications: No apparent complications Patient did tolerate procedure well. Chest X-ray ordered to verify placement.  CXR: pending.  U/S used in placement.  YACOUB,WESAM 09/18/2012, 1:27 PM

## 2012-09-19 DIAGNOSIS — D649 Anemia, unspecified: Secondary | ICD-10-CM

## 2012-09-19 DIAGNOSIS — I517 Cardiomegaly: Secondary | ICD-10-CM

## 2012-09-19 LAB — COMPREHENSIVE METABOLIC PANEL
ALT: 7 U/L (ref 0–35)
Alkaline Phosphatase: 117 U/L (ref 39–117)
BUN: 22 mg/dL (ref 6–23)
Chloride: 104 mEq/L (ref 96–112)
GFR calc Af Amer: >90 mL/min (ref >90)
Glucose, Bld: 113 mg/dL — ABNORMAL HIGH (ref 70–99)
Potassium: 3.8 mEq/L (ref 3.5–5.1)
Total Bilirubin: 0.1 mg/dL — ABNORMAL LOW (ref 0.3–1.2)

## 2012-09-19 LAB — CBC WITH DIFFERENTIAL/PLATELET
Basophils Absolute: 0 10*3/uL (ref 0.0–0.1)
Basophils Relative: 0 % (ref 0–1)
Eosinophils Relative: 0 % (ref 0–5)
Lymphocytes Relative: 7 % — ABNORMAL LOW (ref 12–46)
MCHC: 30.8 g/dL (ref 30.0–36.0)
MCV: 91.1 fL (ref 78.0–100.0)
Neutro Abs: 6.7 10*3/uL (ref 1.7–7.7)
Platelets: 133 10*3/uL — ABNORMAL LOW (ref 150–400)
RDW: 18.5 % — ABNORMAL HIGH (ref 11.5–15.5)
WBC: 8.1 10*3/uL (ref 4.0–10.5)

## 2012-09-19 LAB — BLOOD GAS, ARTERIAL
Acid-Base Excess: 0.7 mmol/L (ref 0.0–2.0)
Bicarbonate: 25.2 mEq/L — ABNORMAL HIGH (ref 20.0–24.0)
FIO2: 0.4 %
MECHVT: 360 mL
TCO2: 26.6 mmol/L (ref 0–100)
pCO2 arterial: 44 mmHg (ref 35.0–45.0)
pH, Arterial: 7.376 (ref 7.350–7.450)

## 2012-09-19 LAB — URINE CULTURE

## 2012-09-19 LAB — GLUCOSE, CAPILLARY: Glucose-Capillary: 121 mg/dL — ABNORMAL HIGH (ref 70–99)

## 2012-09-19 LAB — LEGIONELLA ANTIGEN, URINE: Legionella Antigen, Urine: NEGATIVE

## 2012-09-19 LAB — PROCALCITONIN: Procalcitonin: 5.98 ng/mL

## 2012-09-19 MED ORDER — FENTANYL CITRATE 0.05 MG/ML IJ SOLN
25.0000 ug | INTRAMUSCULAR | Status: DC | PRN
  Administered 2012-09-20 (×2): 50 ug via INTRAVENOUS
  Filled 2012-09-19 (×2): qty 2

## 2012-09-19 MED ORDER — OSMOLITE 1.2 CAL PO LIQD
1000.0000 mL | ORAL | Status: DC
  Filled 2012-09-19 (×2): qty 1000

## 2012-09-19 MED ORDER — MIDAZOLAM HCL 2 MG/2ML IJ SOLN
2.0000 mg | INTRAMUSCULAR | Status: DC | PRN
  Administered 2012-09-20: 2 mg via INTRAVENOUS
  Administered 2012-09-20: 4 mg via INTRAVENOUS
  Administered 2012-09-20 (×3): 2 mg via INTRAVENOUS
  Filled 2012-09-19 (×2): qty 2
  Filled 2012-09-19: qty 4
  Filled 2012-09-19 (×2): qty 2

## 2012-09-19 MED ORDER — PRO-STAT SUGAR FREE PO LIQD
30.0000 mL | Freq: Every day | ORAL | Status: DC
  Administered 2012-09-19 – 2012-09-20 (×2): 30 mL
  Filled 2012-09-19 (×3): qty 30

## 2012-09-19 MED ORDER — PANTOPRAZOLE SODIUM 40 MG PO PACK
40.0000 mg | PACK | Freq: Every day | ORAL | Status: DC
  Administered 2012-09-19 – 2012-09-20 (×2): 40 mg
  Filled 2012-09-19 (×3): qty 20

## 2012-09-19 MED ORDER — CHLORHEXIDINE GLUCONATE 0.12 % MT SOLN
15.0000 mL | Freq: Two times a day (BID) | OROMUCOSAL | Status: DC
  Administered 2012-09-19 – 2012-09-21 (×5): 15 mL via OROMUCOSAL
  Filled 2012-09-19 (×3): qty 15

## 2012-09-19 MED ORDER — METHYLPREDNISOLONE SODIUM SUCC 40 MG IJ SOLR
40.0000 mg | Freq: Four times a day (QID) | INTRAMUSCULAR | Status: DC
Start: 2012-09-19 — End: 2012-09-20
  Administered 2012-09-19 – 2012-09-20 (×5): 40 mg via INTRAVENOUS
  Filled 2012-09-19 (×9): qty 1

## 2012-09-19 MED ORDER — FUROSEMIDE 10 MG/ML IJ SOLN
40.0000 mg | Freq: Two times a day (BID) | INTRAMUSCULAR | Status: DC
  Administered 2012-09-19 – 2012-09-20 (×3): 40 mg via INTRAVENOUS
  Filled 2012-09-19 (×4): qty 4

## 2012-09-19 MED ORDER — ALBUTEROL SULFATE (5 MG/ML) 0.5% IN NEBU
2.5000 mg | INHALATION_SOLUTION | Freq: Four times a day (QID) | RESPIRATORY_TRACT | Status: DC
  Administered 2012-09-19 – 2012-09-22 (×12): 2.5 mg via RESPIRATORY_TRACT
  Filled 2012-09-19 (×12): qty 0.5

## 2012-09-19 MED ORDER — INSULIN ASPART 100 UNIT/ML ~~LOC~~ SOLN
0.0000 [IU] | SUBCUTANEOUS | Status: DC
  Administered 2012-09-19: 5 [IU] via SUBCUTANEOUS
  Administered 2012-09-19 (×3): 2 [IU] via SUBCUTANEOUS
  Administered 2012-09-20: 5 [IU] via SUBCUTANEOUS
  Administered 2012-09-20 (×3): 3 [IU] via SUBCUTANEOUS
  Administered 2012-09-20 (×2): 2 [IU] via SUBCUTANEOUS
  Administered 2012-09-21: 3 [IU] via SUBCUTANEOUS
  Administered 2012-09-21 (×2): 2 [IU] via SUBCUTANEOUS

## 2012-09-19 MED ORDER — DEXMEDETOMIDINE HCL IN NACL 200 MCG/50ML IV SOLN
0.0000 ug/kg/h | INTRAVENOUS | Status: DC
  Administered 2012-09-19: 0.044 ug/kg/h via INTRAVENOUS
  Administered 2012-09-19: 1 ug/kg/h via INTRAVENOUS
  Administered 2012-09-19: 0.061 ug/kg/h via INTRAVENOUS
  Administered 2012-09-20 (×4): 1.2 ug/kg/h via INTRAVENOUS
  Administered 2012-09-20: 0.7 ug/kg/h via INTRAVENOUS
  Administered 2012-09-21 (×3): 1.2 ug/kg/h via INTRAVENOUS
  Filled 2012-09-19 (×10): qty 50

## 2012-09-19 MED ORDER — BIOTENE DRY MOUTH MT LIQD
15.0000 mL | Freq: Four times a day (QID) | OROMUCOSAL | Status: DC
Start: 2012-09-19 — End: 2012-09-21
  Administered 2012-09-19 – 2012-09-21 (×10): 15 mL via OROMUCOSAL

## 2012-09-19 MED ORDER — OSMOLITE 1.2 CAL PO LIQD
1000.0000 mL | ORAL | Status: DC
  Administered 2012-09-19 – 2012-09-20 (×2): 1000 mL
  Filled 2012-09-19 (×4): qty 1000

## 2012-09-19 NOTE — Consult Note (Signed)
PULMONARY  / CRITICAL CARE MEDICINE  Name: Karina Weber MRN: [id number] DOB: 27-Oct-1955    LOS: 1  REFERRING MD :  Aundria Rud   CHIEF COMPLAINT:  Respiratory failure   BRIEF PATIENT DESCRIPTION:  56 year old female active smoker w/ chronic resp failure on basis of COPD and medical non-compliance. Admitted on 12/17 for acute respiratory failure.   LINES / TUBES: OETT 12/17>>> Right IJ CVL 12/17>>>  CULTURES: Sputum 12/17>>> U strep 12/17>>> U legionella 12/17>>> Influenza swab 12/17>>>  ANTIBIOTICS: levaquin 12/17>>> Rocephin 12/17>>>  SIGNIFICANT EVENTS:  12/170 ett placed, agonal  LEVEL OF CARE:  ICU PRIMARY SERVICE:  PCCM CONSULTANTS:   CODE STATUS: full DIET: 12/17>>>  DVT Px: LMWH GI Px:  PPI   VITAL SIGNS: Temp:  [97.9 F (36.6 C)-99.6 F (37.6 C)] 99.6 F (37.6 C) (12/18 0847) Pulse Rate:  [61-131] 104  (12/18 0800) Resp:  [9-30] 24  (12/18 0800) BP: (71-174)/(47-92) 136/64 mmHg (12/18 0800) SpO2:  [87 %-100 %] 98 % (12/18 0800) Arterial Line BP: (96-143)/(48-68) 131/61 mmHg (12/18 0600) FiO2 (%):  [40 %-100 %] 40 % (12/18 0800) Weight:  [45.7 kg (100 lb 12 oz)] 45.7 kg (100 lb 12 oz) (12/18 0200) HEMODYNAMICS:   VENTILATOR SETTINGS: Vent Mode:  [-] PRVC FiO2 (%):  [40 %-100 %] 40 % Set Rate:  [24 bmp] 24 bmp Vt Set:  [360 mL] 360 mL PEEP:  [5 cmH20] 5 cmH20 Plateau Pressure:  [19 cmH20-24 cmH20] 21 cmH20 INTAKE / OUTPUT: Intake/Output      12/17 0701 - 12/18 0700 12/18 0701 - 12/19 0700   I.V. (mL/kg) 1860 (40.7)    Other 40    NG/GT 140    IV Piggyback 50    Total Intake(mL/kg) 2090 (45.7)    Urine (mL/kg/hr) 310 (0.3)    Emesis/NG output 150    Total Output 460    Net +1630           PHYSICAL EXAMINATION: General:  Chronically ill appearing AAF Neuro:  Sedated on vent, rass -1, follows commands HEENT:  Poor dentition, no JVD, orally intubated, right IJ intact  Cardiovascular:  S1 s2 RRR no r Lungs:  Wheexing, ronchi  diffuse Abdomen:  Soft, non-tender  Musculoskeletal:  Intact  Skin:  Intact    LABS: Cbc  Lab 09/19/12 0437 09/18/12 1214 09/18/12 0733  WBC 8.1 -- --  HGB 11.4* 11.7* 14.8  HCT 37.0 38.3 48.6*  PLT 133* 113* 145*    Chemistry   Lab 09/19/12 0437 09/18/12 1214 09/18/12 0733  NA 139 141 138  K 3.8 3.7 4.6  CL 104 103 94*  CO2 23 26 27   BUN 22 17 14   CREATININE 0.63 0.58 0.49*  CALCIUM 9.2 8.3* 10.8*  MG -- -- --  PHOS -- -- --  GLUCOSE 113* 148* 127*    Liver fxn  Lab 09/19/12 0437  AST 10  ALT 7  ALKPHOS 117  BILITOT 0.1*  PROT 6.5  ALBUMIN 2.4*   coags No results found for this basename: APTT:3,INR:3 in the last 168 hours Sepsis markers  Lab 09/19/12 0437 09/18/12 1214  LATICACIDVEN -- 0.9  PROCALCITON 5.98 3.59   Cardiac markers  Lab 09/19/12 0437 09/18/12 2016 09/18/12 1214  CKTOTAL -- -- --  CKMB -- -- --  TROPONINI <0.30 <0.30 <0.30   BNP  Lab 09/18/12 1214  PROBNP 3321.0*   ABG  Lab 09/19/12 0340 09/18/12 1440 09/18/12 1120  PHART 7.376 7.314* 7.043*  PCO2ART 44.0 51.7* 122.0*  PO2ART 72.9* 140.0* 106.0*  HCO3 25.2* 26.4* 31.6*  TCO2 26.6 28 35.3    CBG trend  Lab 09/19/12 0751 09/19/12 0353 09/18/12 2329 09/18/12 1946 09/18/12 1540  GLUCAP 103* 101* 121* 129* 131*    IMAGING: pcxr 12/18>>> likely chronci int changes, some fluid fissure?, ett wnl  ECG: SVT vs ST, no defined st changes  DIAGNOSES: Principal Problem:  *COPD with acute exacerbation Active Problems:  TOBACCO USE  ESSENTIAL HYPERTENSION  Severe chronic obstructive pulmonary disease  Substance abuse  New onset a-fib  Acute respiratory failure   ASSESSMENT / PLAN:  PULMONARY  ASSESSMENT: Acute on chronic respiratory failure in setting of AECOPD Pulmonary infiltrates.  Suspect this is primarily AECOPD in setting of non-compliance +/- cocaine abuse. CXR could reflect some degree of edema.  PLAN:   Avoid elevated rates with copd, reduce rate 18 -on  min O 2needs, assess cpap5 ps 5, likely to need ps increase abg in am pcxr in am Consider neg balance, lasix BDers Add solumedral  CARDIOVASCULAR  ASSESSMENT: sinus tach, no evidence ischemia, r/o cardiomyoapthy PLAN:  Tele Add lasix Avoid beta blockers if able Asa Echo assessment  RENAL  ASSESSMENT:   R/o pulm edema PLAN:   kvo Lasix Chem in am   GASTROINTESTINAL  ASSESSMENT:   malnurished PLAN:   ppi lft again in am  statr T F  HEMATOLOGIC Mild thrombocytopenia  ASSESSMENT:    PLAN:  Continue sub q heparin F/u cbc in am   INFECTIOUS  ASSESSMENT:   AECOPD/ purulent bronchitis  No defined PNA PLAN:   Continue current abx Pct algo will help dc abx, re ass 3 days follow sputum  ENDOCRINE  ASSESSMENT:  Mild hyperglycemia  Steroid induced.  PLAN:   ssi protocol   NEUROLOGIC  ASSESSMENT:   Acute encephalopathy  In setting of hypercarbia and hypoxia  PLAN: Sedation protocol, versed required Watch for evidence of w/d Would prefer precedex and int meds if able esnure thiamine, folic acid, MVI  CLINICAL SUMMARY:  56 year old female active smoker and polysubs abuse w/ pmh of medical [place]. Presents again with AECOPD, required intubation for progressive resp failure. We will treat aggressively for BDs, systemic steroids, empiric abx. Unlikely pNA, assess echo, lasix, SBt start today  I have personally obtained a history, examined the patient, evaluated laboratory and imaging results, formulated the assessment and plan and placed orders. CRITICAL CARE: The patient is critically ill with multiple organ systems failure and requires high complexity decision making for assessment and support, frequent evaluation and titration of therapies, application of advanced monitoring technologies and extensive interpretation of multiple databases. Critical Care Time devoted to patient care services described in this note is 30 minutes.    Pulmonary and Critical Care  Medicine [facility] Pager: [phone]  09/19/2012, 10:13 AM

## 2012-09-19 NOTE — Progress Notes (Signed)
eLink Physician-Brief Progress Note Patient Name: Karina Weber DOB: January 11, 1956 MRN: [id number]  Date of Service  09/19/2012   HPI/Events of Note  Request from RT vent orders.  Review of orders also show no SSI coverage with patient on steroids for shock.   eICU Interventions  Plan: Vent orders placed SSI coverage q4 hours - moderate scale   Intervention Category Minor Interventions: Routine modifications to care plan (e.g. PRN medications for pain, fever)  Kody Vigil 09/19/2012, 12:26 AM

## 2012-09-19 NOTE — Progress Notes (Signed)
INITIAL NUTRITION ASSESSMENT  DOCUMENTATION CODES Per approved criteria  -Not Applicable   INTERVENTION:  Initiate TF via OG tube with Osmolite 1.2 at 20 ml/h, increase by 10 ml every 4 hours to goal rate of 40 ml/h with Prostat 30 ml once daily to provide 1252 kcals, 68 gm protein, 787 ml free water daily.  NUTRITION DIAGNOSIS: Inadequate oral intake related to inability to eat as evidenced by NPO status.   Goal: Intake to meet >90% of estimated nutrition needs.  Monitor:  TF tolerance/adequacy, weight trend, labs, I/O  Reason for Assessment: MD Consult for TF initiation and management.  56 y.o. female  Admitting Dx: COPD with acute exacerbation  ASSESSMENT: Patient was admitted for acute respiratory failure, history of COPD and medical non-compliance.  Patient with 6% weight loss in the past 2 months.  Appears well-nourished; no muscle or fat depletion noted.  Patient is currently intubated on ventilator support.  MV: 8.6 Temp:Temp (24hrs), Avg:98.8 F (37.1 C), Min:97.9 F (36.6 C), Max:99.6 F (37.6 C)   Height: Ht Readings from Last 1 Encounters:  09/18/12 5' (1.524 m)    Weight: Wt Readings from Last 1 Encounters:  09/19/12 100 lb 12 oz (45.7 kg)    Ideal Body Weight: 45.5 kg  % Ideal Body Weight: 100%  Wt Readings from Last 10 Encounters:  09/19/12 100 lb 12 oz (45.7 kg)  07/23/12 106 lb 12.8 oz (48.444 kg)  07/20/12 104 lb 6.4 oz (47.356 kg)  07/12/12 101 lb 6.4 oz (45.995 kg)  06/04/12 168 lb 3.4 oz (76.3 kg)  04/23/12 99 lb 11.2 oz (45.224 kg)  04/17/12 104 lb 8 oz (47.4 kg)  12/28/11 94 lb 2.2 oz (42.7 kg)  12/02/11 92 lb 11.2 oz (42.048 kg)  05/20/11 95 lb (43.092 kg)    Usual Body Weight: 101-106 lb  % Usual Body Weight: 94%  BMI:  Body mass index is 19.68 kg/(m^2).  Estimated Nutritional Needs: Kcal: 1275 Protein: 65-75 gm Fluid: 1.3-1.4 L  Skin: no problems noted  Diet Order:  NPO  EDUCATION NEEDS: -Education not  appropriate at this time   Intake/Output Summary (Last 24 hours) at 09/19/12 1035 Last data filed at 09/19/12 0600  Gross per 24 hour  Intake   2090 ml  Output    460 ml  Net   1630 ml    Last BM: unknown   Labs:   Lab 09/19/12 0437 09/18/12 1214 09/18/12 0733  NA 139 141 138  K 3.8 3.7 4.6  CL 104 103 94*  CO2 23 26 27   BUN 22 17 14   CREATININE 0.63 0.58 0.49*  CALCIUM 9.2 8.3* 10.8*  MG -- -- --  PHOS -- -- --  GLUCOSE 113* 148* 127*    CBG (last 3)   Basename 09/19/12 0751 09/19/12 0353 09/18/12 2329  GLUCAP 103* 101* 121*    Scheduled Meds:   . albuterol  2.5 mg Nebulization Q6H  . antiseptic oral rinse  15 mL Mouth Rinse QID  . aspirin  81 mg Oral Daily  . cefTRIAXone (ROCEPHIN)  IV  1 g Intravenous Q24H  . chlorhexidine  15 mL Mouth Rinse BID  . enoxaparin (LOVENOX) injection  30 mg Subcutaneous Q24H  . ferrous sulfate  300 mg Per Tube BID WC  . furosemide  40 mg Intravenous Q12H  . insulin aspart  0-15 Units Subcutaneous Q4H  . ipratropium  0.5 mg Nebulization Q6H  . levofloxacin  750 mg Intravenous Q24H  . methylPREDNISolone (  SOLU-MEDROL) injection  40 mg Intravenous Q6H  . pantoprazole sodium  40 mg Per Tube QHS    Continuous Infusions:   . sodium chloride 100 mL/hr at 09/19/12 0200  . dexmedetomidine    . feeding supplement (OSMOLITE 1.2 CAL)    . fentaNYL infusion INTRAVENOUS 100 mcg/hr (09/18/12 2000)  . midazolam (VERSED) infusion 2 mg/hr (09/19/12 0142)    Past Medical History  Diagnosis Date  . Tobacco abuse   . COPD (chronic obstructive pulmonary disease)     exacerbagtion in 6/12 and in 2010.   Marland Kitchen Hypertension   . History of radiation therapy 07/19/10 to 07/28/10    RUL lung  . 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 Alcohol abuse     2 beers a day  . Asthma   . Headache   . Chronic hypercapnic respiratory failure   . Polysubstance abuse     Past Surgical History  Procedure Date  . No past surgeries     Joaquin Courts, RD, LDN, CNSC Pager# [phone] After Hours Pager# [phone]

## 2012-09-19 NOTE — Progress Notes (Signed)
  Echocardiogram 2D Echocardiogram has been performed.  Karina Weber 09/19/2012, 12:12 PM

## 2012-09-19 NOTE — Progress Notes (Signed)
IM Attending  This is a general medicine attending note to acknowledge Dr. Eliane Decree admission note.  57 woman with severe COPD admitted for respiratory failure.  Shortly after admission she developed critical respiratory acidosis and was intubated.  She is under the care of the PCCM Service.  We will be glad to accept her in transfer when critical illness has stabilized.

## 2012-09-20 ENCOUNTER — Inpatient Hospital Stay (HOSPITAL_COMMUNITY): Payer: Medicare Other

## 2012-09-20 LAB — CBC WITH DIFFERENTIAL/PLATELET
Basophils Absolute: 0 10*3/uL (ref 0.0–0.1)
Eosinophils Absolute: 0 10*3/uL (ref 0.0–0.7)
Eosinophils Relative: 0 % (ref 0–5)
HCT: 39.8 % (ref 36.0–46.0)
Lymphocytes Relative: 5 % — ABNORMAL LOW (ref 12–46)
MCH: 28.3 pg (ref 26.0–34.0)
MCV: 89.4 fL (ref 78.0–100.0)
Monocytes Absolute: 0.4 10*3/uL (ref 0.1–1.0)
RDW: 18.4 % — ABNORMAL HIGH (ref 11.5–15.5)
WBC: 7.9 10*3/uL (ref 4.0–10.5)

## 2012-09-20 LAB — GLUCOSE, CAPILLARY
Glucose-Capillary: 169 mg/dL — ABNORMAL HIGH (ref 70–99)
Glucose-Capillary: 157 mg/dL — ABNORMAL HIGH (ref 70–99)
Glucose-Capillary: 126 mg/dL — ABNORMAL HIGH (ref 70–99)

## 2012-09-20 LAB — POCT I-STAT 3, ART BLOOD GAS (G3+)
Bicarbonate: 38.7 mEq/L — ABNORMAL HIGH (ref 20.0–24.0)
Bicarbonate: 39.3 mEq/L — ABNORMAL HIGH (ref 20.0–24.0)
Patient temperature: 98.6
pH, Arterial: 7.456 — ABNORMAL HIGH (ref 7.350–7.450)
pH, Arterial: 7.495 — ABNORMAL HIGH (ref 7.350–7.450)
pO2, Arterial: 74 mmHg — ABNORMAL LOW (ref 80.0–100.0)
pO2, Arterial: 64 mmHg — ABNORMAL LOW (ref 80.0–100.0)

## 2012-09-20 LAB — COMPREHENSIVE METABOLIC PANEL
AST: 11 U/L (ref 0–37)
CO2: 32 mEq/L (ref 19–32)
Calcium: 9.2 mg/dL (ref 8.4–10.5)
Creatinine, Ser: 0.58 mg/dL (ref 0.50–1.10)
GFR calc Af Amer: >90 mL/min (ref >90)
GFR calc non Af Amer: >90 mL/min (ref >90)
Glucose, Bld: 209 mg/dL — ABNORMAL HIGH (ref 70–99)
Total Protein: 6.9 g/dL (ref 6.0–8.3)

## 2012-09-20 LAB — BLOOD GAS, ARTERIAL
Bicarbonate: 32.2 mEq/L — ABNORMAL HIGH (ref 20.0–24.0)
O2 Saturation: 95.2 %
Patient temperature: 98.6
TCO2: 33.7 mmol/L (ref 0–100)

## 2012-09-20 MED ORDER — DOCUSATE SODIUM 50 MG/5ML PO LIQD
100.0000 mg | Freq: Two times a day (BID) | ORAL | Status: DC
  Administered 2012-09-20 – 2012-09-21 (×3): 100 mg via ORAL
  Filled 2012-09-20 (×4): qty 10

## 2012-09-20 MED ORDER — FUROSEMIDE 10 MG/ML IJ SOLN
20.0000 mg | Freq: Every day | INTRAMUSCULAR | Status: DC
  Filled 2012-09-20: qty 2

## 2012-09-20 MED ORDER — METHYLPREDNISOLONE SODIUM SUCC 40 MG IJ SOLR
40.0000 mg | Freq: Two times a day (BID) | INTRAMUSCULAR | Status: DC
  Administered 2012-09-20: 40 mg via INTRAVENOUS
  Filled 2012-09-20 (×3): qty 1

## 2012-09-20 NOTE — Progress Notes (Signed)
Visited with patient briefly. She isn't able to verbally communicate and did not appear to want company. The nurse confirmed that and recommended a follow up visit in a few days when she is able to speak.  [name]

## 2012-09-20 NOTE — Progress Notes (Signed)
Inpatient Diabetes Program Recommendations  AACE/ADA: New Consensus Statement on Inpatient Glycemic Control (2013)  Target Ranges:  Prepandial:   less than 140 mg/dL      Peak postprandial:   less than 180 mg/dL (1-2 hours)      Critically ill patients:  140 - 180 mg/dL   Results for Karina Weber, Karina Weber (MRN [id number]) as of 09/20/2012 11:17  Ref. Range 09/19/2012 03:53 09/19/2012 07:51 09/19/2012 12:20 09/19/2012 15:44 09/19/2012 20:21 09/19/2012 23:50 09/20/2012 03:57  Glucose-Capillary Latest Range: 70-99 mg/dL 086 (H) 578 (H) 469 (H) 138 (H) 218 (H) 184 (H) 169 (H)   Inpatient Diabetes Program Recommendations Insulin - Meal Coverage: Please consider Novolog 3 units tube feeding coverage Q4H while patient is on the ventilator and receving tube feeding.  Note: Patient does not have a history of diabetes.  Blood glucose has increasing since starting on steroids and tube feedings.  Please consider adding Novolog 3 units Q4H for tube feeding coverage while the patient is on the ventilator and receiving tube feedings.  Will continue to follow.  Thanks, Orlando Penner, RN, BSN, CCRN Diabetes Coordinator Inpatient Diabetes Program [phone]

## 2012-09-20 NOTE — Progress Notes (Signed)
PULMONARY  / CRITICAL CARE MEDICINE  Name: Karina Weber MRN: [id number] DOB: 1956-09-08    LOS: 2  REFERRING MD :  Aundria Rud   CHIEF COMPLAINT:  Respiratory failure   BRIEF PATIENT DESCRIPTION:  56 year old female active smoker w/ chronic resp failure on basis of COPD and medical non-compliance. Admitted on 12/17 for acute respiratory failure.   LINES / TUBES: OETT 12/17>>> Right IJ CVL 12/17>>> Rad a line 12/17>>>12/19  CULTURES: Sputum 12/17>>> U strep 12/17>>> U legionella 12/17>>> Influenza swab 12/17>>>  ANTIBIOTICS: levaquin 12/17>>> Rocephin 12/17>>>12/19  SIGNIFICANT EVENTS:  12/17- ett 12/18 echo - 65%, rv mildly dil, ivc dilated, pa 39 Neg balance 1.6 liters  LEVEL OF CARE:  ICU PRIMARY SERVICE:  PCCM CONSULTANTS:   CODE STATUS: full DIET: 12/17>>>  DVT Px: LMWH GI Px:  PPI   VITAL SIGNS: Temp:  [97.4 F (36.3 C)-99.5 F (37.5 C)] 97.4 F (36.3 C) (12/19 1154) Pulse Rate:  [68-108] 94  (12/19 1230) Resp:  [24-34] 27  (12/19 1230) BP: (136-207)/(71-92) 136/71 mmHg (12/19 1230) SpO2:  [93 %-100 %] 94 % (12/19 1230) Arterial Line BP: (136-202)/(64-86) 162/77 mmHg (12/19 1100) FiO2 (%):  [40 %] 40 % (12/19 1230) HEMODYNAMICS:   VENTILATOR SETTINGS: Vent Mode:  [-] PRVC FiO2 (%):  [40 %] 40 % Set Rate:  [24 bmp] 24 bmp Vt Set:  [360 mL] 360 mL PEEP:  [5 cmH20] 5 cmH20 Pressure Support:  [12 cmH20-14 cmH20] 12 cmH20 Plateau Pressure:  [11 cmH20-24 cmH20] 18 cmH20 INTAKE / OUTPUT: Intake/Output      12/18 0701 - 12/19 0700 12/19 0701 - 12/20 0700   I.V. (mL/kg) 2397.3 (52.5) 300.6 (6.6)   Other     NG/GT 510 220   IV Piggyback 300 154   Total Intake(mL/kg) 3207.3 (70.2) 674.6 (14.8)   Urine (mL/kg/hr) 4900 (4.5) 525 (1.9)   Emesis/NG output 200    Total Output 5100 525   Net -1892.7 +149.6          PHYSICAL EXAMINATION: General:  Chronically ill appearing AAF Neuro:  Sedated on vent, rass 0, follows commands HEENT:  Poor  dentition,lower JVD Cardiovascular:  S1 s2 RRR no r Lungs:  No wheezing, yes ronchi Abdomen:  Soft, non-tender  Musculoskeletal:  Intact  Skin:  Intact    LABS: Cbc  Lab 09/20/12 0500 09/19/12 0437 09/18/12 1214  WBC 7.9 -- --  HGB 12.6 11.4* 11.7*  HCT 39.8 37.0 38.3  PLT 173 133* 113*    Chemistry   Lab 09/20/12 0500 09/19/12 0437 09/18/12 1214  NA 140 139 141  K 3.5 3.8 3.7  CL 96 104 103  CO2 32 23 26  BUN 27* 22 17  CREATININE 0.58 0.63 0.58  CALCIUM 9.2 9.2 8.3*  MG -- -- --  PHOS -- -- --  GLUCOSE 209* 113* 148*    Liver fxn  Lab 09/20/12 0500 09/19/12 0437  AST 11 10  ALT 8 7  ALKPHOS 117 117  BILITOT 0.2* 0.1*  PROT 6.9 6.5  ALBUMIN 2.7* 2.4*   coags No results found for this basename: APTT:3,INR:3 in the last 168 hours Sepsis markers  Lab 09/20/12 0500 09/19/12 1553 09/19/12 0437 09/18/12 1214  LATICACIDVEN -- -- -- 0.9  PROCALCITON 3.25 5.33 5.98 --   Cardiac markers  Lab 09/19/12 0437 09/18/12 2016 09/18/12 1214  CKTOTAL -- -- --  CKMB -- -- --  TROPONINI <0.30 <0.30 <0.30   BNP  Lab 09/18/12 1214  PROBNP 3321.0*   ABG  Lab 09/20/12 0500 09/19/12 0340 09/18/12 1440  PHART 7.435 7.376 7.314*  PCO2ART 48.8* 44.0 51.7*  PO2ART 78.3* 72.9* 140.0*  HCO3 32.2* 25.2* 26.4*  TCO2 33.7 26.6 28    CBG trend  Lab 09/20/12 1153 09/20/12 0357 09/19/12 2350 09/19/12 2021 09/19/12 1544  GLUCAP 139* 169* 184* 218* 138*    IMAGING: pcxr 12/19>>> likely chronci int changes, resolved hilar prominence  ECG: SVT vs ST, no defined st changes  DIAGNOSES: Principal Problem:  *COPD with acute exacerbation Active Problems:  TOBACCO USE  ESSENTIAL HYPERTENSION  Severe chronic obstructive pulmonary disease  Substance abuse  New onset a-fib  Acute respiratory failure   ASSESSMENT / PLAN:  PULMONARY  ASSESSMENT: Acute on chronic respiratory failure in setting of AECOPD Pulmonary infiltrates.  Suspect this is primarily AECOPD in  setting of non-compliance +/- cocaine abuse. CXR could reflect some degree of edema.  PLAN:   cpap 5 ps 5, goal 2 hrs--> failed Need secretions to improve as well Need to reduce agitation for airway protection May be a candidate for extubation to NIMV with copd pcxr in am Reduce lasix BDers Reduce solumedral  CARDIOVASCULAR  ASSESSMENT: sinus tach, no evidence ischemia, r/o cardiomyoapthy PLAN:  Tele Lasix reduction Echo reviewed, ivc noted, in setting only mild PA htn  RENAL  ASSESSMENT:   R/o pulm edema PLAN:   Reduce lasix kvo Chem in am   GASTROINTESTINAL  ASSESSMENT:   malnurished PLAN:   ppi T Fat goal Need a BM, add colace, may need cult supp  HEMATOLOGIC Mild thrombocytopenia  ASSESSMENT:    PLAN:  Continue sub q heparin  INFECTIOUS  ASSESSMENT:   AECOPD/ purulent bronchitis  No defined PNA PLAN:   Remains culture neg, NO PNA, dc ceftriaxone Treat bronchitis, levo, add stop date  ENDOCRINE  ASSESSMENT:  Mild hyperglycemia  Steroid induced. Controlled Reducing roids  PLAN:   ssi protocol   NEUROLOGIC  ASSESSMENT:   Acute encephalopathy   PLAN: Precedex, escalate to max 1.8 fent May need change to propofol  CLINICAL SUMMARY:  56 year old female active smoker and polysubs abuse w/ pmh of medical [place]. Presents again with AECOPD, required intubation for progressive resp failure. We will treat aggressively for BDs, systemic steroids, empiric abx narrow, wean, control agitation, lasix reduction  I have personally obtained a history, examined the patient, evaluated laboratory and imaging results, formulated the assessment and plan and placed orders. CRITICAL CARE: The patient is critically ill with multiple organ systems failure and requires high complexity decision making for assessment and support, frequent evaluation and titration of therapies, application of advanced monitoring technologies and extensive interpretation of multiple  databases. Critical Care Time devoted to patient care services described in this note is 30 minutes.   Mcarthur Rossetti. Tyson Alias, MD, FACP Pgr: [phone] [place]  Pulmonary and Critical Care Medicine [facility] Pager: [phone]  09/20/2012, 1:10 PM

## 2012-09-21 ENCOUNTER — Inpatient Hospital Stay (HOSPITAL_COMMUNITY): Payer: Medicare Other

## 2012-09-21 DIAGNOSIS — I4891 Unspecified atrial fibrillation: Secondary | ICD-10-CM

## 2012-09-21 DIAGNOSIS — F17200 Nicotine dependence, unspecified, uncomplicated: Secondary | ICD-10-CM

## 2012-09-21 DIAGNOSIS — J9600 Acute respiratory failure, unspecified whether with hypoxia or hypercapnia: Secondary | ICD-10-CM

## 2012-09-21 LAB — BASIC METABOLIC PANEL
CO2: 35 mEq/L — ABNORMAL HIGH (ref 19–32)
GFR calc non Af Amer: >90 mL/min (ref >90)
Glucose, Bld: 146 mg/dL — ABNORMAL HIGH (ref 70–99)
Potassium: 3 mEq/L — ABNORMAL LOW (ref 3.5–5.1)
Sodium: 141 mEq/L (ref 135–145)

## 2012-09-21 LAB — GLUCOSE, CAPILLARY
Glucose-Capillary: 131 mg/dL — ABNORMAL HIGH (ref 70–99)
Glucose-Capillary: 72 mg/dL (ref 70–99)
Glucose-Capillary: 89 mg/dL (ref 70–99)

## 2012-09-21 MED ORDER — PANTOPRAZOLE SODIUM 40 MG PO TBEC
40.0000 mg | DELAYED_RELEASE_TABLET | Freq: Every day | ORAL | Status: DC
  Administered 2012-09-21: 40 mg via ORAL
  Filled 2012-09-21: qty 1

## 2012-09-21 MED ORDER — FENTANYL CITRATE 0.05 MG/ML IJ SOLN
25.0000 ug | INTRAMUSCULAR | Status: DC | PRN
  Administered 2012-09-21: 25 ug via INTRAVENOUS
  Filled 2012-09-21: qty 2

## 2012-09-21 MED ORDER — INSULIN ASPART 100 UNIT/ML ~~LOC~~ SOLN
0.0000 [IU] | Freq: Three times a day (TID) | SUBCUTANEOUS | Status: DC
  Administered 2012-09-21: 2 [IU] via SUBCUTANEOUS

## 2012-09-21 MED ORDER — DOCUSATE SODIUM 100 MG PO CAPS
100.0000 mg | ORAL_CAPSULE | Freq: Two times a day (BID) | ORAL | Status: DC
  Administered 2012-09-21 – 2012-09-24 (×4): 100 mg via ORAL
  Filled 2012-09-21 (×7): qty 1

## 2012-09-21 MED ORDER — HYDRALAZINE HCL 20 MG/ML IJ SOLN
10.0000 mg | INTRAMUSCULAR | Status: DC | PRN
  Administered 2012-09-21: 10 mg via INTRAVENOUS
  Filled 2012-09-21: qty 0.5

## 2012-09-21 MED ORDER — SODIUM CHLORIDE 0.9 % IV SOLN: INTRAVENOUS | Status: DC | PRN

## 2012-09-21 MED ORDER — INSULIN ASPART 100 UNIT/ML ~~LOC~~ SOLN: 3.0000 [IU] | Freq: Three times a day (TID) | SUBCUTANEOUS | Status: DC

## 2012-09-21 MED ORDER — METHYLPREDNISOLONE SODIUM SUCC 40 MG IJ SOLR
40.0000 mg | Freq: Every day | INTRAMUSCULAR | Status: AC
  Administered 2012-09-21 – 2012-09-22 (×2): 40 mg via INTRAVENOUS
  Filled 2012-09-21 (×2): qty 1

## 2012-09-21 MED ORDER — LEVOFLOXACIN IN D5W 750 MG/150ML IV SOLN
500.0000 mg | INTRAVENOUS | Status: DC
  Administered 2012-09-22 – 2012-09-23 (×2): 500 mg via INTRAVENOUS
  Filled 2012-09-21 (×3): qty 150

## 2012-09-21 MED ORDER — FERROUS SULFATE 325 (65 FE) MG PO TABS
325.0000 mg | ORAL_TABLET | Freq: Two times a day (BID) | ORAL | Status: DC
  Administered 2012-09-21 – 2012-09-24 (×6): 325 mg via ORAL
  Filled 2012-09-21 (×8): qty 1

## 2012-09-21 MED ORDER — BUDESONIDE 0.25 MG/2ML IN SUSP
0.2500 mg | Freq: Four times a day (QID) | RESPIRATORY_TRACT | Status: DC
  Administered 2012-09-21 – 2012-09-24 (×12): 0.25 mg via RESPIRATORY_TRACT
  Filled 2012-09-21 (×16): qty 2

## 2012-09-21 MED ORDER — POTASSIUM CHLORIDE 10 MEQ/50ML IV SOLN
10.0000 meq | INTRAVENOUS | Status: AC
  Administered 2012-09-21 (×4): 10 meq via INTRAVENOUS
  Filled 2012-09-21: qty 200

## 2012-09-21 MED ORDER — INSULIN ASPART 100 UNIT/ML ~~LOC~~ SOLN: 0.0000 [IU] | Freq: Every day | SUBCUTANEOUS | Status: DC

## 2012-09-21 MED ORDER — POTASSIUM CHLORIDE CRYS ER 20 MEQ PO TBCR
EXTENDED_RELEASE_TABLET | ORAL | Status: AC
  Filled 2012-09-21: qty 2

## 2012-09-21 NOTE — Progress Notes (Signed)
PULMONARY  / CRITICAL CARE MEDICINE  Name: Karina Weber MRN: [id number] DOB: Mar 16, 1956    LOS: 3  REFERRING MD :  Aundria Rud   CHIEF COMPLAINT:  Respiratory failure   BRIEF PATIENT DESCRIPTION:  56 year old female active smoker w/ chronic resp failure on basis of COPD and medical non-compliance. Admitted on 12/17 for acute respiratory failure.   LINES / TUBES: ETT 12/17>>>12/20 Right IJ CVL 12/17>>> Rad a line 12/17>>>12/19  CULTURES: Sputum 12/17>>>NOF U strep 12/17>>>NEG U legionella 12/17>>>NEG   ANTIBIOTICS: levaquin 12/17>>> Rocephin 12/17>>>12/19  SIGNIFICANT EVENTS:  12/18 echo - 65%, rv mildly dil, ivc dilated, pa 39  LEVEL OF CARE:  ICU PRIMARY SERVICE:  PCCM CONSULTANTS:   CODE STATUS: full DIET: Reg DVT Px: LMWH GI Px:  PPI    SUBJ: Passed SBT. Anxious. Extubated. Looks good post ext. Cognition appears intact  VITAL SIGNS: Temp:  [97.8 F (36.6 C)-98.6 F (37 C)] 97.9 F (36.6 C) (12/20 1245) Pulse Rate:  [58-101] 90  (12/20 1500) Resp:  [17-29] 22  (12/20 1500) BP: (90-191)/(60-116) 123/61 mmHg (12/20 1500) SpO2:  [93 %-100 %] 97 % (12/20 1500) Arterial Line BP: (134-187)/(67-95) 158/87 mmHg (12/20 0600) FiO2 (%):  [40 %] 40 % (12/20 0800) HEMODYNAMICS:   VENTILATOR SETTINGS: Vent Mode:  [-] CPAP FiO2 (%):  [40 %] 40 % Set Rate:  [10 bmp] 10 bmp Vt Set:  [360 mL] 360 mL PEEP:  [5 cmH20] 5 cmH20 Pressure Support:  [12 cmH20] 12 cmH20 Plateau Pressure:  [15 cmH20-22 cmH20] 22 cmH20 INTAKE / OUTPUT: Intake/Output      12/19 0701 - 12/20 0700 12/20 0701 - 12/21 0700   P.O.  360   I.V. (mL/kg) 879.9 (19.3) 86.1 (1.9)   NG/GT 1140 80   IV Piggyback 154 350   Total Intake(mL/kg) 2173.9 (47.6) 876.1 (19.2)   Urine (mL/kg/hr) 2450 (2.2) 310 (0.8)   Emesis/NG output     Total Output 2450 310   Net -276.1 +566.1          PHYSICAL EXAMINATION: General: Anxious, mild rest dyspnea Neuro:  No focal deficits HEENT:  Poor dentition,lower  JVD Cardiovascular: RRR s M Lungs:  Scattered wheezes Abdomen:  Soft, non-tender  Musculoskeletal:  Intact  Skin:  Intact    LABS: Cbc  Lab 09/20/12 0500 09/19/12 0437 09/18/12 1214  WBC 7.9 -- --  HGB 12.6 11.4* 11.7*  HCT 39.8 37.0 38.3  PLT 173 133* 113*    Chemistry   Lab 09/21/12 0500 09/20/12 0500 09/19/12 0437  NA 141 140 139  K 3.0* 3.5 3.8  CL 93* 96 104  CO2 35* 32 23  BUN 33* 27* 22  CREATININE 0.53 0.58 0.63  CALCIUM 9.0 9.2 9.2  MG -- -- --  PHOS -- -- --  GLUCOSE 146* 209* 113*    Liver fxn  Lab 09/20/12 0500 09/19/12 0437  AST 11 10  ALT 8 7  ALKPHOS 117 117  BILITOT 0.2* 0.1*  PROT 6.9 6.5  ALBUMIN 2.7* 2.4*   coags No results found for this basename: APTT:3,INR:3 in the last 168 hours Sepsis markers  Lab 09/21/12 0500 09/20/12 0500 09/19/12 1553 09/18/12 1214  LATICACIDVEN -- -- -- 0.9  PROCALCITON 1.25 3.25 5.33 --   Cardiac markers  Lab 09/19/12 0437 09/18/12 2016 09/18/12 1214  CKTOTAL -- -- --  CKMB -- -- --  TROPONINI <0.30 <0.30 <0.30   BNP  Lab 09/18/12 1214  PROBNP 3321.0*   ABG  Lab 09/20/12 1607 09/20/12 1435 09/20/12 0500  PHART 7.495* 7.456* 7.435  PCO2ART 51.0* 55.0* 48.8*  PO2ART 64.0* 74.0* 78.3*  HCO3 39.3* 38.7* 32.2*  TCO2 41 40 33.7    CBG trend  Lab 09/21/12 1237 09/21/12 0757 09/21/12 0334 09/21/12 0005 09/20/12 1953  GLUCAP 105* 131* 147* 162* 126*    IMAGING: CXR: NAD   DIAGNOSES: Principal Problem:  *COPD with acute exacerbation Active Problems:  TOBACCO USE  ESSENTIAL HYPERTENSION  Severe chronic obstructive pulmonary disease  Substance abuse  New onset a-fib  Acute respiratory failure Anxiety  PLAN: Extubated today - close post-extubation monitoring If tolerates extubation, begin diet and D/C Foley Cont BDs, abx Decrease systemic steroids Add nebulized steroids Needs extensive changes to her lifestyle - counseled If tolerates extubation well, can transfer out of ICU 12/21  AM  Should return to IMTS service  PCCM will cont as consultants  Will need Tele at least due to recent transient AF   CCM X 35 mins  Billy Fischer, MD ; [facility] service Mobile [phone].  After 5:30 PM or weekends, call [phone]

## 2012-09-21 NOTE — Procedures (Signed)
Extubation Procedure Note  Patient Details:   Name: Karina Weber DOB: 17-Apr-1956 MRN: [id number]   Airway Documentation:     Evaluation  O2 sats: stable throughout Complications: No apparent complications Patient did tolerate procedure well. Bilateral Breath Sounds: Rhonchi Suctioning: Airway Yes Meet all extubation requirements.  Newt Lukes 09/21/2012, 9:11 AM

## 2012-09-21 NOTE — Progress Notes (Signed)
Assisted pt to bathroom to void. On returning to bed, pt s' heart rate increased to 140-150 s' B/P 78/48 and 95/68. Dr Delford Field was informed of same.Presently, pt s' heart rate is in the  90's to 110's. We will continue to monitor.

## 2012-09-21 NOTE — Progress Notes (Signed)
eLink Physician-Brief Progress Note Patient Name: MILKA WINDHOLZ DOB: 01-17-1956 MRN: [id number]  Date of Service  09/21/2012   HPI/Events of Note  Patient with BP of 170 to 190s/75 (113) on precedex gtt with HR of 61   eICU Interventions  Plan: PRN hydralazine 10 mg IV q2 hours SBP greater than 170   Intervention Category Intermediate Interventions: Hypertension - evaluation and management  [place] Silveria 09/21/2012, 2:01 AM

## 2012-09-21 NOTE — Progress Notes (Signed)
Right radial arterial line removed per protocol. Vaseline gauze applied and pressure held for 10 min. Pt tolerated well. Will continue to monitor pt

## 2012-09-22 LAB — GLUCOSE, CAPILLARY
Glucose-Capillary: 113 mg/dL — ABNORMAL HIGH (ref 70–99)
Glucose-Capillary: 95 mg/dL (ref 70–99)
Glucose-Capillary: 162 mg/dL — ABNORMAL HIGH (ref 70–99)
Glucose-Capillary: 116 mg/dL — ABNORMAL HIGH (ref 70–99)
Glucose-Capillary: 84 mg/dL (ref 70–99)

## 2012-09-22 MED ORDER — METOPROLOL TARTRATE 1 MG/ML IV SOLN
INTRAVENOUS | Status: AC
  Administered 2012-09-22: 2.5 mg via INTRAVENOUS
  Filled 2012-09-22: qty 5

## 2012-09-22 MED ORDER — PREDNISONE 20 MG PO TABS
40.0000 mg | ORAL_TABLET | Freq: Every day | ORAL | Status: DC
  Administered 2012-09-23 – 2012-09-24 (×2): 40 mg via ORAL
  Filled 2012-09-22 (×4): qty 2

## 2012-09-22 MED ORDER — FENTANYL CITRATE 0.05 MG/ML IJ SOLN: 25.0000 ug | INTRAMUSCULAR | Status: DC | PRN

## 2012-09-22 MED ORDER — HYDROCODONE-ACETAMINOPHEN 5-325 MG PO TABS: 1.0000 | ORAL_TABLET | ORAL | Status: DC | PRN

## 2012-09-22 MED ORDER — METOPROLOL TARTRATE 1 MG/ML IV SOLN
2.5000 mg | Freq: Once | INTRAVENOUS | Status: AC
  Administered 2012-09-22: 2.5 mg via INTRAVENOUS

## 2012-09-22 MED ORDER — BISOPROLOL FUMARATE 5 MG PO TABS
5.0000 mg | ORAL_TABLET | Freq: Every day | ORAL | Status: DC
  Administered 2012-09-22 – 2012-09-24 (×3): 5 mg via ORAL
  Filled 2012-09-22 (×3): qty 1

## 2012-09-22 MED ORDER — LEVALBUTEROL HCL 0.63 MG/3ML IN NEBU
0.6300 mg | INHALATION_SOLUTION | Freq: Four times a day (QID) | RESPIRATORY_TRACT | Status: DC
  Administered 2012-09-22 – 2012-09-24 (×8): 0.63 mg via RESPIRATORY_TRACT
  Filled 2012-09-22 (×13): qty 3

## 2012-09-22 MED ORDER — POTASSIUM CHLORIDE CRYS ER 20 MEQ PO TBCR
40.0000 meq | EXTENDED_RELEASE_TABLET | Freq: Once | ORAL | Status: AC
  Administered 2012-09-22: 40 meq via ORAL
  Filled 2012-09-22: qty 2

## 2012-09-22 NOTE — Progress Notes (Signed)
PULMONARY  / CRITICAL CARE MEDICINE  Name: Karina Weber MRN: [id number] DOB: 19-Sep-1956    LOS: 4  REFERRING MD :  Aundria Rud   CHIEF COMPLAINT:  Respiratory failure   BRIEF PATIENT DESCRIPTION:  56 year old female active smoker w/ chronic resp failure on basis of COPD and medical non-compliance. Admitted on 12/17 for acute respiratory failure.   LINES / TUBES: ETT 12/17>>>12/20 Right IJ CVL 12/17>>> Rad a line 12/17>>>12/19  CULTURES: Sputum 12/17>>>NOF U strep 12/17>>>NEG U legionella 12/17>>>NEG   ANTIBIOTICS: levaquin 12/17>>> Rocephin 12/17>>>12/19  SIGNIFICANT EVENTS:  12/18 echo - 65%, rv mildly dil, ivc dilated, pa 39  LEVEL OF CARE:  ICU PRIMARY SERVICE:  PCCM CONSULTANTS:   CODE STATUS: full DIET: Reg DVT Px: LMWH GI Px:  PPI    SUBJ: Extubated 12/20, tolerated well w/ adequate sats and no increased wob.    VITAL SIGNS: Temp:  [97.4 F (36.3 C)-98.6 F (37 C)] 98.4 F (36.9 C) (12/21 0405) Pulse Rate:  [63-101] 77  (12/21 0700) Resp:  [11-29] 12  (12/21 0700) BP: (78-170)/(48-99) 134/75 mmHg (12/21 0700) SpO2:  [90 %-100 %] 98 % (12/21 0700) FiO2 (%):  [40 %] 40 % (12/20 0800) HEMODYNAMICS:   VENTILATOR SETTINGS: Vent Mode:  [-]  FiO2 (%):  [40 %] 40 % INTAKE / OUTPUT: Intake/Output      12/20 0701 - 12/21 0700 12/21 0701 - 12/22 0700   P.O. 600    I.V. (mL/kg) 166.1 (3.6)    NG/GT 80    IV Piggyback 350    Total Intake(mL/kg) 1196.1 (26.2)    Urine (mL/kg/hr) 312 (0.3)    Total Output 312    Net +884.1         Urine Occurrence 3 x    Stool Occurrence 3 x      PHYSICAL EXAMINATION: General: NAD  Neuro:  No focal deficits HEENT:  Poor dentition,lower JVD Cardiovascular: RRR s M Lungs: Diminshed BS in bases , no wheezing  Abdomen:  Soft, non-tender  Musculoskeletal:  Intact  Skin:  Intact    LABS: Cbc  Lab 09/20/12 0500 09/19/12 0437 09/18/12 1214  WBC 7.9 -- --  HGB 12.6 11.4* 11.7*  HCT 39.8 37.0 38.3  PLT 173 133*  113*    Chemistry   Lab 09/21/12 0500 09/20/12 0500 09/19/12 0437  NA 141 140 139  K 3.0* 3.5 3.8  CL 93* 96 104  CO2 35* 32 23  BUN 33* 27* 22  CREATININE 0.53 0.58 0.63  CALCIUM 9.0 9.2 9.2  MG -- -- --  PHOS -- -- --  GLUCOSE 146* 209* 113*    Liver fxn  Lab 09/20/12 0500 09/19/12 0437  AST 11 10  ALT 8 7  ALKPHOS 117 117  BILITOT 0.2* 0.1*  PROT 6.9 6.5  ALBUMIN 2.7* 2.4*   coags No results found for this basename: APTT:3,INR:3 in the last 168 hours Sepsis markers  Lab 09/21/12 0500 09/20/12 0500 09/19/12 1553 09/18/12 1214  LATICACIDVEN -- -- -- 0.9  PROCALCITON 1.25 3.25 5.33 --   Cardiac markers  Lab 09/19/12 0437 09/18/12 2016 09/18/12 1214  CKTOTAL -- -- --  CKMB -- -- --  TROPONINI <0.30 <0.30 <0.30   BNP  Lab 09/18/12 1214  PROBNP 3321.0*   ABG  Lab 09/20/12 1607 09/20/12 1435 09/20/12 0500  PHART 7.495* 7.456* 7.435  PCO2ART 51.0* 55.0* 48.8*  PO2ART 64.0* 74.0* 78.3*  HCO3 39.3* 38.7* 32.2*  TCO2 41 40 33.7  CBG trend  Lab 09/21/12 2239 09/21/12 2015 09/21/12 1625 09/21/12 1237 09/21/12 0757  GLUCAP 113* 89 72 105* 131*    IMAGING: CXR: NAD   DIAGNOSES: Principal Problem:  *COPD with acute exacerbation Active Problems:  TOBACCO USE  ESSENTIAL HYPERTENSION  Severe chronic obstructive pulmonary disease  Substance abuse  New onset a-fib  Acute respiratory failure Anxiety >extubated 12/20, tolerated well.   PLAN: Cont BDs, abx Decrease systemic steroids to oral beginning 12/21  Needs extensive changes to her lifestyle - counseled return to IMTS service PCCM will cont as consultants Transfer to Tele  (very brief episode of AF 12/21 AM) D/C SSI   PARRETT,TAMMY NP -C  [place] [facility]Critical [facility]  [phone]   I have interviewed and examined the patient and reviewed the database. I have formulated the assessment and plan as reflected in the note above with amendments made by me.   Billy Fischer, MD;  PCCM  service; Mobile [phone]

## 2012-09-22 NOTE — Progress Notes (Deleted)
Pt stat 92% on RA when lying in bed put Pt on 2L, Note Pt has sleep apnea and Cpap. Will continue to monitor

## 2012-09-23 DIAGNOSIS — J9610 Chronic respiratory failure, unspecified whether with hypoxia or hypercapnia: Secondary | ICD-10-CM

## 2012-09-23 DIAGNOSIS — E46 Unspecified protein-calorie malnutrition: Secondary | ICD-10-CM

## 2012-09-23 LAB — BASIC METABOLIC PANEL
BUN: 28 mg/dL — ABNORMAL HIGH (ref 6–23)
Calcium: 9 mg/dL (ref 8.4–10.5)
Chloride: 97 mEq/L (ref 96–112)
Creatinine, Ser: 0.67 mg/dL (ref 0.50–1.10)
Creatinine, Ser: 0.58 mg/dL (ref 0.50–1.10)
GFR calc Af Amer: >90 mL/min (ref >90)
GFR calc Af Amer: >90 mL/min (ref >90)
GFR calc non Af Amer: >90 mL/min (ref >90)
Potassium: 4 mEq/L (ref 3.5–5.1)
Sodium: 139 mEq/L (ref 135–145)

## 2012-09-23 LAB — TROPONIN I: Troponin I: <0.3 ng/mL (ref <0.30)

## 2012-09-23 MED ORDER — KETOROLAC TROMETHAMINE 30 MG/ML IJ SOLN
30.0000 mg | Freq: Once | INTRAMUSCULAR | Status: AC
  Administered 2012-09-23: 30 mg via INTRAVENOUS
  Filled 2012-09-23: qty 1

## 2012-09-23 NOTE — Progress Notes (Signed)
Internal Medicine On-Call Team Progress Note - Late entry  Called by RN regarding patient's complain of chest pressure around 3:30pm. EKG done.  Subjective: Patient reported acute onset chest pressure, described as a needle piercing her chest.  No change in shortness of breath. No heart palpitations.    Objective:   Filed Vitals:   09/23/12 1539  BP: 123/68  Pulse: 75  Temp: 98.1 F (36.7 C)  Resp: 18   General: resting in bed, no acute distress HEENT: PERRL, EOMI, no scleral icterus Cardiac: RRR, no rubs, murmurs or gallops, chest wall tender to palpation Pulm: anterior wheezing Abd: soft, nontender, nondistended, BS normoactive Ext: warm and well perfused, no pedal edema Neuro: alert and oriented X3, cranial nerves II-XII grossly intact  Assessment/Plan: No significant EKG changes and initial troponin negative.  Tenderness to palpation highly suggestive of MSK etiology, especially in setting of COPD exacerbation with cough and recent intubation.  We treated patient with toradol 30mg  IV once and she received significant relief.  For this reason, we will not pursue further cardiac enzymes.

## 2012-09-23 NOTE — Progress Notes (Signed)
Internal Medicine Attending  Date: 09/23/2012  Patient name: Karina Weber Medical record number: [id number] Date of birth: Jul 16, 1956 Age: 56 y.o. Gender: female  I saw and evaluated the patient. I reviewed the resident's note by Dr. Burtis Junes and I agree with the resident's findings and plans as documented in her progress note.   She feels much improved from a breathing standpoint but is not yet at her baseline despite the continued antibiotics, bronchodilators and steroids.  She would like to stay for an additional day of therapy prior to discharge home.  Exam reveals wheezing and decreased air movement although I have no previous context in which to base the exam.  There is no lower extremity edema.  Will continue the antibiotics, steroids and bronchodilators with likely D/C to home in the AM.

## 2012-09-23 NOTE — Progress Notes (Signed)
Subjective: Pt was transferred out of ICU to IMTS. Brief ICU course: Pt was initially intubated 12/17 2/2 hypercarbic respiratory failure presumed non-compliance with medications as pt has had frequent similar presentations to the ICU. COPD exacerbations was expected and pt initially started on IV rocephin and levofloxacin along with solumedrol. Workup included no infiltrates on CXR, UCX negative, U strep pneumoniae and legionella both negative therefore IV rocephin was d/c on 12/19 but levo was continued. Pt was successfully extubated on 12/20 and transitioned to oral levo and prednisone before transfer to telemetry unit.   Pt had no acute events overnight. Pt does describe some SOB and DOE with walking around the room. Pt that resolved with scheduled nebs.   Objective: Vital signs in last 24 hours: Filed Vitals:   09/22/12 1800 09/22/12 2038 09/23/12 0552 09/23/12 0851  BP:  165/87 130/63   Pulse:  75 80   Temp:  98.6 F (37 C) 98.5 F (36.9 C)   TempSrc:  Oral Oral   Resp: 18 19 20    Height:      Weight:   100 lb 8.5 oz (45.6 kg)   SpO2: 94% 97% 98% 98%   Weight change:   Intake/Output Summary (Last 24 hours) at 09/23/12 0952 Last data filed at 09/23/12 0944  Gross per 24 hour  Intake    600 ml  Output    100 ml  Net    500 ml   General: resting in bed, NAD HEENT: PERRL, EOMI, no scleral icterus Cardiac: RRR, no rubs, murmurs or gallops Pulm: some diffuse crackles throughout lung fields, no wheezes, moving normal volumes of air Abd: soft, nontender, nondistended, BS present Ext: warm and well perfused, no pedal edema Neuro: alert and oriented X3, cranial nerves II-XII grossly intact  Lab Results: Basic Metabolic Panel:  Lab 09/23/12 1478 09/23/12 0620  NA 138 139  K 4.0 3.7  CL 95* 97  CO2 35* 33*  GLUCOSE 122* 114*  BUN 28* 29*  CREATININE 0.58 0.67  CALCIUM 9.2 9.0  MG -- --  PHOS -- --   CBC:  Lab 09/20/12 0500 09/19/12 0437  WBC 7.9 8.1  NEUTROABS 7.1  6.7  HGB 12.6 11.4*  HCT 39.8 37.0  MCV 89.4 91.1  PLT 173 133*   CBG:  Lab 09/22/12 2109 09/22/12 1613 09/22/12 1123 09/22/12 0717 09/21/12 2239 09/21/12 2015  GLUCAP 84 116* 162* 95 113* 89   Urine Drug Screen: Drugs of Abuse     Component Value Date/Time   LABOPIA NONE DETECTED 09/18/2012 1258   LABOPIA NEGATIVE 07/09/2012 1535   COCAINSCRNUR NONE DETECTED 09/18/2012 1258   COCAINSCRNUR POSITIVE* 07/09/2012 1535   LABBENZ POSITIVE* 09/18/2012 1258   LABBENZ NEGATIVE 07/09/2012 1535   AMPHETMU NONE DETECTED 09/18/2012 1258   AMPHETMU NEGATIVE 07/09/2012 1535   THCU NONE DETECTED 09/18/2012 1258   LABBARB NONE DETECTED 09/18/2012 1258    Urinalysis:  Lab 09/18/12 1258  COLORURINE YELLOW  LABSPEC 1.022  PHURINE 6.0  GLUCOSEU NEGATIVE  HGBUR MODERATE*  BILIRUBINUR MODERATE*  KETONESUR 15*  PROTEINUR >300*  UROBILINOGEN 1.0  NITRITE NEGATIVE  LEUKOCYTESUR NEGATIVE    Micro Results: Recent Results (from the past 240 hour(s))  MRSA PCR SCREENING     Status: Normal   Collection Time   09/18/12 12:38 PM      Component Value Range Status Comment   MRSA by PCR NEGATIVE  NEGATIVE Final   URINE CULTURE     Status: Normal   Collection  Time   09/18/12 12:58 PM      Component Value Range Status Comment   Specimen Description URINE, CATHETERIZED   Final    Special Requests NONE   Final    Culture  Setup Time 09/18/2012 21:43   Final    Colony Count NO GROWTH   Final    Culture NO GROWTH   Final    Report Status 09/19/2012 FINAL   Final   CULTURE, RESPIRATORY     Status: Normal   Collection Time   09/18/12  5:55 PM      Component Value Range Status Comment   Specimen Description TRACHEAL ASPIRATE   Final    Special Requests NONE   Final    Gram Stain     Final    Value: ABUNDANT WBC PRESENT,BOTH PMN AND MONONUCLEAR     NO SQUAMOUS EPITHELIAL CELLS SEEN     NO ORGANISMS SEEN   Culture Non-Pathogenic Oropharyngeal-type Flora Isolated.   Final    Report Status  09/21/2012 FINAL   Final    Studies/Results: No results found. Medications: I have reviewed the patient's current medications. Scheduled Meds:    . aspirin  81 mg Oral Daily  . bisoprolol  5 mg Oral Daily  . budesonide  0.25 mg Nebulization Q6H  . docusate sodium  100 mg Oral BID  . enoxaparin (LOVENOX) injection  30 mg Subcutaneous Q24H  . ferrous sulfate  325 mg Oral BID WC  . ipratropium  0.5 mg Nebulization Q6H  . levalbuterol  0.63 mg Nebulization Q6H  . levofloxacin  500 mg Intravenous Q24H  . predniSONE  40 mg Oral Q breakfast   Continuous Infusions:    . sodium chloride 20 mL/hr at 09/22/12 0600   PRN Meds:.acetaminophen, acetaminophen, HYDROcodone-acetaminophen, ondansetron (ZOFRAN) IV Assessment/Plan: Karina Weber 56yo pmh severe poorly controlled COPD, long term smoking and cocaine use admitted for ARF 2/2 presumed COPD exacerbation improving.   1. COPD Exacerbation: Patient with severe COPD with multiple hospital admissions for exacerbations usually requiring intubation, due to possible medication noncompliance, substance abuse and other social issues. Unclear etiology of this exacerbation event. Chest x-ray negative for infiltrates but showed bronchitis. -Duonebs every 4 hours.  - Levofloxacin -oral prednisone - Continue home inhalers. -smoking cessation education was given and pt has nicotine patches at home but still in pre-contemplation stage   Dispo: Disposition is deferred at this time, awaiting improvement of current medical problems.  Anticipated discharge in approximately 1-2 day(s).   The patient does have a current PCP (PRIBULA,CHRISTOPHER, MD), therefore will be requiring OPC follow-up after discharge.   The patient does not have transportation limitations that hinder transportation to clinic appointments.  .Services Needed at time of discharge: Y = Yes, Blank = No PT:   OT:   RN:   Equipment:   Other:     LOS: 5 days   [name] 09/23/2012,  9:52 AM

## 2012-09-23 NOTE — Progress Notes (Signed)
Pt complained of having chest discomfort and shortness of breath. Respiratory in to give neb tx and pt stated that chest tightness still remained and pt having increased respirations at this time. MD notified and new orders given.

## 2012-09-23 NOTE — Progress Notes (Signed)
Pt stated that Toradol helped better than all medicine she has taken recently. Stated that it stopped her coughing and chest pressure.

## 2012-09-24 MED ORDER — KETOROLAC TROMETHAMINE 10 MG PO TABS: 10.0000 mg | ORAL_TABLET | Freq: Four times a day (QID) | ORAL | Status: DC | PRN

## 2012-09-24 MED ORDER — PREDNISONE 20 MG PO TABS: 40.0000 mg | ORAL_TABLET | Freq: Every day | ORAL | Status: DC

## 2012-09-24 MED ORDER — LEVOFLOXACIN 500 MG PO TABS: 500.0000 mg | ORAL_TABLET | Freq: Every day | ORAL | Status: DC

## 2012-09-24 NOTE — Progress Notes (Signed)
Subjective: Pt had acute episode of "chest pressure" yesterday that was completely resolved with toradol. EKG was negative for new ischemic changes, trop x3 were negative, and on PE pain was directly reproducible with palpation. Pt denies any of this pressure this AM. Pt feels well this AM, eating well, no CP, SOB, or DOE. Feels ready to go home.   Objective: Vital signs in last 24 hours: Filed Vitals:   09/23/12 1946 09/23/12 2030 09/24/12 0125 09/24/12 0640  BP:  142/65  112/79  Pulse:  77  78  Temp:  98.2 F (36.8 C)  98.3 F (36.8 C)  TempSrc:  Oral  Oral  Resp:  20  20  Height:      Weight:    100 lb 15.5 oz (45.8 kg)  SpO2: 98% 99% 97% 99%   Weight change: 5 lb 1.1 oz (2.3 kg)  Intake/Output Summary (Last 24 hours) at 09/24/12 0814 Last data filed at 09/23/12 1838  Gross per 24 hour  Intake    720 ml  Output    101 ml  Net    619 ml   General: resting in bed, NAD HEENT: PERRL, EOMI, no scleral icterus Cardiac: RRR, no rubs, murmurs or gallops Pulm: some diffuse crackles throughout lung fields, no wheezes, moving normal volumes of air Abd: soft, nontender, nondistended, BS present Ext: warm and well perfused, no pedal edema Neuro: alert and oriented X3, cranial nerves II-XII grossly intact  Lab Results: Basic Metabolic Panel:  Lab 09/23/12 1610 09/23/12 0620  NA 138 139  K 4.0 3.7  CL 95* 97  CO2 35* 33*  GLUCOSE 122* 114*  BUN 28* 29*  CREATININE 0.58 0.67  CALCIUM 9.2 9.0  MG -- --  PHOS -- --   CBG:  Lab 09/24/12 0601 09/23/12 2119 09/23/12 1600 09/23/12 1117 09/22/12 2109 09/22/12 1613  GLUCAP 95 121* 126* 155* 84 116*   Studies/Results: No results found. Medications: I have reviewed the patient's current medications. Scheduled Meds:    . aspirin  81 mg Oral Daily  . bisoprolol  5 mg Oral Daily  . budesonide  0.25 mg Nebulization Q6H  . docusate sodium  100 mg Oral BID  . enoxaparin (LOVENOX) injection  30 mg Subcutaneous Q24H  . ferrous  sulfate  325 mg Oral BID WC  . ipratropium  0.5 mg Nebulization Q6H  . levalbuterol  0.63 mg Nebulization Q6H  . levofloxacin  500 mg Intravenous Q24H  . predniSONE  40 mg Oral Q breakfast   Continuous Infusions:    . sodium chloride 20 mL/hr at 09/22/12 0600   PRN Meds:.acetaminophen, acetaminophen, HYDROcodone-acetaminophen, ondansetron (ZOFRAN) IV Assessment/Plan: Karina Weber 56yo pmh severe poorly controlled COPD, long term smoking and cocaine use admitted for ARF 2/2 presumed COPD exacerbation improving.   1. COPD Exacerbation: Patient with severe COPD with multiple hospital admissions for exacerbations usually requiring intubation, due to possible medication noncompliance, substance abuse and other social issues. Unclear etiology of this exacerbation event. Chest x-ray negative for infiltrates but showed bronchitis. -Duonebs every 4 hours.  - Levofloxacin -oral prednisone - Continue home inhalers. -smoking cessation education was given and pt has nicotine patches at home but still in pre-contemplation stage   Dispo: Disposition is deferred at this time, awaiting improvement of current medical problems.  Anticipated discharge in approximately 1-2 day(s).   The patient does have a current PCP (PRIBULA,CHRISTOPHER, MD), therefore will be requiring OPC follow-up after discharge.   The patient does not have transportation  limitations that hinder transportation to clinic appointments.  .Services Needed at time of discharge: Y = Yes, Blank = No PT:   OT:   RN:   Equipment:   Other:     LOS: 6 days   [name] 09/24/2012, 8:14 AM [phone]

## 2012-09-24 NOTE — Progress Notes (Signed)
Internal Medicine Attending  Date: 09/24/2012  Patient name: Karina Weber Medical record number: [id number] Date of birth: September 25, 1956 Age: 56 y.o. Gender: female  I saw and evaluated the patient. I reviewed the resident's note by Dr. Burtis Junes and I agree with the resident's findings and plans as documented in her progress note.  [name] feel's she is at baseline from a pulmonary standpoint and is ready for discharge home. I agree with the housestaff's plan to discharge her home on her bronchodilators, steroid taper, and antibiotics. [facility].

## 2012-09-24 NOTE — Progress Notes (Signed)
D/c instructions given to pt regarding f/u care, s/s of when to notify MD, home medications, and diet/activity restrictions.  Pt acknowledged receipt with all questions answered and pt verbalized understanding.  IV and telemetry removed from pt.  Will transport via wheelchair when ride is available.

## 2012-09-24 NOTE — Progress Notes (Signed)
Pt had 11 beat Vtach, asymptomatic while lying in bed.  MD notified with call back, no new orders at this time.  Will con't to monitor and will notify MD of any changes.

## 2012-09-24 NOTE — Progress Notes (Signed)
PULMONARY  / CRITICAL CARE MEDICINE  Name: Karina Weber MRN: [id number] DOB: 1956-04-10    LOS: 6  REFERRING MD :  Aundria Rud   CHIEF COMPLAINT:  Respiratory failure   BRIEF PATIENT DESCRIPTION:  72 yobf active smoker w/ chronic resp failure on basis of COPD and medical non-compliance. Admitted on 12/17 for acute respiratory failure.   LINES / TUBES: ETT 12/17>>>12/20 Right IJ CVL 12/17>>> Rad a line 12/17>>>12/19  CULTURES: Sputum 12/17>>>NOF U strep 12/17>>>NEG U legionella 12/17>>>NEG   ANTIBIOTICS: levaquin 12/17>>> Rocephin 12/17>>>12/19  SIGNIFICANT EVENTS:  12/18 echo - 65%, rv mildly dil, ivc dilated, pa 39  LEVEL OF CARE:  ICU PRIMARY SERVICE:  Teaching svc CONSULTANTS:   CODE STATUS: full DIET: Reg DVT Px: LMWH GI Px:  PPI    SUBJ: No distress, up in bed.  Wanting to go home.    VITAL SIGNS: Temp:  [98.1 F (36.7 C)-98.5 F (36.9 C)] 98.3 F (36.8 C) (12/23 0640) Pulse Rate:  [74-81] 81  (12/23 0945) Resp:  [18-20] 20  (12/23 0640) BP: (106-142)/(59-79) 125/74 mmHg (12/23 0945) SpO2:  [91 %-99 %] 91 % (12/23 0945) Weight:  [100 lb 15.5 oz (45.8 kg)] 100 lb 15.5 oz (45.8 kg) (12/23 0640) FIO2 3lpm   INTAKE / OUTPUT: Intake/Output      12/22 0701 - 12/23 0700 12/23 0701 - 12/24 0700   P.O. 720 240   Total Intake(mL/kg) 720 (15.7) 240 (5.2)   Urine (mL/kg/hr) 100 (0.1)    Stool 1    Total Output 101    Net +619 +240          PHYSICAL EXAMINATION: General: NAD  Neuro:  No focal deficits HEENT:  Poor dentition,lower JVD Cardiovascular: RRR s M Lungs: Diminshed BS in bases , no wheezing  Abdomen:  Soft, non-tender  Musculoskeletal:  Intact  Skin:  Intact    LABS: Cbc  Lab 09/20/12 0500 09/19/12 0437 09/18/12 1214  WBC 7.9 -- --  HGB 12.6 11.4* 11.7*  HCT 39.8 37.0 38.3  PLT 173 133* 113*   Chemistry  Lab 09/23/12 0838 09/23/12 0620 09/21/12 0500  NA 138 139 141  K 4.0 3.7 3.0*  CL 95* 97 93*  CO2 35* 33* 35*  BUN 28*  29* 33*  CREATININE 0.58 0.67 0.53  CALCIUM 9.2 9.0 9.0  MG -- -- --  PHOS -- -- --  GLUCOSE 122* 114* 146*    Liver fxn  Lab 09/20/12 0500 09/19/12 0437  AST 11 10  ALT 8 7  ALKPHOS 117 117  BILITOT 0.2* 0.1*  PROT 6.9 6.5  ALBUMIN 2.7* 2.4*    Lab 09/21/12 0500 09/20/12 0500 09/19/12 1553 09/18/12 1214  LATICACIDVEN -- -- -- 0.9  PROCALCITON 1.25 3.25 5.33 --   Cardiac markers  Lab 09/23/12 1624 09/19/12 0437 09/18/12 2016  CKTOTAL -- -- --  CKMB -- -- --  TROPONINI <0.30 <0.30 <0.30   BNP  Lab 09/18/12 1214  PROBNP 3321.0*   ABG  Lab 09/20/12 1607 09/20/12 1435 09/20/12 0500  PHART 7.495* 7.456* 7.435  PCO2ART 51.0* 55.0* 48.8*  PO2ART 64.0* 74.0* 78.3*  HCO3 39.3* 38.7* 32.2*  TCO2 41 40 33.7    CBG trend  Lab 09/24/12 0601 09/23/12 2119 09/23/12 1600 09/23/12 1117 09/22/12 2109  GLUCAP 95 121* 126* 155* 84    IMAGING: CXR: NAD   DIAGNOSES: Principal Problem:  *COPD with acute exacerbation Active Problems:  TOBACCO USE  ESSENTIAL HYPERTENSION  Severe chronic  obstructive pulmonary disease  Substance abuse  New onset a-fib  Acute respiratory failure Anxiety >extubated 12/20, tolerated well.   PLAN: Cont BDs, abx Decrease systemic steroids to oral beginning 12/21  Needs extensive changes to her lifestyle - counseled Continue home O2 at 2L I gave her my card and asked her to see me in office w/in 2 weeks of discharge with all meds/ inhalers in hand if she wants our help staying out of the er/ hosp but also explained:   although we never turn away smokers from the pulmonary clinic, we do ask that they understand that the recommendations that we make  won't work nearly as well in the presence of continued cigarette exposure.  In fact, we may very well  reach a point where we can't promise to help the patient if he/she can't quit smoking. (We can and will promise to try to help, we just can't promise what we recommend will really work)      Pending discharge per IMTS.   Sandrea Hughs, MD Pulmonary and Critical Care Medicine [facility] Cell [phone]

## 2012-09-24 NOTE — Discharge Summary (Signed)
Internal Medicine Teaching [facility] Discharge Note  Name: Karina Weber MRN: [id number] DOB: 1956/04/28 56 y.o.  Date of Admission: 09/18/2012  6:57 AM Date of Discharge: 09/25/2012 Attending Physician: Dr. Josem Kaufmann  Discharge Diagnosis: Principal Problem:  *COPD with acute exacerbation Active Problems:  TOBACCO USE  ESSENTIAL HYPERTENSION  Severe chronic obstructive pulmonary disease  Substance abuse  New onset a-fib  Acute respiratory failure   Discharge Medications:   Medication List     As of 09/25/2012 10:33 AM    TAKE these medications         albuterol 108 (90 BASE) MCG/ACT inhaler   Commonly known as: PROVENTIL HFA;VENTOLIN HFA   Inhale 2 puffs into the lungs every 4 (four) hours as needed for wheezing or shortness of breath.      aspirin 81 MG tablet   Take 1 tablet (81 mg total) by mouth daily.      bisoprolol 10 MG tablet   Commonly known as: ZEBETA   Take 1 tablet (10 mg total) by mouth daily.      ferrous sulfate 325 (65 FE) MG tablet   Take 1 tablet (325 mg total) by mouth 2 (two) times daily.      Fluticasone-Salmeterol 500-50 MCG/DOSE Aepb   Commonly known as: ADVAIR   Inhale 1 puff into the lungs every 12 (twelve) hours.      ketorolac 10 MG tablet   Commonly known as: TORADOL   Take 1 tablet (10 mg total) by mouth every 6 (six) hours as needed for pain.      levofloxacin 500 MG tablet   Commonly known as: LEVAQUIN   Take 1 tablet (500 mg total) by mouth daily.      mirtazapine 15 MG tablet   Commonly known as: REMERON   Take 1 tablet (15 mg total) by mouth at bedtime.      nicotine 21 mg/24hr patch   Commonly known as: NICODERM CQ - dosed in mg/24 hours   Place 1 patch onto the skin daily.      predniSONE 20 MG tablet   Commonly known as: DELTASONE   Take 2 tablets (40 mg total) by mouth daily with breakfast.      SUMAtriptan 50 MG tablet   Commonly known as: IMITREX   Take 1 tablet (50 mg total) by mouth once as needed for  migraine.      tiotropium 18 MCG inhalation capsule   Commonly known as: SPIRIVA   Place 1 capsule (18 mcg total) into inhaler and inhale daily.          Disposition and follow-up:   [name] was discharged from [facility] in stable condition.  At the hospital follow up visit please address completion of levofloxacin, prednisone taper, and compliance with nebs.   Follow-up Appointments:  Discharge Orders    Future Orders Please Complete By Expires   Increase activity slowly         Consultations:  PCCM  Procedures Performed:  Dg Chest Port 1 View  09/21/2012  *RADIOLOGY REPORT*  Clinical Data: Edema, follow-up, evaluate endotracheal tube position  PORTABLE CHEST - 1 VIEW  Comparison: Portable chest x-ray of 09/20/2012  Findings: The endotracheal tube tip remains somewhat low, 1.6 cm above the carina.  The right IJ central venous line is unchanged in position and NG tube remains.  The lungs are relatively well aerated with only mild basilar atelectasis present.  IMPRESSION:  1.  Tip of endotracheal  tube only 1.6 cm above the carina. 2.  Mild basilar atelectasis.   Original Report Authenticated By: Dwyane Dee, M.D.    Dg Chest Port 1 View  09/20/2012  *RADIOLOGY REPORT*  Clinical Data: Respiratory distress.  PORTABLE CHEST - 1 VIEW  Comparison: 09/18/2012.  Findings: The endotracheal tube is 19 mm above the carina.  The NG tube is coursing down the esophagus and into the stomach.  The right IJ catheter is stable.  The heart and lungs are unchanged. Minimal streaky areas of atelectasis but no edema, infiltrates or effusions.  No pneumothorax.  IMPRESSION:  1.  The endotracheal tube is 19 mm above the carina.  The right IJ catheter and NG tubes are in good position. 2.  Minimal streaky areas of atelectasis.   Original Report Authenticated By: Rudie Meyer, M.D.    Dg Chest Port 1 View  09/18/2012  *RADIOLOGY REPORT*  Clinical Data: Acute respiratory failure  on ventilator.  Central line placement.  PORTABLE CHEST - 1 VIEW  Comparison: 09/18/2012  Findings: New endotracheal tube is seen in place with tip approximately 0.5 cm above carina.  A new right internal jugular center venous catheter is seen with tip in the mid SVC.  No evidence of pneumothorax.  Decreased diffuse interstitial infiltrates are seen since prior study, consistent with decreased pulmonary edema.  Minimal atelectasis or scarring seen in the left midlung.  No evidence of pulmonary consolidation.  No definite pleural effusions seen. Heart size is stable allowing for decreased lung volumes.  IMPRESSION:  1.  Endotracheal tube and right jugular center venous catheter in appropriate position.  No pneumothorax identified. 2.  Resolving diffuse interstitial infiltrates/edema.   Original Report Authenticated By: Myles Rosenthal, M.D.    Dg Chest Port 1 View  09/18/2012  *RADIOLOGY REPORT*  Clinical Data: Cough.  PORTABLE CHEST - 1 VIEW  Comparison: 07/12/2012.  Findings: The heart is enlarged but stable.  There is tortuosity of the thoracic aorta.  The lungs demonstrate peribronchial thickening and increased interstitial markings.  Findings could suggest severe bronchitis, interstitial pneumonitis or interstitial edema.  No definite pleural effusions.  IMPRESSION:  1.  Cardiac enlargement with peribronchial thickening and increased interstitial markings which could reflect bronchitis, interstitial pneumonitis or interstitial edema. 2.  No focal airspace consolidation or definite pleural effusion.   Original Report Authenticated By: Rudie Meyer, M.D.     2D Echo: Left ventricle: The cavity size was normal. Wall thickness was normal. The estimated ejection fraction was 65%. Wall motion was normal; there were no regional wall motion abnormalities. - Right ventricle: The cavity size was mildly dilated. Systolic function was mildly reduced. RV systolic pressure: 39mm Hg (S, est). - Right atrium: The atrium was  mildly dilated. - Tricuspid valve: Trivial regurgitation. - Pulmonary arteries: PA peak pressure: 39mm Hg (S). - Systemic veins: IVC is dilated.  Admission HPI: Patient is a 21 year woman with past history of severe COPD, long term smoking and multiple other medical problems as per problem list who comes to the ED due to acute worsening of shortness of breath. Patient woke up today and was not able to catch her breath. She also reports wheezing. She was progressively getting worse for past 2 days and wanted to come to the hospital yesterday but did not get a ride. She also reports having cough with whitish sputum production for past 2 days without any blood.  Also reports midsternal chest pain, which she says accompanies her COPD flareup every time.  She denies any headache, vision changes, palpitations, radiation of chest pain. She reports using one inhaler every day. Unclear if she uses on her COPD inhalers. Denies any abdominal pain, diarrhea, nausea vomiting.  Hospital Course by problem list: 1) Acute respiratory failure- 2/2 exacerbation of severe COPD: Patient with severe COPD with multiple hospital admissions for exacerbations usually requiring intubation, due to possible medication noncompliance, substance abuse and other social issues. Unclear etiology of this exacerbation event. Chest x-ray negative for infiltrates but showed bronchitis. Pt severely decompensated and became altered when she was admitted to the floor and then immediately intubated 12/17 2/2 hypercarbic respiratory failure presumed non-compliance with medications and transferred to the ICU. COPD exacerbation was expected and pt initially started on IV rocephin and levofloxacin along with IV solumedrol. Workup included no infiltrates on CXR, UCX negative, U strep pneumoniae and legionella both negative therefore IV rocephin was d/c on 12/19 but levo was continued. Pt was successfully extubated on 12/20 and transitioned to oral levo  and prednisone before transfer to telemetry unit. She was continued on oral levo and d/c to complete a 14 day course. She was also given 40mg  prednisone with 2wk taper. Dr. Sherene Sires her pulmonologist is to see her in 2 wks to discuss management but is frustrated by pt's decisions and that their clinic maynot be able to fully manage her care if she doesn't quit smoking (see his 09/24/12 note for details).    Discharge Vitals:  BP 125/74  Pulse 81  Temp 98.3 F (36.8 C) (Oral)  Resp 20  Ht 5' (1.524 m)  Wt 100 lb 15.5 oz (45.8 kg)  BMI 19.72 kg/m2  SpO2 91% General: resting in bed, NAD  HEENT: PERRL, EOMI, no scleral icterus  Cardiac: RRR, no rubs, murmurs or gallops  Pulm: some diffuse crackles throughout lung fields, no wheezes, moving normal volumes of air  Abd: soft, nontender, nondistended, BS present  Ext: warm and well perfused, no pedal edema  Neuro: alert and oriented X3, cranial nerves II-XII grossly intact  Discharge Labs:  No results found for this or any previous visit (from the past 24 hour(s)).  Signed: [name] 09/25/2012, 10:33 AM   Time Spent on Discharge: 30 min Services Ordered on Discharge: none Equipment Ordered on Discharge: none

## 2012-11-19 NOTE — Addendum Note (Signed)
Addended by: Neomia Dear on: 11/19/2012 08:05 PM   Modules accepted: Orders

## 2012-12-11 ENCOUNTER — Telehealth: Payer: Self-pay | Admitting: Internal Medicine

## 2012-12-11 ENCOUNTER — Encounter: Payer: Self-pay | Admitting: Internal Medicine

## 2012-12-11 NOTE — Telephone Encounter (Signed)
Patient was called this morning to be scheduled with her PCP for a HM visit.  No answer, but left detailed message asking her to give [place] a call back.  She is also going to be sent a letter as well asking the same.

## 2013-01-16 ENCOUNTER — Encounter (HOSPITAL_COMMUNITY): Payer: Self-pay | Admitting: Family Medicine

## 2013-01-16 ENCOUNTER — Emergency Department (HOSPITAL_COMMUNITY): Payer: Medicare Other

## 2013-01-16 ENCOUNTER — Inpatient Hospital Stay (HOSPITAL_COMMUNITY)
Admission: EM | Admit: 2013-01-16 | Discharge: 2013-01-18 | DRG: 180 | Disposition: A | Discharge status: Home Health | Payer: Medicare Other | Attending: Internal Medicine | Admitting: Internal Medicine

## 2013-01-16 DIAGNOSIS — IMO0002 Reserved for concepts with insufficient information to code with codable children: Secondary | ICD-10-CM

## 2013-01-16 DIAGNOSIS — J9620 Acute and chronic respiratory failure, unspecified whether with hypoxia or hypercapnia: Secondary | ICD-10-CM | POA: Diagnosis present

## 2013-01-16 DIAGNOSIS — R51 Headache: Secondary | ICD-10-CM | POA: Diagnosis present

## 2013-01-16 DIAGNOSIS — F1210 Cannabis abuse, uncomplicated: Secondary | ICD-10-CM | POA: Diagnosis present

## 2013-01-16 DIAGNOSIS — E46 Unspecified protein-calorie malnutrition: Secondary | ICD-10-CM | POA: Diagnosis present

## 2013-01-16 DIAGNOSIS — C3410 Malignant neoplasm of upper lobe, unspecified bronchus or lung: Principal | ICD-10-CM | POA: Diagnosis present

## 2013-01-16 DIAGNOSIS — G43709 Chronic migraine without aura, not intractable, without status migrainosus: Secondary | ICD-10-CM | POA: Diagnosis present

## 2013-01-16 DIAGNOSIS — J9600 Acute respiratory failure, unspecified whether with hypoxia or hypercapnia: Secondary | ICD-10-CM

## 2013-01-16 DIAGNOSIS — F1020 Alcohol dependence, uncomplicated: Secondary | ICD-10-CM | POA: Diagnosis present

## 2013-01-16 DIAGNOSIS — Z79899 Other long term (current) drug therapy: Secondary | ICD-10-CM

## 2013-01-16 DIAGNOSIS — R519 Headache, unspecified: Secondary | ICD-10-CM | POA: Diagnosis present

## 2013-01-16 DIAGNOSIS — F17200 Nicotine dependence, unspecified, uncomplicated: Secondary | ICD-10-CM | POA: Diagnosis present

## 2013-01-16 DIAGNOSIS — Z9119 Patient's noncompliance with other medical treatment and regimen: Secondary | ICD-10-CM

## 2013-01-16 DIAGNOSIS — J189 Pneumonia, unspecified organism: Secondary | ICD-10-CM

## 2013-01-16 DIAGNOSIS — R64 Cachexia: Secondary | ICD-10-CM | POA: Diagnosis present

## 2013-01-16 DIAGNOSIS — E871 Hypo-osmolality and hyponatremia: Secondary | ICD-10-CM | POA: Diagnosis present

## 2013-01-16 DIAGNOSIS — Z681 Body mass index (BMI) 19 or less, adult: Secondary | ICD-10-CM

## 2013-01-16 DIAGNOSIS — Z8249 Family history of ischemic heart disease and other diseases of the circulatory system: Secondary | ICD-10-CM

## 2013-01-16 DIAGNOSIS — Z91199 Patient's noncompliance with other medical treatment and regimen due to unspecified reason: Secondary | ICD-10-CM

## 2013-01-16 DIAGNOSIS — F1910 Other psychoactive substance abuse, uncomplicated: Secondary | ICD-10-CM | POA: Diagnosis present

## 2013-01-16 DIAGNOSIS — Z801 Family history of malignant neoplasm of trachea, bronchus and lung: Secondary | ICD-10-CM

## 2013-01-16 DIAGNOSIS — I498 Other specified cardiac arrhythmias: Secondary | ICD-10-CM | POA: Diagnosis present

## 2013-01-16 DIAGNOSIS — Z7982 Long term (current) use of aspirin: Secondary | ICD-10-CM

## 2013-01-16 DIAGNOSIS — R0902 Hypoxemia: Secondary | ICD-10-CM | POA: Diagnosis present

## 2013-01-16 DIAGNOSIS — I10 Essential (primary) hypertension: Secondary | ICD-10-CM | POA: Diagnosis present

## 2013-01-16 DIAGNOSIS — F1410 Cocaine abuse, uncomplicated: Secondary | ICD-10-CM | POA: Diagnosis present

## 2013-01-16 DIAGNOSIS — J441 Chronic obstructive pulmonary disease with (acute) exacerbation: Secondary | ICD-10-CM | POA: Diagnosis present

## 2013-01-16 DIAGNOSIS — Z888 Allergy status to other drugs, medicaments and biological substances status: Secondary | ICD-10-CM

## 2013-01-16 DIAGNOSIS — C3490 Malignant neoplasm of unspecified part of unspecified bronchus or lung: Secondary | ICD-10-CM | POA: Diagnosis present

## 2013-01-16 LAB — COMPREHENSIVE METABOLIC PANEL
Albumin: 3.2 g/dL — ABNORMAL LOW (ref 3.5–5.2)
BUN: 11 mg/dL (ref 6–23)
Chloride: 96 mEq/L (ref 96–112)
Creatinine, Ser: 0.53 mg/dL (ref 0.50–1.10)
Total Bilirubin: 0.2 mg/dL — ABNORMAL LOW (ref 0.3–1.2)

## 2013-01-16 LAB — CBC WITH DIFFERENTIAL/PLATELET
Basophils Relative: 0 % (ref 0–1)
Eosinophils Absolute: 0.1 10*3/uL (ref 0.0–0.7)
HCT: 38.3 % (ref 36.0–46.0)
Hemoglobin: 12.4 g/dL (ref 12.0–15.0)
MCH: 29.8 pg (ref 26.0–34.0)
MCHC: 32.4 g/dL (ref 30.0–36.0)
MCV: 92.1 fL (ref 78.0–100.0)
Monocytes Absolute: 0.9 10*3/uL (ref 0.1–1.0)
Monocytes Relative: 12 % (ref 3–12)
Neutro Abs: 5 10*3/uL (ref 1.7–7.7)

## 2013-01-16 LAB — POCT I-STAT 3, ART BLOOD GAS (G3+)
TCO2: 33 mmol/L (ref 0–100)
pCO2 arterial: 62.8 mmHg (ref 35.0–45.0)
pH, Arterial: 7.302 — ABNORMAL LOW (ref 7.350–7.450)

## 2013-01-16 MED ORDER — IOHEXOL 350 MG/ML SOLN
100.0000 mL | Freq: Once | INTRAVENOUS | Status: AC | PRN
  Administered 2013-01-16: 100 mL via INTRAVENOUS

## 2013-01-16 MED ORDER — IPRATROPIUM BROMIDE 0.02 % IN SOLN: 0.5000 mg | Freq: Once | RESPIRATORY_TRACT | Status: DC

## 2013-01-16 MED ORDER — ALBUTEROL (5 MG/ML) CONTINUOUS INHALATION SOLN
20.0000 mg/h | INHALATION_SOLUTION | Freq: Once | RESPIRATORY_TRACT | Status: AC
  Administered 2013-01-16: 20 mg/h via RESPIRATORY_TRACT
  Filled 2013-01-16: qty 20

## 2013-01-16 MED ORDER — DEXTROSE 5 % IV SOLN
1.0000 g | Freq: Once | INTRAVENOUS | Status: AC
  Administered 2013-01-16: 1 g via INTRAVENOUS
  Filled 2013-01-16: qty 10

## 2013-01-16 MED ORDER — AZITHROMYCIN 1 G PO PACK
1.0000 g | PACK | Freq: Once | ORAL | Status: AC
  Administered 2013-01-16: 1 g via ORAL
  Filled 2013-01-16: qty 1

## 2013-01-16 MED ORDER — METHYLPREDNISOLONE SODIUM SUCC 125 MG IJ SOLR
125.0000 mg | Freq: Once | INTRAMUSCULAR | Status: AC
  Administered 2013-01-16: 125 mg via INTRAVENOUS
  Filled 2013-01-16: qty 2
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
## 2013-01-16 NOTE — ED Notes (Signed)
Pt returned from CT  St's now having mid upper chest pain.  Dr. Fredderick Phenix made aware and at bedside.

## 2013-01-16 NOTE — ED Notes (Signed)
Pt st's she has been out of all her medications for 2 weeks, st's she could not find anyone to get her meds for her.  Pt st's today she started feeling bad and knew she needed to come to ED

## 2013-01-16 NOTE — ED Provider Notes (Signed)
Date: 01/16/2013  Rate: 134  Rhythm: sinus tachycardia  QRS Axis: right  Intervals: normal  ST/T Wave abnormalities: nonspecific ST/T changes  Conduction Disutrbances:none  Narrative Interpretation:   Old EKG Reviewed: unchanged  Pt with tachypnea, hemoptysis, diminished breath sounds, hx of COPD, remote hx of lung cancer.  Has new mass on CT.  sats okay on oxygen.  Was initially markedly hypoxic.  Will admit, abx, steroids, beta 2 agonist.  Rolan Bucco, MD 01/17/13 [phone]

## 2013-01-16 NOTE — ED Provider Notes (Signed)
57 year old with history of COPD, history of remote adenocarcinoma status post surgical removal. She also has history of past intubations 2 respiratory failure. She presents with shortness of breath and hemoptysis. Her initial oxygen saturation was 60% in room air however she came up rapidly on oxygen. She is tachypneic and has diminished breath sounds bilaterally. She will be treated with steroids, beta 2 agonists and oxygen. We're checking labs, chest x-ray and possibly CT chest for further evaluation.  Rolan Bucco, MD 01/16/13 [phone]

## 2013-01-16 NOTE — ED Notes (Signed)
Pt monitor sts O2 58%/RA- pt c/o chest pain and minimal SOB- pt respirations shallow 18bpm. Pt placed on NRB and asked to take deep breaths O2 sats raised to 100%. Pt placed on 4L [place] o2 sats 94%

## 2013-01-16 NOTE — ED Provider Notes (Signed)
History     CSN: [id number]  Arrival date & time 01/16/13  1816   First MD Initiated Contact with Patient 01/16/13 1825      Chief Complaint  Patient presents with  . Shortness of Breath    (Consider location/radiation/quality/duration/timing/severity/associated sxs/prior treatment) Patient is a 57 y.o. female presenting with shortness of breath. The history is provided by the patient and medical records.  Shortness of Breath Severity:  Severe Onset quality:  Gradual Duration:  14 days Timing:  Constant Progression:  Worsening Chronicity:  Recurrent Context comment:  Hx of COPD and broncoadenocarcinoma; worsened past day with new associated hemoptysis Relieved by:  Inhaler (ran out yesterday) Associated symptoms: chest pain, cough and wheezing   Associated symptoms: no abdominal pain, no fever, no neck pain, no rash and no vomiting   Risk factors comment:  COPD; bronchoadenocarcinoma   Past Medical History  Diagnosis Date  . Tobacco abuse   . COPD (chronic obstructive pulmonary disease)     exacerbagtion in 6/12 and in 2010.   Marland Kitchen Hypertension   . History of radiation therapy 07/19/10 to 07/28/10    RUL lung  . 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 Alcohol abuse     2 beers a day  . Asthma   . Headache   . Chronic hypercapnic respiratory failure   . Polysubstance abuse     Past Surgical History  Procedure Laterality Date  . No past surgeries      Family History  Problem Relation Age of Onset  . Heart disease Neg Hx   . Cancer Mother     lung  . Cancer Father     lung  . Hypertension      History  Substance Use Topics  . Smoking status: Current Some Day Smoker -- 0.20 packs/day for 40 years    Types: Cigarettes  . Smokeless tobacco: Never Used     Comment: Cut back from 2 ppd when diagnosed with Lung cancer.12/02/11 > 3 cig/day 07/20/12  . Alcohol Use: No     Comment: Former alcoholic    OB History   Grav Para Term Preterm Abortions TAB SAB  Ect Mult Living                  Review of Systems  Constitutional: Negative for fever, chills, activity change and appetite change.  HENT: Negative for neck pain and neck stiffness.   Respiratory: Positive for cough, chest tightness, shortness of breath and wheezing.   Cardiovascular: Positive for chest pain and palpitations. Negative for leg swelling.  Gastrointestinal: Negative for vomiting, abdominal pain, diarrhea and constipation.  Genitourinary: Negative for dysuria, decreased urine volume and difficulty urinating.  Skin: Negative for rash and wound.  Neurological: Negative for seizures, syncope, facial asymmetry and light-headedness.  Psychiatric/Behavioral: Negative for confusion and agitation.  All other systems reviewed and are negative.    Allergies  Lisinopril  Home Medications   Current Outpatient Rx  Name  Route  Sig  Dispense  Refill  . albuterol (VENTOLIN HFA) 108 (90 BASE) MCG/ACT inhaler   Inhalation   Inhale 2 puffs into the lungs every 4 (four) hours as needed for wheezing or shortness of breath.   18 g   6   . aspirin EC 81 MG tablet   Oral   Take 81 mg by mouth daily.         . bisoprolol (ZEBETA) 10 MG tablet   Oral  Take 1 tablet (10 mg total) by mouth daily.   30 tablet   3   . ferrous sulfate 325 (65 FE) MG tablet   Oral   Take 1 tablet (325 mg total) by mouth 2 (two) times daily.   60 tablet   5   . Fluticasone-Salmeterol (ADVAIR) 500-50 MCG/DOSE AEPB   Inhalation   Inhale 1 puff into the lungs every 12 (twelve) hours.   60 each   3   . ketorolac (TORADOL) 10 MG tablet   Oral   Take 1 tablet (10 mg total) by mouth every 6 (six) hours as needed for pain.   20 tablet   0   . levofloxacin (LEVAQUIN) 500 MG tablet   Oral   Take 1 tablet (500 mg total) by mouth daily.   3 tablet   0   . mirtazapine (REMERON) 15 MG tablet   Oral   Take 1 tablet (15 mg total) by mouth at bedtime.   30 tablet   6   . predniSONE (DELTASONE)  20 MG tablet   Oral   Take 2 tablets (40 mg total) by mouth daily with breakfast.   20 tablet   0     Taper: 40 mg for 7 days and then 20 mg for 3 days  ...   . SUMAtriptan (IMITREX) 50 MG tablet   Oral   Take 1 tablet (50 mg total) by mouth once as needed for migraine.   30 tablet   2   . tiotropium (SPIRIVA HANDIHALER) 18 MCG inhalation capsule   Inhalation   Place 1 capsule (18 mcg total) into inhaler and inhale daily.   30 capsule   3     BP 162/99  Pulse 143  Temp(Src) 98.5 F (36.9 C)  Resp 28  SpO2 63%  Physical Exam  Nursing note and vitals reviewed. Constitutional: She is oriented to person, place, and time. She appears well-developed and well-nourished.  HENT:  Head: Normocephalic and atraumatic.  Right Ear: External ear normal.  Left Ear: External ear normal.  Nose: Nose normal.  Mouth/Throat: Oropharynx is clear and moist. No oropharyngeal exudate.  Eyes: Conjunctivae are normal.  Neck: Normal range of motion. Neck supple.  Cardiovascular: Normal rate, regular rhythm, normal heart sounds and intact distal pulses.  Exam reveals no gallop and no friction rub.   No murmur heard. Pulmonary/Chest: She is in respiratory distress.  Decreased breath sounds diffusely; tachypneic with increased work of breathing  Abdominal: Soft. Bowel sounds are normal. She exhibits no distension and no mass. There is no tenderness. There is no rebound and no guarding.  Musculoskeletal: Normal range of motion. She exhibits no edema and no tenderness.  Neurological: She is alert and oriented to person, place, and time.  Skin: Skin is warm and dry.  Psychiatric: She has a normal mood and affect. Her behavior is normal. Judgment and thought content normal.    ED Course  Procedures (including critical care time)  Labs Reviewed  COMPREHENSIVE METABOLIC PANEL - Abnormal; Notable for the following:    Sodium 133 (*)    Albumin 3.2 (*)    Alkaline Phosphatase 131 (*)    Total  Bilirubin 0.2 (*)    All other components within normal limits  POCT I-STAT 3, BLOOD GAS (G3+) - Abnormal; Notable for the following:    pH, Arterial 7.302 (*)    pCO2 arterial 62.8 (*)    Bicarbonate 31.0 (*)    Acid-Base Excess 3.0 (*)  All other components within normal limits  CBC WITH DIFFERENTIAL  BLOOD GAS, ARTERIAL   Ct Angio Chest Pe W/cm &/or Wo Cm  01/16/2013  *RADIOLOGY REPORT*  Clinical Data: Hemoptysis.  Cough and shortness of breath.  CT ANGIOGRAPHY CHEST  Technique:  Multidetector CT imaging of the chest using the standard protocol during bolus administration of intravenous contrast. Multiplanar reconstructed images including MIPs were obtained and reviewed to evaluate the vascular anatomy.  Contrast: OMNIPAQUE IOHEXOL 350 MG/ML SOLN  Comparison: Chest x-ray dated 01/16/2013 and chest CT dated 04/17/2012  Findings: There is increased density in the right upper lobe posteriorly at the site of the patients of previous tumor.  This is consistent with recurrent tumor.  There is a small focal area of consolidation in the medial segment right middle lobe which is new.  There are no pulmonary emboli.  Azygos lymph node has enlarged from 14.9 to 19.0 mm.  There are minimal linear atelectasis in the left upper lobe in the lingula.  Chronic scarring at the left lung base inferior laterally.  The there is a slight compression fracture of the superior plate of T5, new since 04/17/2012.  I suspect this is an osteoporotic fracture.  No bone destruction.  IMPRESSION:  1.  Increasing density posterior medially in the left upper lobe consistent with recurrent tumor. 2. Focal area of consolidation in the medial aspect of the right middle lobe.  I suspect this is inflammatory. 3.  Enlarging azygos lymph node consistent with metastatic disease. 4. Diffuse emphysema.   Original Report Authenticated By: Francene Boyers, M.D.    Dg Chest Portable 1 View  01/16/2013  *RADIOLOGY REPORT*  Clinical Data:  Shortness of breath.  PORTABLE CHEST - 1 VIEW  Comparison: 09/21/2012  Findings: Heart size and vascularity are normal considering the AP portable technique.  Minimal atelectasis or scarring at the left base laterally.  No infiltrates or effusions.  No acute osseous abnormality.  IMPRESSION: Minimal atelectasis or scarring at the left lung base.   Original Report Authenticated By: Francene Boyers, M.D.     Date: 01/17/2013  Rate: 134 bpm  Rhythm: sinus tachycardia  QRS Axis: Right axis deviation  Intervals: QT prolonged  ST/T Wave abnormalities: nonspecific ST changes  Conduction Disutrbances:none  Narrative Interpretation: Sinus tachycardia; no acute ischemia  Old EKG Reviewed: Atrial fibrillation resolved    1. COPD with acute exacerbation   2. Acute respiratory failure   3. Community acquired pneumonia     MDM  57 yo F w/hx of severe COPD (but not on home oxygen) and bronchoadenocarcinoma presents for dyspnea and wheezing of 2 weeks, worsened today with new associated hemoptysis. Pt hypoxic to 63% on room air, pt responded well to 4L/min [place]. Diminished breath sounds diffusely. Clinical picture most consistent with COPD exacerbation. Not in hospital in past 3 months. Will treat with continuous albuterol, Atrovent, and Rocephin/Azithromycin (community-acquired pneumonia).   CXR without evidence of pneumonia. Patient with some improvement in respiratory status; however, still requiring 4L/min . Due to reported hemoptysis, tachycardia, tachypnea, and hx of cancer, chest CTA obtained but negative for evidence of PE or erosion of tumor into major pulmonary vessels. Internal Medicine to admit.         Clemetine Marker, MD 01/17/13 [phone]

## 2013-01-16 NOTE — ED Notes (Signed)
Per pt sts she is out of her meds and she is SOB and coughing up blood. sts uses O2 at home 2L. sats 60% on RA. Pt not on her O2.

## 2013-01-17 ENCOUNTER — Telehealth: Payer: Self-pay | Admitting: Internal Medicine

## 2013-01-17 ENCOUNTER — Encounter (HOSPITAL_COMMUNITY): Payer: Self-pay | Admitting: Internal Medicine

## 2013-01-17 DIAGNOSIS — I10 Essential (primary) hypertension: Secondary | ICD-10-CM

## 2013-01-17 DIAGNOSIS — J441 Chronic obstructive pulmonary disease with (acute) exacerbation: Secondary | ICD-10-CM

## 2013-01-17 DIAGNOSIS — E871 Hypo-osmolality and hyponatremia: Secondary | ICD-10-CM | POA: Diagnosis present

## 2013-01-17 DIAGNOSIS — E46 Unspecified protein-calorie malnutrition: Secondary | ICD-10-CM | POA: Diagnosis present

## 2013-01-17 DIAGNOSIS — F17200 Nicotine dependence, unspecified, uncomplicated: Secondary | ICD-10-CM

## 2013-01-17 DIAGNOSIS — C3490 Malignant neoplasm of unspecified part of unspecified bronchus or lung: Secondary | ICD-10-CM

## 2013-01-17 DIAGNOSIS — F1910 Other psychoactive substance abuse, uncomplicated: Secondary | ICD-10-CM

## 2013-01-17 DIAGNOSIS — J9600 Acute respiratory failure, unspecified whether with hypoxia or hypercapnia: Secondary | ICD-10-CM

## 2013-01-17 LAB — EXPECTORATED SPUTUM ASSESSMENT W GRAM STAIN, RFLX TO RESP C: Special Requests: NORMAL

## 2013-01-17 LAB — PROTIME-INR: Prothrombin Time: 12.3 seconds (ref 11.6–15.2)

## 2013-01-17 LAB — BASIC METABOLIC PANEL
Calcium: 9.7 mg/dL (ref 8.4–10.5)
GFR calc Af Amer: >90 mL/min (ref >90)
GFR calc non Af Amer: >90 mL/min (ref >90)
Potassium: 4.7 mEq/L (ref 3.5–5.1)
Sodium: 137 mEq/L (ref 135–145)

## 2013-01-17 LAB — PHOSPHORUS: Phosphorus: 4 mg/dL (ref 2.3–4.6)

## 2013-01-17 MED ORDER — LEVALBUTEROL HCL 1.25 MG/0.5ML IN NEBU
1.2500 mg | INHALATION_SOLUTION | Freq: Four times a day (QID) | RESPIRATORY_TRACT | Status: DC
  Administered 2013-01-17 (×3): 1.25 mg via RESPIRATORY_TRACT
  Filled 2013-01-17 (×6): qty 0.5

## 2013-01-17 MED ORDER — MIRTAZAPINE 15 MG PO TABS
15.0000 mg | ORAL_TABLET | Freq: Every day | ORAL | Status: DC
Start: 2013-01-17 — End: 2013-01-18
  Administered 2013-01-17 (×2): 15 mg via ORAL
  Filled 2013-01-17 (×3): qty 1

## 2013-01-17 MED ORDER — LEVALBUTEROL HCL 1.25 MG/0.5ML IN NEBU
1.2500 mg | INHALATION_SOLUTION | Freq: Four times a day (QID) | RESPIRATORY_TRACT | Status: DC
  Administered 2013-01-17 – 2013-01-18 (×3): 1.25 mg via RESPIRATORY_TRACT
  Filled 2013-01-17 (×7): qty 0.5

## 2013-01-17 MED ORDER — PREDNISONE 50 MG PO TABS
60.0000 mg | ORAL_TABLET | Freq: Every day | ORAL | Status: DC
  Administered 2013-01-17 – 2013-01-18 (×2): 60 mg via ORAL
  Filled 2013-01-17 (×4): qty 1

## 2013-01-17 MED ORDER — GUAIFENESIN ER 600 MG PO TB12
600.0000 mg | ORAL_TABLET | Freq: Two times a day (BID) | ORAL | Status: DC
  Administered 2013-01-17 – 2013-01-18 (×4): 600 mg via ORAL
  Filled 2013-01-17 (×5): qty 1

## 2013-01-17 MED ORDER — SODIUM CHLORIDE 0.9 % IV SOLN: 250.0000 mL | INTRAVENOUS | Status: DC | PRN

## 2013-01-17 MED ORDER — METHYLPREDNISOLONE SODIUM SUCC 125 MG IJ SOLR
80.0000 mg | Freq: Every day | INTRAMUSCULAR | Status: DC
  Filled 2013-01-17: qty 1.28

## 2013-01-17 MED ORDER — SUMATRIPTAN SUCCINATE 50 MG PO TABS: 50.0000 mg | ORAL_TABLET | Freq: Once | ORAL | Status: AC | PRN

## 2013-01-17 MED ORDER — LEVALBUTEROL HCL 1.25 MG/0.5ML IN NEBU: 1.2500 mg | INHALATION_SOLUTION | Freq: Four times a day (QID) | RESPIRATORY_TRACT | Status: DC

## 2013-01-17 MED ORDER — AMLODIPINE BESYLATE 5 MG PO TABS
5.0000 mg | ORAL_TABLET | Freq: Every day | ORAL | Status: DC
  Administered 2013-01-17: 5 mg via ORAL
  Filled 2013-01-17 (×2): qty 1

## 2013-01-17 MED ORDER — GUAIFENESIN-DM 100-10 MG/5ML PO SYRP
5.0000 mL | ORAL_SOLUTION | ORAL | Status: DC | PRN
  Administered 2013-01-17 – 2013-01-18 (×2): 5 mL via ORAL
  Filled 2013-01-17 (×2): qty 5

## 2013-01-17 MED ORDER — SODIUM CHLORIDE 0.9 % IJ SOLN: 3.0000 mL | INTRAMUSCULAR | Status: DC | PRN

## 2013-01-17 MED ORDER — FERROUS SULFATE 325 (65 FE) MG PO TABS
325.0000 mg | ORAL_TABLET | Freq: Two times a day (BID) | ORAL | Status: DC
  Administered 2013-01-17 – 2013-01-18 (×3): 325 mg via ORAL
  Filled 2013-01-17 (×4): qty 1

## 2013-01-17 MED ORDER — ACETAMINOPHEN 325 MG PO TABS
650.0000 mg | ORAL_TABLET | Freq: Four times a day (QID) | ORAL | Status: DC | PRN
  Administered 2013-01-18: 650 mg via ORAL
  Filled 2013-01-17: qty 2

## 2013-01-17 MED ORDER — ASPIRIN EC 81 MG PO TBEC
81.0000 mg | DELAYED_RELEASE_TABLET | Freq: Every day | ORAL | Status: DC
  Administered 2013-01-17 – 2013-01-18 (×2): 81 mg via ORAL
  Filled 2013-01-17 (×2): qty 1

## 2013-01-17 MED ORDER — HEPARIN SODIUM (PORCINE) 5000 UNIT/ML IJ SOLN
5000.0000 [IU] | Freq: Three times a day (TID) | INTRAMUSCULAR | Status: DC
  Administered 2013-01-17 – 2013-01-18 (×5): 5000 [IU] via SUBCUTANEOUS
  Filled 2013-01-17 (×7): qty 1

## 2013-01-17 MED ORDER — IPRATROPIUM BROMIDE 0.02 % IN SOLN
0.5000 mg | RESPIRATORY_TRACT | Status: DC
  Administered 2013-01-17 (×3): 0.5 mg via RESPIRATORY_TRACT
  Filled 2013-01-17 (×2): qty 2.5

## 2013-01-17 MED ORDER — IPRATROPIUM BROMIDE 0.02 % IN SOLN
0.5000 mg | Freq: Four times a day (QID) | RESPIRATORY_TRACT | Status: DC
  Administered 2013-01-17 – 2013-01-18 (×3): 0.5 mg via RESPIRATORY_TRACT
  Filled 2013-01-17 (×5): qty 2.5

## 2013-01-17 MED ORDER — ACETAMINOPHEN 650 MG RE SUPP: 650.0000 mg | Freq: Four times a day (QID) | RECTAL | Status: DC | PRN

## 2013-01-17 MED ORDER — ENSURE COMPLETE PO LIQD
237.0000 mL | Freq: Two times a day (BID) | ORAL | Status: DC
  Administered 2013-01-17 – 2013-01-18 (×3): 237 mL via ORAL

## 2013-01-17 MED ORDER — SODIUM CHLORIDE 0.9 % IJ SOLN
3.0000 mL | Freq: Two times a day (BID) | INTRAMUSCULAR | Status: DC
  Administered 2013-01-17 (×2): 3 mL via INTRAVENOUS

## 2013-01-17 MED ORDER — MAGNESIUM SULFATE 40 MG/ML IJ SOLN
2.0000 g | Freq: Once | INTRAMUSCULAR | Status: AC
  Administered 2013-01-17: 2 g via INTRAVENOUS
  Filled 2013-01-17: qty 50

## 2013-01-17 MED ORDER — AZITHROMYCIN 250 MG PO TABS
250.0000 mg | ORAL_TABLET | Freq: Every day | ORAL | Status: DC
  Administered 2013-01-17: 250 mg via ORAL
  Filled 2013-01-17 (×2): qty 1

## 2013-01-17 MED ORDER — KETOROLAC TROMETHAMINE 10 MG PO TABS
10.0000 mg | ORAL_TABLET | Freq: Four times a day (QID) | ORAL | Status: DC | PRN
  Administered 2013-01-18: 10 mg via ORAL
  Filled 2013-01-17: qty 1

## 2013-01-17 MED ORDER — SODIUM CHLORIDE 0.9 % IJ SOLN
3.0000 mL | Freq: Two times a day (BID) | INTRAMUSCULAR | Status: DC
  Administered 2013-01-18: 3 mL via INTRAVENOUS

## 2013-01-17 NOTE — Progress Notes (Signed)
Utilization Review Completed Kaitlan Bin J. Ollivander See, RN, BSN, NCM [phone]  

## 2013-01-17 NOTE — ED Provider Notes (Signed)
Medical screening examination/treatment/procedure(s) were performed by non-physician practitioner and as supervising physician I was immediately available for consultation/collaboration.   Rolan Bucco, MD 01/17/13 [phone]

## 2013-01-17 NOTE — Consult Note (Signed)
Patient: Karina Weber DOB: [birth year] Date of Admission: 01/16/2013            [facility]   Date of Consult: 01/17/2013 MD requesting consult:  IMTS Reason for consult:  COPD, ?recurrent lung ca  HPI - 57yo female active smoker with hx polysubstance abuse,  O2 dependent COPD, RUL non-small cell lung ca s/p radiation in 2011, multiple admits with acute on chronic respiratory failure requiring intubation, medical non-compliance.  Previously seen by Dr. Craige Cotta. She was admitted to IM teaching service 4/16 with AECOPD after presenting with SOB, cough, mild hemoptysis after not taking her medications x 2 weeks.  CT chest revealed increased RUL density and PCCM consulted for ?recurrence of lung ca.    Allergies:  Allergies  Allergen Reactions  . Lisinopril Cough     PMH: Past Medical History  Diagnosis Date  . Tobacco abuse   . COPD (chronic obstructive pulmonary disease)     exacerbagtion in 6/12 and in 2010.   Marland Kitchen Hypertension   . History of radiation therapy 07/19/10 to 07/28/10    RUL lung  . 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 Alcohol abuse     2 beers a day  . Asthma   . Headache   . Chronic hypercapnic respiratory failure   . Polysubstance abuse     Home meds: Medications Prior to Admission  Medication Sig Dispense Refill  . aspirin EC 81 MG tablet Take 81 mg by mouth daily.      . ferrous sulfate 325 (65 FE) MG tablet Take 1 tablet (325 mg total) by mouth 2 (two) times daily.  60 tablet  5  . ketorolac (TORADOL) 10 MG tablet Take 1 tablet (10 mg total) by mouth every 6 (six) hours as needed for pain.  20 tablet  0  . mirtazapine (REMERON) 15 MG tablet Take 1 tablet (15 mg total) by mouth at bedtime.  30 tablet  6  . SUMAtriptan (IMITREX) 50 MG tablet Take 1 tablet (50 mg total) by mouth once as needed for migraine.  30 tablet  2     Social Hx: History   Social History  . Marital Status: Single    Spouse Name: N/A    Number of Children: N/A  . Years of  Education: N/A   Occupational History  . Not on file.   Social History Main Topics  . Smoking status: Current Some Day Smoker -- 0.20 packs/day for 40 years    Types: Cigarettes  . Smokeless tobacco: Never Used     Comment: Cut back from 2 ppd when diagnosed with Lung cancer.12/02/11 > 3 cig/day 07/20/12  . Alcohol Use: No     Comment: Former alcoholic  . Drug Use: Yes    Special: Cocaine, Marijuana  . Sexually Active: Not on file   Other Topics Concern  . Not on file   Social History Narrative   Lives with a roommate (mutual grandmother to grandchildren). She takes care of herself, does not have any assistance when she is sick. He is able to perform ADLs when she is not sick . Used to work in [employer], retired 2 years ago . Education: 9th grade . SHe on Medicaid now.       Smokes 1 pack per day (since age 27 y.o)   Drinks 4 cans of beer every day or week.    Denies drugs though UDS multiple admissions is positive.  As of 01/16/13 still smoking 1 ppd. Smoking since age 16. Drinks 1-4 cans of beer 12 oz cans though patient will not say how frequently throughout the week     Family Hx: Family History  Problem Relation Age of Onset  . Heart disease Neg Hx   . Cancer Mother     lung  . Cancer Father     lung  . Hypertension       ROS: Review of Systems - c/o SOB, non productive cough, chest tightness at times.  Denies chest pain, hemoptysis (now resolved), headache, leg/calf pain, orthopnea, BLE swelling.  All other systems reviewed and were neg.   Filed Vitals:   01/17/13 0105 01/17/13 0225 01/17/13 0614 01/17/13 0855  BP: 168/80  163/80   Pulse: 100  97   Temp: 97.5 F (36.4 C)  98.5 F (36.9 C)   TempSrc: Oral  Oral   Resp: 20  20   Height: 5' (1.524 m)     Weight: 95 lb 8 oz (43.319 kg)  95 lb (43.092 kg)   SpO2: 94% 95% 99% 97%    Radiology--  Ct Angio Chest Pe W/cm &/or Wo Cm  01/16/2013  *RADIOLOGY REPORT*  Clinical Data: Hemoptysis.  Cough and  shortness of breath.  CT ANGIOGRAPHY CHEST  Technique:  Multidetector CT imaging of the chest using the standard protocol during bolus administration of intravenous contrast. Multiplanar reconstructed images including MIPs were obtained and reviewed to evaluate the vascular anatomy.  Contrast: OMNIPAQUE IOHEXOL 350 MG/ML SOLN  Comparison: Chest x-ray dated 01/16/2013 and chest CT dated 04/17/2012  Findings: There is increased density in the right upper lobe posteriorly at the site of the patients of previous tumor.  This is consistent with recurrent tumor.  There is a small focal area of consolidation in the medial segment right middle lobe which is new.  There are no pulmonary emboli.  Azygos lymph node has enlarged from 14.9 to 19.0 mm.  There are minimal linear atelectasis in the left upper lobe in the lingula.  Chronic scarring at the left lung base inferior laterally.  The there is a slight compression fracture of the superior plate of T5, new since 04/17/2012.  I suspect this is an osteoporotic fracture.  No bone destruction.  IMPRESSION:  1.  Increasing density posterior medially in the left upper lobe consistent with recurrent tumor. 2. Focal area of consolidation in the medial aspect of the right middle lobe.  I suspect this is inflammatory. 3.  Enlarging azygos lymph node consistent with metastatic disease. 4. Diffuse emphysema.   Original Report Authenticated By: Francene Boyers, M.D.    Dg Chest Portable 1 View  01/16/2013  *RADIOLOGY REPORT*  Clinical Data: Shortness of breath.  PORTABLE CHEST - 1 VIEW  Comparison: 09/21/2012  Findings: Heart size and vascularity are normal considering the AP portable technique.  Minimal atelectasis or scarring at the left base laterally.  No infiltrates or effusions.  No acute osseous abnormality.  IMPRESSION: Minimal atelectasis or scarring at the left lung base.   Original Report Authenticated By: Francene Boyers, M.D.      CBC    Component Value Date/Time    WBC 7.5 01/16/2013 1857   RBC 4.16 01/16/2013 1857   HGB 12.4 01/16/2013 1857   HCT 38.3 01/16/2013 1857   PLT 199 01/16/2013 1857   MCV 92.1 01/16/2013 1857   MCH 29.8 01/16/2013 1857   MCHC 32.4 01/16/2013 1857   RDW 14.9 01/16/2013 1857  LYMPHSABS 1.5 01/16/2013 1857   MONOABS 0.9 01/16/2013 1857   EOSABS 0.1 01/16/2013 1857   BASOSABS 0.0 01/16/2013 1857     BMET    Component Value Date/Time   NA 137 01/17/2013 0130   K 4.7 01/17/2013 0130   CL 96 01/17/2013 0130   CO2 29 01/17/2013 0130   GLUCOSE 200* 01/17/2013 0130   BUN 11 01/17/2013 0130   CREATININE 0.46* 01/17/2013 0130   CREATININE 0.61 07/23/2012 1647   CALCIUM 9.7 01/17/2013 0130   GFRNONAA >90 01/17/2013 0130   GFRAA >90 01/17/2013 0130     ABG    Component Value Date/Time   PHART 7.302* 01/16/2013 1925   PCO2ART 62.8* 01/16/2013 1925   PO2ART 94.0 01/16/2013 1925   HCO3 31.0* 01/16/2013 1925   TCO2 33 01/16/2013 1925   ACIDBASEDEF 1.0 09/18/2012 1440   O2SAT 96.0 01/16/2013 1925      EXAM: General: thin, chronically ill appearing female, NAD  Neuro: awake, alert, appropriate, MAE  CV:  s1s2 rrr PULM: resps even non labored, currently off O2, diminished throughout, no audible wheeze  GI: abd soft, +bs Extremities:  Warm and dry, no edema    IMPRESSION/ PLAN:  RUL mass -- This is enlarged and at site of previous tumor.  She also has azygos lymph node which has enlarged from 14.9 to 19.0 mm.  Likely recurrence of NSC lung ca- both local & mediastinal.  Previously rx with Xrt in 2011 (Dr. Mitzi Hansen Rad-onc).  Pt did not cont to f/u. PLAN -  Suggest PET scan after discharge FU can be arranged with Dr Craige Cotta &  rad-onc  (moody) Although azygos LN may be amenable to needle aspiration via endobronchial ultrasound,that procedure should be pursued only after  A realistic discussion of treatment options - (more RT & chemotherapy ) Treatment options may be limited given hx of significant non-compliance and failure to f/u - she  says she would like to pursue treatment but can "only do so much".  She "has a lot going on".   AECOPD -- improved  PLAN -  Cont steroids, now tol PO  O2 - also noncompliant with this at times  BD  Smoking cessation    WHITEHEART,KATHRYN, NP 01/17/2013  10:13 AM Pager: [phone]  *Care during the described time interval was provided by me and/or other providers on the critical care team. I have reviewed this patient's available data, including medical history, events of note, physical examination and test results as part of my evaluation.  Independently examined pt, evaluated data & formulated above care plan with NP, modified note & recomm  PCCM to sign off - pl call if more questions   ALVA,RAKESH V.  [phone]

## 2013-01-17 NOTE — Progress Notes (Signed)
Subjective: Says her breathing much better this am, no current chest pain or tightness. Satting 95-98% on [place] overnight.  Abdominal pain unchanged.  Denies N/V, dizziness, diarrhea.  Objective: Vital signs in last 24 hours: Filed Vitals:   01/17/13 0015 01/17/13 0105 01/17/13 0225 01/17/13 0614  BP: 135/68 168/80  163/80  Pulse: 97 100  97  Temp:  97.5 F (36.4 C)  98.5 F (36.9 C)  TempSrc:  Oral  Oral  Resp: 20 20  20   Height:  5' (1.524 m)    Weight:  95 lb 8 oz (43.319 kg)  95 lb (43.092 kg)  SpO2: 95% 94% 95% 99%   Weight change:   Intake/Output Summary (Last 24 hours) at 01/17/13 0840 Last data filed at 01/17/13 0837  Gross per 24 hour  Intake    834 ml  Output    200 ml  Net    634 ml   Vitals reviewed. General: Thin female lying in bed, NAD HEENT: PERRL, EOMI, no scleral icterus Cardiac: Tachycardic to 100 with regular rhythm, no rubs, murmurs or gallops Pulm: Normal WOB. Decreased air entry upper>lower lung fields. Expiratory wheezing anterior lung fields. Abd: soft, nondistended, mild TTP. No rebound or guarding. 2cmX2cm moblie nodule on R abdomen superolaterally to navel Ext: warm and well perfused, no pedal edema Neuro: alert and oriented X3, cranial nerves II-XII grossly intact, strength and sensation to light touch equal in bilateral upper and lower extremities Lymph: No clavicular or axillary LAD appreciated.   Lab Results: Basic Metabolic Panel:  Recent Labs Lab 01/16/13 1857 01/17/13 0130  NA 133* 137  K 4.6 4.7  CL 96 96  CO2 30 29  GLUCOSE 93 200*  BUN 11 11  CREATININE 0.53 0.46*  CALCIUM 9.4 9.7  MG  --  1.5  PHOS  --  4.0   Liver Function Tests:  Recent Labs Lab 01/16/13 1857  AST 17  ALT 9  ALKPHOS 131*  BILITOT 0.2*  PROT 7.3  ALBUMIN 3.2*   CBC:  Recent Labs Lab 01/16/13 1857  WBC 7.5  NEUTROABS 5.0  HGB 12.4  HCT 38.3  MCV 92.1  PLT 199   Cardiac Enzymes:  Recent Labs Lab 01/17/13 0142  TROPONINI <0.30    Coagulation:  Recent Labs Lab 01/17/13 0130  LABPROT 12.3  INR 0.92   Urine Drug Screen: Drugs of Abuse     Component Value Date/Time   LABOPIA NONE DETECTED 01/17/2013 0622   LABOPIA NEGATIVE 07/09/2012 1535   COCAINSCRNUR NONE DETECTED 01/17/2013 0622   COCAINSCRNUR POSITIVE* 07/09/2012 1535   LABBENZ NONE DETECTED 01/17/2013 0622   LABBENZ NEGATIVE 07/09/2012 1535   AMPHETMU NONE DETECTED 01/17/2013 0622   AMPHETMU NEGATIVE 07/09/2012 1535   THCU NONE DETECTED 01/17/2013 0622   LABBARB NONE DETECTED 01/17/2013 0622    Micro Results: Recent Results (from the past 240 hour(s))  CULTURE, EXPECTORATED SPUTUM-ASSESSMENT     Status: None   Collection Time    01/17/13  6:33 AM      Result Value Range Status   Specimen Description SPU   Final   Special Requests Normal   Final   Sputum evaluation     Final   Value: MICROSCOPIC FINDINGS SUGGEST THAT THIS SPECIMEN IS NOT REPRESENTATIVE OF LOWER RESPIRATORY SECRETIONS. PLEASE RECOLLECT.     Gram Stain Report Called to,Read Back By and Verified With: Haney RN 8:20 01/17/13 ([email or web address])   Report Status 01/17/2013 FINAL   Final   Studies/Results: Ct Angio  Chest Pe W/cm &/or Wo Cm  01/16/2013  *RADIOLOGY REPORT*  Clinical Data: Hemoptysis.  Cough and shortness of breath.  CT ANGIOGRAPHY CHEST  Technique:  Multidetector CT imaging of the chest using the standard protocol during bolus administration of intravenous contrast. Multiplanar reconstructed images including MIPs were obtained and reviewed to evaluate the vascular anatomy.  Contrast: OMNIPAQUE IOHEXOL 350 MG/ML SOLN  Comparison: Chest x-ray dated 01/16/2013 and chest CT dated 04/17/2012  Findings: There is increased density in the right upper lobe posteriorly at the site of the patients of previous tumor.  This is consistent with recurrent tumor.  There is a small focal area of consolidation in the medial segment right middle lobe which is new.  There are no pulmonary emboli.  Azygos lymph  node has enlarged from 14.9 to 19.0 mm.  There are minimal linear atelectasis in the left upper lobe in the lingula.  Chronic scarring at the left lung base inferior laterally.  The there is a slight compression fracture of the superior plate of T5, new since 04/17/2012.  I suspect this is an osteoporotic fracture.  No bone destruction.  IMPRESSION:  1.  Increasing density posterior medially in the left upper lobe consistent with recurrent tumor. 2. Focal area of consolidation in the medial aspect of the right middle lobe.  I suspect this is inflammatory. 3.  Enlarging azygos lymph node consistent with metastatic disease. 4. Diffuse emphysema.   Original Report Authenticated By: Francene Boyers, M.D.    Dg Chest Portable 1 View  01/16/2013  *RADIOLOGY REPORT*  Clinical Data: Shortness of breath.  PORTABLE CHEST - 1 VIEW  Comparison: 09/21/2012  Findings: Heart size and vascularity are normal considering the AP portable technique.  Minimal atelectasis or scarring at the left base laterally.  No infiltrates or effusions.  No acute osseous abnormality.  IMPRESSION: Minimal atelectasis or scarring at the left lung base.   Original Report Authenticated By: Francene Boyers, M.D.    Medications: I have reviewed the patient's current medications. Scheduled Meds: . amLODipine  5 mg Oral Daily  . aspirin EC  81 mg Oral Daily  . azithromycin  250 mg Oral Daily  . ferrous sulfate  325 mg Oral BID  . guaiFENesin  600 mg Oral BID  . heparin  5,000 Units Subcutaneous Q8H  . ipratropium  0.5 mg Nebulization Q4H  . levalbuterol  1.25 mg Nebulization Q6H  . magnesium sulfate 1 - 4 g bolus IVPB  2 g Intravenous Once  . methylPREDNISolone (SOLU-MEDROL) injection  80 mg Intravenous Daily  . mirtazapine  15 mg Oral QHS  . sodium chloride  3 mL Intravenous Q12H  . sodium chloride  3 mL Intravenous Q12H   Continuous Infusions:  PRN Meds:.sodium chloride, acetaminophen, acetaminophen, guaiFENesin-dextromethorphan,  ketorolac, sodium chloride, SUMAtriptan  Assessment/Plan:  1. Acute on chronic COPD exacerbation:  Patient initially presented with acute hypercapnic (PaCO2 63) and hypoxic respiratory failure.  Oxygenation improved with administration of NRB and then 4L nasal cannula supplemental oxygen in ED. Symptoms have improved with steroids and scheduled nebulizers. ? Precipitated by NSCLC recurrence of RUL. - Will transition from solumedrol to prednisone 60mg  - Continue scheduled nebs - Azithromycin - Wean supplemental O2  2. 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 Patient was initially followed up with pulmonologist but lost to F/U. She was last seen in pulmonologist office by  NP Tammy Parrett 07/20/12. It seems that she has recurrent lung cancer now as evidenced by CT angiogram today. She also has mild hemoptysis. She has azygos lymph node which has enlarged from 14.9 to 19.0 mm.  -Consult to pulmonologist today, +/- oncology     3. HTN  Blood pressure is 162/99 on admission. She was on bisoprolol at home, but was not compliant to her medications. BP meds on admission changed from bisoprolol to amlodipine 5mg  daily. Has not received BP meds yet this am. AM BP163/80. - Continue amlodipine 5mg   4. Polysubstance abuse:  Patient has been smoking for about 40 years. She is still smoking 1 pack per day recently. She also drinks alcohol, last drink was today (1 12 oz. Beer but drinks 1-4 beers at a time). She has a history of cocaine abuse. Her UDS was positive for cocaine on 07/09/12.  - Consult SW for subs abuse cessation    5. Headache:  She has chronic migraine headache per patient. She is on sumatriptan at home.  - Continue home triptan  6. Protein calorie malnutrition  Albumin 3.2  - nutrition consult  DVT PPX:  Heparin sq   Dispo: Disposition is deferred at this time,  awaiting improvement of current medical problems.  Anticipated discharge in approximately 3 day(s).   The patient does have a current PCP (PRIBULA,CHRISTOPHER, MD), therefore will be requiring OPC follow-up after discharge.   .Services Needed at time of discharge: Y = Yes, Blank = No PT:   OT:   RN:   Equipment:   Other:     LOS: 1 day   Bronson Curb 01/17/2013, 8:40 AM

## 2013-01-17 NOTE — Consult Note (Signed)
HPI: 57 year old with history tobacco use and diagnosis of NSCLC in 2011. She presented with right lobe mass and was treated with radiation therapy. Patient failed to show up to previous medical oncology follow up. She has been overall non compliant and had multiple admits with acute on chronic respiratory failure requiring intubation. She was admitted on 4/16 with COPD excerebration after presenting with SOB, cough, mild hemoptysis after not taking her medications x 2 weeks. CT chest revealed increased RUL density suspicious for cancer relapse.  Patient feels better today and breathing is improving.     Past Medical History  Diagnosis Date  . Tobacco abuse   . COPD (chronic obstructive pulmonary disease)     exacerbagtion in 6/12 and in 2010.   Marland Kitchen Hypertension   . History of radiation therapy 07/19/10 to 07/28/10    RUL lung  . 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 Alcohol abuse     2 beers a day  . Asthma   . Headache   . Chronic hypercapnic respiratory failure   . Polysubstance abuse   :  Past Surgical History  Procedure Laterality Date  . No past surgeries    :  Current facility-administered medications:0.9 %  sodium chloride infusion, 250 mL, Intravenous, PRN, Lorretta Harp, MD;  acetaminophen (TYLENOL) suppository 650 mg, 650 mg, Rectal, Q6H PRN, Lorretta Harp, MD;  acetaminophen (TYLENOL) tablet 650 mg, 650 mg, Oral, Q6H PRN, Lorretta Harp, MD;  amLODipine (NORVASC) tablet 5 mg, 5 mg, Oral, Daily, Lorretta Harp, MD, 5 mg at 01/17/13 1103 aspirin EC tablet 81 mg, 81 mg, Oral, Daily, Lorretta Harp, MD, 81 mg at 01/17/13 1107;  azithromycin [facility]) tablet 250 mg, 250 mg, Oral, Daily, Lorretta Harp, MD, 250 mg at 01/17/13 1104;  feeding supplement (ENSURE COMPLETE) liquid 237 mL, 237 mL, Oral, BID BM, Tonye Becket, RD;  ferrous sulfate tablet 325 mg, 325 mg, Oral, BID, Lorretta Harp, MD, 325 mg at 01/17/13 1103 guaiFENesin (MUCINEX) 12 hr tablet 600 mg, 600 mg, Oral, BID, Lorretta Harp, MD,  600 mg at 01/17/13 1103;  guaiFENesin-dextromethorphan (ROBITUSSIN DM) 100-10 MG/5ML syrup 5 mL, 5 mL, Oral, Q4H PRN, Lorretta Harp, MD;  heparin injection 5,000 Units, 5,000 Units, Subcutaneous, Q8H, Lorretta Harp, MD, 5,000 Units at 01/17/13 0701;  ipratropium (ATROVENT) nebulizer solution 0.5 mg, 0.5 mg, Nebulization, Q6H, Jonah Blue, DO ketorolac (TORADOL) tablet 10 mg, 10 mg, Oral, Q6H PRN, Lorretta Harp, MD;  levalbuterol Pauline Aus) nebulizer solution 1.25 mg, 1.25 mg, Nebulization, Q6H, Alejandro Paya, DO;  mirtazapine (REMERON) tablet 15 mg, 15 mg, Oral, QHS, Lorretta Harp, MD, 15 mg at 01/17/13 0149;  predniSONE (DELTASONE) tablet 60 mg, 60 mg, Oral, Q breakfast, Bronson Curb, MD, 60 mg at 01/17/13 1103 sodium chloride 0.9 % injection 3 mL, 3 mL, Intravenous, Q12H, Lorretta Harp, MD, 3 mL at 01/17/13 0150;  sodium chloride 0.9 % injection 3 mL, 3 mL, Intravenous, Q12H, Lorretta Harp, MD;  sodium chloride 0.9 % injection 3 mL, 3 mL, Intravenous, PRN, Lorretta Harp, MD;  SUMAtriptan (IMITREX) tablet 50 mg, 50 mg, Oral, Once PRN, Lorretta Harp, MD:  . amLODipine  5 mg Oral Daily  . aspirin EC  81 mg Oral Daily  . azithromycin  250 mg Oral Daily  . feeding supplement  237 mL Oral BID BM  . ferrous sulfate  325 mg Oral BID  . guaiFENesin  600 mg Oral BID  . heparin  5,000 Units Subcutaneous  Q8H  . ipratropium  0.5 mg Nebulization Q6H  . levalbuterol  1.25 mg Nebulization Q6H  . mirtazapine  15 mg Oral QHS  . predniSONE  60 mg Oral Q breakfast  . sodium chloride  3 mL Intravenous Q12H  . sodium chloride  3 mL Intravenous Q12H  :  Allergies  Allergen Reactions  . Lisinopril Cough  :  Family History  Problem Relation Age of Onset  . Heart disease Neg Hx   . Cancer Mother     lung  . Cancer Father     lung  . Hypertension    :  History   Social History  . Marital Status: Single    Spouse Name: N/A    Number of Children: N/A  . Years of Education: N/A   Occupational History  . Not on file.   Social  History Main Topics  . Smoking status: Current Some Day Smoker -- 0.20 packs/day for 40 years    Types: Cigarettes  . Smokeless tobacco: Never Used     Comment: Cut back from 2 ppd when diagnosed with Lung cancer.12/02/11 > 3 cig/day 07/20/12  . Alcohol Use: No     Comment: Former alcoholic  . Drug Use: Yes    Special: Cocaine, Marijuana  . Sexually Active: Not on file   Other Topics Concern  . Not on file   Social History Narrative   Lives with a roommate (mutual grandmother to grandchildren). She takes care of herself, does not have any assistance when she is sick. He is able to perform ADLs when she is not sick . Used to work in [employer], retired 2 years ago . Education: 9th grade . SHe on Medicaid now.       Smokes 1 pack per day (since age 58 y.o)   Drinks 4 cans of beer every day or week.    Denies drugs though UDS multiple admissions is positive.        As of 01/16/13 still smoking 1 ppd. Smoking since age 46. Drinks 1-4 cans of beer 12 oz cans though patient will not say how frequently throughout the week  :  Pertinent items are noted in HPI.  Exam: Patient Vitals for the past 24 hrs:  BP Temp Temp src Pulse Resp SpO2 Height Weight  01/17/13 1257 - - - - - 84 % - -  01/17/13 1103 162/78 mmHg - - - - - - -  01/17/13 0855 - - - - - 97 % - -  01/17/13 0614 163/80 mmHg 98.5 F (36.9 C) Oral 97 20 99 % - 95 lb (43.092 kg)  01/17/13 0225 - - - - - 95 % - -  01/17/13 0105 168/80 mmHg 97.5 F (36.4 C) Oral 100 20 94 % 5' (1.524 m) 95 lb 8 oz (43.319 kg)  01/17/13 0015 135/68 mmHg - - 97 20 95 % - -  01/16/13 2330 156/79 mmHg - - 109 21 93 % - -  01/16/13 2253 154/72 mmHg 98.3 F (36.8 C) Oral 112 15 - - -  01/16/13 2250 - - - - - 98 % - -  01/16/13 2248 - - - - - 57 % - -  01/16/13 2030 151/122 mmHg - - 124 22 89 % - -  01/16/13 1945 157/74 mmHg - - 111 21 99 % - -  01/16/13 1930 149/79 mmHg - - 111 18 100 % - -  01/16/13 1900 178/96 mmHg - - 118 25 93 % - -  01/16/13  1822 162/99 mmHg 98.5 F (36.9 C) - 143 28 63 % - -   General appearance: alert, cooperative and cachectic Head: Normocephalic, without obvious abnormality, atraumatic Throat: lips, mucosa, and tongue normal; teeth and gums normal Resp: rhonchi bilaterally Cardio: regular rate and rhythm, S1, S2 normal, no murmur, click, rub or gallop GI: soft, non-tender; bowel sounds normal; no masses,  no organomegaly Extremities: extremities normal, atraumatic, no cyanosis or edema Skin: Skin color, texture, turgor normal. No rashes or lesions Neurologic: Grossly normal   Recent Labs  01/16/13 1857  WBC 7.5  HGB 12.4  HCT 38.3  PLT 199    Recent Labs  01/16/13 1857 01/17/13 0130  NA 133* 137  K 4.6 4.7  CL 96 96  CO2 30 29  GLUCOSE 93 200*  BUN 11 11  CREATININE 0.53 0.46*  CALCIUM 9.4 9.7   Lab Results  Component Value Date   WBC 7.5 01/16/2013   HGB 12.4 01/16/2013   HCT 38.3 01/16/2013   MCV 92.1 01/16/2013   PLT 199 01/16/2013    Recent Labs Lab 01/16/13 1857 01/17/13 0130  NA 133* 137  K 4.6 4.7  CL 96 96  CO2 30 29  BUN 11 11  CREATININE 0.53 0.46*  CALCIUM 9.4 9.7  PROT 7.3  --   BILITOT 0.2*  --   ALKPHOS 131*  --   ALT 9  --   AST 17  --   GLUCOSE 93 200*    Ct Angio Chest Pe W/cm &/or Wo Cm  01/16/2013  *RADIOLOGY REPORT*  Clinical Data: Hemoptysis.  Cough and shortness of breath.  CT ANGIOGRAPHY CHEST  Technique:  Multidetector CT imaging of the chest using the standard protocol during bolus administration of intravenous contrast. Multiplanar reconstructed images including MIPs were obtained and reviewed to evaluate the vascular anatomy.  Contrast: OMNIPAQUE IOHEXOL 350 MG/ML SOLN  Comparison: Chest x-ray dated 01/16/2013 and chest CT dated 04/17/2012  Findings: There is increased density in the right upper lobe posteriorly at the site of the patients of previous tumor.  This is consistent with recurrent tumor.  There is a small focal area of  consolidation in the medial segment right middle lobe which is new.  There are no pulmonary emboli.  Azygos lymph node has enlarged from 14.9 to 19.0 mm.  There are minimal linear atelectasis in the left upper lobe in the lingula.  Chronic scarring at the left lung base inferior laterally.  The there is a slight compression fracture of the superior plate of T5, new since 04/17/2012.  I suspect this is an osteoporotic fracture.  No bone destruction.  IMPRESSION:  1.  Increasing density posterior medially in the left upper lobe consistent with recurrent tumor. 2. Focal area of consolidation in the medial aspect of the right middle lobe.  I suspect this is inflammatory. 3.  Enlarging azygos lymph node consistent with metastatic disease. 4. Diffuse emphysema.   Original Report Authenticated By: Francene Boyers, M.D.    Dg Chest Portable 1 View  01/16/2013  *RADIOLOGY REPORT*  Clinical Data: Shortness of breath.  PORTABLE CHEST - 1 VIEW  Comparison: 09/21/2012  Findings: Heart size and vascularity are normal considering the AP portable technique.  Minimal atelectasis or scarring at the left base laterally.  No infiltrates or effusions.  No acute osseous abnormality.  IMPRESSION: Minimal atelectasis or scarring at the left lung base.   Original Report Authenticated By: Francene Boyers, M.D.  Assessment and Plan:   57 year old with history of early stage lung cancer in 2011 and S/P radiation therapy. Now, presents with COPD exacerbation and an enlarging right upper lobe mass and possible relapse with possible enlargement in the azygos lymph node (19 mm from 14 mm).  At this point, the patient is welling to follow up and comply with her appointments (at least states so).  I would recommend out patient Medical Oncology follow up and Radiation Oncology follow up.  She will need PET/CT as out patient.  I would not biopsy these lymph nodes yet unless treatment is planned.  No further Medical Oncology input or  treatment is needed as inpatient at this time.  Please call with questions.

## 2013-01-17 NOTE — Telephone Encounter (Deleted)
Patient's wife called tonight and stated that the patient has severe constipation and abdominal pain. I could not talk to patient directly because of his hearing loss. According to patient's wife, patient's last bowel movement was one week ago. Currently he has abdominal pain. It is located at the lower abdomen, 10 out of 10 in severity, sharp, crossing the whole lower abdomen. Patient does not have nausea or vomiting. He does not have fever or chills. Denies any chest pain or palpitation. I advised patient's wife that he needed to come to the emergency room for further evaluation given his severe abdominal pain. His wife verbally understood and agreed to do so.   Daysia Vandenboom, MD  PGY2, Internal Medicine Teaching Service  Pager: [phone]  

## 2013-01-17 NOTE — H&P (Signed)
INTERNAL MEDICINE TEACHING SERVICE Attending Admission Note  Date: 01/17/2013  Patient name: Karina Weber  Medical record number: [id number]  Date of birth: [birth year]    I have seen and evaluated Vanessa Jourdanton and discussed their care with the Residency Team.  56 yr. Old female with hx COPD, chronic hypercapnic hypoxic respiratory failure, NSCLC s/p XRT, polysubstance abuse, presented due to CP, hemoptysis, and SOB.  She stated the hemoptysis was associated with some productive sputum.  She states she has not been able to take her medications in the past two weeks.   She was started on nebulizer treatment in the ED and received Rocephin/azithromycin but was noted to desaturate into the 50% range and placed on a NRB. This was slowly transitioned to 4 L [place]. CTA of the chest was performed which showed a posterior density in the LUL and enlarged azygos lymph node consistent with recurrence and metastatic disease.  She had evidence of diffuse emphysema. ABG obtained showed a pH 7.30, CO2 of 63 and pO2 of 94 but O2 was not recorded.   Overnight she states she has felt better. Denies any further sputum production or any hemoptysis.  Her O2 sat has been 97% on 4L [place] and noted to decrease to 84% on RA.   Physical Exam: Blood pressure 162/78, pulse 97, temperature 98.5 F (36.9 C), temperature source Oral, resp. rate 20, height 5' (1.524 m), weight 95 lb (43.092 kg), SpO2 84.00%.  General: Vital signs reviewed and noted. Well-developed, well-nourished, in no acute distress; alert, appropriate and cooperative throughout examination.  Head: Normocephalic, atraumatic.  Eyes: PERRL, EOMI, No signs of anemia or jaundince.  Nose: Mucous membranes moist, not inflammed, nonerythematous.  Throat: Oropharynx nonerythematous, no exudate appreciated.   Neck: No deformities, masses, or tenderness noted.Supple, No carotid Bruits, no JVD.  Lungs:  Decreased air movement, some exp wheezing noted upper lobes.   Heart: RRR. S1 and S2 normal without gallop, murmur, or rubs.  Abdomen:  BS normoactive. Soft, Nondistended, non-tender.  No masses or organomegaly. She has a palpable 2x2 cm mobile nodule superior to navel.  Extremities: No pretibial edema.  Neurologic: A&O X3, CN II - XII are grossly intact. Motor strength is 5/5 in the all 4 extremities, Sensations intact to light touch, Cerebellar signs negative.  Skin: No visible rashes, scars.    Lab results: Results for orders placed during the hospital encounter of 01/16/13 (from the past 24 hour(s))  CBC WITH DIFFERENTIAL     Status: None   Collection Time    01/16/13  6:57 PM      Result Value Range   WBC 7.5  4.0 - 10.5 K/uL   RBC 4.16  3.87 - 5.11 MIL/uL   Hemoglobin 12.4  12.0 - 15.0 g/dL   HCT 78.2  95.6 - 21.3 %   MCV 92.1  78.0 - 100.0 fL   MCH 29.8  26.0 - 34.0 pg   MCHC 32.4  30.0 - 36.0 g/dL   RDW 08.6  57.8 - 46.9 %   Platelets 199  150 - 400 K/uL   Neutrophils Relative 66  43 - 77 %   Neutro Abs 5.0  1.7 - 7.7 K/uL   Lymphocytes Relative 19  12 - 46 %   Lymphs Abs 1.5  0.7 - 4.0 K/uL   Monocytes Relative 12  3 - 12 %   Monocytes Absolute 0.9  0.1 - 1.0 K/uL   Eosinophils Relative 2  0 - 5 %  Eosinophils Absolute 0.1  0.0 - 0.7 K/uL   Basophils Relative 0  0 - 1 %   Basophils Absolute 0.0  0.0 - 0.1 K/uL  COMPREHENSIVE METABOLIC PANEL     Status: Abnormal   Collection Time    01/16/13  6:57 PM      Result Value Range   Sodium 133 (*) 135 - 145 mEq/L   Potassium 4.6  3.5 - 5.1 mEq/L   Chloride 96  96 - 112 mEq/L   CO2 30  19 - 32 mEq/L   Glucose, Bld 93  70 - 99 mg/dL   BUN 11  6 - 23 mg/dL   Creatinine, Ser 2.13  0.50 - 1.10 mg/dL   Calcium 9.4  8.4 - 08.6 mg/dL   Total Protein 7.3  6.0 - 8.3 g/dL   Albumin 3.2 (*) 3.5 - 5.2 g/dL   AST 17  0 - 37 U/L   ALT 9  0 - 35 U/L   Alkaline Phosphatase 131 (*) 39 - 117 U/L   Total Bilirubin 0.2 (*) 0.3 - 1.2 mg/dL   GFR calc non Af Amer >90  >90 mL/min   GFR calc Af  Amer >90  >90 mL/min  POCT I-STAT 3, BLOOD GAS (G3+)     Status: Abnormal   Collection Time    01/16/13  7:25 PM      Result Value Range   pH, Arterial 7.302 (*) 7.350 - 7.450   pCO2 arterial 62.8 (*) 35.0 - 45.0 mmHg   pO2, Arterial 94.0  80.0 - 100.0 mmHg   Bicarbonate 31.0 (*) 20.0 - 24.0 mEq/L   TCO2 33  0 - 100 mmol/L   O2 Saturation 96.0     Acid-Base Excess 3.0 (*) 0.0 - 2.0 mmol/L   Patient temperature 98.6 F     Collection site RADIAL, ALLEN'S TEST ACCEPTABLE     Drawn by RT     Sample type ARTERIAL     Comment NOTIFIED PHYSICIAN    BASIC METABOLIC PANEL     Status: Abnormal   Collection Time    01/17/13  1:30 AM      Result Value Range   Sodium 137  135 - 145 mEq/L   Potassium 4.7  3.5 - 5.1 mEq/L   Chloride 96  96 - 112 mEq/L   CO2 29  19 - 32 mEq/L   Glucose, Bld 200 (*) 70 - 99 mg/dL   BUN 11  6 - 23 mg/dL   Creatinine, Ser 5.78 (*) 0.50 - 1.10 mg/dL   Calcium 9.7  8.4 - 46.9 mg/dL   GFR calc non Af Amer >90  >90 mL/min   GFR calc Af Amer >90  >90 mL/min  MAGNESIUM     Status: None   Collection Time    01/17/13  1:30 AM      Result Value Range   Magnesium 1.5  1.5 - 2.5 mg/dL  PHOSPHORUS     Status: None   Collection Time    01/17/13  1:30 AM      Result Value Range   Phosphorus 4.0  2.3 - 4.6 mg/dL  PROTIME-INR     Status: None   Collection Time    01/17/13  1:30 AM      Result Value Range   Prothrombin Time 12.3  11.6 - 15.2 seconds   INR 0.92  0.00 - 1.49  TROPONIN I     Status: None   Collection Time  01/17/13  1:42 AM      Result Value Range   Troponin I <0.30  <0.30 ng/mL  URINE RAPID DRUG SCREEN (HOSP PERFORMED)     Status: None   Collection Time    01/17/13  6:22 AM      Result Value Range   Opiates NONE DETECTED  NONE DETECTED   Cocaine NONE DETECTED  NONE DETECTED   Benzodiazepines NONE DETECTED  NONE DETECTED   Amphetamines NONE DETECTED  NONE DETECTED   Tetrahydrocannabinol NONE DETECTED  NONE DETECTED   Barbiturates NONE  DETECTED  NONE DETECTED  CULTURE, EXPECTORATED SPUTUM-ASSESSMENT     Status: None   Collection Time    01/17/13  6:33 AM      Result Value Range   Specimen Description SPU     Special Requests Normal     Sputum evaluation       Value: MICROSCOPIC FINDINGS SUGGEST THAT THIS SPECIMEN IS NOT REPRESENTATIVE OF LOWER RESPIRATORY SECRETIONS. PLEASE RECOLLECT.     Gram Stain Report Called to,Read Back By and Verified With: Haney RN 8:20 01/17/13 ([email or web address])   Report Status 01/17/2013 FINAL      Imaging results:  Ct Angio Chest Pe W/cm &/or Wo Cm  01/16/2013  *RADIOLOGY REPORT*  Clinical Data: Hemoptysis.  Cough and shortness of breath.  CT ANGIOGRAPHY CHEST  Technique:  Multidetector CT imaging of the chest using the standard protocol during bolus administration of intravenous contrast. Multiplanar reconstructed images including MIPs were obtained and reviewed to evaluate the vascular anatomy.  Contrast: OMNIPAQUE IOHEXOL 350 MG/ML SOLN  Comparison: Chest x-ray dated 01/16/2013 and chest CT dated 04/17/2012  Findings: There is increased density in the right upper lobe posteriorly at the site of the patients of previous tumor.  This is consistent with recurrent tumor.  There is a small focal area of consolidation in the medial segment right middle lobe which is new.  There are no pulmonary emboli.  Azygos lymph node has enlarged from 14.9 to 19.0 mm.  There are minimal linear atelectasis in the left upper lobe in the lingula.  Chronic scarring at the left lung base inferior laterally.  The there is a slight compression fracture of the superior plate of T5, new since 04/17/2012.  I suspect this is an osteoporotic fracture.  No bone destruction.  IMPRESSION:  1.  Increasing density posterior medially in the left upper lobe consistent with recurrent tumor. 2. Focal area of consolidation in the medial aspect of the right middle lobe.  I suspect this is inflammatory. 3.  Enlarging azygos lymph node consistent  with metastatic disease. 4. Diffuse emphysema.   Original Report Authenticated By: Francene Boyers, M.D.    Dg Chest Portable 1 View  01/16/2013  *RADIOLOGY REPORT*  Clinical Data: Shortness of breath.  PORTABLE CHEST - 1 VIEW  Comparison: 09/21/2012  Findings: Heart size and vascularity are normal considering the AP portable technique.  Minimal atelectasis or scarring at the left base laterally.  No infiltrates or effusions.  No acute osseous abnormality.  IMPRESSION: Minimal atelectasis or scarring at the left lung base.   Original Report Authenticated By: Francene Boyers, M.D.      Assessment and Plan: I agree with the formulated Assessment and Plan with the following changes: 56 yr. Old female with hx COPD, chronic hypercapnic hypoxic respiratory failure, NSCLC s/p XRT, polysubstance abuse, presented due to CP, hemoptysis, and SOB, with acute COPD exacerbation and recurrence of NSCLC. 1) AECOPD: Agree with oral prednisone.  Continue nebulizer tx. Wean O2 as tolerated.  Continue azithromycin. 2) hx NSCLC: this is likely recurrence. Consult oncology/pulmonary. 3) rest per resident note.   The treatment plan was discussed in detail with the patient.  Alternatives to treatment, side effects, risks and benefits, and complications were discussed with the patient. Informed consent was obtained. The patient agrees to proceed with the current treatment plan.  Jonah Blue, DO 4/17/20141:31 PM

## 2013-01-17 NOTE — Progress Notes (Signed)
Pt has Medicare and Medicaid, will refer pt to [facility] and [facility] $4 meds prescription lists. Anayelli Lai J. Lucretia Roers, RN, BSN, [employer] [phone]

## 2013-01-17 NOTE — Telephone Encounter (Signed)
This encounter was wrongly opened.   Lorretta Harp, MD PGY2, Internal Medicine Teaching Service Pager: [phone]

## 2013-01-17 NOTE — Progress Notes (Signed)
INITIAL NUTRITION ASSESSMENT  DOCUMENTATION CODES Per approved criteria  -Not Applicable   INTERVENTION: 1. Ensure Complete po BID, each supplement provides 350 kcal and 13 grams of protein. 2. If pt seeks treatment at the [facility], recommend dietitian referral for assistance with obtaining supplements    NUTRITION DIAGNOSIS: Increased nutrient needs  related to Lung Ca as evidenced by weight loss with good appetite.   Goal: PO intake to meet >/=90% estimated nutrition needs  Monitor:  PO intake, weight trends, labs  Reason for Assessment: Malnutrition Screening Tool  57 y.o. female  Admitting Dx: COPD with acute exacerbation  ASSESSMENT: Pt with hx of COPD and lung Ca, admitted with COPD exacerbation. Possible reoccurrence of lung Ca per pulm MD.  Pt reports good appetite, but hx of weight loss. States she did weigh about 120 lbs, but doesn't know when. Wants Ensure, unable to afford it at home.   Height: Ht Readings from Last 1 Encounters:  01/17/13 5' (1.524 m)    Weight: Wt Readings from Last 1 Encounters:  01/17/13 95 lb (43.092 kg)    Ideal Body Weight: 100 lbs   % Ideal Body Weight: 95%  Wt Readings from Last 10 Encounters:  01/17/13 95 lb (43.092 kg)  09/24/12 100 lb 15.5 oz (45.8 kg)  07/23/12 106 lb 12.8 oz (48.444 kg)  07/20/12 104 lb 6.4 oz (47.356 kg)  07/12/12 101 lb 6.4 oz (45.995 kg)  06/04/12 168 lb 3.4 oz (76.3 kg)  04/23/12 99 lb 11.2 oz (45.224 kg)  04/17/12 104 lb 8 oz (47.4 kg)  12/28/11 94 lb 2.2 oz (42.7 kg)  12/02/11 92 lb 11.2 oz (42.048 kg)    Usual Body Weight: ~105 lbs per weight hx   % Usual Body Weight: 90%  BMI:  Body mass index is 18.55 kg/(m^2). Underweight   Estimated Nutritional Needs: Kcal: 1400-1600 Protein: 55-65 gm  Fluid: >/= 1.5 L   Skin: intact   Diet Order: Sodium Restricted  EDUCATION NEEDS: -No education needs identified at this time   Intake/Output Summary (Last 24 hours) at 01/17/13  1143 Last data filed at 01/17/13 0837  Gross per 24 hour  Intake    834 ml  Output    200 ml  Net    634 ml    Last BM: PTA    Labs:   Recent Labs Lab 01/16/13 1857 01/17/13 0130  NA 133* 137  K 4.6 4.7  CL 96 96  CO2 30 29  BUN 11 11  CREATININE 0.53 0.46*  CALCIUM 9.4 9.7  MG  --  1.5  PHOS  --  4.0  GLUCOSE 93 200*    CBG (last 3)  No results found for this basename: GLUCAP,  in the last 72 hours  Scheduled Meds: . amLODipine  5 mg Oral Daily  . aspirin EC  81 mg Oral Daily  . azithromycin  250 mg Oral Daily  . ferrous sulfate  325 mg Oral BID  . guaiFENesin  600 mg Oral BID  . heparin  5,000 Units Subcutaneous Q8H  . ipratropium  0.5 mg Nebulization Q4H  . levalbuterol  1.25 mg Nebulization Q6H  . mirtazapine  15 mg Oral QHS  . predniSONE  60 mg Oral Q breakfast  . sodium chloride  3 mL Intravenous Q12H  . sodium chloride  3 mL Intravenous Q12H    Continuous Infusions:   Past Medical History  Diagnosis Date  . Tobacco abuse   . COPD (  chronic obstructive pulmonary disease)     exacerbagtion in 6/12 and in 2010.   Marland Kitchen Hypertension   . History of radiation therapy 07/19/10 to 07/28/10    RUL lung  . 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 Alcohol abuse     2 beers a day  . Asthma   . Headache   . Chronic hypercapnic respiratory failure   . Polysubstance abuse     Past Surgical History  Procedure Laterality Date  . No past surgeries      Clarene Duke RD, LDN Pager [phone] After Hours pager [phone]

## 2013-01-18 LAB — CBC WITH DIFFERENTIAL/PLATELET
Basophils Absolute: 0 10*3/uL (ref 0.0–0.1)
Basophils Relative: 0 % (ref 0–1)
Eosinophils Absolute: 0 10*3/uL (ref 0.0–0.7)
Lymphs Abs: 1.5 10*3/uL (ref 0.7–4.0)
MCH: 30.3 pg (ref 26.0–34.0)
Neutrophils Relative %: 70 % (ref 43–77)
Platelets: 269 10*3/uL (ref 150–400)
RBC: 4.55 MIL/uL (ref 3.87–5.11)
RDW: 14.7 % (ref 11.5–15.5)
WBC: 8 10*3/uL (ref 4.0–10.5)

## 2013-01-18 LAB — BASIC METABOLIC PANEL
GFR calc Af Amer: >90 mL/min (ref >90)
GFR calc non Af Amer: >90 mL/min (ref >90)
Glucose, Bld: 101 mg/dL — ABNORMAL HIGH (ref 70–99)
Potassium: 5.7 mEq/L — ABNORMAL HIGH (ref 3.5–5.1)
Sodium: 139 mEq/L (ref 135–145)

## 2013-01-18 MED ORDER — LEVALBUTEROL HCL 1.25 MG/0.5ML IN NEBU: 1.2500 mg | INHALATION_SOLUTION | Freq: Four times a day (QID) | RESPIRATORY_TRACT | Status: DC

## 2013-01-18 MED ORDER — PREDNISONE 20 MG PO TABS: ORAL_TABLET | ORAL | Status: DC

## 2013-01-18 MED ORDER — IPRATROPIUM BROMIDE 0.02 % IN SOLN: 0.5000 mg | Freq: Four times a day (QID) | RESPIRATORY_TRACT | Status: DC

## 2013-01-18 MED ORDER — AMLODIPINE BESYLATE 10 MG PO TABS
10.0000 mg | ORAL_TABLET | Freq: Every day | ORAL | Status: DC
  Administered 2013-01-18: 10 mg via ORAL
  Filled 2013-01-18: qty 1

## 2013-01-18 MED ORDER — BISOPROLOL FUMARATE 10 MG PO TABS: 10.0000 mg | ORAL_TABLET | Freq: Every day | ORAL | Status: DC

## 2013-01-18 MED ORDER — SODIUM POLYSTYRENE SULFONATE 15 GM/60ML PO SUSP
30.0000 g | Freq: Once | ORAL | Status: DC
  Filled 2013-01-18: qty 120

## 2013-01-18 MED ORDER — ENSURE COMPLETE PO LIQD: 237.0000 mL | Freq: Two times a day (BID) | ORAL | Status: DC

## 2013-01-18 MED ORDER — ALBUTEROL SULFATE HFA 108 (90 BASE) MCG/ACT IN AERS: 2.0000 | INHALATION_SPRAY | RESPIRATORY_TRACT | Status: DC | PRN

## 2013-01-18 MED ORDER — FLUTICASONE-SALMETEROL 500-50 MCG/DOSE IN AEPB: 1.0000 | INHALATION_SPRAY | Freq: Two times a day (BID) | RESPIRATORY_TRACT | Status: DC

## 2013-01-18 NOTE — Progress Notes (Signed)
INTERNAL MEDICINE TEACHING SERVICE Attending Note  Date: 01/18/2013  Patient name: Karina Weber  Medical record number: [id number]  Date of birth: 11-25-1955    This patient has been seen and discussed with the house staff. Please see their note for complete details. I concur with their findings with the following additions/corrections: Feels better. No hemoptysis.  SOB is improved. She can be discharged on 60 mg prednisone as you are doing for 5 more days. No need for further abx. She will need outpatient oncology and rad-onc f/u. She is aware of this and knows she has recurrence of NSCLC likely. I emphasized importance of follow up. Educated her on tobacco cessation.  The treatment plan was discussed in detail with the patient.  Alternatives to treatment, side effects, risks and benefits, and complications were discussed with the patient. Informed consent was obtained. The patient agrees to proceed with the current treatment plan.  Jonah Blue, DO  01/18/2013, 2:37 PM

## 2013-01-18 NOTE — Progress Notes (Signed)
Karina Weber to be D/C'd Home per MD order.  Discussed with the patient and all questions fully answered.    Medication List    STOP taking these medications       levofloxacin 500 MG tablet  Commonly known as:  LEVAQUIN     tiotropium 18 MCG inhalation capsule  Commonly known as:  SPIRIVA HANDIHALER      TAKE these medications       albuterol 108 (90 BASE) MCG/ACT inhaler  Commonly known as:  VENTOLIN HFA  Inhale 2 puffs into the lungs every 4 (four) hours as needed for wheezing or shortness of breath.     aspirin EC 81 MG tablet  Take 81 mg by mouth daily.     bisoprolol 10 MG tablet  Commonly known as:  ZEBETA  Take 1 tablet (10 mg total) by mouth daily.     feeding supplement Liqd  Take 237 mLs by mouth 2 (two) times daily between meals.     ferrous sulfate 325 (65 FE) MG tablet  Take 1 tablet (325 mg total) by mouth 2 (two) times daily.     Fluticasone-Salmeterol 500-50 MCG/DOSE Aepb  Commonly known as:  ADVAIR  Inhale 1 puff into the lungs every 12 (twelve) hours.     ipratropium 0.02 % nebulizer solution  Commonly known as:  ATROVENT  Take 2.5 mLs (0.5 mg total) by nebulization every 6 (six) hours.     ketorolac 10 MG tablet  Commonly known as:  TORADOL  Take 1 tablet (10 mg total) by mouth every 6 (six) hours as needed for pain.     levalbuterol 1.25 MG/0.5ML nebulizer solution  Commonly known as:  XOPENEX  Take 1.25 mg by nebulization every 6 (six) hours.     mirtazapine 15 MG tablet  Commonly known as:  REMERON  Take 1 tablet (15 mg total) by mouth at bedtime.     predniSONE 20 MG tablet  Commonly known as:  DELTASONE  Take 3 tablets by mouth daily for 6 more days.     SUMAtriptan 50 MG tablet  Commonly known as:  IMITREX  Take 1 tablet (50 mg total) by mouth once as needed for migraine.        VVS, Skin clean, dry and intact without evidence of skin break down, no evidence of skin tears noted. IV catheter discontinued intact. Site without  signs and symptoms of complications. Dressing and pressure applied.  An After Visit Summary was printed and given to the patient. Patient escorted via WC, and D/C home via private auto.  [name] 01/18/2013 5:42 PM

## 2013-01-18 NOTE — Care Management Note (Signed)
    Page 1 of 2   01/18/2013     11:06:04 AM   CARE MANAGEMENT NOTE 01/18/2013  Patient:  Karina Weber, Karina Weber   Account Number:  [id number]  Date Initiated:  01/18/2013  Documentation initiated by:  [facility]  Subjective/Objective Assessment:   57 yo female with hx COPD, chronic hypercapnic hypoxic respiratory failure, NSCLC s/p XRT, polysubstance abuse, presented r/t CP, hemoptysis, and SOB/ admitted with c/p sob     Action/Plan:   Home alone/ Home with home health   Anticipated DC Date:  01/18/2013   Anticipated DC Plan:  HOME W HOME HEALTH SERVICES      DC Planning Services  CM consult      [facility] Choice  HOME HEALTH   Choice offered to / List presented to:  C-1 Patient        HH arranged  HH-1 RN  HH-10 DISEASE MANAGEMENT      HH agency  [facility]   Status of service:  Completed, signed off Medicare Important Message given?   (If response is "NO", the following Medicare IM given date fields will be blank) Date Medicare IM given:   Date Additional Medicare IM given:    Discharge Disposition:    Per UR Regulation:  Reviewed for med. necessity/level of care/duration of stay  If discussed at Long Length of Stay Meetings, dates discussed:    Comments:  01/18/13 Oletta Cohn, RN, BSN, NCM [phone] Spoke to pt regarding discharge planning.  Offered pt [employer] [facility] [employer].  Pt chose [facility] to render services.  Kizzie Furnish of [facility] notified.  Pt also asked about medication assistance. Notified pt of [facility] and [facility] $4 medication list.  Pt chose to stay with [facility] because of delivery services.

## 2013-01-18 NOTE — Evaluation (Signed)
Physical Therapy Evaluation Patient Details Name: Karina Weber MRN: [id number] DOB: 1956-08-13 Today's Date: 01/18/2013 Time: 4098-1191 PT Time Calculation (min): 19 min  PT Assessment / Plan / Recommendation Clinical Impression  Pt is 57 yo female admitted for SOB. Pt on 2Lo2 via [place] at home and has h/o NSCLC with possible R UL cancer relapse in addition to COPD. Pt functioning at baseline and is safe to d/c home from PT standpoint once medically stable. Pt  with good home set up and support however is functioning mod I at this time. Pt is requesting use of small O2 tank to put on her back from community as she only has the large oxygen tank.    PT Assessment  Patient needs continued PT services    Follow Up Recommendations  No PT follow up;Supervision - Intermittent    Does the patient have the potential to tolerate intense rehabilitation      Barriers to Discharge None      Equipment Recommendations  None recommended by PT - pt requesting small O2 tank for community    Recommendations for Other Services     Frequency Min 2X/week    Precautions / Restrictions Precautions Precautions: None Restrictions Weight Bearing Restrictions: No   Pertinent Vitals/Pain 0/10 pain      Mobility  Bed Mobility Bed Mobility: Supine to Sit;Sit to Supine Supine to Sit: 6: Modified independent (Device/Increase time);HOB flat Sit to Supine: 6: Modified independent (Device/Increase time);HOB flat Details for Bed Mobility Assistance: safe technique Transfers Transfers: Sit to Stand;Stand to Sit Sit to Stand: 6: Modified independent (Device/Increase time);With upper extremity assist;From bed Stand to Sit: 6: Modified independent (Device/Increase time);With upper extremity assist;To bed Details for Transfer Assistance: safe technique Ambulation/Gait Ambulation/Gait Assistance: 5: Supervision Ambulation Distance (Feet): 150 Feet Assistive device: None Ambulation/Gait Assistance Details:  pt with + SOB, on 2LO2 pt SpO2 >89%. decreased pace due to SOB. pt c/o "my legs cramp and get tight" Gait Pattern: Step-through pattern Gait velocity: decreased pace General Gait Details: no episodes of LOB Stairs: Yes Stairs Assistance: 4: Min guard Stair Management Technique: One rail Right Number of Stairs: 3 Wheelchair Mobility Wheelchair Mobility: No    Exercises     PT Diagnosis: Generalized weakness  PT Problem List: Decreased activity tolerance PT Treatment Interventions: Functional mobility training   PT Goals Acute Rehab PT Goals PT Goal Formulation: With patient Time For Goal Achievement: 02/01/13 Potential to Achieve Goals: Good Pt will Ambulate: >150 feet;with modified independence;with least restrictive assistive device PT Goal: Ambulate - Progress: Goal set today Pt will Go Up / Down Stairs: 3-5 stairs;with modified independence;with rail(s) PT Goal: Up/Down Stairs - Progress: Goal set today Pt will Perform Home Exercise Program: Independently PT Goal: Perform Home Exercise Program - Progress: Goal set today  Visit Information  Last PT Received On: 01/18/13 Assistance Needed: +1    Subjective Data  Subjective: Pt received supine in bed.   Prior Functioning  Home Living Lives With:  (roomate (mutual grandmother to grandchildren)) Available Help at Discharge: Family;Friend(s);Available 24 hours/day Type of Home: House Home Access: Stairs to enter [employer] of Steps: 2-3 Entrance Stairs-Rails: Can reach both Home Layout: One level Bathroom Shower/Tub: Engineer, manufacturing systems: Standard Bathroom Accessibility: Yes How Accessible: Accessible via walker Home Adaptive Equipment: Straight cane Prior Function Level of Independence: Independent Able to Take Stairs?: Yes Driving: No Vocation: Retired Comments: on 2 LO2 via [place] at home Communication Communication: No difficulties Dominant Hand: Right  Cognition   Cognition Arousal/Alertness: Awake/alert Behavior During Therapy: WFL for tasks assessed/performed Overall Cognitive Status: Within Functional Limits for tasks assessed    Extremity/Trunk Assessment Right Upper Extremity Assessment RUE ROM/Strength/Tone: Within functional levels RUE Sensation: WFL - Light Touch Left Upper Extremity Assessment LUE ROM/Strength/Tone: Within functional levels LUE Sensation: WFL - Light Touch Right Lower Extremity Assessment RLE ROM/Strength/Tone: Within functional levels RLE Sensation: WFL - Light Touch Left Lower Extremity Assessment LLE ROM/Strength/Tone: Within functional levels LLE Sensation: WFL - Light Touch Trunk Assessment Trunk Assessment: Normal   Balance    End of Session PT - End of Session Equipment Utilized During Treatment: Gait belt;Oxygen (2Lo2 via [place]) Activity Tolerance: Patient tolerated treatment well Patient left: in bed;with call bell/phone within reach Nurse Communication: Mobility status  GP     Marcene Brawn 01/18/2013, 9:26 AM  Lewis Shock, PT, DPT Pager #: [phone] Office #: [phone]
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
## 2013-01-18 NOTE — Progress Notes (Addendum)
Subjective: Says she wants to go home. Saturating well on home O2 flow rate. Still with some cough.  K 5.7 on am Bmet but not accurate as hemolysis noted.  Denies N/V, dizziness, diarrhea.  Objective: Vital signs in last 24 hours: Filed Vitals:   01/17/13 2039 01/17/13 2047 01/18/13 0540 01/18/13 0915  BP:  168/80 170/90   Pulse:  106 95   Temp:  98.2 F (36.8 C) 97.4 F (36.3 C)   TempSrc:  Oral Oral   Resp:  18 18   Height:      Weight:   96 lb 6.4 oz (43.727 kg)   SpO2: 93% 93% 94% 93%   Weight change: 14.4 oz (0.408 kg)  Intake/Output Summary (Last 24 hours) at 01/18/13 1016 Last data filed at 01/18/13 0845  Gross per 24 hour  Intake    700 ml  Output   1100 ml  Net   -400 ml   Vitals reviewed. General: Thin female lying in bed, NAD HEENT: PERRL, EOMI, no scleral icterus Cardiac: Tachycardic to 100 with regular rhythm, no rubs, murmurs or gallops Pulm: Normal WOB. Still with some decreased air entry bilateral lung fields, no wheezing. Abd: soft, nondistended, mild TTP. No rebound or guarding. 2cmX2cm moblie nodule on R abdomen superolaterally to navel Ext: warm and well perfused, no pedal edema Neuro: alert and oriented X3, cranial nerves II-XII grossly intact, strength and sensation to light touch equal in bilateral upper and lower extremities  Lab Results: Basic Metabolic Panel:  Recent Labs Lab 01/17/13 0130 01/18/13 0520  NA 137 139  K 4.7 5.7*  CL 96 96  CO2 29 38*  GLUCOSE 200* 101*  BUN 11 23  CREATININE 0.46* 0.50  CALCIUM 9.7 10.1  MG 1.5  --   PHOS 4.0  --    Liver Function Tests:  Recent Labs Lab 01/16/13 1857  AST 17  ALT 9  ALKPHOS 131*  BILITOT 0.2*  PROT 7.3  ALBUMIN 3.2*   CBC:  Recent Labs Lab 01/16/13 1857 01/18/13 0520  WBC 7.5 8.0  NEUTROABS 5.0 5.6  HGB 12.4 13.8  HCT 38.3 41.7  MCV 92.1 91.6  PLT 199 269   Cardiac Enzymes:  Recent Labs Lab 01/17/13 0142  TROPONINI <0.30   Coagulation:  Recent Labs Lab  01/17/13 0130  LABPROT 12.3  INR 0.92   Urine Drug Screen: Drugs of Abuse     Component Value Date/Time   LABOPIA NONE DETECTED 01/17/2013 0622   LABOPIA NEGATIVE 07/09/2012 1535   COCAINSCRNUR NONE DETECTED 01/17/2013 0622   COCAINSCRNUR POSITIVE* 07/09/2012 1535   LABBENZ NONE DETECTED 01/17/2013 0622   LABBENZ NEGATIVE 07/09/2012 1535   AMPHETMU NONE DETECTED 01/17/2013 0622   AMPHETMU NEGATIVE 07/09/2012 1535   THCU NONE DETECTED 01/17/2013 0622   LABBARB NONE DETECTED 01/17/2013 0622    Micro Results: Recent Results (from the past 240 hour(s))  CULTURE, BLOOD (ROUTINE X 2)     Status: None   Collection Time    01/17/13  1:30 AM      Result Value Range Status   Specimen Description BLOOD RIGHT ARM   Final   Special Requests BOTTLES DRAWN AEROBIC AND ANAEROBIC 10CC   Final   Culture  Setup Time 01/17/2013 08:30   Final   Culture     Final   Value:        BLOOD CULTURE RECEIVED NO GROWTH TO DATE CULTURE WILL BE HELD FOR 5 DAYS BEFORE ISSUING A FINAL NEGATIVE  REPORT   Report Status PENDING   Incomplete  CULTURE, BLOOD (ROUTINE X 2)     Status: None   Collection Time    01/17/13  1:35 AM      Result Value Range Status   Specimen Description BLOOD LEFT ARM   Final   Special Requests BOTTLES DRAWN AEROBIC AND ANAEROBIC 10CC   Final   Culture  Setup Time 01/17/2013 08:30   Final   Culture     Final   Value:        BLOOD CULTURE RECEIVED NO GROWTH TO DATE CULTURE WILL BE HELD FOR 5 DAYS BEFORE ISSUING A FINAL NEGATIVE REPORT   Report Status PENDING   Incomplete  CULTURE, EXPECTORATED SPUTUM-ASSESSMENT     Status: None   Collection Time    01/17/13  6:33 AM      Result Value Range Status   Specimen Description SPU   Final   Special Requests Normal   Final   Sputum evaluation     Final   Value: MICROSCOPIC FINDINGS SUGGEST THAT THIS SPECIMEN IS NOT REPRESENTATIVE OF LOWER RESPIRATORY SECRETIONS. PLEASE RECOLLECT.     Gram Stain Report Called to,Read Back By and Verified With: Haney  RN 8:20 01/17/13 ([email or web address])   Report Status 01/17/2013 FINAL   Final   Studies/Results: Ct Angio Chest Pe W/cm &/or Wo Cm  01/16/2013  *RADIOLOGY REPORT*  Clinical Data: Hemoptysis.  Cough and shortness of breath.  CT ANGIOGRAPHY CHEST  Technique:  Multidetector CT imaging of the chest using the standard protocol during bolus administration of intravenous contrast. Multiplanar reconstructed images including MIPs were obtained and reviewed to evaluate the vascular anatomy.  Contrast: OMNIPAQUE IOHEXOL 350 MG/ML SOLN  Comparison: Chest x-ray dated 01/16/2013 and chest CT dated 04/17/2012  Findings: There is increased density in the right upper lobe posteriorly at the site of the patients of previous tumor.  This is consistent with recurrent tumor.  There is a small focal area of consolidation in the medial segment right middle lobe which is new.  There are no pulmonary emboli.  Azygos lymph node has enlarged from 14.9 to 19.0 mm.  There are minimal linear atelectasis in the left upper lobe in the lingula.  Chronic scarring at the left lung base inferior laterally.  The there is a slight compression fracture of the superior plate of T5, new since 04/17/2012.  I suspect this is an osteoporotic fracture.  No bone destruction.  IMPRESSION:  1.  Increasing density posterior medially in the left upper lobe consistent with recurrent tumor. 2. Focal area of consolidation in the medial aspect of the right middle lobe.  I suspect this is inflammatory. 3.  Enlarging azygos lymph node consistent with metastatic disease. 4. Diffuse emphysema.   Original Report Authenticated By: Francene Boyers, M.D.    Dg Chest Portable 1 View  01/16/2013  *RADIOLOGY REPORT*  Clinical Data: Shortness of breath.  PORTABLE CHEST - 1 VIEW  Comparison: 09/21/2012  Findings: Heart size and vascularity are normal considering the AP portable technique.  Minimal atelectasis or scarring at the left base laterally.  No infiltrates or effusions.   No acute osseous abnormality.  IMPRESSION: Minimal atelectasis or scarring at the left lung base.   Original Report Authenticated By: Francene Boyers, M.D.    Medications: I have reviewed the patient's current medications. Scheduled Meds: . amLODipine  5 mg Oral Daily  . aspirin EC  81 mg Oral Daily  . azithromycin  250 mg Oral  Daily  . feeding supplement  237 mL Oral BID BM  . ferrous sulfate  325 mg Oral BID  . guaiFENesin  600 mg Oral BID  . heparin  5,000 Units Subcutaneous Q8H  . ipratropium  0.5 mg Nebulization Q6H  . levalbuterol  1.25 mg Nebulization Q6H  . mirtazapine  15 mg Oral QHS  . predniSONE  60 mg Oral Q breakfast  . sodium chloride  3 mL Intravenous Q12H  . sodium chloride  3 mL Intravenous Q12H   Continuous Infusions:  PRN Meds:.sodium chloride, acetaminophen, acetaminophen, guaiFENesin-dextromethorphan, ketorolac, sodium chloride  Assessment/Plan:  1. Acute on chronic COPD exacerbation:  Patient initially presented with acute hypercapnic (PaCO2 63) and hypoxic respiratory failure.  Oxygenation improved with administration of NRB and then nasal cannula now titrated down to home O2 flow rate. Symptoms have improved with steroids and scheduled nebulizers. ? Precipitated by NSCLC recurrence of RUL. - Continue prednisone 60mg  x5 more days on discharge - Continue scheduled nebs - D/c abx  2. 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 Patient was initially followed up with pulmonologist but lost to F/U. She was last seen in pulmonologist office by NP Tammy Parrett 07/20/12. It seems that she has recurrent lung cancer now as evidenced by CT angiogram today. She also has mild hemoptysis. She has azygos lymph node which has enlarged from 14.9 to 19.0 mm. Consulted pulm and onc  - Outpatient onc + rad-onc f/u   3. HTN  Blood pressure is 162/99 on admission. She  was on bisoprolol at home, but was not compliant to her medications. BP meds on admission changed from bisoprolol to amlodipine 5mg  daily. Has not received BP meds yet this am. AM BP170/90. - Increase amlodipine to 10 mg  4. Polysubstance abuse:  Patient has been smoking for about 40 years. She is still smoking 1 pack per day recently. She also drinks alcohol, last drink was today (1 12 oz. Beer but drinks 1-4 beers at a time). She has a history of cocaine abuse. Her UDS was positive for cocaine on 07/09/12.  - Consult SW for subs abuse cessation    5. Headache:  She has chronic migraine headache per patient. She is on sumatriptan at home.  - Continue home triptan  6. Protein calorie malnutrition  Albumin 3.2  - nutrition following  DVT PPX:  Heparin sq   Dispo: Anticipate home today w [facility] RN   The patient does have a current PCP (PRIBULA,CHRISTOPHER, MD), therefore will be requiring OPC follow-up after discharge.   .Services Needed at time of discharge: Y = Yes, Blank = No PT:   OT:   RN:   Equipment:   Other:     LOS: 2 days   Bronson Curb 01/18/2013, 10:16 AM

## 2013-01-21 NOTE — Discharge Summary (Signed)
INTERNAL MEDICINE TEACHING SERVICE Attending Note  Date: 01/21/2013  Patient name: Karina Weber  Medical record number: [id number]  Date of birth: May 14, 1956    This patient has been discussed with the house staff. Please see their note for complete details. I concur with their findings and plan.  The treatment plan was discussed in detail with the patient.  Alternatives to treatment, side effects, risks and benefits, and complications were discussed with the patient. Informed consent was obtained. The patient agrees to proceed with the current treatment plan.  Jonah Blue, DO  01/21/2013, 9:12 AM

## 2013-01-23 LAB — CULTURE, BLOOD (ROUTINE X 2): Culture: NO GROWTH

## 2013-01-24 ENCOUNTER — Telehealth: Payer: Self-pay | Admitting: *Deleted

## 2013-01-24 DIAGNOSIS — I10 Essential (primary) hypertension: Secondary | ICD-10-CM

## 2013-01-24 MED ORDER — METOPROLOL SUCCINATE ER 50 MG PO TB24: 50.0000 mg | ORAL_TABLET | Freq: Every day | ORAL | Status: DC

## 2013-01-24 NOTE — Telephone Encounter (Signed)
I spoke with Dr. Heloise Beecham who took care of her in the hospital and she states that she was on that medication because of her need for a beta blocker in the setting of severe COPD.  We will switch it to metoprolol to try to stay with a beta 1 selective beta blocker.

## 2013-01-24 NOTE — Telephone Encounter (Signed)
Call from [place], RN with [facility] - # [phone] Nurse is in pt's home and reports pharmacy is having a problem with filling bisoprolol 10 mg.  Medicare will not cover this med. Can you change to another brand?

## 2013-01-24 NOTE — Telephone Encounter (Signed)
French Ana informed Rx changed

## 2013-01-30 ENCOUNTER — Ambulatory Visit: Payer: Medicare Other | Admitting: Internal Medicine

## 2013-01-31 ENCOUNTER — Ambulatory Visit: Payer: Medicare Other | Attending: Radiation Oncology | Admitting: Radiation Oncology

## 2013-02-01 ENCOUNTER — Encounter: Payer: Self-pay | Admitting: Internal Medicine

## 2013-02-14 ENCOUNTER — Ambulatory Visit: Payer: Medicare Other | Admitting: Radiation Oncology

## 2013-02-21 ENCOUNTER — Telehealth: Payer: Self-pay | Admitting: *Deleted

## 2013-02-21 ENCOUNTER — Ambulatory Visit
Admission: RE | Admit: 2013-02-21 | Discharge: 2013-02-21 | Disposition: A | Discharge status: Home / Self Care | Payer: Medicare Other | Source: Ambulatory Visit | Attending: Radiation Oncology | Admitting: Radiation Oncology

## 2013-02-21 DIAGNOSIS — C3410 Malignant neoplasm of upper lobe, unspecified bronchus or lung: Secondary | ICD-10-CM | POA: Insufficient documentation

## 2013-02-21 NOTE — Progress Notes (Signed)
Radiation Oncology         [phone] ________________________________  Name: Karina Weber MRN: [id number]  Date: 02/21/2013  DOB: August 14, 1956  Follow-Up Visit Note  CC: Leodis Sias, MD  Leodis Sias, MD   Eli Hose, MD  Diagnosis:   Lung cancer  Interval Since Last Radiation:  4 years   Narrative:  The patient returns today for follow-up.  The patient completed a course of stereotactic body radiotherapy in 2011. She had been lost to followup and has had some issues with noncompliance. When she was last seen I ordered restaging scans with the plan to follow her soon after that last visit. However this was not completed.  The patient however has been hospitalized with ongoing respiratory difficulties. She has a history of significant COPD and this has been an ongoing issue for her. She does complain of some shortness of breath. She continues to use oxygen at home. She also complains of some generalized weakness but no focal weakness. Another complaint is ongoing headaches which she states that she has had difficulties with for at least 2 years. No major change in this.                              ALLERGIES:  is allergic to lisinopril.  Meds: Current Outpatient Prescriptions  Medication Sig Dispense Refill  . albuterol (VENTOLIN HFA) 108 (90 BASE) MCG/ACT inhaler Inhale 2 puffs into the lungs every 4 (four) hours as needed for wheezing or shortness of breath.  18 g  6  . aspirin EC 81 MG tablet Take 81 mg by mouth daily.      . ferrous sulfate 325 (65 FE) MG tablet Take 1 tablet (325 mg total) by mouth 2 (two) times daily.  60 tablet  5  . Fluticasone-Salmeterol (ADVAIR) 500-50 MCG/DOSE AEPB Inhale 1 puff into the lungs every 12 (twelve) hours.  60 each  3  . ipratropium (ATROVENT) 0.02 % nebulizer solution Take 2.5 mLs (0.5 mg total) by nebulization every 6 (six) hours.  75 mL  3  . metoprolol succinate (TOPROL-XL) 50 MG 24 hr tablet Take 1 tablet (50 mg total) by  mouth daily. Take with or immediately following a meal.  30 tablet  3  . feeding supplement (ENSURE COMPLETE) LIQD Take 237 mLs by mouth 2 (two) times daily between meals.  60 Bottle  3  . ketorolac (TORADOL) 10 MG tablet Take 1 tablet (10 mg total) by mouth every 6 (six) hours as needed for pain.  20 tablet  0  . levalbuterol (XOPENEX) 1.25 MG/0.5ML nebulizer solution Take 1.25 mg by nebulization every 6 (six) hours.  1 each  3  . mirtazapine (REMERON) 15 MG tablet Take 1 tablet (15 mg total) by mouth at bedtime.  30 tablet  6  . predniSONE (DELTASONE) 20 MG tablet Take 3 tablets by mouth daily for 6 more days.  18 tablet  0  . SUMAtriptan (IMITREX) 50 MG tablet Take 1 tablet (50 mg total) by mouth once as needed for migraine.  30 tablet  2  . [DISCONTINUED] ferrous gluconate (FERGON) 325 MG tablet Take 325 mg by mouth 3 (three) times daily with meals.        . [DISCONTINUED] losartan (COZAAR) 100 MG tablet Take 1 tablet (100 mg total) by mouth daily.  31 tablet  11   No current facility-administered medications for this encounter.    Physical Findings: The patient  is in no acute distress. Patient is alert and oriented.  height is 5' (1.524 m) and weight is 98 lb (44.453 kg). Her temperature is 98.6 F (37 C). Her blood pressure is 169/77 and her pulse is 75. Her oxygen saturation is 92%. .   General: Well-developed, in no acute distress HEENT: Normocephalic, atraumatic Cardiovascular: Regular rate and rhythm Respiratory: Clear to auscultation bilaterally GI: Soft, nontender, normal bowel sounds Extremities: No edema present   Lab Findings: Lab Results  Component Value Date   WBC 8.0 01/18/2013   HGB 13.8 01/18/2013   HCT 41.7 01/18/2013   MCV 91.6 01/18/2013   PLT 269 01/18/2013     Radiographic Findings: No results found.  Impression:    The patient today has some ongoing chronic issues. No role for radiotherapy currently. She did have some imaging in the hospital which showed  some possible changes which need to be followed. She was going to have further workup including a PET scan as an outpatient in addition to followup with medical oncology. This did not happen, again with the patient continuing to struggle with compliance. The last scans that I ordered also did not occur. She does request that I ask medical oncology to reschedule her appointment and I will put in this request. I'm going to go ahead and ask for a repeat CT scan of the chest in 3 months in followup. I will defer to medical oncology regarding possible additional workup which they may want if the patient is able to reconnect with them.  Plan:  CT scan in 3 months, then followup appointment.  I spent 10 minutes with the patient today, the majority of which was spent counseling the patient on the diagnosis of cancer and coordinating care.   Radene Gunning, M.D., Ph.D.

## 2013-02-21 NOTE — Telephone Encounter (Signed)
CALLED PATIENT TO INFORM OF TEST AND FU VISIT, LVM FOR A RETURN CALL 

## 2013-02-21 NOTE — Progress Notes (Addendum)
[name] here for follow up after receiving 50 gray to her right upper lobe. She is alert and oriented to person, place and time.  She is able to ambulate by herself.  She states she feels tired and weak.  She does feel short of breath.  She states she does get short of breath right away with activity and gets chest pain.  Her oxygen saturation on room air was 85%.  She was placed on 2l of oxygen and her oxygen saturation went up to 92% and then 98%.  She does have oxygen at home but the tank is too big for her to carry around.  Advised patient to call [facility] to have them bring her a travel oxygen tank.  She also is reporting occasional tingling in her hands and feet.  She does have a cough and brings up thick, brown sputum.  She states she occasionaly sees blood.  She also has a poor appetite and vomits after eating.  She is unable to afford ensure.  [facility] and they do not have Karina Weber as a Financial trader.  Karina Weber stated she has a number at home that she calls for her oxygen.  She will call when she gets home.  Escorted her upstairs to wait for her Zenaida Niece.

## 2013-02-22 ENCOUNTER — Telehealth: Payer: Self-pay | Admitting: Nutrition

## 2013-02-22 ENCOUNTER — Telehealth: Payer: Self-pay | Admitting: Oncology

## 2013-02-22 NOTE — Telephone Encounter (Signed)
LVOM FOR PT TO RETURN CALL IN RE TO HOSP F/U APPT.  °

## 2013-02-22 NOTE — Telephone Encounter (Signed)
I received a message from nursing the patient could not afford ensure. I called patient and left a message on her phone that I would be happy to talk with her in detail about how I could help her with coupons and samples. I've given my phone number for her to followup with me. Patient's chart was reviewed.

## 2013-03-04 ENCOUNTER — Telehealth: Payer: Self-pay | Admitting: Dietician

## 2013-03-04 NOTE — Telephone Encounter (Signed)
Brief Outpatient Oncology Nutrition Note  Patient has been identified to be at risk on malnutrition screen.  Wt Readings from Last 10 Encounters:  02/21/13 98 lb (44.453 kg)  01/18/13 96 lb 6.4 oz (43.727 kg)  09/24/12 100 lb 15.5 oz (45.8 kg)  07/23/12 106 lb 12.8 oz (48.444 kg)  07/20/12 104 lb 6.4 oz (47.356 kg)  07/12/12 101 lb 6.4 oz (45.995 kg)  06/04/12 168 lb 3.4 oz (76.3 kg)  04/23/12 99 lb 11.2 oz (45.224 kg)  04/17/12 104 lb 8 oz (47.4 kg)  12/28/11 94 lb 2.2 oz (42.7 kg)    Called and left a [facility] contact info.  Per chart patient is unable to afford Ensure.  Patient identified on list due to weight loss.  Oran Rein, RD, LDN

## 2013-03-08 ENCOUNTER — Telehealth: Payer: Self-pay | Admitting: *Deleted

## 2013-03-08 NOTE — Telephone Encounter (Signed)
Call from Tracy,RN at [facility] - requesting an order for 1 more visit to discharge pt; states she has completed all teaching needed.  Verbal order given. If not appropriate, let me know.

## 2013-03-08 NOTE — Telephone Encounter (Signed)
Okay to proceed with one more visit.  Thanks

## 2013-03-18 ENCOUNTER — Telehealth: Payer: Self-pay | Admitting: *Deleted

## 2013-03-18 NOTE — Telephone Encounter (Signed)
[facility] calls and states pt has recently been having 02 sats on room air of 76% to 78% then when 02 is used 02 sat goes to 91 to 92%. She has been having trouble with her 02 equipment and [facility] needs an order to evaluate i called and ask dr moody's office to address this, he saw her 5/22 and has all information in the note from that visit Also i will call and ask her to schedule an appt w/ new pcp asap since she has not been seen in clinic since october

## 2013-03-19 NOTE — Telephone Encounter (Addendum)
Sounds good.  She hasn't been seen recently in our office.

## 2013-03-26 ENCOUNTER — Other Ambulatory Visit: Payer: Self-pay | Admitting: Radiation Oncology

## 2013-03-26 DIAGNOSIS — C801 Malignant (primary) neoplasm, unspecified: Secondary | ICD-10-CM

## 2013-05-07 ENCOUNTER — Other Ambulatory Visit: Payer: Self-pay | Admitting: *Deleted

## 2013-05-07 ENCOUNTER — Telehealth: Payer: Self-pay | Admitting: *Deleted

## 2013-05-07 ENCOUNTER — Telehealth: Payer: Self-pay | Admitting: Oncology

## 2013-05-07 DIAGNOSIS — I10 Essential (primary) hypertension: Secondary | ICD-10-CM

## 2013-05-07 MED ORDER — METOPROLOL SUCCINATE ER 50 MG PO TB24: 50.0000 mg | ORAL_TABLET | Freq: Every day | ORAL | Status: DC

## 2013-05-07 NOTE — Telephone Encounter (Signed)
Patient has not been seen recently in [facility], after review of chart I will give 1 month refill and patient will need to be seen in clinic for more refills.

## 2013-05-07 NOTE — Telephone Encounter (Addendum)
Received a call from Leanne Lovely stating that she needs someone to come and replace her oxygen.  Called [facility] to see if they received the referral that Dr. Mitzi Hansen had done in June.  Spoke to [place] and he said that they had not received the referral.  It was refaxed to [facility] at [phone].  Called them back to check on status of referral and they will call back to let us know if they received it.  Late entry: received a call back from Clydie Braun at [facility].  She did receive the referral but needs to have her requalified for oxygen.  To do this she needs an office note from the last month stating the medical necessity for why the patient needs oxygen (i.e. COPD, nebulizer, inhalers).  Also, she needs a oxygen saturation at rest on room air.  If she has an oxygen saturation less than 88%, she automatically qualifies.  If not, she needs to be monitored on room air with exertion.  Again, if she is 88% or below, she qualifies.  Finally, with ambulation and on oxygen, if her o2 sat has improvement, she will qualify  Will check with Dr. Mitzi Hansen to see how he wants to proceed.

## 2013-05-07 NOTE — Telephone Encounter (Signed)
Called Karina Weber to let her know that I am working on getting her home oxygen.  Let her know that I will talk to Dr. Mitzi Hansen tomorrow and try to get her in to be seen.  [name] stated that she will not be able to come in tomorrow because she is watching her grandson.  She would be able to come in on Thursday.  Told her I would call her back tomorrow.

## 2013-05-07 NOTE — Telephone Encounter (Signed)
REFAXED REFERRAL TO [facility] ON THIS PT., CONFIRMATION RECEIVED WHERE ORDER WENT THROUGH

## 2013-05-08 ENCOUNTER — Telehealth: Payer: Self-pay | Admitting: Oncology

## 2013-05-08 NOTE — Telephone Encounter (Signed)
Karina Weber called back and is ok with coming in at 12:00 tomorrow.

## 2013-05-08 NOTE — Telephone Encounter (Signed)
Tried to reach Karina Weber at 9:30.  No answer at her number.  Called back at 1:10 and left a message for her to call back at [phone].

## 2013-05-09 ENCOUNTER — Ambulatory Visit
Admission: RE | Admit: 2013-05-09 | Discharge: 2013-05-09 | Disposition: A | Discharge status: Home / Self Care | Payer: Medicare Other | Source: Ambulatory Visit | Attending: Radiation Oncology | Admitting: Radiation Oncology

## 2013-05-09 NOTE — Progress Notes (Addendum)
Leanne Lovely here for follow up after treatment for lung cancer.  She is here also to have her home oxygen set up.  Her oxygen saturation on arrival to the clinic was 74% on room air at rest and then went to 87% on room air after deep breathing.  After she was placed on 2l of oxgyen by nasal canula, she went up to 95%.  She has a cough and is bringing up white to dark brown sputum.  She states she has generalized pain especially in her midchest, lower back and abdomen that she is rating at a 10/10.  She states that she has not been able to eat or do any activity since she does not have oxygen at home.  She also needs a refill on her albuterol, advair, atrovent and xopenex.  She has 6 tablets left of her metoprolol. She states that Dr. Manson Passey has been filling it.  She is currently smoking 2 cigarettes per day.  Late entry:  Called Lucretia with [facility] to get [place] and oxygen tank to go home with.  Darral Dash is working on the paperwork and will get Qamar a tank before she goes home.  Arlanda has received her oxygen tank and her daughter in law was called to pick her up.  She ambulated to the lobby to wait for her ride.

## 2013-05-09 NOTE — Progress Notes (Signed)
[facility]  Patient Status: Outpatient  [facility] is providing the following services: Oxygen  If patient discharges after hours, please call [phone].    **Patient will have to sign an Advanced Beneficiary of Noncoverage (ABN) due to no recent OV notes stating the need for her home O2.  Also, the sats that were documented does not specifiy "room air" which is required by Medicare.  This is a Medicare requirement, not [facility].  Patients oxygen will not be paid for by Medicare until correct documentation has been received.  Portable tank will [facility], but it is up to the patient whether or not she would like to proceed with the home set up and pay out of pocket.    Renard Hamper [phone] 05/09/2013, 12:54 PM

## 2013-05-09 NOTE — Progress Notes (Signed)
Radiation Oncology         [phone] ________________________________  Name: Karina Weber MRN: [id number]  Date: 05/09/2013  DOB: Aug 21, 1956  Follow-Up Visit Note  CC: Gust Rung, DO  Carlynn Purl, DO  Diagnosis:   Non-small cell lung cancer  Interval Since Last Radiation:  The patient received a course of stereotactic body radiotherapy in 2011   Narrative:  The patient returns today for routine follow-up.  The patient states that she is doing reasonably well at this time. Her primary complaint today is that of some shortness of breath. She states that she needs a new oxygen obtained as her current one is not working well. She arrived in the clinic today with an oxygen saturation level of 74%. She was placed on 2 L of oxygen by nasal cannula and this increased to 95%. The patient is scheduled to undergo imaging in several weeks with followup appointment subsequently. She presented today do to her breathing difficulties and to discuss this.                              ALLERGIES:  is allergic to lisinopril.  Meds: Current Outpatient Prescriptions  Medication Sig Dispense Refill  . albuterol (VENTOLIN HFA) 108 (90 BASE) MCG/ACT inhaler Inhale 2 puffs into the lungs every 4 (four) hours as needed for wheezing or shortness of breath.  18 g  6  . aspirin EC 81 MG tablet Take 81 mg by mouth daily.      . ferrous sulfate 325 (65 FE) MG tablet Take 1 tablet (325 mg total) by mouth 2 (two) times daily.  60 tablet  5  . Fluticasone-Salmeterol (ADVAIR) 500-50 MCG/DOSE AEPB Inhale 1 puff into the lungs every 12 (twelve) hours.  60 each  3  . ipratropium (ATROVENT) 0.02 % nebulizer solution Take 2.5 mLs (0.5 mg total) by nebulization every 6 (six) hours.  75 mL  3  . metoprolol succinate (TOPROL-XL) 50 MG 24 hr tablet Take 1 tablet (50 mg total) by mouth daily. Take with or immediately following a meal.  30 tablet  0  . feeding supplement (ENSURE COMPLETE) LIQD Take 237 mLs by mouth 2 (two)  times daily between meals.  60 Bottle  3  . ketorolac (TORADOL) 10 MG tablet Take 1 tablet (10 mg total) by mouth every 6 (six) hours as needed for pain.  20 tablet  0  . levalbuterol (XOPENEX) 1.25 MG/0.5ML nebulizer solution Take 1.25 mg by nebulization every 6 (six) hours.  1 each  3  . mirtazapine (REMERON) 15 MG tablet Take 1 tablet (15 mg total) by mouth at bedtime.  30 tablet  6  . predniSONE (DELTASONE) 20 MG tablet Take 3 tablets by mouth daily for 6 more days.  18 tablet  0  . SUMAtriptan (IMITREX) 50 MG tablet Take 1 tablet (50 mg total) by mouth once as needed for migraine.  30 tablet  2  . [DISCONTINUED] ferrous gluconate (FERGON) 325 MG tablet Take 325 mg by mouth 3 (three) times daily with meals.        . [DISCONTINUED] losartan (COZAAR) 100 MG tablet Take 1 tablet (100 mg total) by mouth daily.  31 tablet  11   No current facility-administered medications for this encounter.    Physical Findings: The patient is in no acute distress. Patient is alert and oriented.  height is 5' (1.524 m) and weight is 103 lb 4.8  oz (46.857 kg). Her temperature is 98.7 F (37.1 C). Her blood pressure is 146/91 and her pulse is 90. Her oxygen saturation is 95%. .   General: Well-developed, in no acute distress HEENT: Normocephalic, atraumatic Cardiovascular: Regular rate and rhythm Respiratory: Clear to auscultation bilaterally GI: Soft, nontender, normal bowel sounds Extremities: No edema present   Lab Findings: Lab Results  Component Value Date   WBC 8.0 01/18/2013   HGB 13.8 01/18/2013   HCT 41.7 01/18/2013   MCV 91.6 01/18/2013   PLT 269 01/18/2013     Radiographic Findings: No results found.  Impression:    The patient is a pleasant 57 year old female with a history lung cancer status post stereotactic body radiotherapy. It was apparent today that she does need supplemental oxygen at home. We will work with her insurance company/home health providers to facilitate this as clinically  in my opinion this is necessary.  Plan:  She will followup in our clinic in several weeks after undergoing repeat imaging.  I spent 10 minutes with the patient today, the majority of which was spent counseling the patient on the diagnosis of cancer and coordinating care.   Radene Gunning, M.D., Ph.D.

## 2013-05-14 ENCOUNTER — Encounter: Payer: Self-pay | Admitting: Internal Medicine

## 2013-05-24 ENCOUNTER — Ambulatory Visit (HOSPITAL_COMMUNITY)
Admission: RE | Admit: 2013-05-24 | Discharge: 2013-05-24 | Disposition: A | Discharge status: Home / Self Care | Payer: Medicare Other | Source: Ambulatory Visit | Attending: Radiation Oncology | Admitting: Radiation Oncology

## 2013-05-24 DIAGNOSIS — R599 Enlarged lymph nodes, unspecified: Secondary | ICD-10-CM | POA: Insufficient documentation

## 2013-05-24 DIAGNOSIS — C3490 Malignant neoplasm of unspecified part of unspecified bronchus or lung: Secondary | ICD-10-CM | POA: Insufficient documentation

## 2013-05-24 DIAGNOSIS — J438 Other emphysema: Secondary | ICD-10-CM | POA: Insufficient documentation

## 2013-05-24 MED ORDER — IOHEXOL 300 MG/ML  SOLN
80.0000 mL | Freq: Once | INTRAMUSCULAR | Status: AC | PRN
  Administered 2013-05-24: 80 mL via INTRAVENOUS

## 2013-05-30 ENCOUNTER — Ambulatory Visit
Admission: RE | Admit: 2013-05-30 | Discharge: 2013-05-30 | Disposition: A | Discharge status: Home / Self Care | Payer: Medicare Other | Source: Ambulatory Visit | Attending: Radiation Oncology | Admitting: Radiation Oncology

## 2013-05-30 NOTE — Progress Notes (Signed)
Karina Weber here for follow up after treatment to her right upper lobe.  Today she is rating her pain at 10/10.  She says it is generalized.  She is very weak and frustrated that she does not have any energy.  She says that she feels like her blood count is off and that something is wrong.  She feels worse when she lays down at night.  Her oxygen saturation on 2l was 82% when she arrived at the clinic.  Her oxygen was increased to 4L and she went up to 92%.  She is very short of breath with activity.  She reports that her legs are swelling.  She also reports that she is only taking her blood pressure medicine and her albuterol inhaler.  She does not have a doctor to fill her medications.  She especially wants a refill on her Toradol.

## 2013-05-31 DIAGNOSIS — R609 Edema, unspecified: Secondary | ICD-10-CM

## 2013-05-31 HISTORY — DX: Edema, unspecified: R60.9

## 2013-05-31 NOTE — Progress Notes (Signed)
Radiation Oncology         [phone] ________________________________  Name: Karina Weber MRN: [id number]  Date: 05/30/2013  DOB: 11/06/1955  Follow-Up Visit Note  CC: Gust Rung, DO  Burns Spain, MD  Diagnosis:   Non-small cell lung cancer status post stereotactic body radiotherapy in 2011  Interval Since Last Radiation:  As above   Narrative:  The patient returns today for routine follow-up.  The patient returns to clinic today for followup after undergoing a CT scan of the chest. She has some generalized pain which she could not localize. She states that his pain is severe and this appears to represent pain which she has had in the past. She is on supplemental oxygen at this time. She notes some shortness of breath with activity. Her respiratory status is managed by her primary care physician, Dr. Manson Passey according to the patient. She is on albuterol inhaler.                              ALLERGIES:  is allergic to lisinopril.  Meds: Current Outpatient Prescriptions  Medication Sig Dispense Refill  . albuterol (VENTOLIN HFA) 108 (90 BASE) MCG/ACT inhaler Inhale 2 puffs into the lungs every 4 (four) hours as needed for wheezing or shortness of breath.  18 g  6  . aspirin EC 81 MG tablet Take 81 mg by mouth daily.      . metoprolol succinate (TOPROL-XL) 50 MG 24 hr tablet Take 1 tablet (50 mg total) by mouth daily. Take with or immediately following a meal.  30 tablet  0  . feeding supplement (ENSURE COMPLETE) LIQD Take 237 mLs by mouth 2 (two) times daily between meals.  60 Bottle  3  . ferrous sulfate 325 (65 FE) MG tablet Take 1 tablet (325 mg total) by mouth 2 (two) times daily.  60 tablet  5  . Fluticasone-Salmeterol (ADVAIR) 500-50 MCG/DOSE AEPB Inhale 1 puff into the lungs every 12 (twelve) hours.  60 each  3  . ipratropium (ATROVENT) 0.02 % nebulizer solution Take 2.5 mLs (0.5 mg total) by nebulization every 6 (six) hours.  75 mL  3  . ketorolac (TORADOL) 10 MG  tablet Take 1 tablet (10 mg total) by mouth every 6 (six) hours as needed for pain.  20 tablet  0  . levalbuterol (XOPENEX) 1.25 MG/0.5ML nebulizer solution Take 1.25 mg by nebulization every 6 (six) hours.  1 each  3  . mirtazapine (REMERON) 15 MG tablet Take 1 tablet (15 mg total) by mouth at bedtime.  30 tablet  6  . predniSONE (DELTASONE) 20 MG tablet Take 3 tablets by mouth daily for 6 more days.  18 tablet  0  . SUMAtriptan (IMITREX) 50 MG tablet Take 1 tablet (50 mg total) by mouth once as needed for migraine.  30 tablet  2  . [DISCONTINUED] ferrous gluconate (FERGON) 325 MG tablet Take 325 mg by mouth 3 (three) times daily with meals.        . [DISCONTINUED] losartan (COZAAR) 100 MG tablet Take 1 tablet (100 mg total) by mouth daily.  31 tablet  11   No current facility-administered medications for this encounter.    Physical Findings: The patient is in no acute distress. Patient is alert and oriented.  height is 5' (1.524 m) and weight is 109 lb 9.6 oz (49.714 kg). Her temperature is 98.1 F (36.7 C). Her blood pressure  is 153/90 and her pulse is 85. Her oxygen saturation is 82%. .   General: Well-developed, in no acute distress HEENT: Normocephalic, atraumatic Cardiovascular: Regular rate and rhythm Respiratory: Clear to auscultation bilaterally GI: Soft, nontender, normal bowel sounds Extremities: No edema present   Lab Findings: Lab Results  Component Value Date   WBC 8.0 01/18/2013   HGB 13.8 01/18/2013   HCT 41.7 01/18/2013   MCV 91.6 01/18/2013   PLT 269 01/18/2013     Radiographic Findings: Ct Chest W Contrast  05/24/2013   *RADIOLOGY REPORT*  Clinical Data: Follow up lung cancer  CT CHEST WITH CONTRAST  Technique:  Multidetector CT imaging of the chest was performed following the standard protocol during bolus administration of intravenous contrast.  Contrast: 80mL OMNIPAQUE IOHEXOL 300 MG/ML  SOLN  Comparison: 01/16/2013  Findings: No pleural effusion identified.   There is mild centrilobular emphysema identified.  The within the posterior medial right upper lobe there is an irregular density measuring 2.9 x 1.4 cm, image 20/series 5.  This is compared with 3.2 x 1.4 cm previously.  The small pulmonary nodules are scattered throughout both lungs.  Index nodule within the anterior right upper lobe is stable measuring 4 mm, image 20/series 5.  Index nodule within the basilar portion of the right upper lobe measures 3 mm and is stable from previous exam. Persistent sub solid density within the superior segment of the right lower lobe is noted, image 27/series 5 and coronal image 64/series 602.  New plate-like atelectasis is identified within the posterior medial left lower lobe.  Moderate cardiac enlargement.  No pericardial effusion.  Enlarged right paratracheal lymph node measures 1.6 cm, image 21/series 2. This is not significantly changed from previous exam.  Enlarged right paratracheal lymph node measures 1 cm, image 15/series 2. This is compared with the same previously.  There is no axillary or supraclavicular adenopathy.  Limited imaging through the upper abdomen shows mild diffuse low attenuation throughout the liver compatible with hepatic steatosis.  Review of the visualized osseous structures is significant for mild spondylosis.  Stable superior endplate deformity involving the T5 vertebra.  IMPRESSION:  1.  Persistent, stable density in the posterior medial right upper lobe which is equivocal for tumor recurrence.  Further evaluation with PET CT is recommended. 2.  Persistent enlarged mediastinal lymph nodes.  Cannot rule out metastatic adenopathy. 3.  Stable sub solid lesion within the superior segment of the right lower lobe.  Attention on follow-up imaging advised.   Original Report Authenticated By: Signa Kell, M.D.    Impression:    The patient CT scan showed a persistent density in the right upper lobe which is equivocal for tumor recurrence. A PET CT  scan was recommended. There are persistent enlarged mediastinal lymph nodes as well as a stable lesion within the right lower lobe.  Plan:  We will proceed with a PET scan which will be completed in approximately one month with subsequent followup. The patient had some different complaints including some leg swelling and she will followup with her primary care physician for this as well as some generalized issues in her management. She requested Toradol refill but this is not something I I am going to give a prescription for on a chronic basis.  I spent 15 minutes with the patient today, the majority of which was spent counseling the patient on the diagnosis of cancer and coordinating care.     Radene Gunning, M.D., Ph.D.

## 2013-06-11 ENCOUNTER — Encounter (HOSPITAL_COMMUNITY): Payer: Self-pay | Admitting: Unknown Physician Specialty

## 2013-06-11 ENCOUNTER — Inpatient Hospital Stay (HOSPITAL_COMMUNITY)
Admission: EM | Admit: 2013-06-11 | Discharge: 2013-06-12 | DRG: 189 | Disposition: A | Discharge status: Home / Self Care | Payer: Medicare Other | Attending: Internal Medicine | Admitting: Internal Medicine

## 2013-06-11 ENCOUNTER — Emergency Department (HOSPITAL_COMMUNITY): Payer: Medicare Other

## 2013-06-11 DIAGNOSIS — F17200 Nicotine dependence, unspecified, uncomplicated: Secondary | ICD-10-CM | POA: Diagnosis present

## 2013-06-11 DIAGNOSIS — J9612 Chronic respiratory failure with hypercapnia: Secondary | ICD-10-CM

## 2013-06-11 DIAGNOSIS — I10 Essential (primary) hypertension: Secondary | ICD-10-CM | POA: Diagnosis present

## 2013-06-11 DIAGNOSIS — I509 Heart failure, unspecified: Secondary | ICD-10-CM

## 2013-06-11 DIAGNOSIS — J441 Chronic obstructive pulmonary disease with (acute) exacerbation: Secondary | ICD-10-CM | POA: Diagnosis present

## 2013-06-11 DIAGNOSIS — C3490 Malignant neoplasm of unspecified part of unspecified bronchus or lung: Secondary | ICD-10-CM | POA: Diagnosis present

## 2013-06-11 DIAGNOSIS — M7120 Synovial cyst of popliteal space [Baker], unspecified knee: Secondary | ICD-10-CM | POA: Diagnosis present

## 2013-06-11 DIAGNOSIS — R0902 Hypoxemia: Secondary | ICD-10-CM

## 2013-06-11 DIAGNOSIS — J449 Chronic obstructive pulmonary disease, unspecified: Secondary | ICD-10-CM

## 2013-06-11 DIAGNOSIS — F1010 Alcohol abuse, uncomplicated: Secondary | ICD-10-CM | POA: Diagnosis present

## 2013-06-11 DIAGNOSIS — J9620 Acute and chronic respiratory failure, unspecified whether with hypoxia or hypercapnia: Principal | ICD-10-CM | POA: Diagnosis present

## 2013-06-11 DIAGNOSIS — Z801 Family history of malignant neoplasm of trachea, bronchus and lung: Secondary | ICD-10-CM

## 2013-06-11 DIAGNOSIS — R0689 Other abnormalities of breathing: Secondary | ICD-10-CM

## 2013-06-11 DIAGNOSIS — J9600 Acute respiratory failure, unspecified whether with hypoxia or hypercapnia: Secondary | ICD-10-CM | POA: Diagnosis present

## 2013-06-11 DIAGNOSIS — Z79899 Other long term (current) drug therapy: Secondary | ICD-10-CM

## 2013-06-11 DIAGNOSIS — J9692 Respiratory failure, unspecified with hypercapnia: Secondary | ICD-10-CM

## 2013-06-11 DIAGNOSIS — Z923 Personal history of irradiation: Secondary | ICD-10-CM

## 2013-06-11 DIAGNOSIS — F1910 Other psychoactive substance abuse, uncomplicated: Secondary | ICD-10-CM | POA: Diagnosis present

## 2013-06-11 DIAGNOSIS — Z7982 Long term (current) use of aspirin: Secondary | ICD-10-CM

## 2013-06-11 DIAGNOSIS — E46 Unspecified protein-calorie malnutrition: Secondary | ICD-10-CM | POA: Diagnosis present

## 2013-06-11 LAB — CBC WITH DIFFERENTIAL/PLATELET
Basophils Absolute: 0 10*3/uL (ref 0.0–0.1)
Basophils Relative: 0 % (ref 0–1)
Eosinophils Relative: 2 % (ref 0–5)
HCT: 42.6 % (ref 36.0–46.0)
MCHC: 30.8 g/dL (ref 30.0–36.0)
Monocytes Absolute: 0.8 10*3/uL (ref 0.1–1.0)
Neutro Abs: 5.5 10*3/uL (ref 1.7–7.7)
Platelets: 148 10*3/uL — ABNORMAL LOW (ref 150–400)
RDW: 16.1 % — ABNORMAL HIGH (ref 11.5–15.5)

## 2013-06-11 LAB — POCT I-STAT 3, ART BLOOD GAS (G3+)
Bicarbonate: 38.9 mEq/L — ABNORMAL HIGH (ref 20.0–24.0)
pCO2 arterial: 73.7 mmHg (ref 35.0–45.0)
pH, Arterial: 7.176 — CL (ref 7.350–7.450)
pH, Arterial: 7.287 — ABNORMAL LOW (ref 7.350–7.450)
pO2, Arterial: 137 mmHg — ABNORMAL HIGH (ref 80.0–100.0)
pO2, Arterial: 90 mmHg (ref 80.0–100.0)

## 2013-06-11 LAB — COMPREHENSIVE METABOLIC PANEL
AST: 21 U/L (ref 0–37)
Albumin: 3.2 g/dL — ABNORMAL LOW (ref 3.5–5.2)
Calcium: 9.3 mg/dL (ref 8.4–10.5)
Chloride: 100 mEq/L (ref 96–112)
Creatinine, Ser: 0.58 mg/dL (ref 0.50–1.10)
Sodium: 142 mEq/L (ref 135–145)

## 2013-06-11 LAB — PRO B NATRIURETIC PEPTIDE: Pro B Natriuretic peptide (BNP): 3367 pg/mL — ABNORMAL HIGH (ref 0–125)

## 2013-06-11 MED ORDER — IPRATROPIUM BROMIDE 0.02 % IN SOLN
0.5000 mg | RESPIRATORY_TRACT | Status: DC
  Administered 2013-06-11 – 2013-06-12 (×2): 0.5 mg via RESPIRATORY_TRACT
  Filled 2013-06-11 (×2): qty 2.5

## 2013-06-11 MED ORDER — ALBUTEROL SULFATE (5 MG/ML) 0.5% IN NEBU
2.5000 mg | INHALATION_SOLUTION | RESPIRATORY_TRACT | Status: DC
  Administered 2013-06-11 – 2013-06-12 (×2): 2.5 mg via RESPIRATORY_TRACT
  Filled 2013-06-11 (×2): qty 0.5

## 2013-06-11 MED ORDER — DEXTROSE 5 % IV SOLN
500.0000 mg | Freq: Once | INTRAVENOUS | Status: AC
  Administered 2013-06-12: 500 mg via INTRAVENOUS
  Filled 2013-06-11: qty 500

## 2013-06-11 MED ORDER — PREDNISONE 20 MG PO TABS
60.0000 mg | ORAL_TABLET | Freq: Once | ORAL | Status: AC
  Administered 2013-06-11: 60 mg via ORAL
  Filled 2013-06-11: qty 3

## 2013-06-11 MED ORDER — NITROGLYCERIN 2 % TD OINT
1.0000 [in_us] | TOPICAL_OINTMENT | Freq: Four times a day (QID) | TRANSDERMAL | Status: DC
  Administered 2013-06-11: 1 [in_us] via TOPICAL
  Filled 2013-06-11: qty 1

## 2013-06-11 MED ORDER — FUROSEMIDE 10 MG/ML IJ SOLN
40.0000 mg | Freq: Once | INTRAMUSCULAR | Status: AC
  Administered 2013-06-11: 40 mg via INTRAVENOUS
  Filled 2013-06-11: qty 4

## 2013-06-11 MED ORDER — ALBUTEROL (5 MG/ML) CONTINUOUS INHALATION SOLN
20.0000 mg/h | INHALATION_SOLUTION | RESPIRATORY_TRACT | Status: DC
  Administered 2013-06-11: 20 mg/h via RESPIRATORY_TRACT
  Filled 2013-06-11: qty 20

## 2013-06-11 MED ORDER — IPRATROPIUM BROMIDE 0.02 % IN SOLN
1.5000 mg | Freq: Once | RESPIRATORY_TRACT | Status: AC
  Administered 2013-06-11: 1.5 mg via RESPIRATORY_TRACT
  Filled 2013-06-11: qty 7.5

## 2013-06-11 MED ORDER — HYDRALAZINE HCL 20 MG/ML IJ SOLN
10.0000 mg | Freq: Once | INTRAMUSCULAR | Status: AC
  Administered 2013-06-11: 10 mg via INTRAVENOUS
  Filled 2013-06-11: qty 1

## 2013-06-11 MED ORDER — ACETAMINOPHEN 325 MG PO TABS
650.0000 mg | ORAL_TABLET | Freq: Once | ORAL | Status: AC
  Administered 2013-06-11: 650 mg via ORAL
  Filled 2013-06-11: qty 2

## 2013-06-11 MED ORDER — ALBUTEROL SULFATE (5 MG/ML) 0.5% IN NEBU: 20.0000 mg | INHALATION_SOLUTION | Freq: Once | RESPIRATORY_TRACT | Status: DC

## 2013-06-11 NOTE — ED Notes (Signed)
Nitro paste removed per EDP.  

## 2013-06-11 NOTE — ED Notes (Signed)
Patient arrived via [facility] from home in respiratory distress. Patient is on home oxygen at 3L.EMS administered Albuterol x1. Patient has been out of her home medications for several months now. Patient is A/O at this time.

## 2013-06-11 NOTE — ED Provider Notes (Signed)
CSN: [id number]     Arrival date & time 06/11/13  1948 History   First MD Initiated Contact with Patient 06/11/13 1958     Chief Complaint  Patient presents with  . Respiratory Distress   (Consider location/radiation/quality/duration/timing/severity/associated sxs/prior Treatment) HPI Comments: 57 year old female with a past medical history of COPD (3 L home oxygen at baseline), non-small cell lung cancer-right upper lobe, hypertension, polysubstance abuse, tobacco abuse who presents with complaints of shortness of breath. She reports her symptoms have been present since this evening. She is still smoking and reports that she will smoke a pack of cigarettes in a short period of time if she is upset. She denies chest pain. She also denies fever, abdominal pain, nausea, vomiting, diarrhea. On review of systems she did endorse some intermittent leg swelling that he only occurs in her left leg. EMS administered an albuterol nebulizer in route with some improvement.  Patient is a 57 y.o. female presenting with shortness of breath. The history is provided by the patient.  Shortness of Breath Severity:  Severe Onset quality:  Gradual Timing:  Constant Progression:  Worsening Chronicity:  New Ineffective treatments:  None tried Associated symptoms: no abdominal pain, no chest pain, no fever, no rash and no vomiting   Risk factors: hx of cancer    Past Medical History  Diagnosis Date  . Tobacco abuse   . COPD (chronic obstructive pulmonary disease)     exacerbagtion in 6/12 and in 2010.   Marland Kitchen Hypertension   . History of radiation therapy 07/19/10 to 07/28/10    RUL lung  . 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 Alcohol abuse     2 beers a day  . Asthma   . Headache(784.0)   . Chronic hypercapnic respiratory failure   . Polysubstance abuse    Past Surgical History  Procedure Laterality Date  . No past surgeries     Family History  Problem Relation Age of Onset  . Heart disease  Neg Hx   . Cancer Mother     lung  . Cancer Father     lung  . Hypertension     History  Substance Use Topics  . Smoking status: Current Some Day Smoker -- 0.20 packs/day for 40 years    Types: Cigarettes  . Smokeless tobacco: Never Used     Comment: Cut back from 2 ppd when diagnosed with Lung cancer.12/02/11 > 3 cig/day 07/20/12  . Alcohol Use: No     Comment: Former alcoholic   OB History   Grav Para Term Preterm Abortions TAB SAB Ect Mult Living                 Review of Systems  Constitutional: Negative for fever and chills.  Respiratory: Positive for shortness of breath. Negative for chest tightness.   Cardiovascular: Negative for chest pain, palpitations and leg swelling.  Gastrointestinal: Negative for nausea, vomiting and abdominal pain.  Genitourinary: Negative for dysuria and frequency.  Musculoskeletal: Negative for back pain and gait problem.  Skin: Negative for rash.  All other systems reviewed and are negative.   Allergies  Lisinopril  Home Medications   Current Outpatient Rx  Name  Route  Sig  Dispense  Refill  . albuterol (VENTOLIN HFA) 108 (90 BASE) MCG/ACT inhaler   Inhalation   Inhale 2 puffs into the lungs every 4 (four) hours as needed for wheezing or shortness of breath.   18 g  6   . aspirin EC 81 MG tablet   Oral   Take 81 mg by mouth daily.         . feeding supplement (ENSURE COMPLETE) LIQD   Oral   Take 237 mLs by mouth 2 (two) times daily between meals.   60 Bottle   3   . ferrous sulfate 325 (65 FE) MG tablet   Oral   Take 1 tablet (325 mg total) by mouth 2 (two) times daily.   60 tablet   5   . Fluticasone-Salmeterol (ADVAIR) 500-50 MCG/DOSE AEPB   Inhalation   Inhale 1 puff into the lungs every 12 (twelve) hours.   60 each   3   . ipratropium (ATROVENT) 0.02 % nebulizer solution   Nebulization   Take 2.5 mLs (0.5 mg total) by nebulization every 6 (six) hours.   75 mL   3   . ketorolac (TORADOL) 10 MG tablet    Oral   Take 1 tablet (10 mg total) by mouth every 6 (six) hours as needed for pain.   20 tablet   0   . levalbuterol (XOPENEX) 1.25 MG/0.5ML nebulizer solution   Nebulization   Take 1.25 mg by nebulization every 6 (six) hours.   1 each   3   . metoprolol succinate (TOPROL-XL) 50 MG 24 hr tablet   Oral   Take 1 tablet (50 mg total) by mouth daily. Take with or immediately following a meal.   30 tablet   0   . mirtazapine (REMERON) 15 MG tablet   Oral   Take 1 tablet (15 mg total) by mouth at bedtime.   30 tablet   6   . predniSONE (DELTASONE) 20 MG tablet      Take 3 tablets by mouth daily for 6 more days.   18 tablet   0   . SUMAtriptan (IMITREX) 50 MG tablet   Oral   Take 1 tablet (50 mg total) by mouth once as needed for migraine.   30 tablet   2    BP 212/91  Pulse 100  Temp(Src) 98.4 F (36.9 C) (Oral)  Resp 26  SpO2 3% Physical Exam  Vitals reviewed. Constitutional: She is oriented to person, place, and time. She appears well-developed and well-nourished. She appears distressed.  HENT:  Right Ear: External ear normal.  Left Ear: External ear normal.  Mouth/Throat: No oropharyngeal exudate.  Eyes: Conjunctivae and EOM are normal. Pupils are equal, round, and reactive to light.  Neck: Normal range of motion. Neck supple.  Cardiovascular: Normal rate, regular rhythm, normal heart sounds and intact distal pulses.  Exam reveals no gallop and no friction rub.   No murmur heard. Pulmonary/Chest: She is in respiratory distress.  Prolonged expiratory phase Wheezing in all lung fields Accessory muscle use  Abdominal: Soft. Bowel sounds are normal. She exhibits no distension. There is no tenderness.  Musculoskeletal: Normal range of motion. She exhibits no edema.  Neurological: She is alert and oriented to person, place, and time. No cranial nerve deficit.  Skin: Skin is warm and dry. No rash noted.  Psychiatric: She has a normal mood and affect.    ED Course   Procedures (including critical care time) Labs Review Labs Reviewed  CBC WITH DIFFERENTIAL - Abnormal; Notable for the following:    RDW 16.1 (*)    Platelets 148 (*)    All other components within normal limits  COMPREHENSIVE METABOLIC PANEL - Abnormal; Notable for the following:  CO2 34 (*)    Glucose, Bld 130 (*)    Albumin 3.2 (*)    Alkaline Phosphatase 159 (*)    All other components within normal limits  PRO B NATRIURETIC PEPTIDE - Abnormal; Notable for the following:    Pro B Natriuretic peptide (BNP) 3367.0 (*)    All other components within normal limits  POCT I-STAT 3, BLOOD GAS (G3+) - Abnormal; Notable for the following:    pH, Arterial 7.176 (*)    pCO2 arterial 105.0 (*)    pO2, Arterial 137.0 (*)    Bicarbonate 38.9 (*)    Acid-Base Excess 6.0 (*)    All other components within normal limits  POCT I-STAT 3, BLOOD GAS (G3+) - Abnormal; Notable for the following:    pH, Arterial 7.287 (*)    pCO2 arterial 73.7 (*)    Bicarbonate 35.2 (*)    Acid-Base Excess 6.0 (*)    All other components within normal limits  TROPONIN I  BLOOD GAS, ARTERIAL  BLOOD GAS, ARTERIAL   Imaging Review Dg Chest Portable 1 View  06/11/2013   *RADIOLOGY REPORT*  Clinical Data: Respiratory distress  PORTABLE CHEST - 1 VIEW  Comparison: January 16, 2013.  Findings: Stable mild cardiomegaly.  Minimal right basilar opacity is noted most consistent with subsegmental atelectasis.  Left lung is clear.  No pleural effusion or pneumothorax is noted.  Bony thorax is intact.  IMPRESSION: Minimal right basilar subsegmental atelectasis.  No other significant abnormality seen.   Original Report Authenticated By: Lupita Raider.,  M.D.    MDM   57 year old female with a past medical history of COPD (3 L home oxygen at baseline), non-small cell lung cancer-right upper lobe, hypertension, polysubstance abuse, tobacco abuse who presents with complaints of shortness of breath. She reports her symptoms have  been present since this evening. She is still smoking and reports that she will smoke a pack of cigarettes in a short period of time if she is upset. She denies chest pain. She also denies fever, abdominal pain, nausea, vomiting, diarrhea. On review of systems she did endorse some intermittent leg swelling that he only occurs in her left leg. EMS administered an albuterol nebulizer in route with some improvement.  Exam as above, hypertensive tachypnea, prolonged expiratory phase, wheezing in all lung fields, accessory muscle use, mild to moderate respiratory distress.  Continuous neb (patient refused BiPAP), steroids, CBC, BMP, troponin, BNP, EKG, nitro paste.  PE is in the differential, but COPD versus pneumonia are felt to be more likely causes of her symptoms.  11:35 PM Patient had a headache from the nitro paste so it was removed and she was administered hydralazine. EKG with sinus tach, but is too poor of a study to interpret further due to patient movement 2/2 her respiratory status. Azithromycin ordered as she reports a productive cough. Patient is on BiPAP.  Blood gas is much improved.  Internal medicine consulted for admission.  Clinical Impression: 1. COPD with acute exacerbation   2. CHF (congestive heart failure)   3. Respiratory failure with hypercapnia     Disposition: Admit to step-down  Condition: Poor   I have discussed the results, Dx and Tx plan. They understand and agree with plan for admission.  Exam unchanged at admission.   Pt seen in conjunction with Dr. Jeraldine Loots.  Reine Just. Beverely Pace, MD Emergency Medicine PGY-III [phone]  Oleh Genin, MD 06/12/13 [phone]

## 2013-06-11 NOTE — ED Notes (Signed)
Patient placed on bedpan.

## 2013-06-12 DIAGNOSIS — R0609 Other forms of dyspnea: Secondary | ICD-10-CM

## 2013-06-12 DIAGNOSIS — J9610 Chronic respiratory failure, unspecified whether with hypoxia or hypercapnia: Secondary | ICD-10-CM

## 2013-06-12 DIAGNOSIS — R0902 Hypoxemia: Secondary | ICD-10-CM

## 2013-06-12 DIAGNOSIS — J441 Chronic obstructive pulmonary disease with (acute) exacerbation: Secondary | ICD-10-CM

## 2013-06-12 DIAGNOSIS — J449 Chronic obstructive pulmonary disease, unspecified: Secondary | ICD-10-CM

## 2013-06-12 LAB — MAGNESIUM: Magnesium: 1.2 mg/dL — ABNORMAL LOW (ref 1.5–2.5)

## 2013-06-12 LAB — BASIC METABOLIC PANEL
CO2: 35 mEq/L — ABNORMAL HIGH (ref 19–32)
Chloride: 95 mEq/L — ABNORMAL LOW (ref 96–112)
GFR calc Af Amer: >90 mL/min (ref >90)
Glucose, Bld: 129 mg/dL — ABNORMAL HIGH (ref 70–99)
Potassium: 4.6 mEq/L (ref 3.5–5.1)
Sodium: 141 mEq/L (ref 135–145)

## 2013-06-12 MED ORDER — ALBUTEROL SULFATE (5 MG/ML) 0.5% IN NEBU: 2.5000 mg | INHALATION_SOLUTION | Freq: Four times a day (QID) | RESPIRATORY_TRACT | Status: DC | PRN

## 2013-06-12 MED ORDER — PREDNISONE 20 MG PO TABS: 40.0000 mg | ORAL_TABLET | Freq: Every day | ORAL | Status: DC

## 2013-06-12 MED ORDER — DOXYCYCLINE HYCLATE 100 MG PO TABS: 100.0000 mg | ORAL_TABLET | Freq: Two times a day (BID) | ORAL | Status: DC

## 2013-06-12 MED ORDER — FLUTICASONE-SALMETEROL 500-50 MCG/DOSE IN AEPB: 1.0000 | INHALATION_SPRAY | Freq: Two times a day (BID) | RESPIRATORY_TRACT | Status: DC

## 2013-06-12 MED ORDER — HYDROCHLOROTHIAZIDE 25 MG PO TABS: 25.0000 mg | ORAL_TABLET | Freq: Every day | ORAL | Status: DC

## 2013-06-12 MED ORDER — SODIUM CHLORIDE 0.9 % IJ SOLN
3.0000 mL | Freq: Two times a day (BID) | INTRAMUSCULAR | Status: DC
  Administered 2013-06-12 (×2): 3 mL via INTRAVENOUS

## 2013-06-12 MED ORDER — IPRATROPIUM BROMIDE 0.02 % IN SOLN: 0.5000 mg | Freq: Four times a day (QID) | RESPIRATORY_TRACT | Status: DC

## 2013-06-12 MED ORDER — ENOXAPARIN SODIUM 60 MG/0.6ML ~~LOC~~ SOLN
50.0000 mg | Freq: Once | SUBCUTANEOUS | Status: AC
  Administered 2013-06-12: 04:00:00 50 mg via SUBCUTANEOUS
  Filled 2013-06-12: qty 0.6

## 2013-06-12 MED ORDER — INFLUENZA VAC SPLIT QUAD 0.5 ML IM SUSP
0.5000 mL | INTRAMUSCULAR | Status: AC
  Administered 2013-06-12: 0.5 mL via INTRAMUSCULAR
  Filled 2013-06-12: qty 0.5

## 2013-06-12 MED ORDER — HYDROCHLOROTHIAZIDE 25 MG PO TABS
25.0000 mg | ORAL_TABLET | Freq: Every day | ORAL | Status: DC
  Administered 2013-06-12: 25 mg via ORAL
  Filled 2013-06-12: qty 1

## 2013-06-12 MED ORDER — PREDNISONE 50 MG PO TABS
60.0000 mg | ORAL_TABLET | Freq: Every day | ORAL | Status: DC
  Administered 2013-06-12: 60 mg via ORAL
  Filled 2013-06-12 (×2): qty 1

## 2013-06-12 MED ORDER — ALBUTEROL SULFATE (5 MG/ML) 0.5% IN NEBU
2.5000 mg | INHALATION_SOLUTION | Freq: Four times a day (QID) | RESPIRATORY_TRACT | Status: DC
  Administered 2013-06-12 (×3): 2.5 mg via RESPIRATORY_TRACT
  Filled 2013-06-12 (×3): qty 0.5

## 2013-06-12 MED ORDER — ALBUTEROL SULFATE (5 MG/ML) 0.5% IN NEBU: 2.5000 mg | INHALATION_SOLUTION | RESPIRATORY_TRACT | Status: DC

## 2013-06-12 MED ORDER — IPRATROPIUM BROMIDE 0.02 % IN SOLN
0.5000 mg | Freq: Four times a day (QID) | RESPIRATORY_TRACT | Status: DC
  Administered 2013-06-12 (×3): 0.5 mg via RESPIRATORY_TRACT
  Filled 2013-06-12 (×3): qty 2.5

## 2013-06-12 NOTE — ED Provider Notes (Signed)
I evaluated the patient with the resident physician, Dr. Beverely Pace. I agree with the documentation, and have seen the relevant studies, including ecg (if performed)  On my exam, initially, the patient is tachypneic, clearly with respiratory compromise.  With the patient's endorsement of multiple medical problems, including COPD, CHF, polysubstance abuse, cancer, there was a wide differential considered. Notably, the patient improved substantially here following BiPAP.  Patient did require admission for further evaluation and management.  Gerhard Munch, MD 06/12/13 0020

## 2013-06-12 NOTE — Clinical Social Work Note (Signed)
Clinical Child psychotherapist received phone call from RN regarding referral for [employer] [employer]. CSW spoke with patient and informed her that our department does not handle medicaid. Patient appeared to believe that CSW was at Department of Social Services. CSW informed patient that CSW is based in hospital and patient reported that she did not need to speak with this reporter. CSW will sign off.   Rozetta Nunnery MSW, [employer] [phone]

## 2013-06-12 NOTE — Progress Notes (Signed)
Subjective: Karina Weber reports her dyspnea has significantly improved this morning. She is off the BiPap and is satting 95% on 4L [place]. She continues to complain of tenderness in her bilateral legs that is worse in her left lower leg. Of note, Karina Weber says that she ran out of her home COPD meds which include albuterol, atrovent, and advair 5 days prior to admission. She uses 2L continuous O2 while at home but was recently told by an unknown provider that she needs to increase the amount of O2 she receives.    Objective: Vital signs in last 24 hours: Filed Vitals:   06/12/13 0811 06/12/13 0915 06/12/13 1228 06/12/13 1236  BP:      Pulse:      Temp: 98 F (36.7 C)   98.6 F (37 C)  TempSrc: Oral   Oral  Resp:      Height:      Weight:      SpO2:  98% 97%     Intake/Output Summary (Last 24 hours) at 06/12/13 1309 Last data filed at 06/12/13 1236  Gross per 24 hour  Intake    240 ml  Output   2825 ml  Net  -2585 ml   Physical Exam Vitals reviewed. General: alert and oriented, well-appearing female, cooperative on exam, NAD HEENT: normocephalic, atraumatic, EOMI, pupils equal round and reactive to light, MMM, oropharynx clear Neck: supple CV: Regular rate and rhythm, normal S1 and S2, no murmurs, rubs, or gallops Lungs: Mild wheezing in all lung fields, slightly decreased breath sounds in RLL, normal work of breathing with no accessory muscle use Abdomen: Soft, non-tender, non-distended, normoactive bowel sounds MSK: Significant tenderness to palpation in left posterior lower extremity, with mild tenderness to palpation in her bilateral thighs. Negative homen's sign. Normal range of motion throughout.  Extremities: No edema, clubbing, cyanosis. Lower extremities appear symmetrical.  Neuro: Alert and oriented, CNsII-XII grossly intact, normal sensation to light touch Skin: Warm and dry. No rashes.  Psychiatric: Normal mood and affect.   Lab Results: BMET    Component Value Date/Time   NA  141 06/12/2013 0431   K 4.6 06/12/2013 0431   CL 95* 06/12/2013 0431   CO2 35* 06/12/2013 0431   GLUCOSE 129* 06/12/2013 0431   BUN 13 06/12/2013 0431   CREATININE 0.55 06/12/2013 0431   CREATININE 0.61 07/23/2012 1647   CALCIUM 9.6 06/12/2013 0431   GFRNONAA >90 06/12/2013 0431   GFRAA >90 06/12/2013 0431   Recent Labs     06/12/13  0431  MG  1.2*    Micro Results: Recent Results (from the past 240 hour(s))  MRSA PCR SCREENING     Status: None   Collection Time    06/12/13  6:23 AM      Result Value Range Status   MRSA by PCR NEGATIVE  NEGATIVE Final   Comment:            The GeneXpert MRSA Assay (FDA     approved for NASAL specimens     only), is one component of a     comprehensive MRSA colonization     surveillance program. It is not     intended to diagnose MRSA     infection nor to guide or     monitor treatment for     MRSA infections.   Studies/Results: Dg Chest Portable 1 View  06/11/2013   *RADIOLOGY REPORT*  Clinical Data: Respiratory distress  PORTABLE CHEST - 1 VIEW  Comparison: January 16, 2013.  Findings: Stable mild cardiomegaly.  Minimal right basilar opacity is noted most consistent with subsegmental atelectasis.  Left lung is clear.  No pleural effusion or pneumothorax is noted.  Bony thorax is intact.  IMPRESSION: Minimal right basilar subsegmental atelectasis.  No other significant abnormality seen.   Original Report Authenticated By: Lupita Raider.,  M.D.   Medications: I have reviewed the patient's current medications. Scheduled Meds: . albuterol  2.5 mg Nebulization QID   And  . ipratropium  0.5 mg Nebulization QID  . [START ON 06/13/2013] doxycycline  100 mg Oral Q12H  . predniSONE  60 mg Oral Q breakfast  . sodium chloride  3 mL Intravenous Q12H   Continuous Infusions:  PRN Meds:.albuterol  Assessment/Plan: 57 yo woman with PMH significant for COPD, HTN, NSCLC s/p radiation who presents for worsening shortness of breath.   #Acute Respiratory  Failure: Karina Weber presented in hypercarbic acute respiratory failure with pCO2 of 105 likely secondary to COPD exacerbation. She has had similar episodes in the past that required intubation. Staging is unknown as it is unclear if she has had PFTs. There was initially some concern for PE given her history of malignancy with intermittent left leg pain and a revised Geneva score of 10 but given her significant improvement with COPD treatment (BiPap, azithro, duonebs) with improved ABG (PCO2 74) , it is lower on the differential. Initial troponin was negative and Karina Weber had no evidence of heart failure on exam. Echo from 09/2012 with EF 65%. EKG within normal limits. Stable this morning on 4L O2 [place].  - continue duonebs q4h, prednisone 60mg , and doxycycline 100mg  bid - O2 to keep SpO2 >88%  # HTN: Stable. BP elevated to 212/91 in ED but improved with respiratory treatment and hydralazine 10mg  IV x 1 in the ED. Karina Weber has been on an anti-hypertensive regimen in the past including losartan, amlodipine, bisoprolol (d/c d/t COPD) and HCTZ.  - start HCTZ 25mg  po qdaily  #NSCLC: S/p stereotactic body radiotherapy in 2011. Found to have likely recurrence while hospitalized in April for a COPD exacerbation. CT scan on 05/24/13 showed a persistent density in the RUL which is equivocal for tumor recurrence. Saw Rad Onc on 8/28 who recommended a PET scan which needs to be scheduled.     #VTE ppx: Received lovenox x 1 dose. Ambulating well.   Dispo: Disposition is deferred at this time, awaiting improvement of current medical problems. Anticipated discharge in approximately 1-2 day(s).   The patient does have a current PCP Carlynn Purl, DO) and does need an [facility] hospital follow-up appointment after discharge.   The patient does not know have transportation limitations that hinder transportation to clinic appointments.  This is a Psychologist, occupational Note.  The care of the patient was discussed with Dr. Sherrine Maples and the assessment and  plan formulated with their assistance.  Please see their attached note for official documentation of the daily encounter.   LOS: 1 day   [employer], Med Student 06/12/2013, 1:09 PM

## 2013-06-12 NOTE — H&P (Signed)
Date: 06/12/2013               Patient Name:  Karina Weber MRN: [id number]  DOB: 12-Jun-1956 Age / Sex: 57 y.o., female   PCP: Carlynn Purl, DO         Medical Service: Internal Medicine Teaching Service         Attending Physician: Dr. Jonah Blue, DO    First Contact: Dr. Inocente Salles, MD Pager: [phone]  Second Contact: Dr. Charlsie Merles, MD Pager: [phone]       After Hours (After 5p/  First Contact Pager: [phone]  weekends / holidays): Second Contact Pager: [phone]   Chief Complaint: I can't breath  History of Present Illness: Karina Weber is a 57 y.o. female with a pmhx of COPD, lung cancer, alcohol abuse, polysubstance drug abuse who presents with a cc of being unable to breath. The patient was on bipap during the interview and conversation was difficult. She reports SOB for about a week with cough productive of increased sputum. Denies fevers, chills nauseau vomiting. Feel similar to her previous episodes of COPD.   Of note, she was discharged for a COPD exacerbation on April 16th. During that admission she was found to have likely recurrence of lung cancer. Her most recent appt with Rad Onc on Aug 28th decided to pursue PET scan for further w/u of lung mass.  In the ED, ABG (pH 7.18, pCO2 105, pO2 137) patient was put on Bipap. ABG improved to (pH 7.29, PCO2 74, pO2 90). PCCM Evaluated patient and recommended admission to internal medicine.  No CP, HA, C/D.  Meds: Current Facility-Administered Medications  Medication Dose Route Frequency Provider Last Rate Last Dose  . ipratropium (ATROVENT) nebulizer solution 0.5 mg  0.5 mg Nebulization Q4H Oleh Genin, MD   0.5 mg at 06/11/13 2306   And  . albuterol (PROVENTIL) (5 MG/ML) 0.5% nebulizer solution 2.5 mg  2.5 mg Nebulization Q4H Oleh Genin, MD   2.5 mg at 06/11/13 2306  . albuterol (PROVENTIL,VENTOLIN) solution continuous neb  20 mg/hr Nebulization Continuous Oleh Genin, MD   20 mg/hr at 06/11/13 2037    Current Outpatient Prescriptions  Medication Sig Dispense Refill  . albuterol (VENTOLIN HFA) 108 (90 BASE) MCG/ACT inhaler Inhale 2 puffs into the lungs every 4 (four) hours as needed for wheezing or shortness of breath.  18 g  6  . aspirin EC 81 MG tablet Take 81 mg by mouth daily.      . Fluticasone-Salmeterol (ADVAIR) 500-50 MCG/DOSE AEPB Inhale 1 puff into the lungs every 12 (twelve) hours.  60 each  3  . ipratropium (ATROVENT) 0.02 % nebulizer solution Take 2.5 mLs (0.5 mg total) by nebulization every 6 (six) hours.  75 mL  3  . [DISCONTINUED] ferrous gluconate (FERGON) 325 MG tablet Take 325 mg by mouth 3 (three) times daily with meals.        . [DISCONTINUED] losartan (COZAAR) 100 MG tablet Take 1 tablet (100 mg total) by mouth daily.  31 tablet  11    Allergies: Allergies as of 06/11/2013 - Review Complete 06/11/2013  Allergen Reaction Noted  . Lisinopril Cough 07/28/2011   Past Medical History  Diagnosis Date  . Tobacco abuse   . COPD (chronic obstructive pulmonary disease)     exacerbagtion in 6/12 and in 2010.   Marland Kitchen Hypertension   . History of radiation therapy 07/19/10 to 07/28/10    RUL lung  . 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 Alcohol abuse     2 beers a day  . Asthma   . Headache(784.0)   . Chronic hypercapnic respiratory failure   . Polysubstance abuse    Past Surgical History  Procedure Laterality Date  . No past surgeries     Family History  Problem Relation Age of Onset  . Heart disease Neg Hx   . Cancer Mother     lung  . Cancer Father     lung  . Hypertension     History   Social History  . Marital Status: Single    Spouse Name: N/A    Number of Children: N/A  . Years of Education: N/A   Occupational History  . Not on file.   Social History Main Topics  . Smoking status: Current Some Day Smoker -- 0.20 packs/day for 40 years    Types: Cigarettes  . Smokeless tobacco: Never Used     Comment: Cut back from 2 ppd when  diagnosed with Lung cancer.12/02/11 > 3 cig/day 07/20/12  . Alcohol Use: No     Comment: Former alcoholic  . Drug Use: Yes    Special: Cocaine, Marijuana  . Sexual Activity: Not on file   Other Topics Concern  . Not on file   Social History Narrative   Lives with a roommate (mutual grandmother to grandchildren). She takes care of herself, does not have any assistance when she is sick. He is able to perform ADLs when she is not sick . Used to work in [employer], retired 2 years ago . Education: 9th grade . SHe on Medicaid now.       Smokes 1 pack per day (since age 62 y.o)   Drinks 4 cans of beer every day or week.    Denies drugs though UDS multiple admissions is positive.        As of 01/16/13 still smoking 1 ppd. Smoking since age 7. Drinks 1-4 cans of beer 12 oz cans though patient will not say how frequently throughout the week    Review of Systems: Pertinent items are noted in HPI.  Physical Exam: Blood pressure 132/63, pulse 102, temperature 98.4 F (36.9 C), temperature source Oral, resp. rate 22, SpO2 97.00%. Physical Exam  Constitutional: She is oriented to person, place, and time. She appears well-developed and well-nourished.  HENT:  Mouth/Throat: Oropharynx is clear and moist. No oropharyngeal exudate.  Cardiovascular: Normal rate, regular rhythm, normal heart sounds and intact distal pulses.  Exam reveals no gallop and no friction rub.   No murmur heard. Pulmonary/Chest: She is in respiratory distress. She has wheezes. She has no rales. She exhibits no tenderness.  On bipap. Diffuse wheezing, much improved from presentation.  Abdominal: Soft. Bowel sounds are normal. She exhibits no distension. There is no tenderness.  Neurological: She is alert and oriented to person, place, and time.  Psychiatric: She has a normal mood and affect. Her behavior is normal.   Lab results: Basic Metabolic Panel:  Recent Labs  09/81/19 2100  NA 142  K 4.5  CL 100  CO2 34*   GLUCOSE 130*  BUN 15  CREATININE 0.58  CALCIUM 9.3   Liver Function Tests:  Recent Labs  06/11/13 2100  AST 21  ALT 16  ALKPHOS 159*  BILITOT 0.3  PROT 6.8  ALBUMIN 3.2*   CBC:  Recent Labs  06/11/13 2100  WBC 7.8  NEUTROABS 5.5  HGB 13.1  HCT 42.6  MCV  96.4  PLT 148*   Cardiac Enzymes:  Recent Labs  06/11/13 2100  TROPONINI <0.30   BNP:  Recent Labs  06/11/13 2100  PROBNP 3367.0*   Urine Drug Screen: Drugs of Abuse     Component Value Date/Time   LABOPIA NONE DETECTED 01/17/2013 0622   LABOPIA NEGATIVE 07/09/2012 1535   COCAINSCRNUR NONE DETECTED 01/17/2013 0622   COCAINSCRNUR POSITIVE* 07/09/2012 1535   LABBENZ NONE DETECTED 01/17/2013 0622   LABBENZ NEGATIVE 07/09/2012 1535   AMPHETMU NONE DETECTED 01/17/2013 0622   AMPHETMU NEGATIVE 07/09/2012 1535   THCU NONE DETECTED 01/17/2013 0622   LABBARB NONE DETECTED 01/17/2013 0622    Imaging results:  Dg Chest Portable 1 View  06/11/2013   *RADIOLOGY REPORT*  Clinical Data: Respiratory distress  PORTABLE CHEST - 1 VIEW  Comparison: January 16, 2013.  Findings: Stable mild cardiomegaly.  Minimal right basilar opacity is noted most consistent with subsegmental atelectasis.  Left lung is clear.  No pleural effusion or pneumothorax is noted.  Bony thorax is intact.  IMPRESSION: Minimal right basilar subsegmental atelectasis.  No other significant abnormality seen.   Original Report Authenticated By: Lupita Raider.,  M.D.   Assessment & Plan by Problem: Principal Problem:   COPD with acute exacerbation Active Problems:   ADENOCARCINOMA, RIGHT LUNG   TOBACCO USE   ESSENTIAL HYPERTENSION   Protein calorie malnutrition  57 yo woman with history of COPD, HTN & NSCLC s/p radiation admitted on 06/12/13 with complaints of SOB.   #Acute Respiratory Failure: Guarded, but stable.  Given similar presentation to the past, h/o severe COPD and sx of cough with sputum production, most consistent with COPD exacerbation. Patient  with h/o intubation, but uncertain if PFTs have been done. Given h/o malignancy and intermittent left leg pain, there is concern for PE, with revised geneva score 10. Initial ABG on 5L without Aa gradient, but repeat ABG on Bipap with Aa gradient of 176. Initial troponin negative, sx began day prior to admission and no s/s of HF, so ACS less likely, but EKG with significant artifact due to distress. Echo in 09/2012 with EF 65%.  -Admit to SDU (inpatient status)  -Duonebs q4h, Prednisone 60mg  daily, Doxycycline 100mg  BID (Azithro x 1 in ED)  -BiPap to keep SpO2 88-92%  -Lovenox 1mg /kg x 1 dose empirically, and consider CTA once respiratory status stable vs repeat ABG  -Repeat AM EKG   #HTN: Stable. BP elevated to 212/91 in ED but improved with respiratory recuperation & Hydralazine 10mg  IV x 1 in ED.  -Patient has been on anti-hypertensive regimen in the past including losartan, amlodipine, bisoprolol (d/c d/t COPD) & HCTZ   #NSCLC: s/p stereotactic body radiotherapy in 2011; Most recently followed up at Radiation Onc on 05/30/13. CT scan on 05/24/13 showed persistent density in RUL which is equivocal for tumor recurrence. PET scan has been scheduled.   #VTE ppx: lovenox 1mg /kg x 1 dose; will need to be re-addressed in the morning     Dispo: Disposition is deferred at this time, awaiting improvement of current medical problems. Anticipated discharge in approximately 2-3 day(s).   The patient does have a current PCP Carlynn Purl, DO) and does need an [facility] hospital follow-up appointment after discharge.  The patient does not know have transportation limitations that hinder transportation to clinic appointments.  Signed: Pleas Koch, MD 06/12/2013, 2:04 AM

## 2013-06-12 NOTE — H&P (Signed)
INTERNAL MEDICINE TEACHING SERVICE Attending Admission Note  Date: 06/12/2013  Patient name: [name]  Medical record number: [id number]  Date of birth: [birth year]    I have seen and evaluated Karina Weber and discussed their care with the Residency Team. 57 yr old female with hx COPD (stage?), possibly recurrent lung CA, alcohol abuse, polysubstance abuse, presented with SOB.  She was noted to have evidence of acute hypercarbic, hypoxic respiratory failure. She was treated with BiPAP, scheduled nebulizer with alb/ipratrop, solumedrol.   She was given one dose of full anticoagulation dose Lovenox due to concern for possible PE. This morning her clinical status has dramatically improved. She complains of some RLE swelling and pain. At this time, obtain doppler venous of RLE to r/o DVT.  Her clinical picture is more consistent with AECOPD.  I do not suspect PE at this time given her rapid improvement overnight.  She does admit to running out of Advair, atrovent, albuterol at home which likely sparked her symptoms and presentation.   If Doppler LE negative for DVT, D/C on 5 days of doxycycline and 5 days of prednisone 60 mg PO daily.  She can follow up in clinic. She is eager to go home today. Ambulate to determine O2 requirement, if any. She will need to follow up with RadOnc due to PET scan recs due to finding of possible recurrence of NSCLC.  Jonah Blue, DO, FACP Faculty [facility] Internal Medicine Residency Program 06/12/2013, 3:04 PM

## 2013-06-12 NOTE — ED Notes (Signed)
BiPap removed.

## 2013-06-12 NOTE — Progress Notes (Signed)
Unable to find a ride, Child psychotherapist called, awaiting call back.

## 2013-06-12 NOTE — Progress Notes (Signed)
*  PRELIMINARY RESULTS* Vascular Ultrasound Lower extremity venous duplex has been completed.  Preliminary findings: negative for DVT and baker's cyst.   Farrel Demark, RDMS, RVT  06/12/2013, 1:58 PM

## 2013-06-12 NOTE — Progress Notes (Signed)
Case management notified patient is to be discharged and oxygen ordered. Patient still trying to find a ride.  Continue to  Monitor.

## 2013-06-12 NOTE — Progress Notes (Signed)
I have seen the patient and reviewed the daily progress note by Davina Poke, MS IV and discussed the care of the patient with her.  See below for documentation of my findings, assessment, and plans.  Subjective: Pt states that she is feeling much better since her admission. Her SOB has improved. She was placed on Bipap on admission, but is now on nasal canula with good O2 saturation. She is still having LLE edema and tenderness.   Objective: Vital signs in last 24 hours: Filed Vitals:   06/12/13 0811 06/12/13 0915 06/12/13 1228 06/12/13 1236  BP:      Pulse:      Temp: 98 F (36.7 C)   98.6 F (37 C)  TempSrc: Oral   Oral  Resp:      Height:      Weight:      SpO2:  98% 97%    Weight change:   Intake/Output Summary (Last 24 hours) at 06/12/13 1418 Last data filed at 06/12/13 1236  Gross per 24 hour  Intake    240 ml  Output   2825 ml  Net  -2585 ml   Vitals reviewed. General: Sitting in a chair, NAD HEENT: PERRL, EOMI Cardiac: RRR, no rubs, murmurs or gallops Pulm: Mild wheezes in upper lung fields bilaterally.  Abd: soft, nontender, nondistended Ext: warm and well perfused. Trace to 1+ pitting edema of LLE and TTP in popliteal region  Neuro: alert and oriented X3, cranial nerves II-XII grossly intact, nonfocal in BU&LE  Lab Results: Reviewed and documented in Electronic Record Micro Results: Reviewed and documented in Electronic Record Studies/Results: Reviewed and documented in Electronic Record  Medications: I have reviewed the patient's current medications. Scheduled Meds: . albuterol  2.5 mg Nebulization QID   And  . ipratropium  0.5 mg Nebulization QID  . [START ON 06/13/2013] doxycycline  100 mg Oral Q12H  . hydrochlorothiazide  25 mg Oral Daily  . predniSONE  60 mg Oral Q breakfast  . sodium chloride  3 mL Intravenous Q12H   Continuous Infusions:  PRN Meds:.albuterol  Assessment/Plan: 57 yo woman with PMH significant for COPD, HTN, NSCLC s/p  radiation who presents for worsening shortness of breath.   #Acute Respiratory Failure: Pt presented in hypercarbic acute respiratory failure with pCO2 of 105 likely secondary to COPD exacerbation. She has had similar episodes in the past that required intubation, but has never had PFTs, so staging is unknown. Initially we were concerned for PE given her history of malignancy with intermittent left leg pain and edema and a revised Geneva score of 10, but lower extremity duplex exam revealed only a Baker's cyst, no thrombus. Initial troponin was negative and pt had no evidence of heart failure on exam. Echo from 09/2012 with EF 65%. EKG within normal limits. Given her significant improvement with COPD treatment (BiPap, azithro, duonebs) with improved ABG (PCO2 74) , her sx were thought to be 2/2 to a COPD exacerbation.  She was placed on bipap on admission, but is now stable this morning on 4L O2 [place].  - continue duonebs q4h, prednisone 60mg , and doxycycline 100mg  bid  - O2 to keep SpO2 >88%   # HTN: Stable. BP elevated to 212/91 in ED but improved with respiratory treatment and hydralazine 10mg  IV x 1 in the ED. Pt has been on an anti-hypertensive regimen in the past including losartan, amlodipine, bisoprolol (d/c d/t COPD) and HCTZ.  - start HCTZ 25mg  po qdaily; will need to titrate  BP control as needed  #NSCLC: S/p stereotactic body radiotherapy in 2011. Found to have likely recurrence while hospitalized in April for a COPD exacerbation. CT scan on 05/24/13 showed a persistent density in the RUL which is equivocal for tumor recurrence. Saw Rad Onc on 8/28 who recommended a PET scan which needs to be scheduled.   #DVT PPx: Received lovenox x 1 therapeutic dose. Ambulating well.    #Dispo: D/c to home today   The patient does have a current PCP Carlynn Purl, DO) and does need an [facility] hospital follow-up appointment after discharge.  The patient does not have transportation limitations that hinder  transportation to clinic appointments.  .Services Needed at time of discharge: Y = Yes, Blank = No PT:   OT:   RN:   Equipment:  Continue Home O2  Other:     LOS: 1 day   Genelle Gather, MD 06/12/2013, 2:18 PM

## 2013-06-12 NOTE — Discharge Summary (Signed)
Name: Karina Weber MRN: [id number] DOB: [birth year] 57 y.o. PCP: Carlynn Purl, DO  Date of Admission: 06/11/2013  7:48 PM Date of Discharge: 06/12/2013 Attending Physician: Jonah Blue, DO  Discharge Diagnosis: 1. Acute hypercarbic respiratory failure 2. COPD 3. Non-small cell lung cancer, right lung 4. Hypertension   Discharge Medications:   Medication List         albuterol 108 (90 BASE) MCG/ACT inhaler  Commonly known as:  VENTOLIN HFA  Inhale 2 puffs into the lungs every 4 (four) hours as needed for wheezing or shortness of breath.     albuterol (5 MG/ML) 0.5% nebulizer solution  Commonly known as:  PROVENTIL  Take 0.5 mLs (2.5 mg total) by nebulization every 6 (six) hours as needed for wheezing or shortness of breath.     aspirin EC 81 MG tablet  Take 81 mg by mouth daily.     doxycycline 100 MG tablet  Commonly known as:  VIBRA-TABS  Take 1 tablet (100 mg total) by mouth every 12 (twelve) hours.  Start taking on:  06/13/2013     Fluticasone-Salmeterol 500-50 MCG/DOSE Aepb  Commonly known as:  ADVAIR  Inhale 1 puff into the lungs every 12 (twelve) hours.     hydrochlorothiazide 25 MG tablet  Commonly known as:  HYDRODIURIL  Take 1 tablet (25 mg total) by mouth daily.     ipratropium 0.02 % nebulizer solution  Commonly known as:  ATROVENT  Take 2.5 mLs (0.5 mg total) by nebulization every 6 (six) hours.     predniSONE 20 MG tablet  Commonly known as:  DELTASONE  Take 2 tablets (40 mg total) by mouth daily with breakfast.       Disposition and follow-up:   [name] was discharged from [facility] in Good condition.  At the hospital follow up visit please address:  1.  Whether she is taking her COPD and HTN medications. Please check her pulse ox to make sure she is receiving enough home oxygen to keep SpO2>88%. 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  2.  Labs / imaging needed at time of follow-up: PFTs 4-6 weeks after resolution of her COPD exacerbation, if she has not had them completed in the past (could not find in the system), in order to stage her COPD.   3.  Pending labs/ test needing follow-up: None  Follow-up Appointments: Follow-up Information   Follow up with Gust Rung, DO On 06/17/2013. (At 3:15 pm )    Specialty:  Internal Medicine   Contact information:   [address] [place] [address] [phone]       Discharge Instructions: Discharge Orders   Future Appointments Provider Department Dept Phone   06/17/2013 3:15 PM Carlynn Purl, DO [facility] [phone]   07/01/2013 11:00 AM [facility] [phone]   Pt should arrive15 minutes prior to scheduled appt time. Please inform patient that exam will take a minimum of 1 1/2 hours. Patient to be NPO 6 hours prior to exam  and should not take any insulin the day of exam.   07/03/2013 1:30 PM Jonna Coup, MD [facility] [phone]   Future Orders Complete By Expires   Call MD for:  difficulty breathing, headache or visual disturbances  As  directed    Call MD for:  extreme fatigue  As directed    Call MD for:  persistant nausea and vomiting  As directed    Diet - low sodium heart healthy  As directed    Discharge instructions  As directed    Comments:     Be sure to take your antibiotic twice a day until it is finished.  We are sending you home on Prednisone 40mg  (you will take #2 20mg  tablets daily). You are to continue to take Prednisone for the next 4 days.  You can use your nebulizer every 6 hours as needed for shortness of breath. If you feel like your symptoms have not improved or have worsened over the next few days, please call the clinic.   Increase activity slowly  As  directed       Consultations: None.   Procedures Performed:  Ct Chest W Contrast  05/24/2013   *RADIOLOGY REPORT*  Clinical Data: Follow up lung cancer  CT CHEST WITH CONTRAST  Technique:  Multidetector CT imaging of the chest was performed following the standard protocol during bolus administration of intravenous contrast.  Contrast: 80mL OMNIPAQUE IOHEXOL 300 MG/ML  SOLN  Comparison: 01/16/2013  Findings: No pleural effusion identified.  There is mild centrilobular emphysema identified.  The within the posterior medial right upper lobe there is an irregular density measuring 2.9 x 1.4 cm, image 20/series 5.  This is compared with 3.2 x 1.4 cm previously.  The small pulmonary nodules are scattered throughout both lungs.  Index nodule within the anterior right upper lobe is stable measuring 4 mm, image 20/series 5.  Index nodule within the basilar portion of the right upper lobe measures 3 mm and is stable from previous exam. Persistent sub solid density within the superior segment of the right lower lobe is noted, image 27/series 5 and coronal image 64/series 602.  New plate-like atelectasis is identified within the posterior medial left lower lobe.  Moderate cardiac enlargement.  No pericardial effusion.  Enlarged right paratracheal lymph node measures 1.6 cm, image 21/series 2. This is not significantly changed from previous exam.  Enlarged right paratracheal lymph node measures 1 cm, image 15/series 2. This is compared with the same previously.  There is no axillary or supraclavicular adenopathy.  Limited imaging through the upper abdomen shows mild diffuse low attenuation throughout the liver compatible with hepatic steatosis.  Review of the visualized osseous structures is significant for mild spondylosis.  Stable superior endplate deformity involving the T5 vertebra.  IMPRESSION:  1.  Persistent, stable density in the posterior medial right upper lobe which is equivocal for tumor recurrence.  Further  evaluation with PET CT is recommended. 2.  Persistent enlarged mediastinal lymph nodes.  Cannot rule out metastatic adenopathy. 3.  Stable sub solid lesion within the superior segment of the right lower lobe.  Attention on follow-up imaging advised.   Original Report Authenticated By: Signa Kell, M.D.   Dg Chest Portable 1 View  06/11/2013   *RADIOLOGY REPORT*  Clinical Data: Respiratory distress  PORTABLE CHEST - 1 VIEW  Comparison: January 16, 2013.  Findings: Stable mild cardiomegaly.  Minimal right basilar opacity is noted most consistent with subsegmental atelectasis.  Left lung is clear.  No pleural effusion or pneumothorax is noted.  Bony thorax is intact.  IMPRESSION: Minimal right basilar subsegmental atelectasis.  No other significant abnormality seen.   Original Report Authenticated By: Lupita Raider.,  M.D.   Admission HPI: Karina Que  Weber is a 57 y.o. female with a pmhx of COPD, lung cancer, alcohol abuse, polysubstance drug abuse who presents with a cc of being unable to breath. The patient was on bipap during the interview and conversation was difficult. She reports SOB for about a week with cough productive of increased sputum. Denies fevers, chills nauseau vomiting. Feel similar to her previous episodes of COPD. She states that she ran out of her COPD medications which include albuterol, atrovent, and advair 5 days ago. She has been requiring continuous O2 at 2L at home but was recently told by an unknown provider that this likely wasn't enough to maintain her O2 saturations.   Of note, she was discharged for a COPD exacerbation on April 16th. During that admission she was found to have likely recurrence of lung cancer. Her most recent appt with Rad Onc on Aug 28th decided to pursue PET scan for further w/u of lung mass.   In the ED, ABG (pH 7.18, pCO2 105, pO2 137) patient was put on Bipap. ABG improved to (pH 7.29, PCO2 74, pO2 90). PCCM Evaluated patient and recommended admission to  internal medicine. Review of Systems:  Pertinent items are noted in HPI.  Physical Exam:  Blood pressure 132/63, pulse 102, temperature 98.4 F (36.9 C), temperature source Oral, resp. rate 22, SpO2 97.00%.  Physical Exam  Constitutional: She is oriented to person, place, and time. She appears well-developed and well-nourished.  HENT:  Mouth/Throat: Oropharynx is clear and moist. No oropharyngeal exudate.  Cardiovascular: Normal rate, regular rhythm, normal heart sounds and intact distal pulses. Exam reveals no gallop and no friction rub.  No murmur heard.  Pulmonary/Chest: She is in respiratory distress. She has wheezes. She has no rales. She exhibits no tenderness.  On bipap. Diffuse wheezing, much improved from presentation.  Abdominal: Soft. Bowel sounds are normal. She exhibits no distension. There is no tenderness.  Neurological: She is alert and oriented to person, place, and time.  Psychiatric: She has a normal mood and affect. Her behavior is normal.   Hospital Course by problem list:   Acute respiratory failure: Pt presented in hypercarbic acute respiratory failure with pCO2 of 105 likely secondary to COPD exacerbation. She has had similar episodes in the past that required intubation, but has never had PFTs, so staging is unknown. In the ED, she was started on BiPap, Azithromycin, and duonebs with improvement of her symptoms. Repeat ABG showed pCO2 74 and pO2 90. There was initially some concern for PE given her history of malignancy with intermittent posterior left leg pain and a revised Geneva score of 10 but this was essentially ruled out given improvement of her symptoms with COPD treatment and negative BLE dopplers. Initial troponin was negative and pt had no evidence of heart failure on exam. Echo from 09/2012 with EF 65%. EKG within normal limits. She was started on prednisone 60mg  qdaily and doxycycline 100mg  bid. On the morning after admission, she was stable on 4L O2 [place]. A  6-minute walk test was performed which indicated that she requires 3-4L O2 to maintain O2 sats >88%. At discharge, she will need to continue prednisone 60mg  x 5 days (9/10 - 9/14) and doxycycline 100mg  x 5 days (9/10- 9/14). Will also refill her COPD medications (ie atrovent, advair, and albuterol). Pt states she has a nebulizer machine at home. She will need PFTs 4-6 weeks after resolution of her symptoms to stage her COPD if she has not had them in the past- no  record of PFTs was found in the system.  Hypertension: BP elevated to 212/91 in ED but improved with respiratory treatment and hydralazine IV x 1 in the ED. Per records, pt has been on several BP meds in the past including losartan, amlodipine, bisprolol (discontinued due to COPD) and HCTZ. She was not taking any at the time of admission. Will start HCTZ 25mg  qdaily. She will need BP checks and further management of her BP meds as an outpatient.   NSCLC: S/p stereotactic body radiotherapy in 2011. Found to have likely recurrence while hospitalized in April for a COPD exacerbation. CT scan on 05/24/13 showed a persistent density in the RUL which is equivocal for tumor recurrence. Saw Rad Onc on 8/28 who recommended a PET scan which is scheduled for 9/29.   Baker's cyst: Incidentally discovered when obtaining Dopplers to rule out DVT. This explains her left lower extremity pain that is worse in the popliteal region. Given the persistent pain she has in this area, she may require arthrocentesis or steroids injections as an outpatient.   Discharge Vitals:   BP 157/71  Pulse 87  Temp(Src) 98.6 F (37 C) (Oral)  Resp 21  Ht 5' (1.524 m)  Wt 113 lb 12.1 oz (51.6 kg)  BMI 22.22 kg/m2  SpO2 95%  Discharge Labs:  Results for orders placed during the hospital encounter of 06/11/13 (from the past 24 hour(s))  CBC WITH DIFFERENTIAL     Status: Abnormal   Collection Time    06/11/13  9:00 PM      Result Value Range   WBC 7.8  4.0 - 10.5 K/uL    RBC 4.42  3.87 - 5.11 MIL/uL   Hemoglobin 13.1  12.0 - 15.0 g/dL   HCT 16.1  09.6 - 04.5 %   MCV 96.4  78.0 - 100.0 fL   MCH 29.6  26.0 - 34.0 pg   MCHC 30.8  30.0 - 36.0 g/dL   RDW 40.9 (*) 81.1 - 91.4 %   Platelets 148 (*) 150 - 400 K/uL   Neutrophils Relative % 71  43 - 77 %   Neutro Abs 5.5  1.7 - 7.7 K/uL   Lymphocytes Relative 17  12 - 46 %   Lymphs Abs 1.3  0.7 - 4.0 K/uL   Monocytes Relative 10  3 - 12 %   Monocytes Absolute 0.8  0.1 - 1.0 K/uL   Eosinophils Relative 2  0 - 5 %   Eosinophils Absolute 0.1  0.0 - 0.7 K/uL   Basophils Relative 0  0 - 1 %   Basophils Absolute 0.0  0.0 - 0.1 K/uL  COMPREHENSIVE METABOLIC PANEL     Status: Abnormal   Collection Time    06/11/13  9:00 PM      Result Value Range   Sodium 142  135 - 145 mEq/L   Potassium 4.5  3.5 - 5.1 mEq/L   Chloride 100  96 - 112 mEq/L   CO2 34 (*) 19 - 32 mEq/L   Glucose, Bld 130 (*) 70 - 99 mg/dL   BUN 15  6 - 23 mg/dL   Creatinine, Ser 7.82  0.50 - 1.10 mg/dL   Calcium 9.3  8.4 - 95.6 mg/dL   Total Protein 6.8  6.0 - 8.3 g/dL   Albumin 3.2 (*) 3.5 - 5.2 g/dL   AST 21  0 - 37 U/L   ALT 16  0 - 35 U/L   Alkaline Phosphatase 159 (*) 39 -  117 U/L   Total Bilirubin 0.3  0.3 - 1.2 mg/dL   GFR calc non Af Amer >90  >90 mL/min   GFR calc Af Amer >90  >90 mL/min  TROPONIN I     Status: None   Collection Time    06/11/13  9:00 PM      Result Value Range   Troponin I <0.30  <0.30 ng/mL  PRO B NATRIURETIC PEPTIDE     Status: Abnormal   Collection Time    06/11/13  9:00 PM      Result Value Range   Pro B Natriuretic peptide (BNP) 3367.0 (*) 0 - 125 pg/mL  POCT I-STAT 3, BLOOD GAS (G3+)     Status: Abnormal   Collection Time    06/11/13  9:00 PM      Result Value Range   pH, Arterial 7.176 (*) 7.350 - 7.450   pCO2 arterial 105.0 (*) 35.0 - 45.0 mmHg   pO2, Arterial 137.0 (*) 80.0 - 100.0 mmHg   Bicarbonate 38.9 (*) 20.0 - 24.0 mEq/L   TCO2 42  0 - 100 mmol/L   O2 Saturation 98.0     Acid-Base Excess  6.0 (*) 0.0 - 2.0 mmol/L   Patient temperature 98.4 F     Collection site RADIAL, ALLEN'S TEST ACCEPTABLE     Drawn by RT     Sample type ARTERIAL     Comment NOTIFIED PHYSICIAN    POCT I-STAT 3, BLOOD GAS (G3+)     Status: Abnormal   Collection Time    06/11/13 11:26 PM      Result Value Range   pH, Arterial 7.287 (*) 7.350 - 7.450   pCO2 arterial 73.7 (*) 35.0 - 45.0 mmHg   pO2, Arterial 90.0  80.0 - 100.0 mmHg   Bicarbonate 35.2 (*) 20.0 - 24.0 mEq/L   TCO2 37  0 - 100 mmol/L   O2 Saturation 95.0     Acid-Base Excess 6.0 (*) 0.0 - 2.0 mmol/L   Patient temperature 98.4 F     Collection site RADIAL, ALLEN'S TEST ACCEPTABLE     Drawn by RT     Sample type ARTERIAL     Comment NOTIFIED PHYSICIAN    BASIC METABOLIC PANEL     Status: Abnormal   Collection Time    06/12/13  4:31 AM      Result Value Range   Sodium 141  135 - 145 mEq/L   Potassium 4.6  3.5 - 5.1 mEq/L   Chloride 95 (*) 96 - 112 mEq/L   CO2 35 (*) 19 - 32 mEq/L   Glucose, Bld 129 (*) 70 - 99 mg/dL   BUN 13  6 - 23 mg/dL   Creatinine, Ser 4.09  0.50 - 1.10 mg/dL   Calcium 9.6  8.4 - 81.1 mg/dL   GFR calc non Af Amer >90  >90 mL/min   GFR calc Af Amer >90  >90 mL/min  MAGNESIUM     Status: Abnormal   Collection Time    06/12/13  4:31 AM      Result Value Range   Magnesium 1.2 (*) 1.5 - 2.5 mg/dL  MRSA PCR SCREENING     Status: None   Collection Time    06/12/13  6:23 AM      Result Value Range   MRSA by PCR NEGATIVE  NEGATIVE    Signed: Genelle Gather, MD 06/12/2013, 3:24 PM   Time Spent on Discharge:35 minutes Services Ordered on  Discharge: None Equipment Ordered on Discharge: None

## 2013-06-12 NOTE — Progress Notes (Signed)
Patient aware of discharge and trying to find a ride home.  Will continue to try and find a ride.

## 2013-06-12 NOTE — Progress Notes (Signed)
Has voucher for taxi, and [employer] notified.  Patient dc'd at 50.

## 2013-06-12 NOTE — Progress Notes (Signed)
Pt walked without oxygen on RA, sats dropped to 79%, 2L applied sats 86%

## 2013-06-12 NOTE — Progress Notes (Signed)
Utilization review completed.  

## 2013-06-13 DIAGNOSIS — R609 Edema, unspecified: Secondary | ICD-10-CM

## 2013-06-13 NOTE — Discharge Summary (Signed)
  Date: 06/13/2013  Patient name: Karina Weber  Medical record number: [id number]  Date of birth: 10/18/1955   This patient has been seen and the plan of care was discussed with the house staff. Please see their note for complete details. I concur with their findings and plan.  Jonah Blue, DO, FACP [facility] 06/13/2013, 1:45 PM

## 2013-06-17 ENCOUNTER — Encounter: Payer: Self-pay | Admitting: Internal Medicine

## 2013-06-17 ENCOUNTER — Ambulatory Visit (INDEPENDENT_AMBULATORY_CARE_PROVIDER_SITE_OTHER): Payer: Medicare Other | Admitting: Internal Medicine

## 2013-06-17 VITALS — BP 172/85 | HR 85 | Temp 98.0°F | Ht 60.0 in | Wt 97.8 lb

## 2013-06-17 DIAGNOSIS — F17200 Nicotine dependence, unspecified, uncomplicated: Secondary | ICD-10-CM

## 2013-06-17 DIAGNOSIS — E46 Unspecified protein-calorie malnutrition: Secondary | ICD-10-CM

## 2013-06-17 DIAGNOSIS — F329 Major depressive disorder, single episode, unspecified: Secondary | ICD-10-CM

## 2013-06-17 DIAGNOSIS — J449 Chronic obstructive pulmonary disease, unspecified: Secondary | ICD-10-CM

## 2013-06-17 DIAGNOSIS — G589 Mononeuropathy, unspecified: Secondary | ICD-10-CM

## 2013-06-17 DIAGNOSIS — G629 Polyneuropathy, unspecified: Secondary | ICD-10-CM

## 2013-06-17 DIAGNOSIS — I10 Essential (primary) hypertension: Secondary | ICD-10-CM

## 2013-06-17 DIAGNOSIS — R7301 Impaired fasting glucose: Secondary | ICD-10-CM | POA: Insufficient documentation

## 2013-06-17 LAB — GLUCOSE, CAPILLARY: Glucose-Capillary: 107 mg/dL — ABNORMAL HIGH (ref 70–99)

## 2013-06-17 LAB — BASIC METABOLIC PANEL WITH GFR
BUN: 23 mg/dL (ref 6–23)
CO2: 38 mEq/L — ABNORMAL HIGH (ref 19–32)
Calcium: 9.5 mg/dL (ref 8.4–10.5)
Creat: 0.66 mg/dL (ref 0.50–1.10)
GFR, Est African American: >89 mL/min
Glucose, Bld: 106 mg/dL — ABNORMAL HIGH (ref 70–99)
Potassium: 4.5 mEq/L (ref 3.5–5.3)
Sodium: 139 mEq/L (ref 135–145)

## 2013-06-17 LAB — POCT GLYCOSYLATED HEMOGLOBIN (HGB A1C): Hemoglobin A1C: 5.6

## 2013-06-17 LAB — MAGNESIUM: Magnesium: 1.6 mg/dL (ref 1.5–2.5)

## 2013-06-17 MED ORDER — LOSARTAN POTASSIUM 50 MG PO TABS: 50.0000 mg | ORAL_TABLET | Freq: Every day | ORAL | Status: AC

## 2013-06-17 MED ORDER — HYDROCHLOROTHIAZIDE 25 MG PO TABS: 25.0000 mg | ORAL_TABLET | Freq: Every day | ORAL | Status: AC

## 2013-06-17 MED ORDER — GABAPENTIN 300 MG PO CAPS: 300.0000 mg | ORAL_CAPSULE | Freq: Every day | ORAL | Status: DC

## 2013-06-17 MED ORDER — BUPROPION HCL 100 MG PO TABS: 100.0000 mg | ORAL_TABLET | Freq: Three times a day (TID) | ORAL | Status: DC

## 2013-06-17 NOTE — Assessment & Plan Note (Signed)
Patient reports symptoms of neuropathy and has positive straight leg raise.  Will check A1C to screen for diabetes and try Gabapentin 300mg  QHS for her leg pains.

## 2013-06-17 NOTE — Assessment & Plan Note (Signed)
Impaired fasting glucose during hospital stay, likely secondary to steroid use, however patient does complain of symptoms suggestive of neuropathy. Will obtain a HgbA1C to screen for diabetes.

## 2013-06-17 NOTE — Assessment & Plan Note (Addendum)
Diagnosed during hospitalization, patient reports trouble affording ensure supplements.  Patient instructed to try to contact health department to see if she will quality for any benefits.

## 2013-06-17 NOTE — Assessment & Plan Note (Addendum)
Patient has finished Prednisone and Doxycycline course. Uses albuterol and atrovent nebs at home along with Advair.  Currently denies SOB however O2 sat is 85% at rest today, patient is on home O2 2-3L however patient did not bring portable O2 with her today.  She reports difficulty with her O2 tanks and cannot return them, she needs alternative and [employer] to collect empty tanks.

## 2013-06-17 NOTE — Assessment & Plan Note (Addendum)
BP Readings from Last 3 Encounters:  06/17/13 172/85  06/12/13 157/71  05/30/13 153/90    Lab Results  Component Value Date   NA 141 06/12/2013   K 4.6 06/12/2013   CREATININE 0.55 06/12/2013    Assessment: Blood pressure control: moderately elevated Progress toward BP goal:  deteriorated Comments: Patient has had elevated BP at many of her previous visits.  Plan: Medications:  Continue HCTZ 25mg  QD, Will add Losartan 50mg  QD Educational resources provided: other (see comments) Self management tools provided:   Other plans: BP remains elevated, Losartan added, will follow up in 1 month.

## 2013-06-17 NOTE — Assessment & Plan Note (Signed)
Patient reports depression, anhedonia, lack of concentration, decreased energy, and decreased sleep.  She reports she has contemplated suicide in the past but never had a plan, no current SI. -Will start patient on Wellbutrin as this may also help patient quit smoking.

## 2013-06-17 NOTE — Patient Instructions (Addendum)
1.  Start taking Wellbutrin 100mg  twice a day for 3 days then 3 times a day. 2.  Start taking Losartan 50mg  a day for blood pressure. 3.  Start taking Gabapentin 300mg  at night for leg pains.  Bupropion tablets (Depression/Mood Disorders) What is this medicine? BUPROPION (byoo PROE pee on) is used to treat depression. This medicine may be used for other purposes; ask your health care provider or pharmacist if you have questions. What should I tell my health care provider before I take this medicine? They need to know if you have any of these conditions: -an eating disorder, such as anorexia or bulimia -bipolar disorder or psychosis -diabetes or high blood sugar, treated with medication -heart disease, previous heart attack, or irregular heart beat -head injury or brain tumor -high blood pressure -kidney or liver disease -seizures -suicidal thoughts or a previous suicide attempt -Tourette's syndrome -weight loss -an unusual or allergic reaction to bupropion, other medicines, foods, dyes, or preservatives -breast-feeding -pregnant or trying to become pregnant How should I use this medicine? Take this medicine by mouth with a [place] of water. Follow the directions on the prescription label. You can take it with or without food. If it upsets your stomach, take it with food. Take your medicine at regular intervals. Do not take your medicine more often than directed. Do not stop taking this medicine suddenly except upon the advice of your doctor. Stopping this medicine too quickly may cause serious side effects or your condition may worsen. A special MedGuide will be given to you by the pharmacist with each prescription and refill. Be sure to read this information carefully each time. Talk to your pediatrician regarding the use of this medicine in children. Special care may be needed. Overdosage: If you think you have taken too much of this medicine contact a poison control center or emergency  room at once. NOTE: This medicine is only for you. Do not share this medicine with others. What if I miss a dose? If you miss a dose, take it as soon as you can. If it is less than four hours to your next dose, take only that dose and skip the missed dose. Do not take double or extra doses. What may interact with this medicine? Do not take this medicine with any of the following medications: -linezolid -MAOIs like Azilect, Carbex, Eldepryl, Marplan, Nardil, and Parnate -methylene blue (injected into a vein) -other medicines that contain bupropion like Zyban This medicine may also interact with the following medications: -alcohol -certain medicines for anxiety or sleep -certain medicines for blood pressure like metoprolol, propranolol -certain medicines for depression or psychotic disturbances -certain medicines for HIV or AIDS like efavirenz, lopinavir, nelfinavir, ritonavir -certain medicines for irregular heart beat like propafenone, flecainide -certain medicines for Parkinson's disease like amantadine, levodopa -certain medicines for seizures like carbamazepine, phenytoin, phenobarbital -cimetidine -clopidogrel -cyclophosphamide -furazolidone -isoniazid -nicotine -orphenadrine -procarbazine -steroid medicines like prednisone or cortisone -stimulant medicines for attention disorders, weight loss, or to stay awake -tamoxifen -theophylline -thiotepa -ticlopidine -tramadol -warfarin This list may not describe all possible interactions. Give your health care provider a list of all the medicines, herbs, non-prescription drugs, or dietary supplements you use. Also tell them if you smoke, drink alcohol, or use illegal drugs. Some items may interact with your medicine. What should I watch for while using this medicine? Tell your doctor if your symptoms do not get better or if they get worse. Visit your doctor or health care professional for regular checks  on your progress. Because it  may take several weeks to see the full effects of this medicine, it is important to continue your treatment as prescribed by your doctor. Patients and their families should watch out for new or worsening thoughts of suicide or depression. Also watch out for sudden changes in feelings such as feeling anxious, agitated, panicky, irritable, hostile, aggressive, impulsive, severely restless, overly excited and hyperactive, or not being able to sleep. If this happens, especially at the beginning of treatment or after a change in dose, call your health care professional. Avoid alcoholic drinks while taking this medicine. Drinking excessive alcoholic beverages, using sleeping or anxiety medicines, or quickly stopping the use of these agents while taking this medicine may increase your risk for a seizure. Do not drive or use heavy machinery until you know how this medicine affects you. This medicine can impair your ability to perform these tasks. Do not take this medicine close to bedtime. It may prevent you from sleeping. Your mouth may get dry. Chewing sugarless gum or sucking hard candy, and drinking plenty of water may help. Contact your doctor if the problem does not go away or is severe. What side effects may I notice from receiving this medicine? Side effects that you should report to your doctor or health care professional as soon as possible: -allergic reactions like skin rash, itching or hives, swelling of the face, lips, or tongue -breathing problems -changes in vision -confusion -fast or irregular heartbeat -hallucinations -increased blood pressure -redness, blistering, peeling or loosening of the skin, including inside the mouth -seizures -suicidal thoughts or other mood changes -unusually weak or tired -vomiting Side effects that usually do not require medical attention (report to your doctor or health care professional if they continue or are bothersome): -change in sex drive or  performance -constipation -headache -loss of appetite -nausea -tremors -weight loss This list may not describe all possible side effects. Call your doctor for medical advice about side effects. You may report side effects to FDA at 1-800-FDA-1088. Where should I keep my medicine? Keep out of the reach of children. Store at room temperature between 15 and 25 degrees C (59 and 77 degrees F), away from direct sunlight and moisture. Keep tightly closed. Throw away any unused medicine after the expiration date. NOTE: This sheet is a summary. It may not cover all possible information. If you have questions about this medicine, talk to your doctor, pharmacist, or health care provider.  2013, Elsevier/Gold Standard. (02/06/2012 2:05:29 PM)   Losartan tablets What is this medicine? LOSARTAN (loe SAR tan) is used to treat high blood pressure and to reduce the risk of stroke in certain patients. This drug also slows the progression of kidney disease in patients with diabetes. This medicine may be used for other purposes; ask your health care provider or pharmacist if you have questions. What should I tell my health care provider before I take this medicine? They need to know if you have any of these conditions: -heart failure -kidney or liver disease -an unusual or allergic reaction to losartan, other medicines, foods, dyes, or preservatives -pregnant or trying to get pregnant -breast-feeding How should I use this medicine? Take this medicine by mouth with a [place] of water. Follow the directions on the prescription label. This medicine can be taken with or without food. Take your doses at regular intervals. Do not take your medicine more often than directed. Talk to your pediatrician regarding the use of this medicine in children.  Special care may be needed. Overdosage: If you think you have taken too much of this medicine contact a poison control center or emergency room at once. NOTE: This  medicine is only for you. Do not share this medicine with others. What if I miss a dose? If you miss a dose, take it as soon as you can. If it is almost time for your next dose, take only that dose. Do not take double or extra doses. What may interact with this medicine? -blood pressure medicines -diuretics, especially triamterene, spironolactone, or amiloride -fluconazole -NSAIDs, medicines for pain and inflammation, like ibuprofen or naproxen -potassium salts or potassium supplements -rifampin This list may not describe all possible interactions. Give your health care provider a list of all the medicines, herbs, non-prescription drugs, or dietary supplements you use. Also tell them if you smoke, drink alcohol, or use illegal drugs. Some items may interact with your medicine. What should I watch for while using this medicine? Visit your doctor or health care professional for regular checks on your progress. Check your blood pressure as directed. Ask your doctor or health care professional what your blood pressure should be and when you should contact him or her. Call your doctor or health care professional if you notice an irregular or fast heart beat. Women should inform their doctor if they wish to become pregnant or think they might be pregnant. There is a potential for serious side effects to an unborn child, particularly in the second or third trimester. Talk to your health care professional or pharmacist for more information. You may get drowsy or dizzy. Do not drive, use machinery, or do anything that needs mental alertness until you know how this drug affects you. Do not stand or sit up quickly, especially if you are an older patient. This reduces the risk of dizzy or fainting spells. Alcohol can make you more drowsy and dizzy. Avoid alcoholic drinks. Avoid salt substitutes unless you are told otherwise by your doctor or health care professional. Do not treat yourself for coughs, colds, or  pain while you are taking this medicine without asking your doctor or health care professional for advice. Some ingredients may increase your blood pressure. What side effects may I notice from receiving this medicine? Side effects that you should report to your doctor or health care professional as soon as possible: -confusion, dizziness, light headedness or fainting spells -decreased amount of urine passed -difficulty breathing or swallowing, hoarseness, or tightening of the throat -fast or irregular heart beat, palpitations, or chest pain -skin rash, itching -swelling of your face, lips, tongue, hands, or feet Side effects that usually do not require medical attention (report to your doctor or health care professional if they continue or are bothersome): -cough -decreased sexual function or desire -headache -nasal congestion or stuffiness -nausea or stomach pain -sore or cramping muscles This list may not describe all possible side effects. Call your doctor for medical advice about side effects. You may report side effects to FDA at 1-800-FDA-1088. Where should I keep my medicine? Keep out of the reach of children. Store at room temperature between 15 and 30 degrees C (59 and 86 degrees F). Protect from light. Keep container tightly closed. Throw away any unused medicine after the expiration date. NOTE: This sheet is a summary. It may not cover all possible information. If you have questions about this medicine, talk to your doctor, pharmacist, or health care provider.  2012, Elsevier/Gold Standard. (11/30/2007 4:42:18 PM)  Gabapentin capsules  or tablets What is this medicine? GABAPENTIN (GA ba pen tin) is used to control partial seizures in adults with epilepsy. It is also used to treat certain types of nerve pain. This medicine may be used for other purposes; ask your health care provider or pharmacist if you have questions. What should I tell my health care provider before I take  this medicine? They need to know if you have any of these conditions: -kidney disease -suicidal thoughts, plans, or attempt; a previous suicide attempt by you or a family member -an unusual or allergic reaction to gabapentin, other medicines, foods, dyes, or preservatives -pregnant or trying to get pregnant -breast-feeding How should I use this medicine? Take this medicine by mouth. Swallow it with a drink of water. Follow the directions on the prescription label. If this medicine upsets your stomach, take it with food or milk. Take your medicine at regular intervals. Do not take it more often than directed. If you are directed to break the 600 or 800 mg tablets in half as part of your dose, the extra half tablet should be used for the next dose. If you have not used the extra half tablet within 3 days, it should be thrown away. A special MedGuide will be given to you by the pharmacist with each prescription and refill. Be sure to read this information carefully each time. Talk to your pediatrician regarding the use of this medicine in children. Special care may be needed. Overdosage: If you think you have taken too much of this medicine contact a poison control center or emergency room at once. NOTE: This medicine is only for you. Do not share this medicine with others. What if I miss a dose? If you miss a dose, take it as soon as you can. If it is almost time for your next dose, take only that dose. Do not take double or extra doses. What may interact with this medicine? -antacids -hydrocodone -morphine -naproxen -sevelamer This list may not describe all possible interactions. Give your health care provider a list of all the medicines, herbs, non-prescription drugs, or dietary supplements you use. Also tell them if you smoke, drink alcohol, or use illegal drugs. Some items may interact with your medicine. What should I watch for while using this medicine? Visit your doctor or health care  professional for regular checks on your progress. You may want to keep a record at home of how you feel your condition is responding to treatment. You may want to share this information with your doctor or health care professional at each visit. You should contact your doctor or health care professional if your seizures get worse or if you have any new types of seizures. Do not stop taking this medicine or any of your seizure medicines unless instructed by your doctor or health care professional. Stopping your medicine suddenly can increase your seizures or their severity. Wear a medical identification bracelet or chain if you are taking this medicine for seizures, and carry a card that lists all your medications. You may get drowsy, dizzy, or have blurred vision. Do not drive, use machinery, or do anything that needs mental alertness until you know how this medicine affects you. To reduce dizzy or fainting spells, do not sit or stand up quickly, especially if you are an older patient. Alcohol can increase drowsiness and dizziness. Avoid alcoholic drinks. Your mouth may get dry. Chewing sugarless gum or sucking hard candy, and drinking plenty of water will help. The  use of this medicine may increase the chance of suicidal thoughts or actions. Pay special attention to how you are responding while on this medicine. Any worsening of mood, or thoughts of suicide or dying should be reported to your health care professional right away. Women who become pregnant while using this medicine may enroll in the [place] Antiepileptic Drug Pregnancy Registry by calling [phone]. This registry collects information about the safety of antiepileptic drug use during pregnancy. What side effects may I notice from receiving this medicine? Side effects that you should report to your doctor or health care professional as soon as possible: -allergic reactions like skin rash, itching or hives, swelling of the face,  lips, or tongue -worsening of mood, thoughts or actions of suicide or dying Side effects that usually do not require medical attention (report to your doctor or health care professional if they continue or are bothersome): -constipation -difficulty walking or controlling muscle movements -nausea -slurred speech -tremors -weight gain This list may not describe all possible side effects. Call your doctor for medical advice about side effects. You may report side effects to FDA at 1-800-FDA-1088. Where should I keep my medicine? Keep out of reach of children. Store at room temperature between 15 and 30 degrees C (59 and 86 degrees F). Throw away any unused medicine after the expiration date. NOTE: This sheet is a summary. It may not cover all possible information. If you have questions about this medicine, talk to your doctor, pharmacist, or health care provider.  2012, Elsevier/Gold Standard. (05/18/2010 6:06:26 PM)

## 2013-06-17 NOTE — Progress Notes (Signed)
Patient ID: Karina Weber, female   DOB: December 24, 1955, 57 y.o.   MRN: [id number]   Subjective:   Patient ID: Karina Weber female   DOB: [birth year] 57 y.o.   MRN: [id number]  HPI: Karina Weber is a 57 y.o. female with a PMH of severe COPD, chronic respiratory failure, non-small cell lung carcinoma, HTN, she presents today for hospital followup. She is completed her course of prednisone and doxycycline. She is on home O2 at a rate of 2-3 L. She presents today without her ambulatory O2, she is upset that her empty O2 canisters have not been collected by [facility]. And she does not give a clear answer to why she does not have ambulatory O2.  She reports her shortness of breath is much improved and her report she is doing very well. She admits she continues to smoke and reports she would like to quit however her anxiety causes her to occasionally smoke heavily. In addition she admits she is depressed, has decreased energy, decreased concentration, and decreased ability to fall asleep. She reports she has been taking all her medications as prescribed. She does not check her BP at home.    Past Medical History  Diagnosis Date  . Tobacco abuse   . COPD (chronic obstructive pulmonary disease)     exacerbagtion in 6/12 and in 2010.   Marland Kitchen Hypertension   . History of radiation therapy 07/19/10 to 07/28/10    RUL lung  . 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 Alcohol abuse     2 beers a day  . Asthma   . Headache(784.0)   . Chronic hypercapnic respiratory failure   . Polysubstance abuse    Current Outpatient Prescriptions  Medication Sig Dispense Refill  . albuterol (PROVENTIL) (5 MG/ML) 0.5% nebulizer solution Take 0.5 mLs (2.5 mg total) by nebulization every 6 (six) hours as needed for wheezing or shortness of breath.  20 mL  12  . albuterol (VENTOLIN HFA) 108 (90 BASE) MCG/ACT inhaler Inhale 2 puffs into the lungs every 4 (four) hours as needed for wheezing or shortness of  breath.  18 g  6  . aspirin EC 81 MG tablet Take 81 mg by mouth daily.      Marland Kitchen doxycycline (VIBRA-TABS) 100 MG tablet Take 1 tablet (100 mg total) by mouth every 12 (twelve) hours.  9 tablet  0  . Fluticasone-Salmeterol (ADVAIR) 500-50 MCG/DOSE AEPB Inhale 1 puff into the lungs every 12 (twelve) hours.  60 each  3  . hydrochlorothiazide (HYDRODIURIL) 25 MG tablet Take 1 tablet (25 mg total) by mouth daily.  30 tablet  6  . ipratropium (ATROVENT) 0.02 % nebulizer solution Take 2.5 mLs (0.5 mg total) by nebulization every 6 (six) hours.  75 mL  3  . predniSONE (DELTASONE) 20 MG tablet Take 2 tablets (40 mg total) by mouth daily with breakfast.  8 tablet  0  . [DISCONTINUED] ferrous gluconate (FERGON) 325 MG tablet Take 325 mg by mouth 3 (three) times daily with meals.        . [DISCONTINUED] losartan (COZAAR) 100 MG tablet Take 1 tablet (100 mg total) by mouth daily.  31 tablet  11   No current facility-administered medications for this visit.   Family History  Problem Relation Age of Onset  . Heart disease Neg Hx   . Cancer Mother     lung  . Cancer Father     lung  .  Hypertension     History   Social History  . Marital Status: Single    Spouse Name: N/A    Number of Children: N/A  . Years of Education: N/A   Social History Main Topics  . Smoking status: Current Some Day Smoker -- 0.20 packs/day for 40 years    Types: Cigarettes  . Smokeless tobacco: Never Used     Comment: PATIENT STATE SHE ONLY SMOKES ONLY 3 CIGARETRTES A DAYCut back from 2 ppd when diagnosed with Lung cancer.12/02/11 > 3 cig/day 07/20/12  . Alcohol Use: No     Comment: Former alcoholic  . Drug Use: Yes    Special: Cocaine, Marijuana  . Sexual Activity: Not on file   Other Topics Concern  . Not on file   Social History Narrative   Lives with a roommate (mutual grandmother to grandchildren). She takes care of herself, does not have any assistance when she is sick. He is able to perform ADLs when she is not  sick . Used to work in [employer], retired 2 years ago . Education: 9th grade . SHe on Medicaid now.       Smokes 1 pack per day (since age 64 y.o)   Drinks 4 cans of beer every day or week.    Denies drugs though UDS multiple admissions is positive.        As of 01/16/13 still smoking 1 ppd. Smoking since age 32. Drinks 1-4 cans of beer 12 oz cans though patient will not say how frequently throughout the week   Review of Systems: Review of Systems  Constitutional: Negative for fever, chills, weight loss and malaise/fatigue.  HENT: Negative for congestion.   Eyes: Negative for blurred vision.  Respiratory: Positive for shortness of breath. Negative for cough, sputum production and wheezing.   Cardiovascular: Negative for chest pain, palpitations and leg swelling.  Gastrointestinal: Negative for heartburn, nausea, vomiting, abdominal pain, diarrhea and constipation.  Genitourinary: Negative for dysuria.  Skin: Negative for rash.  Neurological: Negative for dizziness and headaches.  Psychiatric/Behavioral: Positive for depression. Negative for suicidal ideas. The patient has insomnia.     Objective:  Physical Exam: Filed Vitals:   06/17/13 1508  BP: 172/85  Pulse: 85  Temp: 98 F (36.7 C)  TempSrc: Oral  Height: 5' (1.524 m)  Weight: 97 lb 12.8 oz (44.362 kg)  SpO2: 85%  Physical Exam  Nursing note and vitals reviewed. Constitutional: She is well-developed, well-nourished, and in no distress. No distress.  HENT:  Head: Normocephalic and atraumatic.  Eyes: Conjunctivae are normal.  Cardiovascular: Normal rate, regular rhythm, normal heart sounds and intact distal pulses.   No murmur heard. Pulmonary/Chest: Effort normal. No respiratory distress. She has no wheezes. She has no rales.  Prolonged expiratory phase, Talking in full sentences   Abdominal: Soft. Bowel sounds are normal. She exhibits no distension. There is no tenderness.  Musculoskeletal: She exhibits no edema.   Skin: Skin is warm and dry. She is not diaphoretic.  Psychiatric: Affect and judgment normal.     Assessment & Plan:   See Problem Based Assessment and Plan

## 2013-06-17 NOTE — Assessment & Plan Note (Signed)
  Assessment: Progress toward smoking cessation:  smoking the same amount Barriers to progress toward smoking cessation:  lack of motivation to quit;stress Comments:   Plan: Instruction/counseling given:  I counseled patient on the dangers of tobacco use, advised patient to stop smoking, and reviewed strategies to maximize success. Educational resources provided:  [facility] (1-800-QUIT-NOW) brochure;smoking cessation Manufacturing systems engineer tools provided:  smoking cessation plan (STAR Quit Plan) Medications to assist with smoking cessation:  Bupropion (Zyban) Patient agreed to the following self-care plans for smoking cessation: cut down the number of cigarettes smoked  Other plans: Starting Bupropion for cocominant depression and encouraged cessation.

## 2013-06-18 NOTE — Progress Notes (Signed)
I saw and evaluated the patient.  I personally confirmed the key portions of the history and exam documented by Dr. Hoffman and I reviewed pertinent patient test results.  The assessment, diagnosis, and plan were formulated together and I agree with the documentation in the resident's note. 

## 2013-07-01 ENCOUNTER — Encounter (HOSPITAL_COMMUNITY): Payer: Medicare Other

## 2013-07-01 ENCOUNTER — Encounter: Payer: Self-pay | Admitting: Radiation Oncology

## 2013-07-02 ENCOUNTER — Telehealth: Payer: Self-pay | Admitting: *Deleted

## 2013-07-02 NOTE — Telephone Encounter (Signed)
Called patient to rescheduled missed test for 07-01-13, lvm for a return call

## 2013-07-03 ENCOUNTER — Ambulatory Visit: Admission: RE | Admit: 2013-07-03 | Payer: Medicare Other | Source: Ambulatory Visit | Admitting: Radiation Oncology

## 2013-07-03 HISTORY — DX: Edema, unspecified: R60.9

## 2013-07-03 HISTORY — DX: Dependence on supplemental oxygen: Z99.81

## 2013-07-29 ENCOUNTER — Other Ambulatory Visit: Payer: Self-pay | Admitting: *Deleted

## 2013-07-29 MED ORDER — ALBUTEROL SULFATE HFA 108 (90 BASE) MCG/ACT IN AERS: 2.0000 | INHALATION_SPRAY | RESPIRATORY_TRACT | Status: DC | PRN

## 2013-08-01 ENCOUNTER — Telehealth: Payer: Self-pay | Admitting: *Deleted

## 2013-08-01 NOTE — Telephone Encounter (Signed)
Called son Jaynie Collins [phone] left voice message that the number he gave Darryl Nestle [phone] was a wrong number told to her, and that she triied calling patient list of friend Alinda Money and ernie, no answers, pleas get in touch wiith your mom and have her call us, her appt times has been mailed to her at address in Epic 1:52 PM

## 2013-08-01 NOTE — Telephone Encounter (Signed)
xxxxx 

## 2013-08-01 NOTE — Telephone Encounter (Signed)
CALLED PATIENT TO INFORM OF TEST, LVM FOR A RETURN CALL, MAILED APPT. CARD

## 2013-08-01 NOTE — Telephone Encounter (Signed)
CALLED PATIENT TO ASK QUESTION, LVM FOR  A RETURN CAL 

## 2013-08-01 NOTE — Telephone Encounter (Signed)
Message copied by Lacy Duverney on Thu Aug 01, 2013  1:49 PM ------      Message from: Darryl Nestle R      Created: Thu Aug 01, 2013  1:39 PM      Regarding: TEST       Hi Dr. Mitzi Hansen,             I have rescheduled Karina Weber' Pet for 08-09-13 at 12:00 pm at [facility], I have called her and left a message, she doesn't return my phone calls, I have mailed her an appt. Card with the test info. On it.  I have spoke with her son and he gave me another phone number [phone], I called this number and the lady told me that I have a wrong number.  I am open to suggestions at this point.  What do you think?  Let me know.            Thanks,       Talbert Forest ------

## 2013-08-09 ENCOUNTER — Encounter (HOSPITAL_COMMUNITY): Payer: Medicare Other

## 2013-09-05 ENCOUNTER — Encounter: Payer: Medicare Other | Admitting: Internal Medicine

## 2013-09-10 ENCOUNTER — Inpatient Hospital Stay (HOSPITAL_COMMUNITY)
Admission: EM | Admit: 2013-09-10 | Discharge: 2013-09-14 | DRG: 189 | Disposition: A | Discharge status: Home / Self Care | Payer: Medicare Other | Attending: Critical Care Medicine | Admitting: Critical Care Medicine

## 2013-09-10 ENCOUNTER — Emergency Department (HOSPITAL_COMMUNITY): Payer: Medicare Other

## 2013-09-10 ENCOUNTER — Encounter (HOSPITAL_COMMUNITY): Payer: Self-pay | Admitting: Emergency Medicine

## 2013-09-10 DIAGNOSIS — C3410 Malignant neoplasm of upper lobe, unspecified bronchus or lung: Secondary | ICD-10-CM | POA: Diagnosis present

## 2013-09-10 DIAGNOSIS — J441 Chronic obstructive pulmonary disease with (acute) exacerbation: Secondary | ICD-10-CM | POA: Diagnosis present

## 2013-09-10 DIAGNOSIS — J9612 Chronic respiratory failure with hypercapnia: Secondary | ICD-10-CM

## 2013-09-10 DIAGNOSIS — J9620 Acute and chronic respiratory failure, unspecified whether with hypoxia or hypercapnia: Principal | ICD-10-CM | POA: Diagnosis present

## 2013-09-10 DIAGNOSIS — J9600 Acute respiratory failure, unspecified whether with hypoxia or hypercapnia: Secondary | ICD-10-CM

## 2013-09-10 DIAGNOSIS — E872 Acidosis, unspecified: Secondary | ICD-10-CM | POA: Diagnosis present

## 2013-09-10 DIAGNOSIS — F1910 Other psychoactive substance abuse, uncomplicated: Secondary | ICD-10-CM | POA: Diagnosis present

## 2013-09-10 DIAGNOSIS — E871 Hypo-osmolality and hyponatremia: Secondary | ICD-10-CM | POA: Diagnosis present

## 2013-09-10 DIAGNOSIS — F17200 Nicotine dependence, unspecified, uncomplicated: Secondary | ICD-10-CM | POA: Diagnosis present

## 2013-09-10 DIAGNOSIS — I10 Essential (primary) hypertension: Secondary | ICD-10-CM | POA: Diagnosis present

## 2013-09-10 DIAGNOSIS — Z681 Body mass index (BMI) 19 or less, adult: Secondary | ICD-10-CM

## 2013-09-10 DIAGNOSIS — R0602 Shortness of breath: Secondary | ICD-10-CM

## 2013-09-10 DIAGNOSIS — E875 Hyperkalemia: Secondary | ICD-10-CM | POA: Diagnosis present

## 2013-09-10 DIAGNOSIS — E46 Unspecified protein-calorie malnutrition: Secondary | ICD-10-CM | POA: Diagnosis present

## 2013-09-10 DIAGNOSIS — Z9981 Dependence on supplemental oxygen: Secondary | ICD-10-CM

## 2013-09-10 DIAGNOSIS — F1020 Alcohol dependence, uncomplicated: Secondary | ICD-10-CM | POA: Diagnosis present

## 2013-09-10 DIAGNOSIS — F1210 Cannabis abuse, uncomplicated: Secondary | ICD-10-CM | POA: Diagnosis present

## 2013-09-10 DIAGNOSIS — R0600 Dyspnea, unspecified: Secondary | ICD-10-CM

## 2013-09-10 DIAGNOSIS — C3490 Malignant neoplasm of unspecified part of unspecified bronchus or lung: Secondary | ICD-10-CM | POA: Diagnosis present

## 2013-09-10 DIAGNOSIS — F329 Major depressive disorder, single episode, unspecified: Secondary | ICD-10-CM | POA: Diagnosis present

## 2013-09-10 DIAGNOSIS — G9340 Encephalopathy, unspecified: Secondary | ICD-10-CM | POA: Diagnosis present

## 2013-09-10 DIAGNOSIS — F1410 Cocaine abuse, uncomplicated: Secondary | ICD-10-CM | POA: Diagnosis present

## 2013-09-10 DIAGNOSIS — I4891 Unspecified atrial fibrillation: Secondary | ICD-10-CM | POA: Diagnosis present

## 2013-09-10 LAB — BASIC METABOLIC PANEL
BUN: 14 mg/dL (ref 6–23)
CO2: 30 mEq/L (ref 19–32)
Chloride: 94 mEq/L — ABNORMAL LOW (ref 96–112)
GFR calc Af Amer: >90 mL/min (ref >90)
Glucose, Bld: 106 mg/dL — ABNORMAL HIGH (ref 70–99)
Potassium: 5.4 mEq/L — ABNORMAL HIGH (ref 3.5–5.1)

## 2013-09-10 LAB — COMPREHENSIVE METABOLIC PANEL
ALT: 23 U/L (ref 0–35)
AST: 41 U/L — ABNORMAL HIGH (ref 0–37)
Albumin: 3.5 g/dL (ref 3.5–5.2)
Alkaline Phosphatase: 186 U/L — ABNORMAL HIGH (ref 39–117)
Calcium: 9.3 mg/dL (ref 8.4–10.5)
GFR calc Af Amer: >90 mL/min (ref >90)
Potassium: 5.9 mEq/L — ABNORMAL HIGH (ref 3.5–5.1)
Sodium: 129 mEq/L — ABNORMAL LOW (ref 135–145)
Total Protein: 7.7 g/dL (ref 6.0–8.3)

## 2013-09-10 LAB — CBC WITH DIFFERENTIAL/PLATELET
Basophils Absolute: 0 10*3/uL (ref 0.0–0.1)
Basophils Relative: 0 % (ref 0–1)
Eosinophils Absolute: 0 10*3/uL (ref 0.0–0.7)
Eosinophils Relative: 0 % (ref 0–5)
MCH: 29.7 pg (ref 26.0–34.0)
MCV: 92.9 fL (ref 78.0–100.0)
Neutrophils Relative %: 74 % (ref 43–77)
Platelets: 210 10*3/uL (ref 150–400)
RBC: 5.05 MIL/uL (ref 3.87–5.11)
RDW: 19 % — ABNORMAL HIGH (ref 11.5–15.5)
WBC: 7.9 10*3/uL (ref 4.0–10.5)

## 2013-09-10 LAB — POCT I-STAT 3, ART BLOOD GAS (G3+)
Bicarbonate: 29.3 mEq/L — ABNORMAL HIGH (ref 20.0–24.0)
Patient temperature: 99.1
TCO2: 32 mmol/L (ref 0–100)
pCO2 arterial: 78.1 mmHg (ref 35.0–45.0)
pH, Arterial: 7.185 — CL (ref 7.350–7.450)
pO2, Arterial: 57 mmHg — ABNORMAL LOW (ref 80.0–100.0)

## 2013-09-10 LAB — PRO B NATRIURETIC PEPTIDE: Pro B Natriuretic peptide (BNP): 3402 pg/mL — ABNORMAL HIGH (ref 0–125)

## 2013-09-10 MED ORDER — SODIUM CHLORIDE 0.9 % IJ SOLN
3.0000 mL | Freq: Two times a day (BID) | INTRAMUSCULAR | Status: DC
  Administered 2013-09-10 – 2013-09-14 (×5): 3 mL via INTRAVENOUS

## 2013-09-10 MED ORDER — GABAPENTIN 300 MG PO CAPS
300.0000 mg | ORAL_CAPSULE | Freq: Every day | ORAL | Status: DC
  Administered 2013-09-10 – 2013-09-13 (×4): 300 mg via ORAL
  Filled 2013-09-10 (×5): qty 1

## 2013-09-10 MED ORDER — METHYLPREDNISOLONE SODIUM SUCC 125 MG IJ SOLR
125.0000 mg | INTRAMUSCULAR | Status: AC
  Administered 2013-09-10: 125 mg via INTRAVENOUS
  Filled 2013-09-10: qty 2

## 2013-09-10 MED ORDER — SODIUM CHLORIDE 0.9 % IV SOLN: 2.0000 g | Freq: Once | INTRAVENOUS | Status: DC

## 2013-09-10 MED ORDER — GUAIFENESIN ER 600 MG PO TB12
600.0000 mg | ORAL_TABLET | Freq: Two times a day (BID) | ORAL | Status: DC
  Administered 2013-09-10: 600 mg via ORAL
  Filled 2013-09-10 (×3): qty 1

## 2013-09-10 MED ORDER — ENOXAPARIN SODIUM 30 MG/0.3ML ~~LOC~~ SOLN
30.0000 mg | SUBCUTANEOUS | Status: DC
  Administered 2013-09-11 – 2013-09-13 (×3): 30 mg via SUBCUTANEOUS
  Filled 2013-09-10 (×5): qty 0.3

## 2013-09-10 MED ORDER — FUROSEMIDE 10 MG/ML IJ SOLN
20.0000 mg | Freq: Once | INTRAMUSCULAR | Status: AC
  Administered 2013-09-10: 20 mg via INTRAVENOUS
  Filled 2013-09-10: qty 2

## 2013-09-10 MED ORDER — PREDNISONE 50 MG PO TABS
50.0000 mg | ORAL_TABLET | Freq: Every day | ORAL | Status: DC
  Administered 2013-09-10 – 2013-09-12 (×2): 50 mg via ORAL
  Filled 2013-09-10 (×3): qty 1

## 2013-09-10 MED ORDER — IPRATROPIUM BROMIDE 0.02 % IN SOLN
RESPIRATORY_TRACT | Status: AC
  Filled 2013-09-10: qty 2.5

## 2013-09-10 MED ORDER — ALBUTEROL SULFATE (5 MG/ML) 0.5% IN NEBU: 2.5000 mg | INHALATION_SOLUTION | RESPIRATORY_TRACT | Status: DC | PRN

## 2013-09-10 MED ORDER — AMLODIPINE BESYLATE 10 MG PO TABS
10.0000 mg | ORAL_TABLET | Freq: Every day | ORAL | Status: DC
  Administered 2013-09-10 – 2013-09-14 (×5): 10 mg via ORAL
  Filled 2013-09-10 (×6): qty 1

## 2013-09-10 MED ORDER — DOXYCYCLINE HYCLATE 100 MG PO TABS
100.0000 mg | ORAL_TABLET | Freq: Two times a day (BID) | ORAL | Status: DC
  Administered 2013-09-10 – 2013-09-12 (×4): 100 mg via ORAL
  Filled 2013-09-10 (×6): qty 1

## 2013-09-10 MED ORDER — SODIUM CHLORIDE 0.9 % IJ SOLN
3.0000 mL | INTRAMUSCULAR | Status: DC | PRN
  Administered 2013-09-13: 3 mL via INTRAVENOUS

## 2013-09-10 MED ORDER — BUPROPION HCL 100 MG PO TABS
100.0000 mg | ORAL_TABLET | Freq: Three times a day (TID) | ORAL | Status: DC
  Administered 2013-09-10 – 2013-09-14 (×11): 100 mg via ORAL
  Filled 2013-09-10 (×14): qty 1

## 2013-09-10 MED ORDER — ASPIRIN EC 81 MG PO TBEC
81.0000 mg | DELAYED_RELEASE_TABLET | Freq: Every day | ORAL | Status: DC
  Administered 2013-09-11 – 2013-09-14 (×4): 81 mg via ORAL
  Filled 2013-09-10 (×5): qty 1

## 2013-09-10 MED ORDER — SODIUM CHLORIDE 0.9 % IV SOLN
INTRAVENOUS | Status: DC
  Administered 2013-09-10: 20 mL/h via INTRAVENOUS
  Administered 2013-09-14: 01:00:00 via INTRAVENOUS

## 2013-09-10 MED ORDER — IPRATROPIUM BROMIDE 0.02 % IN SOLN
0.5000 mg | Freq: Four times a day (QID) | RESPIRATORY_TRACT | Status: DC
  Administered 2013-09-10 – 2013-09-14 (×13): 0.5 mg via RESPIRATORY_TRACT
  Filled 2013-09-10 (×12): qty 2.5

## 2013-09-10 MED ORDER — SODIUM CHLORIDE 0.9 % IJ SOLN
3.0000 mL | Freq: Two times a day (BID) | INTRAMUSCULAR | Status: DC
  Administered 2013-09-10 – 2013-09-12 (×4): 3 mL via INTRAVENOUS

## 2013-09-10 MED ORDER — DEXTROSE 5 % IV SOLN
1.0000 g | INTRAVENOUS | Status: DC
  Administered 2013-09-11 – 2013-09-13 (×3): 1 g via INTRAVENOUS
  Filled 2013-09-10 (×3): qty 10

## 2013-09-10 MED ORDER — LEVALBUTEROL HCL 0.63 MG/3ML IN NEBU
0.6300 mg | INHALATION_SOLUTION | Freq: Four times a day (QID) | RESPIRATORY_TRACT | Status: DC
  Administered 2013-09-10 – 2013-09-12 (×6): 0.63 mg via RESPIRATORY_TRACT
  Filled 2013-09-10 (×15): qty 3

## 2013-09-10 MED ORDER — HYDRALAZINE HCL 20 MG/ML IJ SOLN
10.0000 mg | INTRAMUSCULAR | Status: AC
  Administered 2013-09-10: 10 mg via INTRAVENOUS
  Filled 2013-09-10: qty 1

## 2013-09-10 MED ORDER — BIOTENE DRY MOUTH MT LIQD
15.0000 mL | Freq: Two times a day (BID) | OROMUCOSAL | Status: DC
  Administered 2013-09-10 – 2013-09-14 (×7): 15 mL via OROMUCOSAL

## 2013-09-10 MED ORDER — SODIUM CHLORIDE 0.9 % IV SOLN
2.0000 g | Freq: Once | INTRAVENOUS | Status: AC
  Administered 2013-09-10: 2 g via INTRAVENOUS
  Filled 2013-09-10: qty 20

## 2013-09-10 MED ORDER — HYDROCHLOROTHIAZIDE 25 MG PO TABS
25.0000 mg | ORAL_TABLET | Freq: Every day | ORAL | Status: DC
  Administered 2013-09-11 – 2013-09-14 (×4): 25 mg via ORAL
  Filled 2013-09-10 (×5): qty 1

## 2013-09-10 MED ORDER — SODIUM CHLORIDE 0.9 % IV SOLN: 250.0000 mL | INTRAVENOUS | Status: DC | PRN

## 2013-09-10 NOTE — Consult Note (Signed)
PULMONARY  / CRITICAL CARE MEDICINE  Name: Karina Weber MRN: [id number] DOB: September 25, 1956    ADMISSION DATE:  09/10/2013 CONSULTATION DATE:  09/10/2013  REFERRING MD :  Dell Ponto  CHIEF COMPLAINT:  Short of breath  BRIEF PATIENT DESCRIPTION:  57 yo female former smoker with worsening dyspnea.  Found to have respiratory acidosis, and unable to tolerated NiPPV.  Has hx of COPD and NSCLC.  Previously required intubation.  PCCM consulted to assess for admission to ICU.  SIGNIFICANT EVENTS: 12/09 Admit to ICU  STUDIES:    LINES / TUBES: PIV  CULTURES: Sputum c/s 12/9  ANTIBIOTICS: Doxy 12/9 Rocephin 12/9  HISTORY OF PRESENT ILLNESS:   57 y.o.F with Copd and hx of lung ca, ongoing tobacco use. Adm with acute resp distress and HTNive crisis.  Pt on bipap at first, now off.  CCM asked to consult.   Pt has not been seen in pulm office previously    PAST MEDICAL HISTORY :  Past Medical History  Diagnosis Date  . Tobacco abuse   . COPD (chronic obstructive pulmonary disease)     exacerbagtion in 6/12 and in 2010.   Marland Kitchen Hypertension   . History of radiation therapy 07/19/10 to 07/28/10    RUL lung  . 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 Alcohol abuse     2 beers a day  . Asthma   . Headache(784.0)   . Chronic hypercapnic respiratory failure   . Polysubstance abuse   . Edema 05/31/13    leg swelling,  . Supplemental oxygen dependent     sob    Past Surgical History  Procedure Laterality Date  . No past surgeries     Prior to Admission medications   Medication Sig Start Date End Date Taking? Authorizing Provider  albuterol (PROVENTIL) (5 MG/ML) 0.5% nebulizer solution Take 0.5 mLs (2.5 mg total) by nebulization every 6 (six) hours as needed for wheezing or shortness of breath. 06/12/13  Yes Genelle Gather, MD  albuterol (VENTOLIN HFA) 108 (90 BASE) MCG/ACT inhaler Inhale 2 puffs into the lungs every 4 (four) hours as needed for wheezing or shortness of  breath. 07/29/13  Yes Carlynn Purl, DO  aspirin EC 81 MG tablet Take 81 mg by mouth daily.   Yes Historical Provider, MD  buPROPion (WELLBUTRIN) 100 MG tablet Take 1 tablet (100 mg total) by mouth 3 (three) times daily. Start with 100mg  BID for 3 days, then continue with 100mg  TID. 06/17/13 06/17/14 Yes Carlynn Purl, DO  Fluticasone-Salmeterol (ADVAIR) 500-50 MCG/DOSE AEPB Inhale 1 puff into the lungs every 12 (twelve) hours. 06/12/13  Yes Genelle Gather, MD  gabapentin (NEURONTIN) 300 MG capsule Take 1 capsule (300 mg total) by mouth at bedtime. 06/17/13 06/17/14 Yes Carlynn Purl, DO  hydrochlorothiazide (HYDRODIURIL) 25 MG tablet Take 1 tablet (25 mg total) by mouth daily. 06/17/13  Yes Carlynn Purl, DO  ipratropium (ATROVENT) 0.02 % nebulizer solution Take 2.5 mLs (0.5 mg total) by nebulization every 6 (six) hours. 01/18/13  Yes Bronson Curb, MD  losartan (COZAAR) 50 MG tablet Take 1 tablet (50 mg total) by mouth daily. 06/17/13 06/17/14 Yes Carlynn Purl, DO   Allergies  Allergen Reactions  . Lisinopril Cough    FAMILY HISTORY:  Family History  Problem Relation Age of Onset  . Heart disease Neg Hx   . Cancer Mother     lung  . Cancer Father     lung  .  Hypertension     SOCIAL HISTORY:  reports that she has been smoking Cigarettes.  She has a 8 pack-year smoking history. She has never used smokeless tobacco. She reports that she uses illicit drugs (Cocaine and Marijuana). She reports that she does not drink alcohol.  REVIEW OF SYSTEMS:   Pos in BOLD Constitutional:   No  weight loss, night sweats,  Fevers, chills, fatigue, lassitude. HEENT:   No headaches,  Difficulty swallowing,  Tooth/dental problems,  Sore throat,                No sneezing, itching, ear ache, nasal congestion, post nasal drip,   CV:  No chest pain,  Orthopnea, PND, swelling in lower extremities, anasarca, dizziness, palpitations  GI  No heartburn, indigestion, abdominal pain, nausea, vomiting, diarrhea, change in  bowel habits, loss of appetite  Resp: hortness of breath with exertion or at rest.   excess mucus,  productive cough,  No non-productive cough,  No coughing up of blood.   change in color of mucus.  wheezing.  No chest wall deformity  Skin: no rash or lesions.  GU: no dysuria, change in color of urine, no urgency or frequency.  No flank pain.  MS:  No joint pain or swelling.  No decreased range of motion.  No back pain.  Psych:  No change in mood or affect. No depression or anxiety.  No memory loss.   SUBJECTIVE:  Prod cough no distress  VITAL SIGNS: Temp:  [99.1 F (37.3 C)] 99.1 F (37.3 C) (12/09 1355) Pulse Rate:  [112-127] 113 (12/09 2124) Resp:  [19-30] 26 (12/09 2124) BP: (134-200)/(77-95) 190/90 mmHg (12/09 2124) SpO2:  [94 %-100 %] 94 % (12/09 2124) HEMODYNAMICS: BP 180/110   VENTILATOR SETTINGS:   INTAKE / OUTPUT: Intake/Output   None     PHYSICAL EXAMINATION: General:  Ill appearing AAf in nad Neuro:  Non focal HEENT:  Moist mucus membraines CV: RRR nl s2 s1 no m r h g Lungs:  Exp wheezes, poor airflow ABD: soft NT bsa  Musculoskeletal:  From SKIN: clear  LABS:  CBC  Recent Labs Lab 09/10/13 1454  WBC 7.9  HGB 15.0  HCT 46.9*  PLT 210   Coag's No results found for this basename: APTT, INR,  in the last 168 hours  BMET  Recent Labs Lab 09/10/13 1454 09/10/13 2015  NA 129* 132*  K 5.9* 5.4*  CL 94* 94*  CO2 22 30  BUN 15 14  CREATININE 0.64 0.60  GLUCOSE 89 106*   Electrolytes  Recent Labs Lab 09/10/13 1454 09/10/13 2015  CALCIUM 9.3 9.6   Sepsis Markers  Recent Labs Lab 09/10/13 1632  LATICACIDVEN 1.1   ABG  Recent Labs Lab 09/10/13 2048  PHART 7.185*  PCO2ART 78.1*  PO2ART 57.0*   Liver Enzymes  Recent Labs Lab 09/10/13 1454  AST 41*  ALT 23  ALKPHOS 186*  BILITOT 0.4  ALBUMIN 3.5   Cardiac Enzymes  Recent Labs Lab 09/10/13 1454  TROPONINI <0.30  PROBNP 3402.0*   Glucose No results found for  this basename: GLUCAP,  in the last 168 hours  Imaging Dg Chest 2 View  09/10/2013   CLINICAL DATA:  Pain.  EXAM: CHEST  2 VIEW  COMPARISON:  Chest radiograph 06/11/2013.  FINDINGS: Stable cardiac and mediastinal contours. No significant interval change irregular opacity within the right upper medial hemi thorax. Minimal heterogeneous opacities right lung base. No pleural effusion or pneumothorax. Regional skeleton to  unremarkable  IMPRESSION: Minimal heterogeneous opacities right lung base. While these may represent atelectasis, infection not excluded.  Grossly stable irregular density within the right upper medial hemi thorax, better delineated on prior chest CTs. Tumor recurrence cannot be excluded.   Electronically Signed   By: Annia Belt M.D.   On: 09/10/2013 15:02    ASSESSMENT / PLAN:  Patient Active Problem List   Diagnosis Date Noted  . Dyspnea 09/10/2013  . COPD exacerbation 09/10/2013  . Neuropathy 06/17/2013  . Impaired fasting glucose 06/17/2013  . Depression, major 06/17/2013  . Lung cancer, upper lobe 02/21/2013  . Unspecified protein-calorie malnutrition 01/17/2013  . Hyponatremia 01/17/2013  . COPD with acute exacerbation 09/18/2012  . New onset a-fib 09/18/2012  . Acute hypercarbic respiratory failure 09/18/2012  . Anemia 07/25/2012  . Headache(784.0) 07/23/2012  . Preventative health care 07/23/2012  . Substance abuse 07/12/2012  . Hematest positive stools 04/26/2012  . Chronic hypercapnic respiratory failure 12/27/2011  . History of radiation therapy   . Humerus fracture, Left 05/20/2011  . ADENOCARCINOMA, RIGHT LUNG 05/21/2010  . ESSENTIAL HYPERTENSION 09/22/2009  . VISUAL IMPAIRMENT 11/19/2007  . Chronic alcoholism 09/04/2007  . TOBACCO USE 09/04/2007  . Severe chronic obstructive pulmonary disease 09/04/2007    PULMONARY A: Acute on chronic respiratory failure 2nd to AECOPD. Hx of NSCLC. P:   -scheduled BD's -oxygen to keep SpO2 >  90% -solumedrol -high risk for intubation  CARDIOVASCULAR A:  Hx of HTN with elevated blood pressure >> ? Compliance with therapy as outpt. P:  -monitor hemodynamics -prn hydralazine for SBP > 170  RENAL A:   Hyponatremia. Hyperkalemia >> likely from acidosis. P:   -check u/a, urine osmo, urine Na, TSH, cortisol -f/u BMET -monitor renal fx, urine outpt  GASTROINTESTINAL A:   Nutrition. P:   -NPO until respiratory status more stable  HEMATOLOGIC A:   No acute issues. P:  -f/u CBC -Lovenox for DVT prevention  INFECTIOUS A:   AECOPD. P:   -Levaquin day 1  ENDOCRINE A:   No issues. P:   -monitor blood sugars  NEUROLOGIC A:   Acute encephalopathy 2nd to respiratory failure. Hx of polysubstance abuse. P:   -monitor mental status -check UDS  TODAY'S SUMMARY:  57 yo female with AECOPD and acute on chronic respiratory failure with altered mental status.   Unable to tolerate BiPAP >> high risk need for intubation.  I have personally obtained a history, examined the patient, evaluated laboratory and imaging results, formulated the assessment and plan and placed orders. CRITICAL CARE: The patient is critically ill with multiple organ systems failure and requires high complexity decision making for assessment and support, frequent evaluation and titration of therapies, application of advanced monitoring technologies and extensive interpretation of multiple databases. Critical Care Time devoted to patient care services described in this note is   Dorcas Carrow Beeper  [phone]  Cell  [phone]  If no response or cell goes to voicemail, call beeper [phone]  Pulmonary and Critical Care Medicine [facility] Pager: [phone]  09/10/2013, 9:46 PM

## 2013-09-10 NOTE — ED Notes (Signed)
Pt. Began having productive cough and sob on exertion since yesterday.  Pt. Is speaking full sentences denies any chest pain , O2 sats are 96% on RA.  Applied oxygen 2l.  Skin is p/w/d, resp. E/u , Alert and oriented X 4.

## 2013-09-10 NOTE — Progress Notes (Addendum)
Patient states that she is not going to wear BiPap mask. She does not like to have anything on her face. Pt would not even take breathing treatment through mask. Pt not in any distress at this time. 4L [place] Sats 97%. Pt to be on BiPap for critical ABG results. Critical Care is coming to see patient.

## 2013-09-10 NOTE — Progress Notes (Signed)
Critical ABG values reports to Ali Lowe, RN

## 2013-09-10 NOTE — ED Provider Notes (Addendum)
CSN: [id number]     Arrival date & time 09/10/13  1354 History   First MD Initiated Contact with Patient 09/10/13 1348     Chief Complaint  Patient presents with  . Shortness of Breath   (Consider location/radiation/quality/duration/timing/severity/associated sxs/prior Treatment) HPI Patient presents with concern of a dyspnea. She has baseline dyspnea, and notes that over the past day she has had dyspnea on exertion as well.  There is minimal discomfort anywhere, including chest pain throughout. There is minimal such as lightheadedness, no syncope, no vomiting, no diarrhea. Patient denies medication changes, without changes.  No history she states that she recently stopped smoking.  She notes that she does have a history of non-small cell lung cancer, as well as COPD. No recent steroid use.  Past Medical History  Diagnosis Date  . Tobacco abuse   . COPD (chronic obstructive pulmonary disease)     exacerbagtion in 6/12 and in 2010.   Marland Kitchen Hypertension   . History of radiation therapy 07/19/10 to 07/28/10    RUL lung  . 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 Alcohol abuse     2 beers a day  . Asthma   . Headache(784.0)   . Chronic hypercapnic respiratory failure   . Polysubstance abuse   . Edema 05/31/13    leg swelling,  . Supplemental oxygen dependent     sob    Past Surgical History  Procedure Laterality Date  . No past surgeries     Family History  Problem Relation Age of Onset  . Heart disease Neg Hx   . Cancer Mother     lung  . Cancer Father     lung  . Hypertension     History  Substance Use Topics  . Smoking status: Current Some Day Smoker -- 0.20 packs/day for 40 years    Types: Cigarettes  . Smokeless tobacco: Never Used     Comment: PATIENT STATE SHE ONLY SMOKES ONLY 3 CIGARETRTES A DAYCut back from 2 ppd when diagnosed with Lung cancer.12/02/11 > 3 cig/day 07/20/12  . Alcohol Use: No     Comment: Former alcoholic   OB History   Grav Para Term  Preterm Abortions TAB SAB Ect Mult Living                 Review of Systems  Constitutional:       Per HPI, otherwise negative  HENT:       Per HPI, otherwise negative  Respiratory:       Per HPI, otherwise negative  Cardiovascular:       Per HPI, otherwise negative  Gastrointestinal: Negative for vomiting.  Endocrine:       Negative aside from HPI  Genitourinary:       Neg aside from HPI   Musculoskeletal:       Per HPI, otherwise negative  Skin: Negative.   Neurological: Negative for syncope.    Allergies  Lisinopril  Home Medications   Current Outpatient Rx  Name  Route  Sig  Dispense  Refill  . albuterol (PROVENTIL) (5 MG/ML) 0.5% nebulizer solution   Nebulization   Take 0.5 mLs (2.5 mg total) by nebulization every 6 (six) hours as needed for wheezing or shortness of breath.   20 mL   12   . albuterol (VENTOLIN HFA) 108 (90 BASE) MCG/ACT inhaler   Inhalation   Inhale 2 puffs into the lungs every 4 (four) hours as needed  for wheezing or shortness of breath.   18 g   3   . aspirin EC 81 MG tablet   Oral   Take 81 mg by mouth daily.         Marland Kitchen buPROPion (WELLBUTRIN) 100 MG tablet   Oral   Take 1 tablet (100 mg total) by mouth 3 (three) times daily. Start with 100mg  BID for 3 days, then continue with 100mg  TID.   90 tablet   2   . Fluticasone-Salmeterol (ADVAIR) 500-50 MCG/DOSE AEPB   Inhalation   Inhale 1 puff into the lungs every 12 (twelve) hours.   60 each   3   . gabapentin (NEURONTIN) 300 MG capsule   Oral   Take 1 capsule (300 mg total) by mouth at bedtime.   30 capsule   2   . hydrochlorothiazide (HYDRODIURIL) 25 MG tablet   Oral   Take 1 tablet (25 mg total) by mouth daily.   30 tablet   5   . ipratropium (ATROVENT) 0.02 % nebulizer solution   Nebulization   Take 2.5 mLs (0.5 mg total) by nebulization every 6 (six) hours.   75 mL   3   . losartan (COZAAR) 50 MG tablet   Oral   Take 1 tablet (50 mg total) by mouth daily.   30  tablet   5    BP 161/90  Pulse 112  Temp(Src) 99.1 F (37.3 C) (Oral)  Resp 24  SpO2 99% Physical Exam  Nursing note and vitals reviewed. Constitutional: She is oriented to person, place, and time. She has a sickly appearance.  HENT:  Head: Normocephalic and atraumatic.  Eyes: Conjunctivae and EOM are normal.  Neck: No tracheal deviation present.  Cardiovascular: Normal rate and regular rhythm.   Pulmonary/Chest: Accessory muscle usage present. No stridor. Tachypnea noted. She has decreased breath sounds.  Abdominal: She exhibits no distension.  Musculoskeletal: She exhibits no edema.  Neurological: She is alert and oriented to person, place, and time. No cranial nerve deficit.  Skin: Skin is warm. She is diaphoretic.  Psychiatric: She has a normal mood and affect.    ED Course  Procedures (including critical care time) Labs Review Labs Reviewed  PRO B NATRIURETIC PEPTIDE - Abnormal; Notable for the following:    Pro B Natriuretic peptide (BNP) 3402.0 (*)    All other components within normal limits  CBC WITH DIFFERENTIAL - Abnormal; Notable for the following:    HCT 46.9 (*)    RDW 19.0 (*)    All other components within normal limits  COMPREHENSIVE METABOLIC PANEL - Abnormal; Notable for the following:    Sodium 129 (*)    Potassium 5.9 (*)    Chloride 94 (*)    AST 41 (*)    Alkaline Phosphatase 186 (*)    All other components within normal limits  TROPONIN I  LACTIC ACID, PLASMA   Imaging Review Dg Chest 2 View  09/10/2013   CLINICAL DATA:  Pain.  EXAM: CHEST  2 VIEW  COMPARISON:  Chest radiograph 06/11/2013.  FINDINGS: Stable cardiac and mediastinal contours. No significant interval change irregular opacity within the right upper medial hemi thorax. Minimal heterogeneous opacities right lung base. No pleural effusion or pneumothorax. Regional skeleton to unremarkable  IMPRESSION: Minimal heterogeneous opacities right lung base. While these may represent  atelectasis, infection not excluded.  Grossly stable irregular density within the right upper medial hemi thorax, better delineated on prior chest CTs. Tumor recurrence cannot be  excluded.   Electronically Signed   By: Annia Belt M.D.   On: 09/10/2013 15:02    I reviewed the patient's records, including recent imaging studies, the planned PET scan, her history of smoking use, COPD.   EKG Interpretation    Date/Time:  Tuesday September 10 2013 13:35:28 EST Ventricular Rate:  126 PR Interval:  172 QRS Duration: 78 QT Interval:  286 QTC Calculation: 414 R Axis:   100 Text Interpretation:  Sinus tachycardia Biatrial enlargement Rightward axis Septal infarct , age undetermined Abnormal ECG Sinus tachycardia Rate faster Since last tracing Artifact Abnormal ekg Confirmed by Gerhard Munch  MD [phone]) on 09/10/2013 7:03:54 PM           7:05 PM Patient remains tachycardic No new complaints. BP is elevated  MDM   1. Dyspnea    This patient presents with dyspnea.  Initially the patient hypoxic and tachycardic, comfortable-appearing.  She improves initially here following interventions, but remained tachycardic and hypertensive.  With her ongoing vital sign abnormalities, her history of COPD, cancer, and no preceding diagnosis of CHF, but with elevated BNP she required admission for further evaluation and management. With her elevated BNP she did receive an initial dose of hydralazine.    Gerhard Munch, MD 09/10/13 Nicholos Johns  Gerhard Munch, MD 09/10/13 Windell Moment

## 2013-09-11 DIAGNOSIS — R0602 Shortness of breath: Secondary | ICD-10-CM

## 2013-09-11 LAB — BASIC METABOLIC PANEL
Calcium: 9.3 mg/dL (ref 8.4–10.5)
Chloride: 94 mEq/L — ABNORMAL LOW (ref 96–112)
Creatinine, Ser: 0.5 mg/dL (ref 0.50–1.10)
GFR calc Af Amer: >90 mL/min (ref >90)
GFR calc non Af Amer: >90 mL/min (ref >90)

## 2013-09-11 LAB — PROTIME-INR
INR: 0.92 (ref 0.00–1.49)
Prothrombin Time: 12.2 seconds (ref 11.6–15.2)

## 2013-09-11 LAB — TROPONIN I: Troponin I: <0.3 ng/mL (ref <0.30)

## 2013-09-11 LAB — MRSA PCR SCREENING: MRSA by PCR: NEGATIVE

## 2013-09-11 MED ORDER — ACETAMINOPHEN 325 MG PO TABS
650.0000 mg | ORAL_TABLET | Freq: Four times a day (QID) | ORAL | Status: DC | PRN
  Administered 2013-09-11 – 2013-09-13 (×4): 650 mg via ORAL
  Filled 2013-09-11 (×4): qty 2

## 2013-09-11 NOTE — Progress Notes (Signed)
Inpatient Diabetes Program Recommendations  AACE/ADA: New Consensus Statement on Inpatient Glycemic Control (2013)  Target Ranges:  Prepandial:   less than 140 mg/dL      Peak postprandial:   less than 180 mg/dL (1-2 hours)      Critically ill patients:  140 - 180 mg/dL   Results for MALISSA, SLAY (MRN [id number]) as of 09/11/2013 13:22  Ref. Range 06/17/2013 16:43  Hemoglobin A1C No range found 5.6   Results for EZME, DUCH (MRN [id number]) as of 09/11/2013 13:22  Ref. Range 09/10/2013 14:54 09/10/2013 20:15 09/11/2013 03:45  Glucose Latest Range: 70-99 mg/dL 89 782 (H) 956 (H)   Inpatient Diabetes Program Recommendations Correction (SSI): Please consider ordering CBGs with Novolog correction ACHS while inpatient and on steroids.  Note: Patient does not have a history of diabetes and initial lab glucose was 89 mg/dl on 21/3. Patient is ordered steroids which is likely cause of noted hyperglycemia.  Please consider ordering CBGs with Novolog correction ACHS while inpatient and on steroids.  Thanks, Orlando Penner, RN, MSN, CCRN Diabetes Coordinator Inpatient Diabetes Program [phone] (Team Pager) [phone] (AP office) [phone] [facility] office)

## 2013-09-11 NOTE — Progress Notes (Signed)
09/11/2013 patient transfer from 2100 to 6East she is alert, orient and ambulatory. Patient stated she fell 1 or 2 weeks ago she was placed on bed alarm. patient skin intact, but dry. Patient is not on telemetry. [facility] 352 RN.

## 2013-09-11 NOTE — Significant Event (Signed)
Pt c/o pain in feet.  Will try prn tylenol.  Coralyn Helling, MD 09/11/2013, 10:29 PM

## 2013-09-11 NOTE — Consult Note (Signed)
PULMONARY  / CRITICAL CARE MEDICINE  Name: Karina Weber MRN: [id number] DOB: 1956/05/06    ADMISSION DATE:  09/10/2013 CONSULTATION DATE:  09/10/2013  REFERRING MD :  Dell Ponto  CHIEF COMPLAINT:  Short of breath  BRIEF PATIENT DESCRIPTION:  57 yo female former smoker with worsening dyspnea.  Found to have respiratory acidosis, and unable to tolerated NiPPV.  Has hx of COPD and NSCLC.  Previously required intubation.  PCCM consulted to assess for admission to ICU.  SIGNIFICANT EVENTS: 12/09 Admit to ICU 12/10 Trf to med-surg  STUDIES:    LINES / TUBES:  CULTURES: Resp 12/9 >>   ANTIBIOTICS: Doxy 12/9 >>  Rocephin 12/9 >>    SUBJECTIVE:  Prod cough no distress    VITAL SIGNS: Temp:  [97.7 F (36.5 C)-99.1 F (37.3 C)] 97.7 F (36.5 C) (12/10 0805) Pulse Rate:  [68-127] 114 (12/10 0900) Resp:  [16-30] 16 (12/10 0900) BP: (127-200)/(70-99) 142/99 mmHg (12/10 0900) SpO2:  [90 %-100 %] 90 % (12/10 0900) Weight:  [43.1 kg (95 lb 0.3 oz)-43.6 kg (96 lb 1.9 oz)] 43.1 kg (95 lb 0.3 oz) (12/10 0112) HEMODYNAMICS:   VENTILATOR SETTINGS:   INTAKE / OUTPUT: Intake/Output     12/09 0701 - 12/10 0700 12/10 0701 - 12/11 0700   I.V. (mL/kg) 170 (3.9)    IV Piggyback 50    Total Intake(mL/kg) 220 (5.1)    Urine (mL/kg/hr) 2050    Total Output 2050     Net -1830            PHYSICAL EXAMINATION: General:  NAD. Desats readily off O2 Neuro:  Non focal HEENT:  Moist mucus membranes CV: RRR s M Lungs: diminished BS, Wellman wheezes anteriorly ABD: soft NT, NABS Ext: warm, no edema  LABS: I have reviewed all of today's lab results. Relevant abnormalities are discussed in the A/P section  IMP/PLAN: PULMONARY A: Acute on chronic respiratory failure AECOPD. Hx of NSCLC. P:   Cont systemic steroids Cont abx Cont nebulized BDs Cont supplemental O2  CARDIOVASCULAR A:  Hx of HTN  P:  Cont outpt regimen  RENAL A:   Hyponatremia, mild Hyperkalemia,  resolved P:   Monitor BMET intermittently Correct electrolytes as indicated   GASTROINTESTINAL A:   No issues P:   Cont diet  HEMATOLOGIC A:   No acute issues. P:  -f/u CBC -Lovenox for DVT prevention  INFECTIOUS A:   Purulent bronchitis P:   Micro and abx as aobve  ENDOCRINE A:   No issues. P:   -monitor blood sugars  NEUROLOGIC A:   Acute encephalopathy, resolved Hx of polysubstance abuse. P:   -monitor   TODAY'S SUMMARY:  Transfer to med-surg   Billy Fischer, MD ; [facility] [phone].  After 5:30 PM or weekends, call [phone]   Pulmonary and Critical Care Medicine [facility] Pager: [phone]  09/11/2013, 9:55 AM

## 2013-09-12 ENCOUNTER — Encounter (HOSPITAL_COMMUNITY): Payer: Self-pay | Admitting: Radiology

## 2013-09-12 ENCOUNTER — Inpatient Hospital Stay (HOSPITAL_COMMUNITY): Payer: Medicare Other

## 2013-09-12 DIAGNOSIS — J9600 Acute respiratory failure, unspecified whether with hypoxia or hypercapnia: Secondary | ICD-10-CM

## 2013-09-12 DIAGNOSIS — J441 Chronic obstructive pulmonary disease with (acute) exacerbation: Secondary | ICD-10-CM

## 2013-09-12 LAB — BASIC METABOLIC PANEL
CO2: 35 mEq/L — ABNORMAL HIGH (ref 19–32)
Calcium: 9.2 mg/dL (ref 8.4–10.5)
Creatinine, Ser: 0.55 mg/dL (ref 0.50–1.10)
GFR calc Af Amer: >90 mL/min (ref >90)
GFR calc non Af Amer: >90 mL/min (ref >90)
Potassium: 4.7 mEq/L (ref 3.5–5.1)
Sodium: 135 mEq/L (ref 135–145)

## 2013-09-12 LAB — GLUCOSE, CAPILLARY: Glucose-Capillary: 110 mg/dL — ABNORMAL HIGH (ref 70–99)

## 2013-09-12 LAB — D-DIMER, QUANTITATIVE (NOT AT ARMC): D-Dimer, Quant: 1.14 ug/mL-FEU — ABNORMAL HIGH (ref 0.00–0.48)

## 2013-09-12 LAB — TROPONIN I: Troponin I: <0.3 ng/mL (ref <0.30)

## 2013-09-12 MED ORDER — LEVALBUTEROL HCL 1.25 MG/0.5ML IN NEBU
1.2500 mg | INHALATION_SOLUTION | Freq: Four times a day (QID) | RESPIRATORY_TRACT | Status: DC
  Administered 2013-09-12 – 2013-09-13 (×4): 1.25 mg via RESPIRATORY_TRACT
  Filled 2013-09-12 (×7): qty 0.5

## 2013-09-12 MED ORDER — IOHEXOL 350 MG/ML SOLN
80.0000 mL | Freq: Once | INTRAVENOUS | Status: AC | PRN
  Administered 2013-09-12: 80 mL via INTRAVENOUS

## 2013-09-12 MED ORDER — LEVALBUTEROL HCL 0.63 MG/3ML IN NEBU
0.6300 mg | INHALATION_SOLUTION | RESPIRATORY_TRACT | Status: DC | PRN
  Administered 2013-09-13 – 2013-09-14 (×3): 0.63 mg via RESPIRATORY_TRACT
  Filled 2013-09-12: qty 3

## 2013-09-12 MED ORDER — METHYLPREDNISOLONE SODIUM SUCC 40 MG IJ SOLR
40.0000 mg | Freq: Two times a day (BID) | INTRAMUSCULAR | Status: DC
  Administered 2013-09-12 (×2): 40 mg via INTRAVENOUS
  Filled 2013-09-12 (×4): qty 1

## 2013-09-12 MED ORDER — BUDESONIDE 0.25 MG/2ML IN SUSP
0.2500 mg | Freq: Four times a day (QID) | RESPIRATORY_TRACT | Status: DC
  Administered 2013-09-12 – 2013-09-14 (×6): 0.25 mg via RESPIRATORY_TRACT
  Filled 2013-09-12 (×9): qty 2

## 2013-09-12 MED ORDER — LEVALBUTEROL HCL 0.63 MG/3ML IN NEBU
1.2500 mg | INHALATION_SOLUTION | Freq: Four times a day (QID) | RESPIRATORY_TRACT | Status: DC
  Filled 2013-09-12 (×3): qty 6

## 2013-09-12 NOTE — Progress Notes (Deleted)
PULMONARY  / CRITICAL CARE MEDICINE  Name: Karina Weber MRN: [id number] DOB: November 20, 1955    ADMISSION DATE:  09/10/2013 CONSULTATION DATE:  09/10/2013  REFERRING MD :  Dell Ponto  CHIEF COMPLAINT:  Short of breath  BRIEF PATIENT DESCRIPTION:  57 yo female former smoker with worsening dyspnea.  Found to have respiratory acidosis, and unable to tolerated NiPPV.  Has hx of COPD and NSCLC.  Previously required intubation.  PCCM consulted to assess for admission to ICU.  SIGNIFICANT EVENTS: 12/09 Admit to ICU 12/10 Trf to med-surg 12-11 0700 complains of mid sternal chest pain, sudden onset while resting in bed. Pain increases with external pressure applied. Increased wob noted.  Hemodynamically stable. STUDIES:    LINES / TUBES:  CULTURES: Resp 12/9 >>   ANTIBIOTICS: Doxy 12/9 >>  Rocephin 12/9 >>    SUBJECTIVE:  Increased wob. MSCP   VITAL SIGNS: Temp:  [97.3 F (36.3 C)-98.3 F (36.8 C)] 97.3 F (36.3 C) (12/11 0509) Pulse Rate:  [95-114] 95 (12/11 0655) Resp:  [16-29] 18 (12/11 0509) BP: (115-150)/(69-99) 145/82 mmHg (12/11 0655) SpO2:  [90 %-97 %] 92 % (12/11 0509) Weight:  [97 lb 1.6 oz (44.044 kg)] 97 lb 1.6 oz (44.044 kg) (12/10 2147) HEMODYNAMICS:   VENTILATOR SETTINGS:   INTAKE / OUTPUT: Intake/Output     12/10 0701 - 12/11 0700 12/11 0701 - 12/12 0700   P.O. 120    I.V. (mL/kg) 70 (1.6)    IV Piggyback     Total Intake(mL/kg) 190 (4.3)    Urine (mL/kg/hr) 150 (0.1)    Total Output 150     Net +40          Urine Occurrence 5 x    Stool Occurrence 1 x      PHYSICAL EXAMINATION: General:  Complains of chest pain. Increased WOB, very difficult to extract information from her, Neuro:  Non focal HEENT:  Moist mucus membranes, poor dentation CV: RRR s M Lungs: diminished BS, Wilder wheezes anteriorly ABD: soft NT, NABS Ext: warm, no edema  LABS:  Recent Labs Lab 09/10/13 1454 09/10/13 2015 09/11/13 0345  NA 129* 132* 131*  K 5.9* 5.4*  4.8  CL 94* 94* 94*  CO2 22 30 29   BUN 15 14 15   CREATININE 0.64 0.60 0.50  GLUCOSE 89 106* 191*    Recent Labs Lab 09/10/13 1454  HGB 15.0  HCT 46.9*  WBC 7.9  PLT 210    Dg Chest 2 View  09/10/2013   CLINICAL DATA:  Pain.  EXAM: CHEST  2 VIEW  COMPARISON:  Chest radiograph 06/11/2013.  FINDINGS: Stable cardiac and mediastinal contours. No significant interval change irregular opacity within the right upper medial hemi thorax. Minimal heterogeneous opacities right lung base. No pleural effusion or pneumothorax. Regional skeleton to unremarkable  IMPRESSION: Minimal heterogeneous opacities right lung base. While these may represent atelectasis, infection not excluded.  Grossly stable irregular density within the right upper medial hemi thorax, better delineated on prior chest CTs. Tumor recurrence cannot be excluded.   Electronically Signed   By: Annia Belt M.D.   On: 09/10/2013 15:02     IMP/PLAN: PULMONARY A: Acute on chronic respiratory failure AECOPD. Hx of NSCLC. P:   Cont systemic steroids Cont abx Cont nebulized BDs Cont supplemental O2  CARDIOVASCULAR A:  Hx of HTN 12-11 new onset mscp, increases with palpation, Could be from bronchitis, PE, or cardiac origin.  P:  Cont outpt regimen for HTN Stat 12  lead Cycle cardiac enzymes Strong consideration for CT angio chest to ro PE with hx of lunf ca Transfer back to ICU for safety.   RENAL A:   Hyponatremia, mild Hyperkalemia, resolved P:   Monitor BMET intermittently Correct electrolytes as indicated   GASTROINTESTINAL A:   No issues P:   Cont diet  HEMATOLOGIC A:   No acute issues. P:  -f/u CBC -Lovenox for DVT prevention  INFECTIOUS A:   Purulent bronchitis P:   Micro and abx as aobve  ENDOCRINE A:   No issues. P:   -monitor blood sugars  NEUROLOGIC A:   Acute encephalopathy, resolved Hx of polysubstance abuse. P:   -monitor   TODAY'S SUMMARY:  Transfer to ICU, 12 lead, CE,  CT angio. May need cards consult.   Brett Canales Minor ACNP [facility] PCCM Pager [phone] till 3 pm If no answer page [phone] 09/12/2013, 7:22 AM

## 2013-09-12 NOTE — Progress Notes (Signed)
Patient c/o mid chest pain,burning "like somebody hit me in the chest". Pain 8/10.VS BP 145/82 HR 100 O2 sat 92 on 3L/[place].Dr. Tyson Alias notified,order received.Will continue to monitor. Kimiah Hibner Joselita,RN

## 2013-09-12 NOTE — Progress Notes (Signed)
PULMONARY  / CRITICAL CARE MEDICINE  Name: Karina Weber MRN: [id number] DOB: 11/23/1955    ADMISSION DATE:  09/10/2013 CONSULTATION DATE:  09/10/2013  REFERRING MD :  Dell Ponto  CHIEF COMPLAINT:  Short of breath  BRIEF PATIENT DESCRIPTION:  57 yo female former smoker with worsening dyspnea.  Found to have respiratory acidosis, and unable to tolerated NiPPV.  Has hx of COPD and NSCLC.  Previously required intubation.  PCCM consulted to assess for admission to ICU.  SIGNIFICANT EVENTS: 12/09 Admit to ICU 12/10 Trf to med-surg 12/11 sudden onset of substernal CP and dyspnea. Transferred back to ICU. D-dimer mildly elevated.  12/11 CTA chest: no PE. NSC compared to prior CTs    LINES / TUBES:  CULTURES: Resp 12/9 >> canceled  ANTIBIOTICS: Doxy 12/9 >> 12/11 Rocephin 12/9 >>    SUBJECTIVE:  Increased wob. Cognition intact   VITAL SIGNS: Temp:  [97 F (36.1 C)-98.3 F (36.8 C)] 97 F (36.1 C) (12/11 1221) Pulse Rate:  [95-107] 95 (12/11 1200) Resp:  [15-31] 15 (12/11 1200) BP: (115-150)/(72-114) 136/114 mmHg (12/11 1200) SpO2:  [87 %-98 %] 97 % (12/11 1200) Weight:  [44.044 kg (97 lb 1.6 oz)] 44.044 kg (97 lb 1.6 oz) (12/10 2147) HEMODYNAMICS:   VENTILATOR SETTINGS:   INTAKE / OUTPUT: Intake/Output     12/10 0701 - 12/11 0700 12/11 0701 - 12/12 0700   P.O. 120    I.V. (mL/kg) 70 (1.6) 40 (0.9)   IV Piggyback 50    Total Intake(mL/kg) 240 (5.4) 40 (0.9)   Urine (mL/kg/hr) 150 (0.1)    Total Output 150     Net +90 +40        Urine Occurrence 5 x 1 x   Stool Occurrence 1 x      PHYSICAL EXAMINATION: General:  Mildly dyspneic @ rest, Neuro:  No focal deficits HEENT:  NSC CV: RRR s M Lungs: diminished BS, mild retractions, scattered distant wheezes ABD: soft NT, NABS Ext: warm, no edema  LABS:  I have reviewed all of today's lab results. Relevant abnormalities are discussed in the A/P section  CXR: NNF EKG:  NSC  IMP/PLAN: PULMONARY A: Acute on chronic respiratory failure AECOPD. Hx of NSCLC - stable by CT chest 12/11 P:   Cont systemic steroids - increased 12/11 Cont nebulized BDs and steroids Cont supplemental O2  CARDIOVASCULAR A:  Hx of HTN Chest pain - doubt cardiac  P:  Cont outpt regimen for HTN Cycle cardiac markers  RENAL A:   Hyponatremia, mild Hyperkalemia, resolved P:   Monitor BMET intermittently Correct electrolytes as indicated   GASTROINTESTINAL A:   No issues P:   Cont diet  HEMATOLOGIC A:   No acute issues. P:  DVT px: SQ enoxaparin CBC intermittently  INFECTIOUS A:   Purulent bronchitis P:   Micro and abx as above  ENDOCRINE A:   No issues. P:   Monitor glu on chem panels Consider SSI for glu > 180   NEUROLOGIC A:   Acute encephalopathy, resolved Hx of polysubstance abuse. P:   -monitor   TODAY'S SUMMARY:    Brett Canales Minor ACNP [facility] PCCM Pager [phone] till 3 pm If no answer page [phone] 09/12/2013, 2:04 PM  PCCM ATTENDING: I have interviewed and examined the patient and reviewed the database. I have formulated the assessment and plan as reflected in the note above with amendments made by me.   Billy Fischer, MD;  PCCM service; Mobile [phone]

## 2013-09-12 NOTE — Progress Notes (Signed)
[id number] patient was transfer to 71m13, she was sinus on telemetry at 0815. Patient was stable when she left 6east. She was on 3 Liters of [place] at 91 to 92 percent.  12 lead ekg was completed and reported was given to nurse receiving patient. Patient was sent to 20m13 to be closely monitor.  [facility] RN.

## 2013-09-13 LAB — GLUCOSE, CAPILLARY: Glucose-Capillary: 136 mg/dL — ABNORMAL HIGH (ref 70–99)

## 2013-09-13 MED ORDER — DOXYCYCLINE HYCLATE 100 MG PO TABS
100.0000 mg | ORAL_TABLET | Freq: Two times a day (BID) | ORAL | Status: DC
  Administered 2013-09-13 – 2013-09-14 (×3): 100 mg via ORAL
  Filled 2013-09-13 (×4): qty 1

## 2013-09-13 MED ORDER — LEVALBUTEROL HCL 1.25 MG/0.5ML IN NEBU
0.6300 mg | INHALATION_SOLUTION | Freq: Four times a day (QID) | RESPIRATORY_TRACT | Status: DC
  Filled 2013-09-13 (×4): qty 0.26

## 2013-09-13 MED ORDER — PREDNISONE 20 MG PO TABS
40.0000 mg | ORAL_TABLET | Freq: Every day | ORAL | Status: DC
  Administered 2013-09-14: 40 mg via ORAL
  Filled 2013-09-13 (×2): qty 2

## 2013-09-13 NOTE — Progress Notes (Signed)
PULMONARY  / CRITICAL CARE MEDICINE  Name: Karina Weber MRN: [id number] DOB: [birth year]    ADMISSION DATE:  09/10/2013 CONSULTATION DATE:  09/10/2013  REFERRING MD :  Dell Ponto  CHIEF COMPLAINT:  Short of breath  BRIEF PATIENT DESCRIPTION:  57 yo female former smoker with worsening dyspnea.  Found to have respiratory acidosis, and unable to tolerated NiPPV.  Has hx of COPD and NSCLC.  Previously required intubation.  PCCM consulted to assess for admission to ICU.  SIGNIFICANT EVENTS: 12/09 Admit to ICU 12/10 Trf to med-surg 12/11 sudden onset of substernal CP and dyspnea. Transferred back to ICU. D-dimer mildly elevated.  12/11 CTA chest: no PE. NSC compared to prior CTs 12/12 Much improved. Cardiac panel negative. Transfer to med-surg  LINES / TUBES:  CULTURES: Resp 12/9 >> canceled  ANTIBIOTICS: Doxy 12/9 >> 12/11 Rocephin 12/9 >>    SUBJECTIVE:  NAD, no new complaints   VITAL SIGNS: Temp:  [97.3 F (36.3 C)-98.2 F (36.8 C)] 97.3 F (36.3 C) (12/12 1133) Pulse Rate:  [53-108] 103 (12/12 1400) Resp:  [16-35] 22 (12/12 0700) BP: (103-155)/(61-128) 103/62 mmHg (12/12 1400) SpO2:  [88 %-100 %] 91 % (12/12 1400) Weight:  [44.1 kg (97 lb 3.6 oz)] 44.1 kg (97 lb 3.6 oz) (12/12 0500) HEMODYNAMICS:   VENTILATOR SETTINGS:   INTAKE / OUTPUT: Intake/Output     12/11 0701 - 12/12 0700 12/12 0701 - 12/13 0700   P.O.     I.V. (mL/kg) 40 (0.9)    IV Piggyback 50    Total Intake(mL/kg) 90 (2)    Urine (mL/kg/hr)     Total Output       Net +90          Urine Occurrence 8 x 1 x     PHYSICAL EXAMINATION: General:  Mildly dyspneic @ rest, Neuro:  No focal deficits HEENT:  NSC CV: RRR s M Lungs: diminished BS, no wheezes ABD: soft NT, NABS Ext: warm, no edema  LABS:  I have reviewed all of today's lab results. Relevant abnormalities are discussed in the A/P section  CXR: NNF   IMP/PLAN: PULMONARY A: Acute on chronic respiratory failure AECOPD,  resolving Hx of NSCLC - stable by CT chest 12/11 P:   Cont systemic steroids  Cont nebulized BDs and steroids Cont supplemental O2  CARDIOVASCULAR A:  Hx of HTN Chest pain - doubt cardiac  P:  Cont outpt regimen for HTN   RENAL A:   Hyponatremia, mild Hyperkalemia, resolved P:   Monitor BMET intermittently Correct electrolytes as indicated   GASTROINTESTINAL A:   No issues P:   Cont diet  HEMATOLOGIC A:   No acute issues. P:  DVT px: ambulating CBC intermittently  INFECTIOUS A:   Purulent bronchitis P:   Micro and abx as above  ENDOCRINE A:   No issues. P:   Monitor glu on chem panels Consider SSI for glu > 180   NEUROLOGIC A:   Acute encephalopathy, resolved Hx of polysubstance abuse. P:   Monitor   TODAY'S SUMMARY:  Transfer to med-surg today. Possibly will be ready for DC home 12/13  Billy Fischer, MD;  PCCM service; Mobile [phone]

## 2013-09-13 NOTE — Progress Notes (Signed)
Patient being transferred to 6N16. Report called. Patient stable ambulating by self with stand by assist. No evidence of SOB. Currently on 2Liters

## 2013-09-14 ENCOUNTER — Telehealth: Payer: Self-pay | Admitting: Adult Health

## 2013-09-14 DIAGNOSIS — C3410 Malignant neoplasm of upper lobe, unspecified bronchus or lung: Secondary | ICD-10-CM

## 2013-09-14 DIAGNOSIS — J9610 Chronic respiratory failure, unspecified whether with hypoxia or hypercapnia: Secondary | ICD-10-CM

## 2013-09-14 LAB — GLUCOSE, CAPILLARY: Glucose-Capillary: 138 mg/dL — ABNORMAL HIGH (ref 70–99)

## 2013-09-14 MED ORDER — PREDNISONE 20 MG PO TABS: ORAL_TABLET | ORAL | Status: DC

## 2013-09-14 MED ORDER — DOXYCYCLINE HYCLATE 100 MG PO TABS: ORAL_TABLET | ORAL | Status: DC

## 2013-09-14 MED ORDER — IPRATROPIUM BROMIDE 0.02 % IN SOLN
0.5000 mg | Freq: Three times a day (TID) | RESPIRATORY_TRACT | Status: DC
  Administered 2013-09-14: 0.5 mg via RESPIRATORY_TRACT
  Filled 2013-09-14: qty 2.5

## 2013-09-14 MED ORDER — BUDESONIDE 0.25 MG/2ML IN SUSP
0.2500 mg | Freq: Three times a day (TID) | RESPIRATORY_TRACT | Status: DC
  Administered 2013-09-14: 0.25 mg via RESPIRATORY_TRACT
  Filled 2013-09-14 (×4): qty 2

## 2013-09-14 MED ORDER — LEVALBUTEROL HCL 1.25 MG/0.5ML IN NEBU
0.6300 mg | INHALATION_SOLUTION | Freq: Three times a day (TID) | RESPIRATORY_TRACT | Status: DC
  Administered 2013-09-14: 0.63 mg via RESPIRATORY_TRACT
  Filled 2013-09-14 (×4): qty 0.26

## 2013-09-14 NOTE — Telephone Encounter (Signed)
Please call Ms. Dejoseph for hospital follow-up appointment with TP end of week 12/14-12/18

## 2013-09-14 NOTE — Discharge Summary (Signed)
I have examined this patient and agree with discharge plans as above.

## 2013-09-14 NOTE — Discharge Summary (Signed)
Physician Discharge Summary  Patient ID: Karina Weber MRN: [id number] DOB/AGE: 1956/09/23 57 y.o.  Admit date: 09/10/2013 Discharge date: 09/14/2013    Discharge Diagnoses:  Principal Problem:   COPD with acute exacerbation Active Problems:   ADENOCARCINOMA, RIGHT LUNG   ESSENTIAL HYPERTENSION   Substance abuse   COPD exacerbation    Brief Summary: Karina Weber 57 yo with hx O2 dependent COPD, ongoing tobacco abuse and NSCLCA presented 12/9 with worsening dyspnea. Initially required bipap but weaned off quickly.  She was admitted to ICU and treated for AECOPD and purulent bronchitis with abx, nebulized BD's, IV steroids, O2,  She improved and was tx to floor 12/10 but developed sudden onset dyspnea and returned to ICU 12/11. D dimer was mildly elevated but CTA chest was neg for PE, cardiac panel negative and she was tx back to floor.  She is now much improved, on RA and back to baseline resp status and ready for d/c home pending close outpt f/u.      SIGNIFICANT EVENTS:  12/09 Admit to ICU  12/10 Trf to med-surg  12/11 sudden onset of substernal CP and dyspnea. Transferred back to ICU. D-dimer mildly elevated.  12/11 CTA chest: no PE. NSC compared to prior CTs  12/12 Much improved. Cardiac panel negative. Transfer to med-surg   LINES / TUBES:   CULTURES:  Resp 12/9 >> canceled   ANTIBIOTICS:  Doxy 12/9 >> 12/11  Rocephin 12/9 >> 12/12 Doxy 12/12>>>                                                                   D/c plan by Discharge Diagnosis  Acute on chronic respiratory failure  AECOPD, resolving  Hx of NSCLC - stable by CT chest 12/11  D/c plan-- Cont prednisone 40 x 1 week, then 20 mg daily tll seen at ROV on 12/18  Cont home BD's -- atrovent, advair, PRN albuterol  Cont home O2  outpt pulm f/u  Complete course doxy   Hx of HTN  Chest pain - doubt cardiac Troponin not elevated, CTA neg for PE.  Resolved.  Likely r/t anxiety.  D/c plan -  Cont outpt  regimen for HTN PCP f/u     Hyponatremia, mild  Hyperkalemia, resolved  D/c plan:  PCP f/u with BMET   Purulent bronchitis  D/c plan -  Complete course doxy x 3 more days    Acute encephalopathy, resolved.  R/t acute resp failure.  Hx of polysubstance abuse.  P:  Monitor   Adenocarcinoma R Lung  A: XRT by Dr Mitzi Hansen. She missed most recent appt- lack of transportation.  P: Emphasize importance of rescheduling and maintaining that f/u   TODAY'S SUMMARY:  Ready for discharge home. F/u with her PCP ? Outpatient clinic, with Rad Onc Dr Mitzi Hansen, with TP/ [name] 12/18.    Filed Vitals:   09/13/13 2120 09/13/13 2300 09/14/13 0540 09/14/13 1117  BP:  137/73 107/93   Pulse:  88 98   Temp:   98 F (36.7 C)   TempSrc:   Oral   Resp:   18   Height:      Weight:      SpO2: 94%  94% 92%     Discharge Labs  BMET  Recent Labs Lab 09/10/13 1454 09/10/13 2015 09/11/13 0345 09/12/13 0703  NA 129* 132* 131* 135  K 5.9* 5.4* 4.8 4.7  CL 94* 94* 94* 92*  CO2 22 30 29  35*  GLUCOSE 89 106* 191* 91  BUN 15 14 15 23   CREATININE 0.64 0.60 0.50 0.55  CALCIUM 9.3 9.6 9.3 9.2     CBC   Recent Labs Lab 09/10/13 1454  HGB 15.0  HCT 46.9*  WBC 7.9  PLT 210   Anti-Coagulation  Recent Labs Lab 09/10/13 0001  INR 0.92      Discharge Orders   Future Orders Complete By Expires   Diet - low sodium heart healthy  As directed    Increase activity slowly  As directed           Follow-up Information   Follow up with PARRETT,TAMMY, NP. (office will call you - if you do not hear from them monday 12/14 please call )    Specialty:  Nurse Practitioner   Contact information:   [address] [place] [address] [phone]       Follow up with Gust Rung, DO. Schedule an appointment as soon as possible for a visit in 1 week.   Specialty:  Internal Medicine   Contact information:   [address] [place] [address] [phone]           Medication List         albuterol (5 MG/ML) 0.5% nebulizer solution  Commonly known as:  PROVENTIL  Take 0.5 mLs (2.5 mg total) by nebulization every 6 (six) hours as needed for wheezing or shortness of breath.     albuterol 108 (90 BASE) MCG/ACT inhaler  Commonly known as:  VENTOLIN HFA  Inhale 2 puffs into the lungs every 4 (four) hours as needed for wheezing or shortness of breath.     aspirin EC 81 MG tablet  Take 81 mg by mouth daily.     buPROPion 100 MG tablet  Commonly known as:  WELLBUTRIN  Take 1 tablet (100 mg total) by mouth 3 (three) times daily. Start with 100mg  BID for 3 days, then continue with 100mg  TID.     doxycycline 100 MG tablet  Commonly known as:  VIBRA-TABS  1 tab po q12 hours x 3 days     Fluticasone-Salmeterol 500-50 MCG/DOSE Aepb  Commonly known as:  ADVAIR  Inhale 1 puff into the lungs every 12 (twelve) hours.     gabapentin 300 MG capsule  Commonly known as:  NEURONTIN  Take 1 capsule (300 mg total) by mouth at bedtime.     hydrochlorothiazide 25 MG tablet  Commonly known as:  HYDRODIURIL  Take 1 tablet (25 mg total) by mouth daily.     ipratropium 0.02 % nebulizer solution  Commonly known as:  ATROVENT  Take 2.5 mLs (0.5 mg total) by nebulization every 6 (six) hours.     losartan 50 MG tablet  Commonly known as:  COZAAR  Take 1 tablet (50 mg total) by mouth daily.     predniSONE 20 MG tablet  Commonly known as:  DELTASONE  2 tabs po daily x 5 days then 1 tab po daily until otherwise directed at office visit 12/18          Disposition: 01-Home or Self Care  Discharged Condition: Karina Weber has met maximum benefit of inpatient care and is medically stable and cleared for discharge.  Patient is  pending follow up as above.      Time spent on disposition:  Greater than 35 minutes.   SignedDanford Bad, NP 09/14/2013  1:51 PM Pager: [phone] or [phone]  *Care during the described time interval was  provided by me and/or other providers on the critical care team. I have reviewed this patient's available data, including medical history, events of note, physical examination and test results as part of my evaluation.

## 2013-09-14 NOTE — Progress Notes (Signed)
PULMONARY  / CRITICAL CARE MEDICINE  Name: Karina Weber MRN: [id number] DOB: 05-13-1956    ADMISSION DATE:  09/10/2013 CONSULTATION DATE:  09/10/2013  REFERRING MD :  Dell Ponto  CHIEF COMPLAINT:  Short of breath  BRIEF PATIENT DESCRIPTION:  57 yo female former smoker with worsening dyspnea.  Found to have respiratory acidosis, and unable to tolerate NiPPV.  Has hx of COPD and NSCLC.  Previously required intubation.  PCCM consulted to assess for admission to ICU.  SIGNIFICANT EVENTS: 12/09 Admit to ICU 12/10 Trf to med-surg 12/11 sudden onset of substernal CP and dyspnea. Transferred back to ICU. D-dimer mildly elevated.  12/11 CTA chest: no PE. NSC compared to prior CTs 12/12 Much improved. Cardiac panel negative. Transfer to med-surg  LINES / TUBES:  CULTURES: Resp 12/9 >> canceled  ANTIBIOTICS: Doxy 12/9 >> 12/11 Rocephin 12/9 >>    SUBJECTIVE:  NAD, no new complaints. Feels ready to return home. Son can transport.   VITAL SIGNS: Temp:  [97.3 F (36.3 C)-98.4 F (36.9 C)] 98 F (36.7 C) (12/13 0540) Pulse Rate:  [53-108] 98 (12/13 0540) Resp:  [18] 18 (12/13 0540) BP: (103-137)/(62-107) 107/93 mmHg (12/13 0540) SpO2:  [88 %-96 %] 94 % (12/13 0540) FiO2 (%):  [28 %] 28 % (12/12 2120) HEMODYNAMICS:   VENTILATOR SETTINGS: Vent Mode:  [-]  FiO2 (%):  [28 %] 28 % INTAKE / OUTPUT: Intake/Output     12/12 0701 - 12/13 0700 12/13 0701 - 12/14 0700   P.O. 240    I.V. (mL/kg) 180 (4.1)    IV Piggyback     Total Intake(mL/kg) 420 (9.5)    Net +420          Urine Occurrence 7 x 1 x   Stool Occurrence 1 x      PHYSICAL EXAMINATION: General:  Mildly dyspneic @ rest,during conversation on 2L O2. Says this is baseline. Neuro:  No focal deficits HEENT:  NSC CV: RRR s M Lungs: diminished BS, no wheezes ABD: soft NT, NABS Ext: warm, no edema  LABS:    CXR: NNF   IMP/PLAN: PULMONARY A: Acute on chronic respiratory failure AECOPD, resolving Hx  of NSCLC - stable by CT chest 12/11 P:   Cont prednisone 40 x 1 week, then 20 mg daily tll seen at ROV on 12/18 Cont nebulized BDs and steroids Cont supplemental O2  CARDIOVASCULAR A:  Hx of HTN Chest pain - doubt cardiac  Troponin not elevated  P:  Cont outpt regimen for HTN   RENAL A:   Hyponatremia, mild Hyperkalemia, resolved P:   Monitor BMET intermittently Correct electrolytes as indicated   GASTROINTESTINAL A:   No issues P:   Cont diet  HEMATOLOGIC A:   No acute issues. P:  DVT px: ambulating CBC intermittently  INFECTIOUS A:   Purulent bronchitis P:   Micro and abx as above  ENDOCRINE A:   No issues. P:   Monitor glu on chem panels Consider SSI for glu > 180   NEUROLOGIC A:   Acute encephalopathy, resolved Hx of polysubstance abuse. P:   Monitor   Adenocarcinoma R Lung A: XRT by Dr Mitzi Hansen. She missed most recent appt- lack of transportation.  P: Emphasize importance of rescheduling and maintaining that f/u  TODAY'S SUMMARY:  Ready for discharge home. F/u with her PCP ? Outpatient clinic, with Rad Onc Dr Mitzi Hansen, with TP/ [name] 12/18.  CD Maple Hudson, MD PCCM

## 2013-09-16 NOTE — Telephone Encounter (Signed)
lmomtcb x1 for pt 

## 2013-10-01 ENCOUNTER — Other Ambulatory Visit (HOSPITAL_COMMUNITY): Payer: Self-pay | Admitting: Internal Medicine

## 2013-11-18 ENCOUNTER — Emergency Department (HOSPITAL_COMMUNITY): Payer: Medicare Other

## 2013-11-18 ENCOUNTER — Inpatient Hospital Stay (HOSPITAL_COMMUNITY)
Admission: EM | Admit: 2013-11-18 | Discharge: 2013-12-01 | DRG: 208 | Disposition: E | Payer: Medicare Other | Attending: Pulmonary Disease | Admitting: Pulmonary Disease

## 2013-11-18 ENCOUNTER — Inpatient Hospital Stay (HOSPITAL_COMMUNITY): Payer: Medicare Other

## 2013-11-18 ENCOUNTER — Other Ambulatory Visit: Payer: Self-pay | Admitting: Internal Medicine

## 2013-11-18 ENCOUNTER — Encounter (HOSPITAL_COMMUNITY): Payer: Self-pay | Admitting: Emergency Medicine

## 2013-11-18 DIAGNOSIS — C3490 Malignant neoplasm of unspecified part of unspecified bronchus or lung: Secondary | ICD-10-CM | POA: Diagnosis present

## 2013-11-18 DIAGNOSIS — E872 Acidosis, unspecified: Secondary | ICD-10-CM | POA: Diagnosis present

## 2013-11-18 DIAGNOSIS — IMO0002 Reserved for concepts with insufficient information to code with codable children: Secondary | ICD-10-CM

## 2013-11-18 DIAGNOSIS — G931 Anoxic brain damage, not elsewhere classified: Secondary | ICD-10-CM | POA: Diagnosis present

## 2013-11-18 DIAGNOSIS — E875 Hyperkalemia: Secondary | ICD-10-CM | POA: Diagnosis present

## 2013-11-18 DIAGNOSIS — J4489 Other specified chronic obstructive pulmonary disease: Secondary | ICD-10-CM | POA: Diagnosis present

## 2013-11-18 DIAGNOSIS — R4020 Unspecified coma: Secondary | ICD-10-CM | POA: Diagnosis present

## 2013-11-18 DIAGNOSIS — J9620 Acute and chronic respiratory failure, unspecified whether with hypoxia or hypercapnia: Principal | ICD-10-CM | POA: Diagnosis present

## 2013-11-18 DIAGNOSIS — F1410 Cocaine abuse, uncomplicated: Secondary | ICD-10-CM | POA: Diagnosis present

## 2013-11-18 DIAGNOSIS — N824 Other female intestinal-genital tract fistulae: Secondary | ICD-10-CM | POA: Diagnosis present

## 2013-11-18 DIAGNOSIS — J449 Chronic obstructive pulmonary disease, unspecified: Secondary | ICD-10-CM | POA: Diagnosis present

## 2013-11-18 DIAGNOSIS — F1910 Other psychoactive substance abuse, uncomplicated: Secondary | ICD-10-CM

## 2013-11-18 DIAGNOSIS — E162 Hypoglycemia, unspecified: Secondary | ICD-10-CM | POA: Diagnosis present

## 2013-11-18 DIAGNOSIS — C3410 Malignant neoplasm of upper lobe, unspecified bronchus or lung: Secondary | ICD-10-CM

## 2013-11-18 DIAGNOSIS — F17200 Nicotine dependence, unspecified, uncomplicated: Secondary | ICD-10-CM | POA: Diagnosis present

## 2013-11-18 DIAGNOSIS — I469 Cardiac arrest, cause unspecified: Secondary | ICD-10-CM | POA: Diagnosis present

## 2013-11-18 DIAGNOSIS — Z9981 Dependence on supplemental oxygen: Secondary | ICD-10-CM

## 2013-11-18 DIAGNOSIS — R64 Cachexia: Secondary | ICD-10-CM | POA: Diagnosis present

## 2013-11-18 DIAGNOSIS — E440 Moderate protein-calorie malnutrition: Secondary | ICD-10-CM | POA: Diagnosis present

## 2013-11-18 DIAGNOSIS — R0600 Dyspnea, unspecified: Secondary | ICD-10-CM

## 2013-11-18 DIAGNOSIS — I959 Hypotension, unspecified: Secondary | ICD-10-CM | POA: Diagnosis present

## 2013-11-18 DIAGNOSIS — E876 Hypokalemia: Secondary | ICD-10-CM | POA: Diagnosis not present

## 2013-11-18 DIAGNOSIS — Z7982 Long term (current) use of aspirin: Secondary | ICD-10-CM

## 2013-11-18 DIAGNOSIS — F1020 Alcohol dependence, uncomplicated: Secondary | ICD-10-CM | POA: Diagnosis present

## 2013-11-18 DIAGNOSIS — J811 Chronic pulmonary edema: Secondary | ICD-10-CM | POA: Diagnosis present

## 2013-11-18 DIAGNOSIS — J9600 Acute respiratory failure, unspecified whether with hypoxia or hypercapnia: Secondary | ICD-10-CM

## 2013-11-18 DIAGNOSIS — J9612 Chronic respiratory failure with hypercapnia: Secondary | ICD-10-CM

## 2013-11-18 DIAGNOSIS — J441 Chronic obstructive pulmonary disease with (acute) exacerbation: Secondary | ICD-10-CM

## 2013-11-18 DIAGNOSIS — Z66 Do not resuscitate: Secondary | ICD-10-CM | POA: Diagnosis not present

## 2013-11-18 DIAGNOSIS — N179 Acute kidney failure, unspecified: Secondary | ICD-10-CM | POA: Diagnosis not present

## 2013-11-18 DIAGNOSIS — Z923 Personal history of irradiation: Secondary | ICD-10-CM

## 2013-11-18 DIAGNOSIS — I10 Essential (primary) hypertension: Secondary | ICD-10-CM | POA: Diagnosis present

## 2013-11-18 DIAGNOSIS — Z681 Body mass index (BMI) 19 or less, adult: Secondary | ICD-10-CM

## 2013-11-18 DIAGNOSIS — F1210 Cannabis abuse, uncomplicated: Secondary | ICD-10-CM | POA: Diagnosis present

## 2013-11-18 DIAGNOSIS — G40401 Other generalized epilepsy and epileptic syndromes, not intractable, with status epilepticus: Secondary | ICD-10-CM | POA: Diagnosis present

## 2013-11-18 DIAGNOSIS — Z515 Encounter for palliative care: Secondary | ICD-10-CM

## 2013-11-18 LAB — PRO B NATRIURETIC PEPTIDE: Pro B Natriuretic peptide (BNP): 1861 pg/mL — ABNORMAL HIGH (ref 0–125)

## 2013-11-18 LAB — URINALYSIS, ROUTINE W REFLEX MICROSCOPIC
Bilirubin Urine: NEGATIVE
GLUCOSE, UA: NEGATIVE mg/dL
Ketones, ur: NEGATIVE mg/dL
Leukocytes, UA: NEGATIVE
Nitrite: NEGATIVE
PROTEIN: NEGATIVE mg/dL
Specific Gravity, Urine: 1.026 (ref 1.005–1.030)
Urobilinogen, UA: 0.2 mg/dL (ref 0.0–1.0)
pH: 5 (ref 5.0–8.0)

## 2013-11-18 LAB — CBC WITH DIFFERENTIAL/PLATELET
BASOS ABS: 0 10*3/uL (ref 0.0–0.1)
Basophils Relative: 0 % (ref 0–1)
Eosinophils Absolute: 0.1 10*3/uL (ref 0.0–0.7)
Eosinophils Relative: 1 % (ref 0–5)
HCT: 44.9 % (ref 36.0–46.0)
Hemoglobin: 14.1 g/dL (ref 12.0–15.0)
Lymphocytes Relative: 41 % (ref 12–46)
Lymphs Abs: 3 10*3/uL (ref 0.7–4.0)
MCH: 30.2 pg (ref 26.0–34.0)
MCHC: 31.4 g/dL (ref 30.0–36.0)
MCV: 96.1 fL (ref 78.0–100.0)
MONO ABS: 0.8 10*3/uL (ref 0.1–1.0)
Monocytes Relative: 11 % (ref 3–12)
NEUTROS ABS: 3.4 10*3/uL (ref 1.7–7.7)
Neutrophils Relative %: 47 % (ref 43–77)
PLATELETS: 260 10*3/uL (ref 150–400)
RBC: 4.67 MIL/uL (ref 3.87–5.11)
RDW: 18.7 % — AB (ref 11.5–15.5)
WBC: 7.3 10*3/uL (ref 4.0–10.5)

## 2013-11-18 LAB — POCT I-STAT 3, ART BLOOD GAS (G3+)
ACID-BASE DEFICIT: 4 mmol/L — AB (ref 0.0–2.0)
Acid-base deficit: 18 mmol/L — ABNORMAL HIGH (ref 0.0–2.0)
Acid-base deficit: 12 mmol/L — ABNORMAL HIGH (ref 0.0–2.0)
BICARBONATE: 13.2 meq/L — AB (ref 20.0–24.0)
Bicarbonate: 13.3 mEq/L — ABNORMAL LOW (ref 20.0–24.0)
Bicarbonate: 19.2 mEq/L — ABNORMAL LOW (ref 20.0–24.0)
O2 Saturation: 100 %
O2 Saturation: 99 %
O2 Saturation: 98 %
PCO2 ART: 23.7 mmHg — AB (ref 35.0–45.0)
PCO2 ART: 26.2 mmHg — AB (ref 35.0–45.0)
PO2 ART: 123 mmHg — AB (ref 80.0–100.0)
Patient temperature: 33.4
Patient temperature: 92
TCO2: 15 mmol/L (ref 0–100)
TCO2: 14 mmol/L (ref 0–100)
TCO2: 20 mmol/L (ref 0–100)
pCO2 arterial: 49.8 mmHg — ABNORMAL HIGH (ref 35.0–45.0)
pH, Arterial: 7.028 — CL (ref 7.350–7.450)
pH, Arterial: 7.335 — ABNORMAL LOW (ref 7.350–7.450)
pH, Arterial: 7.456 — ABNORMAL HIGH (ref 7.350–7.450)
pO2, Arterial: 388 mmHg — ABNORMAL HIGH (ref 80.0–100.0)
pO2, Arterial: 77 mmHg — ABNORMAL LOW (ref 80.0–100.0)

## 2013-11-18 LAB — COMPREHENSIVE METABOLIC PANEL
ALBUMIN: 2.5 g/dL — AB (ref 3.5–5.2)
ALT: 19 U/L (ref 0–35)
AST: 70 U/L — AB (ref 0–37)
Alkaline Phosphatase: 157 U/L — ABNORMAL HIGH (ref 39–117)
BUN: 10 mg/dL (ref 6–23)
CO2: 12 mEq/L — ABNORMAL LOW (ref 19–32)
CREATININE: 0.61 mg/dL (ref 0.50–1.10)
Calcium: 8.2 mg/dL — ABNORMAL LOW (ref 8.4–10.5)
Chloride: 91 mEq/L — ABNORMAL LOW (ref 96–112)
GFR calc Af Amer: >90 mL/min (ref >90)
GFR calc non Af Amer: >90 mL/min (ref >90)
Glucose, Bld: 168 mg/dL — ABNORMAL HIGH (ref 70–99)
Potassium: 6.1 mEq/L — ABNORMAL HIGH (ref 3.7–5.3)
Sodium: 131 mEq/L — ABNORMAL LOW (ref 137–147)
TOTAL PROTEIN: 6.3 g/dL (ref 6.0–8.3)

## 2013-11-18 LAB — TYPE AND SCREEN
ABO/RH(D): B POS
ANTIBODY SCREEN: NEGATIVE

## 2013-11-18 LAB — POCT I-STAT, CHEM 8
BUN: 13 mg/dL (ref 6–23)
BUN: 10 mg/dL (ref 6–23)
BUN: 11 mg/dL (ref 6–23)
CALCIUM ION: 1.08 mmol/L — AB (ref 1.12–1.23)
CALCIUM ION: 1.09 mmol/L — AB (ref 1.12–1.23)
CREATININE: 0.6 mg/dL (ref 0.50–1.10)
CREATININE: 0.5 mg/dL (ref 0.50–1.10)
Calcium, Ion: 1.04 mmol/L — ABNORMAL LOW (ref 1.12–1.23)
Chloride: 99 mEq/L (ref 96–112)
Chloride: 102 mEq/L (ref 96–112)
Chloride: 99 mEq/L (ref 96–112)
Creatinine, Ser: 0.9 mg/dL (ref 0.50–1.10)
Glucose, Bld: 163 mg/dL — ABNORMAL HIGH (ref 70–99)
Glucose, Bld: 132 mg/dL — ABNORMAL HIGH (ref 70–99)
Glucose, Bld: 127 mg/dL — ABNORMAL HIGH (ref 70–99)
HCT: 48 % — ABNORMAL HIGH (ref 36.0–46.0)
HCT: 49 % — ABNORMAL HIGH (ref 36.0–46.0)
HEMATOCRIT: 50 % — AB (ref 36.0–46.0)
HEMOGLOBIN: 17 g/dL — AB (ref 12.0–15.0)
HEMOGLOBIN: 16.3 g/dL — AB (ref 12.0–15.0)
Hemoglobin: 16.7 g/dL — ABNORMAL HIGH (ref 12.0–15.0)
POTASSIUM: 5.4 meq/L — AB (ref 3.7–5.3)
POTASSIUM: 3.2 meq/L — AB (ref 3.7–5.3)
Potassium: 4.1 mEq/L (ref 3.7–5.3)
SODIUM: 134 meq/L — AB (ref 137–147)
Sodium: 131 mEq/L — ABNORMAL LOW (ref 137–147)
Sodium: 136 mEq/L — ABNORMAL LOW (ref 137–147)
TCO2: 15 mmol/L (ref 0–100)
TCO2: 17 mmol/L (ref 0–100)
TCO2: 19 mmol/L (ref 0–100)

## 2013-11-18 LAB — CBC
HCT: 41.7 % (ref 36.0–46.0)
HEMOGLOBIN: 13.8 g/dL (ref 12.0–15.0)
MCH: 30.3 pg (ref 26.0–34.0)
MCHC: 33.1 g/dL (ref 30.0–36.0)
MCV: 91.6 fL (ref 78.0–100.0)
Platelets: 233 10*3/uL (ref 150–400)
RBC: 4.55 MIL/uL (ref 3.87–5.11)
RDW: 18.2 % — ABNORMAL HIGH (ref 11.5–15.5)
WBC: 10.3 10*3/uL (ref 4.0–10.5)

## 2013-11-18 LAB — BASIC METABOLIC PANEL
BUN: 10 mg/dL (ref 6–23)
BUN: 13 mg/dL (ref 6–23)
CHLORIDE: 100 meq/L (ref 96–112)
CO2: 11 mEq/L — ABNORMAL LOW (ref 19–32)
CO2: 16 mEq/L — ABNORMAL LOW (ref 19–32)
CREATININE: 0.48 mg/dL — AB (ref 0.50–1.10)
Calcium: 7.4 mg/dL — ABNORMAL LOW (ref 8.4–10.5)
Calcium: 7.4 mg/dL — ABNORMAL LOW (ref 8.4–10.5)
Chloride: 96 mEq/L (ref 96–112)
Creatinine, Ser: 0.6 mg/dL (ref 0.50–1.10)
GFR calc non Af Amer: >90 mL/min (ref >90)
Glucose, Bld: 137 mg/dL — ABNORMAL HIGH (ref 70–99)
Glucose, Bld: 119 mg/dL — ABNORMAL HIGH (ref 70–99)
Potassium: 4.3 mEq/L (ref 3.7–5.3)
Potassium: 3.3 mEq/L — ABNORMAL LOW (ref 3.7–5.3)
SODIUM: 134 meq/L — AB (ref 137–147)
SODIUM: 136 meq/L — AB (ref 137–147)

## 2013-11-18 LAB — POCT I-STAT TROPONIN I: Troponin i, poc: 0.04 ng/mL (ref 0.00–0.08)

## 2013-11-18 LAB — PROTIME-INR
INR: 1.17 (ref 0.00–1.49)
INR: 1.1 (ref 0.00–1.49)
PROTHROMBIN TIME: 14 s (ref 11.6–15.2)
Prothrombin Time: 14.7 seconds (ref 11.6–15.2)

## 2013-11-18 LAB — URINE MICROSCOPIC-ADD ON

## 2013-11-18 LAB — MRSA PCR SCREENING: MRSA by PCR: NEGATIVE

## 2013-11-18 LAB — STREP PNEUMONIAE URINARY ANTIGEN: STREP PNEUMO URINARY ANTIGEN: NEGATIVE

## 2013-11-18 LAB — TROPONIN I
Troponin I: <0.3 ng/mL (ref <0.30)
Troponin I: <0.3 ng/mL (ref <0.30)

## 2013-11-18 LAB — APTT
aPTT: 28 seconds (ref 24–37)
aPTT: 32 seconds (ref 24–37)

## 2013-11-18 LAB — LACTIC ACID, PLASMA: Lactic Acid, Venous: 6.5 mmol/L — ABNORMAL HIGH (ref 0.5–2.2)

## 2013-11-18 LAB — MAGNESIUM: MAGNESIUM: 1.4 mg/dL — AB (ref 1.5–2.5)

## 2013-11-18 LAB — ABO/RH: ABO/RH(D): B POS

## 2013-11-18 LAB — CG4 I-STAT (LACTIC ACID): LACTIC ACID, VENOUS: 13.16 mmol/L — AB (ref 0.5–2.2)

## 2013-11-18 LAB — PHOSPHORUS: Phosphorus: 3.6 mg/dL (ref 2.3–4.6)

## 2013-11-18 MED ORDER — CISATRACURIUM BOLUS VIA INFUSION
0.0500 mg/kg | INTRAVENOUS | Status: DC | PRN
Start: 2013-11-18 — End: 2013-11-20
  Filled 2013-11-18: qty 3

## 2013-11-18 MED ORDER — NOREPINEPHRINE BITARTRATE 1 MG/ML IJ SOLN
0.5000 ug/min | INTRAVENOUS | Status: DC
  Administered 2013-11-18: 1 ug/min via INTRAVENOUS
  Administered 2013-11-20: 12 ug/min via INTRAVENOUS
  Administered 2013-11-20: 2 ug/min via INTRAVENOUS
  Administered 2013-11-20 (×2): 12 ug/min via INTRAVENOUS
  Administered 2013-11-21: 14 ug/min via INTRAVENOUS
  Filled 2013-11-18 (×7): qty 4

## 2013-11-18 MED ORDER — FENTANYL BOLUS VIA INFUSION
50.0000 ug | INTRAVENOUS | Status: DC | PRN
  Filled 2013-11-18: qty 50

## 2013-11-18 MED ORDER — PANTOPRAZOLE SODIUM 40 MG IV SOLR
40.0000 mg | Freq: Every day | INTRAVENOUS | Status: DC
  Administered 2013-11-18 – 2013-11-21 (×5): 40 mg via INTRAVENOUS
  Filled 2013-11-18 (×6): qty 40

## 2013-11-18 MED ORDER — MIDAZOLAM BOLUS VIA INFUSION
2.0000 mg | INTRAVENOUS | Status: DC | PRN
  Filled 2013-11-18: qty 2

## 2013-11-18 MED ORDER — MIDAZOLAM HCL 2 MG/2ML IJ SOLN
INTRAMUSCULAR | Status: AC
  Filled 2013-11-18: qty 2

## 2013-11-18 MED ORDER — CHLORHEXIDINE GLUCONATE 0.12 % MT SOLN
15.0000 mL | Freq: Two times a day (BID) | OROMUCOSAL | Status: DC
  Administered 2013-11-18 – 2013-11-22 (×8): 15 mL via OROMUCOSAL
  Filled 2013-11-18 (×8): qty 15

## 2013-11-18 MED ORDER — SODIUM CHLORIDE 0.9 % IV SOLN
1.0000 ug/kg/min | INTRAVENOUS | Status: DC
  Filled 2013-11-18: qty 20

## 2013-11-18 MED ORDER — BIOTENE DRY MOUTH MT LIQD
15.0000 mL | Freq: Four times a day (QID) | OROMUCOSAL | Status: DC
  Administered 2013-11-18 – 2013-11-22 (×14): 15 mL via OROMUCOSAL

## 2013-11-18 MED ORDER — MIDAZOLAM HCL 5 MG/ML IJ SOLN: 2.0000 mg | Freq: Once | INTRAMUSCULAR | Status: DC | PRN

## 2013-11-18 MED ORDER — MIDAZOLAM HCL 5 MG/ML IJ SOLN: 2.0000 mg | Freq: Once | INTRAMUSCULAR | Status: DC

## 2013-11-18 MED ORDER — POTASSIUM CHLORIDE 10 MEQ/100ML IV SOLN
10.0000 meq | INTRAVENOUS | Status: AC
  Administered 2013-11-18 – 2013-11-19 (×4): 10 meq via INTRAVENOUS
  Filled 2013-11-18: qty 100

## 2013-11-18 MED ORDER — CISATRACURIUM BOLUS VIA INFUSION
0.1000 mg/kg | Freq: Once | INTRAVENOUS | Status: DC
  Filled 2013-11-18: qty 5

## 2013-11-18 MED ORDER — ARTIFICIAL TEARS OP OINT
1.0000 "application " | TOPICAL_OINTMENT | Freq: Three times a day (TID) | OPHTHALMIC | Status: DC
  Administered 2013-11-18 – 2013-11-20 (×5): 1 via OPHTHALMIC
  Filled 2013-11-18: qty 3.5

## 2013-11-18 MED ORDER — CISATRACURIUM BESYLATE 10 MG/ML IV SOLN
1.0000 ug/kg/min | INTRAVENOUS | Status: DC
  Administered 2013-11-18: 1 ug/kg/min via INTRAVENOUS
  Filled 2013-11-18: qty 20

## 2013-11-18 MED ORDER — SODIUM CHLORIDE 0.9 % IV SOLN
2000.0000 mL | Freq: Once | INTRAVENOUS | Status: AC
  Administered 2013-11-18: 2000 mL via INTRAVENOUS

## 2013-11-18 MED ORDER — MIDAZOLAM HCL 5 MG/ML IJ SOLN
1.0000 mg/h | INTRAMUSCULAR | Status: DC
  Administered 2013-11-18: 3 mg/h via INTRAVENOUS
  Administered 2013-11-19: 4 mg/h via INTRAVENOUS
  Administered 2013-11-20: 3 mg/h via INTRAVENOUS
  Filled 2013-11-18 (×4): qty 10

## 2013-11-18 MED ORDER — SODIUM CHLORIDE 0.9 % IV SOLN
INTRAVENOUS | Status: DC
  Administered 2013-11-18: 1000 mL via INTRAVENOUS
  Administered 2013-11-20: 50 mL via INTRAVENOUS

## 2013-11-18 MED ORDER — ASPIRIN 300 MG RE SUPP: 300.0000 mg | RECTAL | Status: DC

## 2013-11-18 MED ORDER — FENTANYL CITRATE 0.05 MG/ML IJ SOLN: 100.0000 ug | Freq: Once | INTRAMUSCULAR | Status: DC | PRN

## 2013-11-18 MED ORDER — SODIUM CHLORIDE 0.9 % IV SOLN
25.0000 ug/h | INTRAVENOUS | Status: DC
  Administered 2013-11-18: 125 ug/h via INTRAVENOUS
  Administered 2013-11-19 (×2): 200 ug/h via INTRAVENOUS
  Filled 2013-11-18 (×5): qty 50

## 2013-11-18 MED ORDER — FENTANYL CITRATE 0.05 MG/ML IJ SOLN: 100.0000 ug | Freq: Once | INTRAMUSCULAR | Status: DC

## 2013-11-18 MED ORDER — FENTANYL CITRATE 0.05 MG/ML IJ SOLN
100.0000 ug | Freq: Once | INTRAMUSCULAR | Status: AC | PRN
Start: 2013-11-18 — End: 2013-11-18

## 2013-11-18 MED ORDER — MIDAZOLAM HCL 2 MG/2ML IJ SOLN
2.0000 mg | Freq: Once | INTRAMUSCULAR | Status: AC | PRN
Start: 2013-11-18 — End: 2013-11-18

## 2013-11-18 MED ORDER — IOHEXOL 350 MG/ML SOLN: 100.0000 mL | Freq: Once | INTRAVENOUS | Status: AC | PRN

## 2013-11-18 MED ORDER — FENTANYL CITRATE 0.05 MG/ML IJ SOLN: 100.0000 ug | Freq: Once | INTRAMUSCULAR | Status: AC

## 2013-11-18 MED ORDER — CISATRACURIUM BOLUS VIA INFUSION
0.1000 mg/kg | Freq: Once | INTRAVENOUS | Status: AC
  Administered 2013-11-18: 4.4 mg via INTRAVENOUS
  Filled 2013-11-18: qty 5

## 2013-11-18 MED ORDER — SODIUM CHLORIDE 0.9 % IV SOLN
25.0000 ug/h | INTRAVENOUS | Status: DC
  Administered 2013-11-18: 50 ug/h via INTRAVENOUS
  Filled 2013-11-18: qty 50

## 2013-11-18 MED ORDER — HEPARIN SODIUM (PORCINE) 5000 UNIT/ML IJ SOLN
5000.0000 [IU] | Freq: Three times a day (TID) | INTRAMUSCULAR | Status: DC
  Filled 2013-11-18 (×2): qty 1

## 2013-11-18 MED ORDER — SODIUM CHLORIDE 0.9 % IV SOLN
1.0000 mg/h | INTRAVENOUS | Status: DC
  Administered 2013-11-18: 2 mg/h via INTRAVENOUS
  Filled 2013-11-18: qty 10

## 2013-11-18 MED ORDER — FENTANYL CITRATE 0.05 MG/ML IJ SOLN
INTRAMUSCULAR | Status: AC
  Administered 2013-11-18: 100 ug
  Filled 2013-11-18: qty 2

## 2013-11-18 MED ORDER — MIDAZOLAM HCL 2 MG/2ML IJ SOLN
2.0000 mg | Freq: Once | INTRAMUSCULAR | Status: DC
  Administered 2013-11-18: 2 mg via INTRAVENOUS

## 2013-11-18 MED ORDER — CISATRACURIUM BOLUS VIA INFUSION
0.0500 mg/kg | INTRAVENOUS | Status: DC | PRN
  Filled 2013-11-18: qty 3

## 2013-11-18 MED ORDER — ASPIRIN 300 MG RE SUPP
300.0000 mg | RECTAL | Status: AC
  Administered 2013-11-18: 300 mg via RECTAL
  Filled 2013-11-18: qty 1

## 2013-11-18 MED ORDER — INSULIN ASPART 100 UNIT/ML ~~LOC~~ SOLN: 0.0000 [IU] | SUBCUTANEOUS | Status: DC

## 2013-11-18 MED ORDER — MIDAZOLAM HCL 2 MG/2ML IJ SOLN: 2.0000 mg | Freq: Once | INTRAMUSCULAR | Status: AC

## 2013-11-18 NOTE — H&P (Signed)
PULMONARY / CRITICAL CARE MEDICINE  Name: Karina Weber MRN: [id number] DOB: 1956-02-09    ADMISSION DATE:  11/16/2013 CONSULTATION DATE:  11/10/2013  REFERRING MD :  EDP PRIMARY SERVICE: PCCM  CHIEF COMPLAINT:  PEA Arrest  BRIEF PATIENT DESCRIPTION: 58 year old female presented 2/16 after being found down in PEA arrest by family. ROSC with CPR and epi x 2.   SIGNIFICANT EVENTS / STUDIES:  2/16  PEA arrest, ~20 mins downtime, ROSC with epi x 2  2/16  Head CT >>> 2/16  Chest CTA >>>  LINES / TUBES: OETT 2/16 >>> OGT 2/16 >>> L [place] TLC 2/16 >>> L rad a-line 2/16 >>>  CULTURES: Blood 2/16 >>> Urine 2/16 >>> Respiratory 2/16 >>>  ANTIBIOTICS:  The patient is encephalopathic and unable to provide history, which was obtained for available medical records.  HISTORY OF PRESENT ILLNESS:  58 year old female presented 2/16 after being found down in PEA arrest by family. ROSC with CPR and epi x 2.   PAST MEDICAL HISTORY :  Past Medical History  Diagnosis Date  . Tobacco abuse   . COPD (chronic obstructive pulmonary disease)     exacerbagtion in 6/12 and in 2010.   [place] Kitchen Hypertension   . History of radiation therapy 07/19/10 to 07/28/10    RUL lung  . 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 Alcohol abuse     2 beers a day  . Asthma   . Headache(784.0)   . Chronic hypercapnic respiratory failure   . Polysubstance abuse   . Edema 05/31/13    leg swelling,  . Supplemental oxygen dependent     sob    Past Surgical History  Procedure Laterality Date  . No past surgeries     Prior to Admission medications   Medication Sig Start Date End Date Taking? Authorizing Provider  ADVAIR DISKUS 500-50 MCG/DOSE AEPB INHALE 1 PUFF INTO THE LUNGS EVERY 12 (TWELVE) HOURS. 10/01/13   Joni Reining, DO  albuterol (PROVENTIL) (5 MG/ML) 0.5% nebulizer solution Take 0.5 mLs (2.5 mg total) by nebulization every 6 (six) hours as needed for wheezing or shortness of breath. 06/12/13   Otho Bellows, MD  albuterol (VENTOLIN HFA) 108 (90 BASE) MCG/ACT inhaler Inhale 2 puffs into the lungs every 4 (four) hours as needed for wheezing or shortness of breath. 07/29/13   Joni Reining, DO  aspirin EC 81 MG tablet Take 81 mg by mouth daily.    Historical Provider, MD  buPROPion (WELLBUTRIN) 100 MG tablet Take 1 tablet (100 mg total) by mouth 3 (three) times daily. Start with 100mg  BID for 3 days, then continue with 100mg  TID. 06/17/13 06/17/14  Joni Reining, DO  doxycycline (VIBRA-TABS) 100 MG tablet 1 tab po q12 hours x 3 days 09/14/13   Marijean Heath, NP  gabapentin (NEURONTIN) 300 MG capsule Take 1 capsule (300 mg total) by mouth at bedtime. 06/17/13 06/17/14  Joni Reining, DO  hydrochlorothiazide (HYDRODIURIL) 25 MG tablet Take 1 tablet (25 mg total) by mouth daily. 06/17/13   Joni Reining, DO  ipratropium (ATROVENT) 0.02 % nebulizer solution Take 2.5 mLs (0.5 mg total) by nebulization every 6 (six) hours. 01/18/13   Tonia Brooms, MD  losartan (COZAAR) 50 MG tablet Take 1 tablet (50 mg total) by mouth daily. 06/17/13 06/17/14  Joni Reining, DO  predniSONE (DELTASONE) 20 MG tablet 2 tabs po daily x 5 days then 1 tab po daily until otherwise  directed at office visit 12/18 09/14/13   Marijean Heath, NP   Allergies  Allergen Reactions  . Lisinopril Cough   FAMILY HISTORY:  Family History  Problem Relation Age of Onset  . Heart disease Neg Hx   . Cancer Mother     lung  . Cancer Father     lung  . Hypertension     SOCIAL HISTORY:  reports that she has been smoking Cigarettes.  She has a 8 pack-year smoking history. She has never used smokeless tobacco. She reports that she drinks alcohol. She reports that she uses illicit drugs (Cocaine and Marijuana).  REVIEW OF SYSTEMS:  Unable due to intubated/encephalopathic  SUBJECTIVE:   VITAL SIGNS: Temp:  [91.6 F (33.1 C)-96.4 F (35.8 C)] 91.8 F (33.2 C) (02/16 1800) Pulse Rate:  [67-93] 82 (02/16 1811) Resp:  [14-32] 30  (02/16 1811) BP: (101-193)/(72-98) 101/72 mmHg (02/16 1811) SpO2:  [99 %-100 %] 99 % (02/16 1811) Arterial Line BP: (96-118)/(79-93) 96/93 mmHg (02/16 1800) FiO2 (%):  [40 %-100 %] 40 % (02/16 1811) Weight:  [44.1 kg (97 lb 3.6 oz)-48 kg (105 lb 13.1 oz)] 48 kg (105 lb 13.1 oz) (02/16 1744)  HEMODYNAMICS:   VENTILATOR SETTINGS: Vent Mode:  [-] PRVC FiO2 (%):  [40 %-100 %] 40 % Set Rate:  [16 bmp-30 bmp] 30 bmp Vt Set:  [480 mL] 480 mL PEEP:  [5 cmH20] 5 cmH20 Plateau Pressure:  [26 cmH20] 26 cmH20  INTAKE / OUTPUT: Intake/Output     02/15 0701 - 02/16 0700 02/16 0701 - 02/17 0700   I.V. (mL/kg)  672.2 (14)   Total Intake(mL/kg)  672.2 (14)   Urine (mL/kg/hr)  900   Total Output   900   Net   -227.9          PHYSICAL EXAMINATION: General:  Intubated, sedated female of normal body habitus Neuro:  Unresponsive, does not follow commands.  HEENT:  [place]/AT, pupils pinpoint.  Cardiovascular:  RRR Lungs:  No wheeze Abdomen:  Soft, non-distended Musculoskeletal:  Intact Skin:  Intact  LABS:  CBC  Recent Labs Lab 11/25/2013 1443 11/03/2013 1500  WBC 7.3  --   HGB 14.1 17.0*  HCT 44.9 50.0*  PLT 260  --    Coag's  Recent Labs Lab 11/20/2013 1443  APTT 28  INR 1.17   BMET  Recent Labs Lab 11/11/2013 1443 11/29/2013 1500  NA 131* 131*  K 6.1* 5.4*  CL 91* 99  CO2 12*  --   BUN 10 13  CREATININE 0.61 0.90  GLUCOSE 168* 163*   Electrolytes  Recent Labs Lab 11/14/2013 1443  CALCIUM 8.2*   Sepsis Markers  Recent Labs Lab 11/09/2013 1500  LATICACIDVEN 13.16*   ABG  Recent Labs Lab 11/10/2013 1613  PHART 7.028*  PCO2ART 49.8*  PO2ART 388.0*   Liver Enzymes  Recent Labs Lab 11/17/2013 1443  AST 70*  ALT 19  ALKPHOS 157*  BILITOT <0.2*  ALBUMIN 2.5*   Cardiac Enzymes No results found for this basename: TROPONINI, PROBNP,  in the last 168 hours  Glucose No results found for this basename: GLUCAP,  in the last 168 hours  Imaging  CXR: 2/16 >>>  Hardware in good position, no overt airspace disease   ASSESSMENT / PLAN:  PULMONARY A: Acute respiratory failure in setting of cardiac arrests Possible pulmonary embolism given cardia arrest and history of malignancy COPD without evidence of exacerbation Lung Ca s/p XRT P:   Goal pH>7.30, SpO2>92  Continuous mechanical support, high Ve VAP bundle Hold SBT while on Nimbex Trend ABG/CXR Albuterol / Atrovent No indication for systemic steroids Chest CTA: r/o PE and evaluate for lung mass  CARDIOVASCULAR A:  PEA cardiac arrest in middle age female without cardiac history and no EKG changes - doubt primary cardiac etiology Normotensive P:  Goal MAP > 65 Levophed prn Trend lactate / troponin Defer interventional cardiac evaluation ( also received IV contrast for CTA ) TTE  RENAL A:   Hyperkalemia in setting of acute acidosis, improved P:   Trend BMP Fluids per Hypothermia protocol  GASTROINTESTINAL / GU A:   Fecal material from vagina when Foley is attempted? Rectovaginal fistula? Nutrition GI Px P:   Protonix TF per Nutritionist when off paralysis   Pelvic CT with rectal contrast when stable  HEMATOLOGIC A:   Hemoconcentration P:  Trend CBC APTT / INR SCDs  INFECTIOUS A:   No evidence of overt infection P:   Panculture Observe off abx  ENDOCRINE A:   Hyperglycemia  P:   SSI  NEUROLOGIC A:   Acute encephalopathy Possible anoxia Hx polysubstance abuse P:   Hypothermia protocol Fentanyl / Versed / Nimbex Head CT Drug screen  I have personally obtained history, examined patient, evaluated and interpreted laboratory and imaging results, reviewed medical records, formulated assessment / plan and placed orders.  CRITICAL CARE:  The patient is critically ill with multiple organ systems failure and requires high complexity decision making for assessment and support, frequent evaluation and titration of therapies, application of advanced monitoring  technologies and extensive interpretation of multiple databases. Critical Care Time devoted to patient care services described in this note is 60 minutes.   Doree Fudge, MD Pulmonary and [place] Pager: [phone]  11/13/2013, 7:00 PM

## 2013-11-18 NOTE — Procedures (Signed)
Arterial Catheter Insertion Procedure Note Karina Weber [id number] April 28, 1956  Procedure: Insertion of Arterial Catheter  Indications: Blood pressure monitoring and Frequent blood sampling  Procedure Details Consent: Unable to obtain consent because of altered level of consciousness. Time Out: Verified patient identification, verified procedure, site/side was marked, verified correct patient position, special equipment/implants available, medications/allergies/relevent history reviewed, required imaging and test results available.  Performed  Maximum sterile technique was used including antiseptics, cap, gloves, hand hygiene, mask and sheet. Skin prep: Chlorhexidine; local anesthetic administered 20 gauge catheter was inserted into left radial artery using the Seldinger technique.  Evaluation Blood flow good; BP tracing good. Complications: No apparent complications.   Georgann Housekeeper, ACNP [facility] Pulmonology/Critical Care Pager [phone] or [phone]

## 2013-11-18 NOTE — ED Provider Notes (Signed)
Patient seen/examined in the Emergency Department in conjunction with Resident Physician Provider [place] Patient presents s/p cardiac arrest with return of spontaneous circulation Exam : pt is unresponsive.  She has been intubated by EMS.  She is currently hypertensive Plan: admit to ICU   Sharyon Cable, MD 11/04/2013 1600

## 2013-11-18 NOTE — ED Notes (Signed)
ekg delayed.  Monitor in trauma b not working.  Portable in the hall- broken.  Borrowed portable from triage.

## 2013-11-18 NOTE — ED Notes (Signed)
Pt found down by family at home pulseless and apneic. Seen normal approx 10 prior normal. Had CPR started by family. PEA on EMS arrival. 2 EPI given. 8.0 ETT. 18g RFA. cbg 256. Last bp 124/67.

## 2013-11-18 NOTE — Procedures (Signed)
Name:  [name] MRN:  [id number] DOB:  10-31-1955  PROCEDURE NOTE  Procedure:  Central venous catheter placement.  Indications:  Need for intravenous access and hemodynamic monitoring.  Consent:  Consent was implied due to the emergency nature of the procedure.  Anesthesia:  A total of 10 mL of 1% Lidocaine was used for local infiltration anesthesia.  Procedure summary:  Appropriate equipment was assembled.  The patient was identified as Karina Weber and safety timeout was performed. The patient was placed in Trendelenburg position.  Sterile technique was used. The patient's left anterior chest wall was prepped using chlorhexidine / alcohol scrub and the field was draped in usual sterile fashion with full body drape. After the adequate anesthesia was achieved, the left subclavian vein was cannulated with the introducer needle without difficulty. A guide wire was advanced through the introducer needle, which was then withdrawn. A small skin incision was made at the point of wire entry, the dilator was inserted over the guide wire and appropriate dilation was obtained. The dilator was removed and triple-lumen catheter was advanced over the guide wire, which was then removed.  All ports were aspirated and flushed with normal saline without difficulty. The catheter was secured into place. Antibiotic patch was placed and sterile dressing was applied. Post-procedure chest x-ray was ordered.  Complications:  No immediate complications were noted.  Hemodynamic parameters and oxygenation remained stable throughout the procedure.  Estimated blood loss:  Less then 5 mL.  Doree Fudge, MD Pulmonary and [place] Cell: [phone]  11/03/2013, 4:42 PM

## 2013-11-18 NOTE — Progress Notes (Signed)
Name: Karina Weber MRN: [id number] DOB: 1956-04-01  ELECTRONIC ICU PHYSICIAN NOTE  Problem:  Hypokalemia   Recent Labs Lab 11/29/2013 1443  11/10/2013 1800 11/28/2013 2038 11/21/2013 2220  NA 131*  < > 134* 134* 136*  K 6.1*  < > 4.3 4.1 3.2*  CL 91*  < > 96 102 99  CO2 12*  --  11*  --   --   BUN 10  < > 10 10 11   CREATININE 0.61  < > 0.60 0.60 0.50  GLUCOSE 168*  < > 137* 132* 127*  < > = values in this interval not displayed.  Recent Labs Lab 11/25/2013 1443  11/03/2013 1800 11/26/2013 2038 11/12/2013 2220  HGB 14.1  < > 13.8 16.3* 16.7*  HCT 44.9  < > 41.7 48.0* 49.0*  WBC 7.3  --  10.3  --   --   PLT 260  --  233  --   --   < > = values in this interval not displayed.      Intervention:  KCl 10 meq per hour x 4 runs  Christinia Gully 11/04/2013, 10:29 PM

## 2013-11-18 NOTE — ED Provider Notes (Signed)
CSN: [id number]     Arrival date & time 11/12/2013  1435 History   First MD Initiated Contact with Patient 11/27/2013 1440     Chief Complaint  Patient presents with  . Cardiac Arrest     HPI  58 yo F presents via EMS for cardiac arrest. Per report patient was found down unresponsive. CPR was started by family. On arrival EMS noted PEA. 2 rounds of epi given with CPR. ROSC achieved. Patient was intubated on scene by EMS. On arrival patient with strong carotid pulse but hypotensive. Patient is moving her RUE only. Pupils 4 mm and minimally reactive. No further hx available at this time due to pts mental state/intubation.   Past Medical History  Diagnosis Date  . Tobacco abuse   . COPD (chronic obstructive pulmonary disease)     exacerbagtion in 6/12 and in 2010.   Marland Kitchen Hypertension   . History of radiation therapy 07/19/10 to 07/28/10    RUL lung  . 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 Alcohol abuse     2 beers a day  . Asthma   . Headache(784.0)   . Chronic hypercapnic respiratory failure   . Polysubstance abuse   . Edema 05/31/13    leg swelling,  . Supplemental oxygen dependent     sob    Past Surgical History  Procedure Laterality Date  . No past surgeries     Family History  Problem Relation Age of Onset  . Heart disease Neg Hx   . Cancer Mother     lung  . Cancer Father     lung  . Hypertension     History  Substance Use Topics  . Smoking status: Current Some Day Smoker -- 0.20 packs/day for 40 years    Types: Cigarettes  . Smokeless tobacco: Never Used     Comment: PATIENT STATE SHE ONLY SMOKES ONLY 3 CIGARETRTES A DAYCut back from 2 ppd when diagnosed with Lung cancer.12/02/11 > 3 cig/day 07/20/12  . Alcohol Use: No     Comment: Former alcoholic   OB History   Grav Para Term Preterm Abortions TAB SAB Ect Mult Living                 Review of Systems  Unable to perform ROS: Intubated      Allergies  Lisinopril  Home Medications   Current  Outpatient Rx  Name  Route  Sig  Dispense  Refill  . ADVAIR DISKUS 500-50 MCG/DOSE AEPB      INHALE 1 PUFF INTO THE LUNGS EVERY 12 (TWELVE) HOURS.   60 each   5   . albuterol (PROVENTIL) (5 MG/ML) 0.5% nebulizer solution   Nebulization   Take 0.5 mLs (2.5 mg total) by nebulization every 6 (six) hours as needed for wheezing or shortness of breath.   20 mL   12   . aspirin EC 81 MG tablet   Oral   Take 81 mg by mouth daily.         Marland Kitchen buPROPion (WELLBUTRIN) 100 MG tablet   Oral   Take 1 tablet (100 mg total) by mouth 3 (three) times daily. Start with 100mg  BID for 3 days, then continue with 100mg  TID.   90 tablet   2   . doxycycline (VIBRA-TABS) 100 MG tablet      1 tab po q12 hours x 3 days   6 tablet   0   . gabapentin (  NEURONTIN) 300 MG capsule   Oral   Take 1 capsule (300 mg total) by mouth at bedtime.   30 capsule   2   . hydrochlorothiazide (HYDRODIURIL) 25 MG tablet   Oral   Take 1 tablet (25 mg total) by mouth daily.   30 tablet   5   . ipratropium (ATROVENT) 0.02 % nebulizer solution   Nebulization   Take 2.5 mLs (0.5 mg total) by nebulization every 6 (six) hours.   75 mL   3   . losartan (COZAAR) 50 MG tablet   Oral   Take 1 tablet (50 mg total) by mouth daily.   30 tablet   5   . predniSONE (DELTASONE) 20 MG tablet      2 tabs po daily x 5 days then 1 tab po daily until otherwise directed at office visit 12/18   15 tablet   0   . VENTOLIN HFA 108 (90 BASE) MCG/ACT inhaler      INHALE TWO   PUFFS INTO THE LUNGS EVERY 4 HOURS AS NEEDED FOR WHEEZINGOR SHORTNESS OF BREATH.   2 Inhaler   0    BP 149/89  Pulse 87  Temp(Src) 96.4 F (35.8 C) (Rectal)  Resp 18  Ht 5\' 1"  (1.549 m)  Wt 97 lb 3.6 oz (44.1 kg)  BMI 18.38 kg/m2  SpO2 100% Physical Exam  Constitutional: She appears distressed.  Cachectic   HENT:  Head: Normocephalic and atraumatic.  Blood in oral pharynx   Eyes:  4 mm and sluggish   Neck: Neck supple. No tracheal  deviation present.  In c collar   Cardiovascular: Normal rate and intact distal pulses.   Murmur heard. Pulmonary/Chest: Breath sounds normal. She has no rales.  blt mechanical breath sounds   Abdominal: Soft. She exhibits distension.  Musculoskeletal: Normal range of motion. She exhibits no tenderness.  Neurological:  No corneal reflex. No gag reflex. Pupils sluggish.   Skin: Skin is warm and dry.    ED Course  Procedures (including critical care time) Labs Review Labs Reviewed  CBC WITH DIFFERENTIAL - Abnormal; Notable for the following:    RDW 18.7 (*)    All other components within normal limits  COMPREHENSIVE METABOLIC PANEL - Abnormal; Notable for the following:    Sodium 131 (*)    Potassium 6.1 (*)    Chloride 91 (*)    CO2 12 (*)    Glucose, Bld 168 (*)    Calcium 8.2 (*)    Albumin 2.5 (*)    AST 70 (*)    Alkaline Phosphatase 157 (*)    Total Bilirubin <0.2 (*)    All other components within normal limits  POCT I-STAT, CHEM 8 - Abnormal; Notable for the following:    Sodium 131 (*)    Potassium 5.4 (*)    Glucose, Bld 163 (*)    Calcium, Ion 1.04 (*)    Hemoglobin 17.0 (*)    HCT 50.0 (*)    All other components within normal limits  CG4 I-STAT (LACTIC ACID) - Abnormal; Notable for the following:    Lactic Acid, Venous 13.16 (*)    All other components within normal limits  POCT I-STAT 3, BLOOD GAS (G3+) - Abnormal; Notable for the following:    pH, Arterial 7.028 (*)    pCO2 arterial 49.8 (*)    pO2, Arterial 388.0 (*)    Bicarbonate 13.3 (*)    Acid-base deficit 18.0 (*)    All  other components within normal limits  CULTURE, BLOOD (ROUTINE X 2)  CULTURE, BLOOD (ROUTINE X 2)  URINE CULTURE  CULTURE, RESPIRATORY (NON-EXPECTORATED)  PROTIME-INR  APTT  URINALYSIS, ROUTINE W REFLEX MICROSCOPIC  MAGNESIUM  PHOSPHORUS  TROPONIN I  TROPONIN I  TROPONIN I  LACTIC ACID, PLASMA  PRO B NATRIURETIC PEPTIDE  CORTISOL  STREP PNEUMONIAE URINARY ANTIGEN   LEGIONELLA ANTIGEN, URINE  BASIC METABOLIC PANEL  BASIC METABOLIC PANEL  BASIC METABOLIC PANEL  BASIC METABOLIC PANEL  BASIC METABOLIC PANEL  BASIC METABOLIC PANEL  BASIC METABOLIC PANEL  PROTIME-INR  PROTIME-INR  APTT  APTT  CBC  POCT I-STAT TROPONIN I  TYPE AND SCREEN   Imaging Review Ct Head Wo Contrast  11/11/2013   CLINICAL DATA:  Fall  EXAM: CT HEAD WITHOUT CONTRAST  CT CERVICAL SPINE WITHOUT CONTRAST  TECHNIQUE: Multidetector CT imaging of the head and cervical spine was performed following the standard protocol without intravenous contrast. Multiplanar CT image reconstructions of the cervical spine were also generated.  COMPARISON:  None.  FINDINGS: CT HEAD FINDINGS  Mild global atrophy. Minimal chronic ischemic changes in the periventricular white matter. No mass effect, midline shift, or acute intracranial hemorrhage. Intact cranium.  CT CERVICAL SPINE FINDINGS  Motion artifact markedly degrades the exam. No obvious fracture or dislocation. No obvious spinal hematoma or soft tissue injury.  Extensive atherosclerotic plaque in the bilateral carotid bulbs. Endotracheal and NG tubes are in place. Severe emphysema at the lung apices.  The teres within lower molars can be seen on sagittal reconstruction imaging.  IMPRESSION: No acute intracranial pathology.  Cervical spine study is limited secondary to motion artifact. No obvious injury.   Electronically Signed   By: Maryclare Bean M.D.   On: 11/28/2013 16:06   Ct Angio Chest Pe W/cm &/or Wo Cm  11/27/2013   CLINICAL DATA:  Cardiopulmonary arrest  EXAM: CT ANGIOGRAPHY CHEST WITH CONTRAST  TECHNIQUE: Multidetector CT imaging of the chest was performed using the standard protocol during bolus administration of intravenous contrast. Multiplanar CT image reconstructions and MIPs were obtained to evaluate the vascular anatomy.  CONTRAST:  100 cc Omnipaque 350  COMPARISON:  CT ANGIO CHEST W/CM &/OR WO/CM dated 09/12/2013; CT ANGIO CHEST W/CM &/OR  WO/CM dated 04/17/2012; CT ANGIO CHEST W/CM &/OR WO/CM dated 01/16/2013; CT CHEST W/CM dated 05/24/2013  FINDINGS: There are no filling defects in the pulmonary arterial tree to suggest acute pulmonary thromboembolism.  Endotracheal and NG tubes are in place.  No abnormal mediastinal adenopathy.  No pneumothorax.  No pleural effusion.  Extensive emphysema.  2.8 x 3.2 cm mass in the medial right upper lobe is slowly enlarging comparing with multiple prior studies compatible with tumor.  Right upper lobe sub solid nodule on image 43 is not significantly changed. Other ill-defined centrilobular nodules are scattered throughout the right lung. Background of severe emphysema is noted.  Dependent atelectasis at the lung bases.  Stable T5 compression deformity worrisome for an osteoporotic fracture.  Review of the MIP images confirms the above findings.  IMPRESSION: No evidence of acute pulmonary thromboembolism.  Slowly enlarging right upper lobe mass worrisome for malignancy.  T5 compression deformity is stable.   Electronically Signed   By: Maryclare Bean M.D.   On: 11/07/2013 16:19   Ct Cervical Spine Wo Contrast  11/29/2013   CLINICAL DATA:  Fall  EXAM: CT HEAD WITHOUT CONTRAST  CT CERVICAL SPINE WITHOUT CONTRAST  TECHNIQUE: Multidetector CT imaging of the head and cervical spine was  performed following the standard protocol without intravenous contrast. Multiplanar CT image reconstructions of the cervical spine were also generated.  COMPARISON:  None.  FINDINGS: CT HEAD FINDINGS  Mild global atrophy. Minimal chronic ischemic changes in the periventricular white matter. No mass effect, midline shift, or acute intracranial hemorrhage. Intact cranium.  CT CERVICAL SPINE FINDINGS  Motion artifact markedly degrades the exam. No obvious fracture or dislocation. No obvious spinal hematoma or soft tissue injury.  Extensive atherosclerotic plaque in the bilateral carotid bulbs. Endotracheal and NG tubes are in place. Severe  emphysema at the lung apices.  The teres within lower molars can be seen on sagittal reconstruction imaging.  IMPRESSION: No acute intracranial pathology.  Cervical spine study is limited secondary to motion artifact. No obvious injury.   Electronically Signed   By: Maryclare Bean M.D.   On: 11/26/2013 16:06   Dg Chest Portable 1 View  11/12/2013   CLINICAL DATA:  Hypoxia  EXAM: PORTABLE CHEST - 1 VIEW  COMPARISON:  Chest radiograph September 10, 2013 and chest CT September 12, 2013  FINDINGS: Endotracheal tube tip is 4.6 cm above the carina. Nasogastric tube tip and side port are in the stomach. No pneumothorax. There is again noted irregular opacity in the medial aspect of the right upper lobe at the level of the azygos region. This area does not appear to have progressed compared to recent prior studies. Lungs elsewhere are clear. Heart is mildly enlarged with normal pulmonary vascularity. No adenopathy. No bone lesions.  IMPRESSION: Persistent irregular opacity in the right upper lobe medially. Question scarring versus residual tumor. CT could be helpful for further delineation in this regard. Elsewhere lungs are clear. Tube positions are as described without pneumothorax.   Electronically Signed   By: Lowella Grip M.D.   On: 11/26/2013 15:06    EKG Interpretation    Date/Time:  Monday November 18 2013 15:03:36 EST Ventricular Rate:  92 PR Interval:  204 QRS Duration: 72 QT Interval:  354 QTC Calculation: 437 R Axis:   91 Text Interpretation:  Normal sinus rhythm Biatrial enlargement Rightward axis Septal infarct , age undetermined Abnormal ECG Confirmed by Christy Gentles  MD, DONALD [phone]) on 11/18/2013 3:59:29 PM            MDM   Final diagnoses:  Acute respiratory failure    58 yo F in cardiac arrest s/p ROSC. PT intubated on scene. BLT breath sounds. Good aeration. Strong pulses. Patient received NS hydration. CXR with no acute findings. CT head/C spine and chest with No acute findings.  CT chest demonstrated slight increase in size of mass. Concern for malignancies. No hyperkalemic. Bedside US with no pericardial effusion, PTX and poor EF ~ 30-35% (per bedside US) Case dsicussed with my attending Dr. Christy Gentles. Patient admitted to ICU for furhter care. Of note CT PE study negative for PE.    Ruthell Rummage, MD 11/17/2013 [phone]

## 2013-11-18 NOTE — Procedures (Signed)
Supervised and present through the entire procedure.  Doree Fudge, MD Pulmonary and [place] Pager: [phone]

## 2013-11-18 NOTE — ED Notes (Signed)
Paged Critical Care to [phone]

## 2013-11-18 NOTE — ED Notes (Signed)
Paged Critical Care to [phone]

## 2013-11-18 NOTE — Progress Notes (Signed)
Name: Karina Weber MRN: [id number] DOB: 08/08/1956  ELECTRONIC ICU PHYSICIAN NOTE  Problem:  ? Needs ards protocol  cxr s edema/ pt air trapping on rate 30   Intervention:  Change to vt 500, rate 24 and recheck for air trapping/ acidosis in one hour and further adjustments down on rate if needed  Christinia Gully 11/08/2013, 9:09 PM

## 2013-11-19 ENCOUNTER — Inpatient Hospital Stay (HOSPITAL_COMMUNITY): Payer: Medicare Other

## 2013-11-19 DIAGNOSIS — I469 Cardiac arrest, cause unspecified: Secondary | ICD-10-CM

## 2013-11-19 DIAGNOSIS — J449 Chronic obstructive pulmonary disease, unspecified: Secondary | ICD-10-CM

## 2013-11-19 DIAGNOSIS — J9600 Acute respiratory failure, unspecified whether with hypoxia or hypercapnia: Secondary | ICD-10-CM

## 2013-11-19 DIAGNOSIS — F1910 Other psychoactive substance abuse, uncomplicated: Secondary | ICD-10-CM

## 2013-11-19 DIAGNOSIS — I359 Nonrheumatic aortic valve disorder, unspecified: Secondary | ICD-10-CM

## 2013-11-19 LAB — APTT: aPTT: 31 seconds (ref 24–37)

## 2013-11-19 LAB — BASIC METABOLIC PANEL
BUN: 14 mg/dL (ref 6–23)
BUN: 14 mg/dL (ref 6–23)
BUN: 15 mg/dL (ref 6–23)
BUN: 15 mg/dL (ref 6–23)
BUN: 15 mg/dL (ref 6–23)
BUN: 16 mg/dL (ref 6–23)
CALCIUM: 8 mg/dL — AB (ref 8.4–10.5)
CHLORIDE: 97 meq/L (ref 96–112)
CHLORIDE: 98 meq/L (ref 96–112)
CHLORIDE: 100 meq/L (ref 96–112)
CO2: 18 meq/L — AB (ref 19–32)
CO2: 21 meq/L (ref 19–32)
CO2: 22 meq/L (ref 19–32)
CO2: 20 mEq/L (ref 19–32)
CO2: 19 meq/L (ref 19–32)
CO2: 20 mEq/L (ref 19–32)
CREATININE: 0.42 mg/dL — AB (ref 0.50–1.10)
CREATININE: 0.41 mg/dL — AB (ref 0.50–1.10)
CREATININE: 0.48 mg/dL — AB (ref 0.50–1.10)
Calcium: 7.7 mg/dL — ABNORMAL LOW (ref 8.4–10.5)
Calcium: 7.9 mg/dL — ABNORMAL LOW (ref 8.4–10.5)
Calcium: 8 mg/dL — ABNORMAL LOW (ref 8.4–10.5)
Calcium: 7.1 mg/dL — ABNORMAL LOW (ref 8.4–10.5)
Calcium: 7.9 mg/dL — ABNORMAL LOW (ref 8.4–10.5)
Chloride: 102 mEq/L (ref 96–112)
Chloride: 96 mEq/L (ref 96–112)
Chloride: 96 mEq/L (ref 96–112)
Creatinine, Ser: 0.36 mg/dL — ABNORMAL LOW (ref 0.50–1.10)
Creatinine, Ser: 0.41 mg/dL — ABNORMAL LOW (ref 0.50–1.10)
Creatinine, Ser: 0.48 mg/dL — ABNORMAL LOW (ref 0.50–1.10)
GFR calc Af Amer: >90 mL/min (ref >90)
GFR calc Af Amer: >90 mL/min (ref >90)
GFR calc Af Amer: >90 mL/min (ref >90)
GFR calc Af Amer: >90 mL/min (ref >90)
GFR calc non Af Amer: >90 mL/min (ref >90)
GFR calc non Af Amer: >90 mL/min (ref >90)
GFR calc non Af Amer: >90 mL/min (ref >90)
GFR calc non Af Amer: >90 mL/min (ref >90)
GFR calc non Af Amer: >90 mL/min (ref >90)
GLUCOSE: 106 mg/dL — AB (ref 70–99)
GLUCOSE: 94 mg/dL (ref 70–99)
GLUCOSE: 90 mg/dL (ref 70–99)
Glucose, Bld: 107 mg/dL — ABNORMAL HIGH (ref 70–99)
Glucose, Bld: 110 mg/dL — ABNORMAL HIGH (ref 70–99)
Glucose, Bld: 112 mg/dL — ABNORMAL HIGH (ref 70–99)
POTASSIUM: 4.2 meq/L (ref 3.7–5.3)
POTASSIUM: 4.3 meq/L (ref 3.7–5.3)
Potassium: 4.8 mEq/L (ref 3.7–5.3)
Potassium: 3.9 mEq/L (ref 3.7–5.3)
Potassium: 3.5 mEq/L — ABNORMAL LOW (ref 3.7–5.3)
Potassium: 3.9 mEq/L (ref 3.7–5.3)
SODIUM: 136 meq/L — AB (ref 137–147)
SODIUM: 132 meq/L — AB (ref 137–147)
Sodium: 134 mEq/L — ABNORMAL LOW (ref 137–147)
Sodium: 135 mEq/L — ABNORMAL LOW (ref 137–147)
Sodium: 136 mEq/L — ABNORMAL LOW (ref 137–147)
Sodium: 137 mEq/L (ref 137–147)

## 2013-11-19 LAB — URINE CULTURE
COLONY COUNT: NO GROWTH
Culture: NO GROWTH

## 2013-11-19 LAB — LACTIC ACID, PLASMA
Lactic Acid, Venous: 1 mmol/L (ref 0.5–2.2)
Lactic Acid, Venous: 1 mmol/L (ref 0.5–2.2)

## 2013-11-19 LAB — CBC
HEMATOCRIT: 44 % (ref 36.0–46.0)
Hemoglobin: 14.8 g/dL (ref 12.0–15.0)
MCH: 29.8 pg (ref 26.0–34.0)
MCHC: 33.6 g/dL (ref 30.0–36.0)
MCV: 88.7 fL (ref 78.0–100.0)
Platelets: 255 10*3/uL (ref 150–400)
RBC: 4.96 MIL/uL (ref 3.87–5.11)
RDW: 18.4 % — AB (ref 11.5–15.5)
WBC: 12.5 10*3/uL — ABNORMAL HIGH (ref 4.0–10.5)

## 2013-11-19 LAB — RAPID URINE DRUG SCREEN, HOSP PERFORMED
Amphetamines: NOT DETECTED
Barbiturates: NOT DETECTED
Benzodiazepines: POSITIVE — AB
COCAINE: POSITIVE — AB
OPIATES: NOT DETECTED
Tetrahydrocannabinol: NOT DETECTED

## 2013-11-19 LAB — TROPONIN I
Troponin I: <0.3 ng/mL (ref <0.30)
Troponin I: <0.3 ng/mL (ref <0.30)

## 2013-11-19 LAB — LEGIONELLA ANTIGEN, URINE: LEGIONELLA ANTIGEN, URINE: NEGATIVE

## 2013-11-19 LAB — GLUCOSE, CAPILLARY
GLUCOSE-CAPILLARY: 112 mg/dL — AB (ref 70–99)
GLUCOSE-CAPILLARY: 120 mg/dL — AB (ref 70–99)
GLUCOSE-CAPILLARY: 84 mg/dL (ref 70–99)
GLUCOSE-CAPILLARY: 84 mg/dL (ref 70–99)
GLUCOSE-CAPILLARY: 99 mg/dL (ref 70–99)
Glucose-Capillary: 138 mg/dL — ABNORMAL HIGH (ref 70–99)
Glucose-Capillary: 108 mg/dL — ABNORMAL HIGH (ref 70–99)
Glucose-Capillary: 96 mg/dL (ref 70–99)
Glucose-Capillary: 96 mg/dL (ref 70–99)
Glucose-Capillary: 82 mg/dL (ref 70–99)

## 2013-11-19 LAB — CORTISOL: Cortisol, Plasma: 40.2 ug/dL

## 2013-11-19 LAB — PROTIME-INR
INR: 1.04 (ref 0.00–1.49)
Prothrombin Time: 13.4 seconds (ref 11.6–15.2)

## 2013-11-19 MED ORDER — FOLIC ACID 5 MG/ML IJ SOLN
1.0000 mg | Freq: Every day | INTRAMUSCULAR | Status: DC
  Administered 2013-11-19 – 2013-11-22 (×4): 1 mg via INTRAVENOUS
  Filled 2013-11-19 (×4): qty 0.2

## 2013-11-19 MED ORDER — NITROGLYCERIN IN D5W 200-5 MCG/ML-% IV SOLN
INTRAVENOUS | Status: AC
  Administered 2013-11-19: 50 ug/min via INTRAVENOUS
  Filled 2013-11-19: qty 250

## 2013-11-19 MED ORDER — THIAMINE HCL 100 MG/ML IJ SOLN
100.0000 mg | Freq: Every day | INTRAMUSCULAR | Status: DC
  Administered 2013-11-19 – 2013-11-22 (×4): 100 mg via INTRAVENOUS
  Filled 2013-11-19 (×4): qty 1

## 2013-11-19 MED ORDER — IOHEXOL 350 MG/ML SOLN
100.0000 mL | Freq: Once | INTRAVENOUS | Status: AC | PRN
  Administered 2013-11-19: 100 mL via INTRAVENOUS

## 2013-11-19 MED ORDER — NITROGLYCERIN IN D5W 200-5 MCG/ML-% IV SOLN
2.0000 ug/min | INTRAVENOUS | Status: DC
  Administered 2013-11-19: 50 ug/min via INTRAVENOUS
  Filled 2013-11-19: qty 250

## 2013-11-19 MED ORDER — ADULT MULTIVITAMIN LIQUID CH
5.0000 mL | Freq: Every day | ORAL | Status: DC
  Administered 2013-11-19 – 2013-11-22 (×4): 5 mL via ORAL
  Filled 2013-11-19 (×4): qty 5

## 2013-11-19 NOTE — H&P (Addendum)
PULMONARY / CRITICAL CARE MEDICINE  Name: Karina Weber MRN: [id number] DOB: 04/17/1956    ADMISSION DATE:  11/08/2013 CONSULTATION DATE:  11/11/2013  REFERRING MD :  EDP PRIMARY SERVICE: PCCM  CHIEF COMPLAINT:  PEA Arrest  BRIEF PATIENT DESCRIPTION: 58 year old female presented 2/16 after being found down in PEA arrest by family. ROSC with CPR and epi x 2.   SIGNIFICANT EVENTS / STUDIES:  2/16  PEA arrest, ~20 mins downtime, ROSC with epi x 2  2/16  Head CT >>> 2/16  Chest CTA >>>  LINES / TUBES: OETT 2/16 >>> OGT 2/16 >>> L [place] TLC 2/16 >>> L rad a-line 2/16 >>>  CULTURES: Blood 2/16 >>> Urine 2/16 >>> Respiratory 2/16 >>>  ANTIBIOTICS: None  SUBJECTIVE: No events overnight, sedated, paralyzed and intubated.  VITAL SIGNS: Temp:  [89.4 F (31.9 C)-98.4 F (36.9 C)] 91.6 F (33.1 C) (02/17 0900) Pulse Rate:  [50-104] 75 (02/17 0900) Resp:  [14-32] 20 (02/17 0900) BP: (99-226)/(62-114) 138/80 mmHg (02/17 0900) SpO2:  [89 %-100 %] 96 % (02/17 0900) Arterial Line BP: (96-220)/(60-93) 142/80 mmHg (02/17 0900) FiO2 (%):  [40 %-100 %] 50 % (02/17 0900) Weight:  [44.1 kg (97 lb 3.6 oz)-52 kg (114 lb 10.2 oz)] 52 kg (114 lb 10.2 oz) (02/17 0500)  HEMODYNAMICS: CVP:  [8 mmHg-18 mmHg] 10 mmHg  VENTILATOR SETTINGS: Vent Mode:  [-] PRVC FiO2 (%):  [40 %-100 %] 50 % Set Rate:  [16 bmp-30 bmp] 20 bmp Vt Set:  [480 mL-500 mL] 500 mL PEEP:  [5 cmH20] 5 cmH20 Plateau Pressure:  [19 cmH20-26 cmH20] 21 cmH20  INTAKE / OUTPUT: Intake/Output     02/16 0701 - 02/17 0700 02/17 0701 - 02/18 0700   I.V. (mL/kg) 1760.7 (33.9) 166 (3.2)   NG/GT 60    IV Piggyback 400    Total Intake(mL/kg) 2220.7 (42.7) 166 (3.2)   Urine (mL/kg/hr) 2450 215 (1.5)   Emesis/NG output 700    Total Output 3150 215   Net -929.3 -49        Stool Occurrence 2 x     PHYSICAL EXAMINATION: General:  Intubated, sedated female of normal body habitus Neuro:  Unresponsive, does not follow commands.   HEENT:  Glandorf/AT, pupils pinpoint.  Cardiovascular:  RRR Lungs:  No wheeze Abdomen:  Soft, non-distended Musculoskeletal:  Intact Skin:  Intact  LABS:  CBC  Recent Labs Lab 11/26/2013 1443  11/28/2013 1800 11/17/2013 2038 11/29/2013 2220 11/19/13 0430  WBC 7.3  --  10.3  --   --  12.5*  HGB 14.1  < > 13.8 16.3* 16.7* 14.8  HCT 44.9  < > 41.7 48.0* 49.0* 44.0  PLT 260  --  233  --   --  255  < > = values in this interval not displayed. Coag's  Recent Labs Lab 11/17/2013 1443 11/12/2013 1800 11/19/13 0002  APTT 28 32 31  INR 1.17 1.10 1.04   BMET  Recent Labs Lab 11/19/13 0002 11/19/13 0430 11/19/13 0804  NA 132* 134* 135*  K 4.3 4.8 4.2  CL 96 96 97  CO2 19 20 21   BUN 14 15 15   CREATININE 0.48* 0.41* 0.42*  GLUCOSE 107* 112* 110*   Electrolytes  Recent Labs Lab 11/30/2013 1800  11/19/13 0002 11/19/13 0430 11/19/13 0804  CALCIUM 7.4*  < > 8.0* 7.7* 7.9*  MG 1.4*  --   --   --   --   PHOS 3.6  --   --   --   --   < > =  values in this interval not displayed. Sepsis Markers  Recent Labs Lab 11/25/2013 1500 11/28/2013 2004 11/19/13 0002  LATICACIDVEN 13.16* 6.5* 1.0   ABG  Recent Labs Lab 11/13/2013 1613 11/12/2013 1816 11/06/2013 2216  PHART 7.028* 7.335* 7.456*  PCO2ART 49.8* 23.7* 26.2*  PO2ART 388.0* 123.0* 77.0*   Liver Enzymes  Recent Labs Lab 11/26/2013 1443  AST 70*  ALT 19  ALKPHOS 157*  BILITOT <0.2*  ALBUMIN 2.5*   Cardiac Enzymes  Recent Labs Lab 11/11/2013 1800 11/04/2013 2210 11/19/13 0002 11/19/13 0804  TROPONINI <0.30 <0.30 <0.30 <0.30  PROBNP 1861.0*  --   --   --    Glucose  Recent Labs Lab 11/13/2013 1811 11/20/2013 2112 11/19/13 0009 11/19/13 0209 11/19/13 0802  GLUCAP 138* 108* 120* 112* 99   Imaging  CXR: 2/16 >>> Hardware in good position, no overt airspace disease   ASSESSMENT / PLAN:  PULMONARY A: Acute respiratory failure in setting of cardiac arrests Possible pulmonary embolism given cardia arrest and history  of malignancy COPD without evidence of exacerbation Lung Ca s/p XRT P:   - Goal pH>7.30, SpO2>92 - Continue full vent support. - VAP bundle - Hold SBT while on Nimbex - Trend ABG/CXR - Albuterol / Atrovent - No indication for systemic steroids. - Chest CTA: r/o PE and evaluate for lung mass  CARDIOVASCULAR A:  PEA cardiac arrest in middle age female without cardiac history and no EKG changes - doubt primary cardiac etiology Normotensive P:  - Goal MAP > 85 while on hypothermia. - Levophed for target above. - Trend lactate / troponin. - Defer interventional cardiac evaluation ( also received IV contrast for CTA ). - TTE done and pending.  RENAL A:   Hyperkalemia in setting of acute acidosis, improved P:   - Trend BMP. - Fluids per Hypothermia protocol. - Replace electrolytes as indicated.  GASTROINTESTINAL / GU A:   Fecal material from vagina when Foley is attempted? Rectovaginal fistula? Nutrition GI Px P:   - Protonix. - TF per Nutritionist when off paralysis. - Pelvic CT with rectal contrast when stable.  HEMATOLOGIC A:   Hemoconcentration P:  - Trend CBC - APTT / INR - SCDs  INFECTIOUS A:   No evidence of overt infection P:   - Panculture - Observe off abx  ENDOCRINE A:   Hyperglycemia  P:   - SSI  NEUROLOGIC A:   Acute encephalopathy Possible anoxia Hx polysubstance abuse Chronic alcoholism. P:   - Hypothermia protocol - Fentanyl / Versed / Nimbex - Head CT negative. - Drug screen for urine not sent, will resend. - Once no longer paralyzed will call neuro and perform EEG. - Added thiamine, folate and MVI.  I have personally obtained history, examined patient, evaluated and interpreted laboratory and imaging results, reviewed medical records, formulated assessment / plan and placed orders.  CRITICAL CARE:  The patient is critically ill with multiple organ systems failure and requires high complexity decision making for assessment and  support, frequent evaluation and titration of therapies, application of advanced monitoring technologies and extensive interpretation of multiple databases. Critical Care Time devoted to patient care services described in this note is 35 minutes.   Rush Farmer, M.D. [facility] Pulmonary/Critical Care Medicine. Pager: [phone]. After hours pager: [phone].

## 2013-11-19 NOTE — Progress Notes (Signed)
INITIAL NUTRITION ASSESSMENT  DOCUMENTATION CODES Per approved criteria  -Non-severe (moderate) malnutrition in the context of chronic illness   INTERVENTION: 1.  Enteral nutrition; if TFs warranted, recommend initiation of Vital 1.2 @ 20 mL/hr continuous.  Advance by 10 mL q 4 hrs to 40 mL/hr goal to provide 1152 kcal, 72g protein, 778 mL free water.  NUTRITION DIAGNOSIS: Inadequate oral intake related to inability to eat as evidenced by intubated, NPO.   Monitor:  1.  Enteral nutrition; initiation with tolerance.  Pt to meet >/=90% estimated needs with nutrition support.  2.  Wt/wt change; monitor trends  Reason for Assessment: Vent  58 y.o. female  Admitting Dx: PEA arrest  ASSESSMENT: Pt admitted with PEA arrest. Pt with h/o polysubtance abuse (marijuana, cocaine, EtOH).  Found down by family.   Patient is currently intubated on ventilator support. Currently on hypothermia protocol.  MV: 9.4 L/min Temp (24hrs), Avg:92.5 F (33.6 C), Min:89.4 F (31.9 C), Max:98.4 F (36.9 C)  Propofol: none  Nutrition Focused Physical Exam: Subcutaneous Fat:  Orbital Region: mild wasting Upper Arm Region: WNL Thoracic and Lumbar Region: unable to assess  Muscle:  Temple Region: moderate wasting Clavicle Bone Region: mild wasting Clavicle and Acromion Bone Region: mild wasting Scapular Bone Region: unable to assess Dorsal Hand: mild wasting Patellar Region: unable to assess Anterior Thigh Region: unable to assess Posterior Calf Region: unable to assess  Edema: none present  Pt currently on paralytic.  No plans to start TFs until weaned.  Pt meets criteria for moderate MALNUTRITION in the context of chronic as evidenced by mild-moderate wasting identified on physical exam.    Height: Ht Readings from Last 1 Encounters:  11/19/2013 5\' 1"  (1.549 m)    Weight: Wt Readings from Last 1 Encounters:  11/19/13 114 lb 10.2 oz (52 kg)    Ideal Body Weight: 105 lbs  % Ideal  Body Weight: 108%  Wt Readings from Last 10 Encounters:  11/19/13 114 lb 10.2 oz (52 kg)  09/13/13 97 lb 3.6 oz (44.1 kg)  06/17/13 97 lb 12.8 oz (44.362 kg)  06/12/13 113 lb 12.1 oz (51.6 kg)  05/30/13 109 lb 9.6 oz (49.714 kg)  05/09/13 103 lb 4.8 oz (46.857 kg)  02/21/13 98 lb (44.453 kg)  01/18/13 96 lb 6.4 oz (43.727 kg)  09/24/12 100 lb 15.5 oz (45.8 kg)  07/23/12 106 lb 12.8 oz (48.444 kg)    Usual Body Weight: highly variable, 95-105 lbs   % Usual Body Weight: 114%  BMI:  Body mass index is 21.67 kg/(m^2).  Estimated Nutritional Needs: Kcal: 1197 Protein: 62-73g Fluid: >1.5 L/day  Skin: 2/16  Diet Order: NPO  EDUCATION NEEDS: -Education not appropriate at this time   Intake/Output Summary (Last 24 hours) at 11/19/13 1257 Last data filed at 11/19/13 1200  Gross per 24 hour  Intake 2685.73 ml  Output   3620 ml  Net -934.27 ml    Last BM: 2/16   Labs:   Recent Labs Lab 11/17/2013 1800  11/19/13 0002 11/19/13 0430 11/19/13 0804  NA 134*  < > 132* 134* 135*  K 4.3  < > 4.3 4.8 4.2  CL 96  < > 96 96 97  CO2 11*  < > 19 20 21   BUN 10  < > 14 15 15   CREATININE 0.60  < > 0.48* 0.41* 0.42*  CALCIUM 7.4*  < > 8.0* 7.7* 7.9*  MG 1.4*  --   --   --   --  PHOS 3.6  --   --   --   --   GLUCOSE 137*  < > 107* 112* 110*  < > = values in this interval not displayed.  CBG (last 3)   Recent Labs  11/19/13 0802 11/19/13 1000 11/19/13 1152  GLUCAP 99 96 96    Scheduled Meds: . antiseptic oral rinse  15 mL Mouth Rinse QID  . artificial tears  1 application Both Eyes 3 times per day  . chlorhexidine  15 mL Mouth Rinse BID  . folic acid  1 mg Intravenous Daily  . insulin aspart  0-15 Units Subcutaneous 6 times per day  . multivitamin  5 mL Oral Daily  . pantoprazole (PROTONIX) IV  40 mg Intravenous QHS  . thiamine  100 mg Intravenous Daily    Continuous Infusions: . sodium chloride 50 mL/hr at 11/19/13 1200  . cisatracurium (NIMBEX) infusion 1.5  mcg/kg/min (11/19/13 1200)  . fentaNYL infusion INTRAVENOUS 200 mcg/hr (11/19/13 1200)  . midazolam (VERSED) infusion 4 mg/hr (11/19/13 1205)  . nitroGLYCERIN 50 mcg/min (11/19/13 1200)  . norepinephrine (LEVOPHED) Adult infusion Stopped (11/21/2013 2100)    Past Medical History  Diagnosis Date  . Tobacco abuse   . COPD (chronic obstructive pulmonary disease)     exacerbagtion in 6/12 and in 2010.   Marland Kitchen Hypertension   . History of radiation therapy 07/19/10 to 07/28/10    RUL lung  . 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 Alcohol abuse     2 beers a day  . Asthma   . Headache(784.0)   . Chronic hypercapnic respiratory failure   . Polysubstance abuse   . Edema 05/31/13    leg swelling,  . Supplemental oxygen dependent     sob     Past Surgical History  Procedure Laterality Date  . No past surgeries      Brynda Greathouse, MS RD LDN Clinical Inpatient Dietitian Pager: [phone] Weekend/After hours pager: [phone]

## 2013-11-19 NOTE — ED Provider Notes (Signed)
I have personally seen and examined the patient.  I have discussed the plan of care with the resident.  I have reviewed the documentation on PMH/FH/Soc. History.  I have reviewed the documentation of the resident and agree.   CRITICAL CARE Performed by: Sharyon Cable Total critical care time: 40 Critical care time was exclusive of separately billable procedures and treating other patients. Critical care was necessary to treat or prevent imminent or life-threatening deterioration. Critical care was time spent personally by me on the following activities: development of treatment plan with patient and/or surrogate as well as nursing, discussions with consultants, evaluation of patient's response to treatment, examination of patient, obtaining history from patient or surrogate, ordering and performing treatments and interventions, ordering and review of laboratory studies, ordering and review of radiographic studies, pulse oximetry and re-evaluation of patient's condition.   PATIENT SEEN ON ARRIVAL, S/P [place] VIA GLIDESCOPE BY RESIDENT PT STABILIZED IN THE ED CASE DISCUSSED WITH CRITICAL CARE   Sharyon Cable, MD 11/19/13 [phone]

## 2013-11-19 NOTE — Progress Notes (Signed)
  Echocardiogram 2D Echocardiogram has been performed.  Diamond Nickel 11/19/2013, 8:55 AM

## 2013-11-19 NOTE — Progress Notes (Signed)
Utilization Review Completed.  

## 2013-11-20 ENCOUNTER — Inpatient Hospital Stay (HOSPITAL_COMMUNITY): Payer: Medicare Other

## 2013-11-20 DIAGNOSIS — F1020 Alcohol dependence, uncomplicated: Secondary | ICD-10-CM

## 2013-11-20 DIAGNOSIS — J9610 Chronic respiratory failure, unspecified whether with hypoxia or hypercapnia: Secondary | ICD-10-CM

## 2013-11-20 DIAGNOSIS — J441 Chronic obstructive pulmonary disease with (acute) exacerbation: Secondary | ICD-10-CM

## 2013-11-20 LAB — CBC
HCT: 43.2 % (ref 36.0–46.0)
Hemoglobin: 14.7 g/dL (ref 12.0–15.0)
MCH: 30.2 pg (ref 26.0–34.0)
MCHC: 34 g/dL (ref 30.0–36.0)
MCV: 88.7 fL (ref 78.0–100.0)
Platelets: 258 K/uL (ref 150–400)
RBC: 4.87 MIL/uL (ref 3.87–5.11)
RDW: 19.4 % — ABNORMAL HIGH (ref 11.5–15.5)
WBC: 13.2 K/uL — ABNORMAL HIGH (ref 4.0–10.5)

## 2013-11-20 LAB — BASIC METABOLIC PANEL
BUN: 17 mg/dL (ref 6–23)
BUN: 17 mg/dL (ref 6–23)
CO2: 19 mEq/L (ref 19–32)
CO2: 19 meq/L (ref 19–32)
Calcium: 7.8 mg/dL — ABNORMAL LOW (ref 8.4–10.5)
Calcium: 7.8 mg/dL — ABNORMAL LOW (ref 8.4–10.5)
Chloride: 100 mEq/L (ref 96–112)
Chloride: 100 mEq/L (ref 96–112)
Creatinine, Ser: 0.58 mg/dL (ref 0.50–1.10)
Creatinine, Ser: 0.59 mg/dL (ref 0.50–1.10)
GFR calc non Af Amer: >90 mL/min (ref >90)
GFR calc non Af Amer: >90 mL/min (ref >90)
GLUCOSE: 65 mg/dL — AB (ref 70–99)
Glucose, Bld: 81 mg/dL (ref 70–99)
POTASSIUM: 3.9 meq/L (ref 3.7–5.3)
POTASSIUM: 4 meq/L (ref 3.7–5.3)
Sodium: 137 mEq/L (ref 137–147)
Sodium: 135 mEq/L — ABNORMAL LOW (ref 137–147)

## 2013-11-20 LAB — BLOOD GAS, ARTERIAL
Acid-base deficit: 1.8 mmol/L (ref 0.0–2.0)
Bicarbonate: 22.2 mEq/L (ref 20.0–24.0)
DRAWN BY: 33176
FIO2: 0.5 %
MECHVT: 500 mL
O2 Saturation: 96.7 %
PCO2 ART: 36 mmHg (ref 35.0–45.0)
PEEP/CPAP: 5 cmH2O
PH ART: 7.406 (ref 7.350–7.450)
PO2 ART: 92.6 mmHg (ref 80.0–100.0)
Patient temperature: 98.6
RATE: 20 resp/min
TCO2: 23.3 mmol/L (ref 0–100)

## 2013-11-20 LAB — GLUCOSE, CAPILLARY
GLUCOSE-CAPILLARY: 87 mg/dL (ref 70–99)
Glucose-Capillary: 67 mg/dL — ABNORMAL LOW (ref 70–99)
Glucose-Capillary: 88 mg/dL (ref 70–99)
Glucose-Capillary: 96 mg/dL (ref 70–99)
Glucose-Capillary: 84 mg/dL (ref 70–99)
Glucose-Capillary: 93 mg/dL (ref 70–99)
Glucose-Capillary: 92 mg/dL (ref 70–99)

## 2013-11-20 LAB — MAGNESIUM: Magnesium: 1 mg/dL — ABNORMAL LOW (ref 1.5–2.5)

## 2013-11-20 LAB — PHOSPHORUS: Phosphorus: 4 mg/dL (ref 2.3–4.6)

## 2013-11-20 LAB — TRIGLYCERIDES: Triglycerides: 85 mg/dL (ref <150)

## 2013-11-20 MED ORDER — DEXTROSE 50 % IV SOLN
25.0000 mL | Freq: Once | INTRAVENOUS | Status: AC | PRN
  Administered 2013-11-20: 25 mL via INTRAVENOUS

## 2013-11-20 MED ORDER — LORAZEPAM 2 MG/ML IJ SOLN
INTRAMUSCULAR | Status: AC
  Administered 2013-11-20: 2 mg
  Filled 2013-11-20: qty 1

## 2013-11-20 MED ORDER — SODIUM CHLORIDE 0.9 % IV SOLN
200.0000 mg | Freq: Two times a day (BID) | INTRAVENOUS | Status: DC
  Administered 2013-11-20 – 2013-11-22 (×4): 200 mg via INTRAVENOUS
  Filled 2013-11-20 (×7): qty 20

## 2013-11-20 MED ORDER — DEXTROSE 50 % IV SOLN
INTRAVENOUS | Status: AC
  Administered 2013-11-20: 25 mL via INTRAVENOUS
  Filled 2013-11-20: qty 50

## 2013-11-20 MED ORDER — PROPOFOL 10 MG/ML IV BOLUS
0.2500 mg/kg | INTRAVENOUS | Status: AC
  Administered 2013-11-20 (×4): 12 mg via INTRAVENOUS
  Filled 2013-11-20: qty 20

## 2013-11-20 MED ORDER — LORAZEPAM 2 MG/ML IJ SOLN
4.0000 mg | Freq: Once | INTRAMUSCULAR | Status: AC
  Administered 2013-11-20: 4 mg via INTRAVENOUS
  Filled 2013-11-20: qty 2

## 2013-11-20 MED ORDER — SODIUM CHLORIDE 0.9 % IV SOLN
1000.0000 mg | Freq: Once | INTRAVENOUS | Status: AC
  Administered 2013-11-20: 1000 mg via INTRAVENOUS
  Filled 2013-11-20: qty 10

## 2013-11-20 MED ORDER — VALPROATE SODIUM 500 MG/5ML IV SOLN
2000.0000 mg | INTRAVENOUS | Status: AC
  Administered 2013-11-20: 2000 mg via INTRAVENOUS
  Filled 2013-11-20: qty 20

## 2013-11-20 MED ORDER — VALPROATE SODIUM 500 MG/5ML IV SOLN
500.0000 mg | Freq: Three times a day (TID) | INTRAVENOUS | Status: DC
  Administered 2013-11-20 – 2013-11-22 (×6): 500 mg via INTRAVENOUS
  Filled 2013-11-20 (×8): qty 5

## 2013-11-20 MED ORDER — PROPOFOL 10 MG/ML IV EMUL
INTRAVENOUS | Status: AC
  Filled 2013-11-20: qty 100

## 2013-11-20 MED ORDER — PROPOFOL 10 MG/ML IV EMUL
70.0000 ug/kg/min | INTRAVENOUS | Status: DC
  Administered 2013-11-20: 70 ug/kg/min via INTRAVENOUS
  Administered 2013-11-20: 10 ug/kg/min via INTRAVENOUS
  Administered 2013-11-20 (×2): 70 ug/kg/min via INTRAVENOUS
  Filled 2013-11-20 (×8): qty 100

## 2013-11-20 MED ORDER — PROPOFOL 10 MG/ML IV EMUL
100.0000 ug/kg/min | INTRAVENOUS | Status: DC
  Administered 2013-11-21 (×2): 100 ug/kg/min via INTRAVENOUS
  Filled 2013-11-20 (×2): qty 100

## 2013-11-20 MED ORDER — LORAZEPAM 2 MG/ML IJ SOLN
2.0000 mg | Freq: Once | INTRAMUSCULAR | Status: AC
  Administered 2013-11-20: 2 mg via INTRAVENOUS

## 2013-11-20 MED ORDER — SODIUM CHLORIDE 0.9 % IV SOLN
500.0000 mg | Freq: Two times a day (BID) | INTRAVENOUS | Status: DC
  Administered 2013-11-20 – 2013-11-22 (×5): 500 mg via INTRAVENOUS
  Filled 2013-11-20 (×7): qty 5

## 2013-11-20 NOTE — Consult Note (Addendum)
Neurology Consultation Reason for Consult: Status epilepticus Referring Physician: Jennet Maduro  CC: Seizures  History is obtained from:Medical record  HPI: Karina Weber is a 58 y.o. female with a history of lung cancer, drug abuse who presented with PEA arrest with a downtime of 20 minutes. She underwent cooling protocol and was noted this morning after rewarming to be having activity concerning for seizure activity. Neurology was therefore consulted.     ROS: Unable to assess secondary to patient's altered mental status.    Past Medical History  Diagnosis Date  . Tobacco abuse   . COPD (chronic obstructive pulmonary disease)     exacerbagtion in 6/12 and in 2010.   Marland Kitchen Hypertension   . History of radiation therapy 07/19/10 to 07/28/10    RUL lung  . 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 Alcohol abuse     2 beers a day  . Asthma   . Headache(784.0)   . Chronic hypercapnic respiratory failure   . Polysubstance abuse   . Edema 05/31/13    leg swelling,  . Supplemental oxygen dependent     sob     Family History: Unable to assess secondary to patient's altered mental status.    Social History: Tob: Unable to assess secondary to patient's altered mental status.    Exam: Current vital signs: BP 103/62  Pulse 100  Temp(Src) 98.6 F (37 C) (Core (Comment))  Resp 20  Ht 5\' 1"  (1.549 m)  Wt 47.9 kg (105 lb 9.6 oz)  BMI 19.96 kg/m2  SpO2 100% Vital signs in last 24 hours: Temp:  [91.4 F (33 C)-98.8 F (37.1 C)] 98.6 F (37 C) (02/18 0800) Pulse Rate:  [72-100] 100 (02/18 0600) Resp:  [18-27] 20 (02/18 0800) BP: (78-193)/(56-159) 103/62 mmHg (02/18 0847) SpO2:  [94 %-100 %] 100 % (02/18 0700) Arterial Line BP: (68-155)/(46-94) 99/62 mmHg (02/18 0800) FiO2 (%):  [50 %] 50 % (02/18 0847) Weight:  [47.9 kg (105 lb 9.6 oz)] 47.9 kg (105 lb 9.6 oz) (02/18 0440)  General: in bed, intubated having convulsive seizure.  CV: RRR Mental Status: Does not  open eyes or follow commands.  Cranial Nerves: II: Does not blink to threat. Pupils are equal, round, and reactive to light.  Discs are difficult to visualize. III,IV, VI: No eye deviation, eyes midline, do not move with doll's eye V,VII: difficult to assess corneals due to frequent blinking from seizure.  VIII, X, XI, XII: Unable to assess secondary to patient's altered mental status.  Motor: Subtle clonic activity is seen in the right arm and leg > left, bilateraly face.  Sensory: Does not respond to noxious stimuli Deep Tendon Reflexes: 2+ and symmetric in the biceps and patellae.  Cerebellar: Unable to assess secondary to patient's altered mental status.  Gait: Unable to assess secondary to patient's altered mental status.    I have reviewed labs in epic and the results pertinent to this consultation are: BMP - unremarkable, calcium low but corrects with low albumin.   I have reviewed the images obtained: CT head - negative  Impression: 58 yo F with prolonged downtime and early convulsive status epilepticus after hypothermia. This will need to be treated aggressivley prior to prognostication given that it is not myoclonic-status epilepticus.   Recommendations: 1) Ativan 2mg  IV,  2) additional keppra 1 gm 3) load with depakote 25mg /kg 4) stat EEG   This patient is critically ill and at significant risk of neurological  worsening, death and care requires constant monitoring of vital signs, hemodynamics,respiratory and cardiac monitoring, neurological assessment, discussion with family, other specialists and medical decision making of high complexity. I spent 40 minutes of neurocritical care time  in the care of  this patient.  Roland Rack, MD [facility] [phone]  If 7pm- 7am, please page neurology on call at [phone].  11/20/2013  9:17 AM    EEG showed GPEDs, given the correlation with clonic activity, started propofol with bolus doses to control  activity. She continues to have occasional GPEDs, but the frequency is at least 3 seconds between discharges and generalized suppression in the interim. I would favor continuing suppression tonight, will reconsider agressiveness of therapy tomorrow if ictal pattern resumes.    Roland Rack, MD [facility] [phone]  If 7pm- 7am, please page neurology on call at [phone].

## 2013-11-20 NOTE — Progress Notes (Signed)
eLink Physician-Brief Progress Note Patient Name: Karina Weber DOB: [birth year] MRN: [id number]  Date of Service  11/20/2013   HPI/Events of Note  Patient has rewarmed and now has jerking movement primarily in the face.  Is currently on fentanyl/versed gtts.   eICU Interventions  Plan: Keppra IV scheduled Add propofol sedation  Neuro consult in AM   Intervention Category Major Interventions: Seizures - evaluation and management  DETERDING,ELIZABETH 11/20/2013, 5:15 AM

## 2013-11-20 NOTE — Progress Notes (Signed)
PULMONARY / CRITICAL CARE MEDICINE  Name: Karina Weber MRN: [id number] DOB: 1956-07-01    ADMISSION DATE:  11/10/2013 CONSULTATION DATE:  11/17/2013  REFERRING MD :  EDP PRIMARY SERVICE: PCCM  CHIEF COMPLAINT:  PEA Arrest  BRIEF PATIENT DESCRIPTION: 58 year old female presented 2/16 after being found down in PEA arrest by family. ROSC with CPR and epi x 2.   SIGNIFICANT EVENTS / STUDIES:  2/16  PEA arrest, ~20 mins downtime, ROSC with epi x 2  2/16  Head CT >>> 2/16  Chest CTA >>>  LINES / TUBES: OETT 2/16 >>> OGT 2/16 >>> L [place] TLC 2/16 >>> L rad a-line 2/16 >>>  CULTURES: Blood 2/16 >>> Urine 2/16 >>> Respiratory 2/16 >>>  ANTIBIOTICS: None  SUBJECTIVE: Seizure activity immediately post warming.  VITAL SIGNS: Temp:  [91.4 F (33 C)-99.7 F (37.6 C)] 99.5 F (37.5 C) (02/18 1030) Pulse Rate:  [74-118] 76 (02/18 1030) Resp:  [18-27] 20 (02/18 1030) BP: (78-193)/(27-159) 147/50 mmHg (02/18 1000) SpO2:  [89 %-100 %] 99 % (02/18 1030) Arterial Line BP: (68-210)/(46-94) 210/83 mmHg (02/18 1030) FiO2 (%):  [50 %] 50 % (02/18 0847) Weight:  [47.9 kg (105 lb 9.6 oz)] 47.9 kg (105 lb 9.6 oz) (02/18 0440)  HEMODYNAMICS: CVP:  [3 mmHg-18 mmHg] 18 mmHg  VENTILATOR SETTINGS: Vent Mode:  [-] PRVC FiO2 (%):  [50 %] 50 % Set Rate:  [20 bmp] 20 bmp Vt Set:  [500 mL] 500 mL PEEP:  [5 cmH20] 5 cmH20 Plateau Pressure:  [20 cmH20-27 cmH20] 27 cmH20  INTAKE / OUTPUT: Intake/Output     02/17 0701 - 02/18 0700 02/18 0701 - 02/19 0700   I.V. (mL/kg) 2057.9 (43) 235.4 (4.9)   NG/GT 85    IV Piggyback 105 70   Total Intake(mL/kg) 2247.9 (46.9) 305.4 (6.4)   Urine (mL/kg/hr) 910 (0.8)    Emesis/NG output 425 (0.4)    Total Output 1335     Net +912.9 +305.4         PHYSICAL EXAMINATION: General:  Intubated, sedated female with facial seizure like activity Neuro:  Unresponsive, does not follow commands.  HEENT:  [place]/AT, pupils pinpoint.  Cardiovascular:  RRR Lungs:  No  wheeze Abdomen:  Soft, non-distended Musculoskeletal:  Intact Skin:  Intact  LABS:  CBC  Recent Labs Lab 11/17/2013 1800  11/30/2013 2220 11/19/13 0430 11/20/13 0400  WBC 10.3  --   --  12.5* 13.2*  HGB 13.8  < > 16.7* 14.8 14.7  HCT 41.7  < > 49.0* 44.0 43.2  PLT 233  --   --  255 258  < > = values in this interval not displayed. Coag's  Recent Labs Lab 11/07/2013 1443 11/29/2013 1800 11/19/13 0002  APTT 28 32 31  INR 1.17 1.10 1.04   BMET  Recent Labs Lab 11/19/13 2000 11/20/13 11/20/13 0400  NA 137 137 135*  K 3.9 3.9 4.0  CL 100 100 100  CO2 20 19 19   BUN 16 17 17   CREATININE 0.48* 0.58 0.59  GLUCOSE 90 65* 81   Electrolytes  Recent Labs Lab 11/17/2013 1800  11/19/13 2000 11/20/13 11/20/13 0400  CALCIUM 7.4*  < > 7.9* 7.8* 7.8*  MG 1.4*  --   --   --  1.0*  PHOS 3.6  --   --   --  4.0  < > = values in this interval not displayed. Sepsis Markers  Recent Labs Lab 11/23/2013 2004 11/19/13 0002 11/19/13 1154  LATICACIDVEN 6.5* 1.0 1.0   ABG  Recent Labs Lab 11/24/2013 1816 11/24/2013 2216 11/20/13 0326  PHART 7.335* 7.456* 7.406  PCO2ART 23.7* 26.2* 36.0  PO2ART 123.0* 77.0* 92.6   Liver Enzymes  Recent Labs Lab 11/28/2013 1443  AST 70*  ALT 19  ALKPHOS 157*  BILITOT <0.2*  ALBUMIN 2.5*   Cardiac Enzymes  Recent Labs Lab 11/25/2013 1800 11/07/2013 2210 11/19/13 0002 11/19/13 0804  TROPONINI <0.30 <0.30 <0.30 <0.30  PROBNP 1861.0*  --   --   --    Glucose  Recent Labs Lab 11/19/13 1826 11/19/13 1948 11/20/13 0023 11/20/13 0103 11/20/13 0357 11/20/13 0754  GLUCAP 84 82 67* 96 88 84   Imaging  CXR: 2/16 >>> Hardware in good position, no overt airspace disease   ASSESSMENT / PLAN:  PULMONARY A: Acute respiratory failure in setting of cardiac arrests Possible pulmonary embolism given cardia arrest and history of malignancy COPD without evidence of exacerbation Lung Ca s/p XRT P:   - Goal pH>7.30, SpO2>92 - Continue full  vent support given seizure activity and neuro status. - VAP bundle - Hold SBT while on Nimbex - Trend ABG/CXR - Albuterol / Atrovent - No indication for systemic steroids. - Chest CTA: r/o PE and evaluate for lung mass  CARDIOVASCULAR A:  PEA cardiac arrest in middle age female without cardiac history and no EKG changes - doubt primary cardiac etiology Normotensive P:  - Goal MAP > 85 while on hypothermia. - Levophed for target above specially now that requiring medications for seizure control. - Trend lactate / troponin. - Defer interventional cardiac evaluation ( also received IV contrast for CTA ) until neuro status is addressed. - TTE done and noted.  RENAL A:   Hyperkalemia in setting of acute acidosis, improved P:   - Trend BMP. - NS at 50 ml/hr. - Replace electrolytes as indicated.  GASTROINTESTINAL / GU A:   Fecal material from vagina when Foley is attempted? Rectovaginal fistula? Nutrition GI Px P:   - Protonix. - TF per Nutritionist. - Pelvic CT with rectal contrast when neuro status is addressed.  HEMATOLOGIC A:   Hemoconcentration P:  - Trend CBC - SCDs  INFECTIOUS A:   No evidence of overt infection P:   - Panculture NTD - Observe off abx  ENDOCRINE A:   Hyperglycemia  P:   - SSI  NEUROLOGIC A:   Acute encephalopathy Possible anoxia Hx polysubstance abuse Chronic alcoholism. P:   - Hypothermia protocol - Keppra, propofol and ativan given for seizure control - Head CT negative initially. - Neuro consult called. - EEG being done. - Added thiamine, folate and MVI. - Neuro to prgnosticate.  Spoke with son, he knows his mother would not want prolonged life support or additional CPR/cardioversion.  Will change to LCB and once neuro completes work up and prognostications then will make recommendations to son.  I have personally obtained history, examined patient, evaluated and interpreted laboratory and imaging results, reviewed medical  records, formulated assessment / plan and placed orders.  CRITICAL CARE:  The patient is critically ill with multiple organ systems failure and requires high complexity decision making for assessment and support, frequent evaluation and titration of therapies, application of advanced monitoring technologies and extensive interpretation of multiple databases. Critical Care Time devoted to patient care services described in this note is 35 minutes.   Rush Farmer, M.D. [facility] Pulmonary/Critical Care Medicine. Pager: [phone]. After hours pager: [phone].

## 2013-11-20 NOTE — Progress Notes (Signed)
LTVM up and running

## 2013-11-20 NOTE — Progress Notes (Signed)
Nursing: Versed infusion 40 ml  wasted down sink and fentanyl 170 ml infusion wasted down sink. Both witness by this RN and Elvera Lennox RN.

## 2013-11-21 ENCOUNTER — Inpatient Hospital Stay (HOSPITAL_COMMUNITY): Payer: Medicare Other

## 2013-11-21 DIAGNOSIS — I469 Cardiac arrest, cause unspecified: Secondary | ICD-10-CM

## 2013-11-21 DIAGNOSIS — R0989 Other specified symptoms and signs involving the circulatory and respiratory systems: Secondary | ICD-10-CM

## 2013-11-21 DIAGNOSIS — F17200 Nicotine dependence, unspecified, uncomplicated: Secondary | ICD-10-CM

## 2013-11-21 DIAGNOSIS — J9600 Acute respiratory failure, unspecified whether with hypoxia or hypercapnia: Secondary | ICD-10-CM

## 2013-11-21 DIAGNOSIS — C3410 Malignant neoplasm of upper lobe, unspecified bronchus or lung: Secondary | ICD-10-CM

## 2013-11-21 DIAGNOSIS — R0609 Other forms of dyspnea: Secondary | ICD-10-CM

## 2013-11-21 DIAGNOSIS — J449 Chronic obstructive pulmonary disease, unspecified: Secondary | ICD-10-CM

## 2013-11-21 LAB — GLUCOSE, CAPILLARY
GLUCOSE-CAPILLARY: 101 mg/dL — AB (ref 70–99)
GLUCOSE-CAPILLARY: 67 mg/dL — AB (ref 70–99)
GLUCOSE-CAPILLARY: 52 mg/dL — AB (ref 70–99)
GLUCOSE-CAPILLARY: 115 mg/dL — AB (ref 70–99)
Glucose-Capillary: 193 mg/dL — ABNORMAL HIGH (ref 70–99)
Glucose-Capillary: 51 mg/dL — ABNORMAL LOW (ref 70–99)
Glucose-Capillary: 61 mg/dL — ABNORMAL LOW (ref 70–99)
Glucose-Capillary: 69 mg/dL — ABNORMAL LOW (ref 70–99)
Glucose-Capillary: 73 mg/dL (ref 70–99)
Glucose-Capillary: 86 mg/dL (ref 70–99)
Glucose-Capillary: 93 mg/dL (ref 70–99)

## 2013-11-21 LAB — BLOOD GAS, ARTERIAL
Acid-base deficit: 6.6 mmol/L — ABNORMAL HIGH (ref 0.0–2.0)
Bicarbonate: 17.1 mEq/L — ABNORMAL LOW (ref 20.0–24.0)
DRAWN BY: 31101
FIO2: 40 %
LHR: 20 {breaths}/min
MECHVT: 500 mL
O2 Saturation: 97 %
PEEP: 5 cmH2O
PO2 ART: 84.1 mmHg (ref 80.0–100.0)
Patient temperature: 95
TCO2: 17.9 mmol/L (ref 0–100)
pCO2 arterial: 24.6 mmHg — ABNORMAL LOW (ref 35.0–45.0)
pH, Arterial: 7.446 (ref 7.350–7.450)

## 2013-11-21 LAB — PHOSPHORUS: Phosphorus: 3.9 mg/dL (ref 2.3–4.6)

## 2013-11-21 LAB — BASIC METABOLIC PANEL
BUN: 22 mg/dL (ref 6–23)
CHLORIDE: 97 meq/L (ref 96–112)
CO2: 17 mEq/L — ABNORMAL LOW (ref 19–32)
Calcium: 7.4 mg/dL — ABNORMAL LOW (ref 8.4–10.5)
Creatinine, Ser: 1.2 mg/dL — ABNORMAL HIGH (ref 0.50–1.10)
GFR calc non Af Amer: 50 mL/min — ABNORMAL LOW (ref >90)
GFR, EST AFRICAN AMERICAN: 57 mL/min — AB (ref >90)
GLUCOSE: 95 mg/dL (ref 70–99)
POTASSIUM: 3.3 meq/L — AB (ref 3.7–5.3)
Sodium: 133 mEq/L — ABNORMAL LOW (ref 137–147)

## 2013-11-21 LAB — CBC
HEMATOCRIT: 40.8 % (ref 36.0–46.0)
HEMOGLOBIN: 14 g/dL (ref 12.0–15.0)
MCH: 30.2 pg (ref 26.0–34.0)
MCHC: 34.3 g/dL (ref 30.0–36.0)
MCV: 88.1 fL (ref 78.0–100.0)
Platelets: 248 10*3/uL (ref 150–400)
RBC: 4.63 MIL/uL (ref 3.87–5.11)
RDW: 19.3 % — ABNORMAL HIGH (ref 11.5–15.5)
WBC: 17.1 10*3/uL — AB (ref 4.0–10.5)

## 2013-11-21 LAB — CULTURE, RESPIRATORY W GRAM STAIN

## 2013-11-21 LAB — MAGNESIUM: Magnesium: 1 mg/dL — ABNORMAL LOW (ref 1.5–2.5)

## 2013-11-21 MED ORDER — LORAZEPAM 2 MG/ML IJ SOLN
INTRAMUSCULAR | Status: AC
  Filled 2013-11-21: qty 2

## 2013-11-21 MED ORDER — NOREPINEPHRINE BITARTRATE 1 MG/ML IJ SOLN
0.5000 ug/min | INTRAVENOUS | Status: DC
  Administered 2013-11-21: 20 ug/min via INTRAVENOUS
  Administered 2013-11-22: 30 ug/min via INTRAVENOUS
  Administered 2013-11-22: 38 ug/min via INTRAVENOUS
  Filled 2013-11-21 (×6): qty 16

## 2013-11-21 MED ORDER — LORAZEPAM 2 MG/ML IJ SOLN
4.0000 mg | Freq: Once | INTRAMUSCULAR | Status: AC
  Administered 2013-11-21: 4 mg via INTRAVENOUS

## 2013-11-21 MED ORDER — DEXTROSE 50 % IV SOLN
INTRAVENOUS | Status: AC
  Filled 2013-11-21: qty 50

## 2013-11-21 MED ORDER — DEXTROSE 50 % IV SOLN
25.0000 mL | Freq: Once | INTRAVENOUS | Status: AC | PRN
  Administered 2013-11-21: 25 mL via INTRAVENOUS
  Filled 2013-11-21: qty 50

## 2013-11-21 MED ORDER — MAGNESIUM SULFATE 40 MG/ML IJ SOLN
2.0000 g | Freq: Once | INTRAMUSCULAR | Status: AC
  Administered 2013-11-21: 2 g via INTRAVENOUS
  Filled 2013-11-21: qty 50

## 2013-11-21 MED ORDER — SODIUM CHLORIDE 0.9 % IV SOLN: INTRAVENOUS | Status: DC

## 2013-11-21 MED ORDER — DEXTROSE 50 % IV SOLN
INTRAVENOUS | Status: AC
  Administered 2013-11-21 (×2): 50 mL
  Filled 2013-11-21: qty 50

## 2013-11-21 MED ORDER — DEXTROSE-NACL 5-0.45 % IV SOLN
INTRAVENOUS | Status: DC
  Administered 2013-11-21: 75 mL/h via INTRAVENOUS

## 2013-11-21 MED ORDER — PROPOFOL 10 MG/ML IV EMUL
100.0000 ug/kg/min | INTRAVENOUS | Status: DC
  Administered 2013-11-21 (×2): 100 ug/kg/min via INTRAVENOUS
  Administered 2013-11-21: 20 ug/kg/min via INTRAVENOUS
  Administered 2013-11-21 – 2013-11-22 (×3): 100 ug/kg/min via INTRAVENOUS
  Filled 2013-11-21: qty 100

## 2013-11-21 MED ORDER — POTASSIUM CHLORIDE 20 MEQ/15ML (10%) PO LIQD
40.0000 meq | Freq: Three times a day (TID) | ORAL | Status: AC
  Administered 2013-11-21 (×2): 40 meq
  Filled 2013-11-21 (×2): qty 30

## 2013-11-21 MED ORDER — POTASSIUM CHLORIDE 10 MEQ/50ML IV SOLN
10.0000 meq | INTRAVENOUS | Status: AC
  Administered 2013-11-21 (×2): 10 meq via INTRAVENOUS
  Filled 2013-11-21 (×2): qty 50

## 2013-11-21 NOTE — Progress Notes (Signed)
Subjective: EEG shows monomorphic GPED pattern with frequency of 1.5 Hz.   Exam: Filed Vitals:   11/21/13 0700  BP: 135/74  Pulse: 66  Temp: 94.1 F (34.5 C)  Resp: 20   Gen: In bed, on propofol, intubated MS: Comatose [id number], corneals absent Further testing impaired by propofol   Impression: 58 yo F s/p cardiac arrest with post-hypoxic status epilepticus and monomorphic GPED pattern on EEG. This was treated with large doses of ativan and propofol, but the GPED pattern continues. The other option at this point would be barbiturate which I feel would very likely mean tracheostomy and PEG.  Given the dismal prognosis associated with GPEDs, I do not think that it would change her ultimate outcome.   Recommendations: 1) Will discuss with family poor prognosis suggested by clinical picture and EEG.   Roland Rack, MD [facility] [phone]  If 7pm- 7am, please page neurology on call at [phone].

## 2013-11-21 NOTE — Progress Notes (Signed)
Day 2 LTVM EEG in progress.

## 2013-11-21 NOTE — Progress Notes (Signed)
Hypoglycemic Event  CBG: 61  Treatment: D 50- 25 cc   Symptoms: none  Follow-up CBG : Time:1810 CBG Result:115  Possible Reasons for Event: NPO  Comments/MD notifiedDr. Arcola Jansky, [place]  Remember to initiate Hypoglycemia Order Set & complete

## 2013-11-21 NOTE — Progress Notes (Signed)
[facility] ADULT ICU REPLACEMENT PROTOCOL FOR AM LAB REPLACEMENT ONLY  The patient does apply for the [facility] Adult ICU Electrolyte Replacment Protocol based on the criteria listed below:   1. Is GFR >/= 40 ml/min? yes  Patient's GFR today is 57 2. Is urine output >/= 0.5 ml/kg/hr for the last 6 hours? yes Patient's UOP is 1.0 ml/kg/hr 3. Is BUN < 60 mg/dL? yes  Patient's BUN today is 22 4. Abnormal electrolyte(s): Potassium 5. Ordered repletion with: Potassium per Protocol 6 Neal Trulson P 11/21/2013 4:29 AM

## 2013-11-21 NOTE — Progress Notes (Signed)
PULMONARY / CRITICAL CARE MEDICINE  Name: Karina Weber MRN: [id number] DOB: 05/01/1956    ADMISSION DATE:  11/11/2013 CONSULTATION DATE:  11/12/2013  REFERRING MD :  EDP PRIMARY SERVICE: PCCM  CHIEF COMPLAINT:  PEA Arrest  BRIEF PATIENT DESCRIPTION: 58 year old female presented 2/16 after being found down in PEA arrest by family. ROSC with CPR and epi x 2.   SIGNIFICANT EVENTS / STUDIES:  2/16  PEA arrest, ~20 mins downtime, ROSC with epi x 2  2/16  Head CT >>> 2/16  Chest CTA >>>  LINES / TUBES: OETT 2/16 >>> OGT 2/16 >>> L [place] TLC 2/16 >>> L rad a-line 2/16 >>>  CULTURES: Blood 2/16 >>> Urine 2/16 >>> Respiratory 2/16 >>>  ANTIBIOTICS: None  SUBJECTIVE: Seizure activity immediately post warming but improved today.  VITAL SIGNS: Temp:  [94.1 F (34.5 C)-99 F (37.2 C)] 96.8 F (36 C) (02/19 1100) Pulse Rate:  [61-83] 73 (02/19 1100) Resp:  [18-22] 20 (02/19 1100) BP: (86-146)/(47-79) 146/79 mmHg (02/19 1100) SpO2:  [94 %-99 %] 98 % (02/19 1100) Arterial Line BP: (81-140)/(33-67) 140/67 mmHg (02/19 1100) FiO2 (%):  [40 %] 40 % (02/19 1100) Weight:  [50.33 kg (110 lb 15.3 oz)] 50.33 kg (110 lb 15.3 oz) (02/19 0400)  HEMODYNAMICS: CVP:  [5 mmHg-10 mmHg] 9 mmHg  VENTILATOR SETTINGS: Vent Mode:  [-] PRVC FiO2 (%):  [40 %] 40 % Set Rate:  [20 bmp] 20 bmp Vt Set:  [500 mL] 500 mL PEEP:  [5 cmH20] 5 cmH20 Plateau Pressure:  [19 cmH20-34 cmH20] 34 cmH20  INTAKE / OUTPUT: Intake/Output     02/18 0701 - 02/19 0700 02/19 0701 - 02/20 0700   I.V. (mL/kg) 2738.1 (54.4) 518.5 (10.3)   NG/GT 30    IV Piggyback 615    Total Intake(mL/kg) 3383.1 (67.2) 518.5 (10.3)   Urine (mL/kg/hr) 550 (0.5) 35 (0.1)   Emesis/NG output 500 (0.4)    Total Output 1050 35   Net +2333.1 +483.5         PHYSICAL EXAMINATION: General:  Intubated, sedated female with facial seizure like activity Neuro:  Unresponsive, does not follow commands.  HEENT:  [place]/AT, pupils pinpoint.   Cardiovascular:  RRR Lungs:  No wheeze Abdomen:  Soft, non-distended Musculoskeletal:  Intact Skin:  Intact  LABS:  CBC  Recent Labs Lab 11/19/13 0430 11/20/13 0400 11/21/13 0330  WBC 12.5* 13.2* 17.1*  HGB 14.8 14.7 14.0  HCT 44.0 43.2 40.8  PLT 255 258 248   Coag's  Recent Labs Lab 11/30/2013 1443 11/27/2013 1800 11/19/13 0002  APTT 28 32 31  INR 1.17 1.10 1.04   BMET  Recent Labs Lab 11/20/13 11/20/13 0400 11/21/13 0330  NA 137 135* 133*  K 3.9 4.0 3.3*  CL 100 100 97  CO2 19 19 17*  BUN 17 17 22   CREATININE 0.58 0.59 1.20*  GLUCOSE 65* 81 95   Electrolytes  Recent Labs Lab 11/15/2013 1800  11/20/13 11/20/13 0400 11/21/13 0330  CALCIUM 7.4*  < > 7.8* 7.8* 7.4*  MG 1.4*  --   --  1.0* 1.0*  PHOS 3.6  --   --  4.0 3.9  < > = values in this interval not displayed. Sepsis Markers  Recent Labs Lab 11/17/2013 2004 11/19/13 0002 11/19/13 1154  LATICACIDVEN 6.5* 1.0 1.0   ABG  Recent Labs Lab 11/09/2013 2216 11/20/13 0326 11/21/13 0500  PHART 7.456* 7.406 7.446  PCO2ART 26.2* 36.0 24.6*  PO2ART 77.0* 92.6 84.1  Liver Enzymes  Recent Labs Lab 11/25/2013 1443  AST 70*  ALT 19  ALKPHOS 157*  BILITOT <0.2*  ALBUMIN 2.5*   Cardiac Enzymes  Recent Labs Lab 11/07/2013 1800 11/26/2013 2210 11/19/13 0002 11/19/13 0804  TROPONINI <0.30 <0.30 <0.30 <0.30  PROBNP 1861.0*  --   --   --    Glucose  Recent Labs Lab 11/20/13 1207 11/20/13 1622 11/20/13 2009 11/20/13 2349 11/21/13 0331 11/21/13 0841  GLUCAP 93 87 92 86 93 73   Imaging  CXR: 2/16 >>> Hardware in good position, no overt airspace disease   ASSESSMENT / PLAN:  PULMONARY A: Acute respiratory failure in setting of cardiac arrests Possible pulmonary embolism given cardia arrest and history of malignancy COPD without evidence of exacerbation Lung Ca s/p XRT P:   - Goal pH>7.30, SpO2>92 - Continue full vent support given seizure activity and neuro status. - VAP bundle -  Hold SBT while on Nimbex - Trend ABG/CXR - Albuterol / Atrovent - No indication for systemic steroids. - Chest CTA: r/o PE and evaluate for lung mass after neuro status is addressed.  CARDIOVASCULAR A:  PEA cardiac arrest in middle age female without cardiac history and no EKG changes - doubt primary cardiac etiology Normotensive P:  - Goal MAP > 85 while on hypothermia. - Levophed for target above specially now that requiring medications for seizure control. - Trend lactate / troponin. - Defer interventional cardiac evaluation ( also received IV contrast for CTA ) until neuro status is addressed. - TTE done and noted.  RENAL A:   Hyperkalemia in setting of acute acidosis, improved P:   - Trend BMP. - NS at 50 ml/hr. - Replace electrolytes as indicated.  GASTROINTESTINAL / GU A:   Fecal material from vagina when Foley is attempted? Rectovaginal fistula? Nutrition GI Px P:   - Protonix. - TF per Nutritionist. - Pelvic CT with rectal contrast when neuro status is addressed.  HEMATOLOGIC A:   Hemoconcentration P:  - Trend CBC. - SCDs.  INFECTIOUS A:   No evidence of overt infection P:   - Panculture NTD. - Observe off abx.  ENDOCRINE A:   Hyperglycemia  P:   - SSI  NEUROLOGIC A:   Acute encephalopathy Possible anoxia Hx polysubstance abuse Chronic alcoholism. P:   - Hypothermia protocol - Keppra, propofol and ativan given for seizure control - Head CT negative initially. - Neuro consult appreciated, very poor prognosis, made LCB yesterday after conversation with son, once neuro completes prognostic work then will recommend terminal extubation. - Added thiamine, folate and MVI.  Spoke with son, he knows his mother would not want prolonged life support or additional CPR/cardioversion.  Will change to LCB and once neuro completes work up and prognostications then will make recommendations to son.  I have personally obtained history, examined patient,  evaluated and interpreted laboratory and imaging results, reviewed medical records, formulated assessment / plan and placed orders.  CRITICAL CARE:  The patient is critically ill with multiple organ systems failure and requires high complexity decision making for assessment and support, frequent evaluation and titration of therapies, application of advanced monitoring technologies and extensive interpretation of multiple databases. Critical Care Time devoted to patient care services described in this note is 35 minutes.   Rush Farmer, M.D. [facility] Pulmonary/Critical Care Medicine. Pager: [phone]. After hours pager: [phone].

## 2013-11-21 NOTE — Progress Notes (Signed)
eLink Physician-Brief Progress Note Patient Name: Karina Weber DOB: 21-May-1956 MRN: [id number]  Date of Service  11/21/2013   HPI/Events of Note   Hypoglycemia  eICU Interventions  D5 1/2 NS   Intervention Category Intermediate Interventions: Other: (Hypoglycemia)  MCQUAID, DOUGLAS 11/21/2013, 4:41 PM

## 2013-11-21 NOTE — Progress Notes (Signed)
eLink Physician-Brief Progress Note Patient Name: Karina Weber DOB: 1955/11/30 MRN: [id number]  Date of Service  11/21/2013   HPI/Events of Note  Hypomag   eICU Interventions  mag replaced   Intervention Category Intermediate Interventions: Electrolyte abnormality - evaluation and management  [name] 11/21/2013, 4:33 AM

## 2013-11-21 NOTE — Progress Notes (Signed)
Discussed with Dr. Nelda Marseille- pt's temps.  Orders received to warm pt to 37. Arctic sun machine started and set to 37 degrees with two RN's . P. Aurie Harroun RN and Rudene Anda RN

## 2013-11-21 NOTE — Progress Notes (Signed)
Dr. Lake Bells notified of CBG results for today and current CBG of 52

## 2013-11-21 NOTE — Progress Notes (Signed)
Hypoglycemic Event  CBG: 69  50% dextrose  25cc  Symptoms: none  Follow-up CBG: [id number] CBG Result:101  Possible Reasons for Event: NPO Comments/MD notified:per protocol    Dianah Field  Remember to initiate Hypoglycemia Order Set & complete

## 2013-11-22 ENCOUNTER — Inpatient Hospital Stay (HOSPITAL_COMMUNITY): Payer: Medicare Other

## 2013-11-22 LAB — BLOOD GAS, ARTERIAL
ACID-BASE DEFICIT: 9 mmol/L — AB (ref 0.0–2.0)
Bicarbonate: 16.3 mEq/L — ABNORMAL LOW (ref 20.0–24.0)
DRAWN BY: 31101
FIO2: 40 %
LHR: 20 {breaths}/min
O2 Saturation: 94.6 %
PATIENT TEMPERATURE: 98.6
PEEP: 5 cmH2O
TCO2: 17.4 mmol/L (ref 0–100)
VT: 500 mL
pCO2 arterial: 35.1 mmHg (ref 35.0–45.0)
pH, Arterial: 7.289 — ABNORMAL LOW (ref 7.350–7.450)
pO2, Arterial: 84.4 mmHg (ref 80.0–100.0)

## 2013-11-22 LAB — BASIC METABOLIC PANEL
BUN: 26 mg/dL — AB (ref 6–23)
CO2: 15 meq/L — AB (ref 19–32)
Calcium: 7.7 mg/dL — ABNORMAL LOW (ref 8.4–10.5)
Chloride: 101 mEq/L (ref 96–112)
Creatinine, Ser: 1.45 mg/dL — ABNORMAL HIGH (ref 0.50–1.10)
GFR calc Af Amer: 46 mL/min — ABNORMAL LOW (ref >90)
GFR calc non Af Amer: 39 mL/min — ABNORMAL LOW (ref >90)
GLUCOSE: 47 mg/dL — AB (ref 70–99)
POTASSIUM: 5.5 meq/L — AB (ref 3.7–5.3)
Sodium: 133 mEq/L — ABNORMAL LOW (ref 137–147)

## 2013-11-22 LAB — CBC
HEMATOCRIT: 43.7 % (ref 36.0–46.0)
Hemoglobin: 14.9 g/dL (ref 12.0–15.0)
MCH: 30.3 pg (ref 26.0–34.0)
MCHC: 34.1 g/dL (ref 30.0–36.0)
MCV: 89 fL (ref 78.0–100.0)
Platelets: 245 10*3/uL (ref 150–400)
RBC: 4.91 MIL/uL (ref 3.87–5.11)
RDW: 19.3 % — ABNORMAL HIGH (ref 11.5–15.5)
WBC: 20.7 10*3/uL — ABNORMAL HIGH (ref 4.0–10.5)

## 2013-11-22 LAB — GLUCOSE, CAPILLARY
GLUCOSE-CAPILLARY: 50 mg/dL — AB (ref 70–99)
Glucose-Capillary: 108 mg/dL — ABNORMAL HIGH (ref 70–99)
Glucose-Capillary: 192 mg/dL — ABNORMAL HIGH (ref 70–99)
Glucose-Capillary: 203 mg/dL — ABNORMAL HIGH (ref 70–99)
Glucose-Capillary: 63 mg/dL — ABNORMAL LOW (ref 70–99)

## 2013-11-22 LAB — PHOSPHORUS: PHOSPHORUS: 4.6 mg/dL (ref 2.3–4.6)

## 2013-11-22 LAB — MAGNESIUM: Magnesium: 1.5 mg/dL (ref 1.5–2.5)

## 2013-11-22 MED ORDER — MORPHINE SULFATE 10 MG/ML IJ SOLN
10.0000 mg/h | INTRAVENOUS | Status: DC
  Administered 2013-11-22: 10 mg/h via INTRAVENOUS
  Filled 2013-11-22: qty 10

## 2013-11-22 MED ORDER — DEXTROSE 10 % IV SOLN
INTRAVENOUS | Status: DC
  Administered 2013-11-22: 01:00:00 via INTRAVENOUS

## 2013-11-22 MED ORDER — DEXTROSE 50 % IV SOLN
50.0000 mL | Freq: Once | INTRAVENOUS | Status: AC | PRN
Start: 2013-11-22 — End: 2013-11-22
  Administered 2013-11-22: 50 mL via INTRAVENOUS

## 2013-11-22 MED ORDER — MORPHINE BOLUS VIA INFUSION
5.0000 mg | INTRAVENOUS | Status: DC | PRN
Start: 2013-11-22 — End: 2013-11-22
  Filled 2013-11-22: qty 20

## 2013-11-22 MED ORDER — DEXTROSE 50 % IV SOLN
INTRAVENOUS | Status: AC
  Filled 2013-11-22: qty 50

## 2013-11-22 MED ORDER — DEXTROSE 50 % IV SOLN
50.0000 mL | Freq: Once | INTRAVENOUS | Status: AC | PRN
  Administered 2013-11-22: 50 mL via INTRAVENOUS

## 2013-11-25 LAB — CULTURE, BLOOD (ROUTINE X 2)
CULTURE: NO GROWTH
Culture: NO GROWTH

## 2013-11-25 LAB — GLUCOSE, CAPILLARY: Glucose-Capillary: 36 mg/dL — CL (ref 70–99)

## 2013-12-01 NOTE — Progress Notes (Signed)
Inpatient Diabetes Program Recommendations  AACE/ADA: New Consensus Statement on Inpatient Glycemic Control (2013)  Target Ranges:  Prepandial:   less than 140 mg/dL      Peak postprandial:   less than 180 mg/dL (1-2 hours)      Critically ill patients:  140 - 180 mg/dL  Results for Karina Weber, Karina Weber (MRN [id number]) as of 2013-12-01 10:14  Ref. Range 11/21/2013 17:28 11/21/2013 18:10 11/21/2013 19:50 11/21/2013 20:10 12-01-2013 00:25 2013/12/01 00:45 December 01, 2013 04:03 December 01, 2013 04:28  Glucose-Capillary Latest Range: 70-99 mg/dL 61 (L) 115 (H) 51 (L) 193 (H) 63 (L) 203 (H) 50 (L) 192 (H)   Recommend discontinuing Novolog q 4.  Thank you  Raoul Pitch BSN, RN,CDE Inpatient Diabetes Coordinator [phone] (team pager)

## 2013-12-01 NOTE — Progress Notes (Signed)
Hypoglycemic Event  CBG:51 Treatment: D50 IV 50 mL  Symptoms: None  Follow-up CBG: Time:2010 CBG Result:193  Possible Reasons for Event: Other: NPO      Karina Weber M  Remember to initiate Hypoglycemia Order Set & complete

## 2013-12-01 NOTE — Procedures (Signed)
Extubation Procedure Note  Patient Details:   Name: Karina Weber DOB: 1956/09/06 MRN: [id number]  Pt terminally extubated to RA per order.   Evaluation  O2 sats: transiently fell during during procedure Complications: No apparent complications Patient did tolerate procedure well. Bilateral Breath Sounds: Diminished;Rhonchi Suctioning: Airway No  Jetty Peeks Dec 07, 2013, 1:31 PM

## 2013-12-01 NOTE — Progress Notes (Signed)
Hypoglycemic Event  CBG: 63  Treatment: D50 IV 50 mL  Symptoms: None  Follow-up CBG: Time:0046 CBG Result:203  Possible Reasons for Event: Other: NPO  Comments/MD notified: Dr. Earnest Conroy called. New orders for 10% Dextrose at 123ml/hr given.    Karina Weber  Remember to initiate Hypoglycemia Order Set & complete

## 2013-12-01 NOTE — Progress Notes (Addendum)
Subjective: GPEDs have ceased on EEG, now in burst suppression with very subtle bursts consisting of delta activity.   Exam: Filed Vitals:   12-06-2013 0800  BP: 101/53  Pulse: 87  Temp: 98.6 F (37 C)  Resp: 18   Gen: In bed, on propofol, intubated MS: Comatose PJ:SRPRX, corneals absent Further testing impaired by propofol She continues to have periodic subtle myoclonic movements without electrographic discharge.    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 This is associated with a dismal prognosis. GPEDs have resolved, but the EEG is very flat. I continue to suspect that her prognosis is dismal.  Recommendations: 1) Will discuss with family poor prognosis suggested by clinical picture and EEG.   Roland Rack, MD [facility] [phone]  If 7pm- 7am, please page neurology on call at [phone].      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 San-juan, , K.H. Chiappa, D.J. Costello, A.J. Landry Mellow

## 2013-12-01 NOTE — Progress Notes (Signed)
Hypoglycemic Event  CBG: 50  Treatment: D50 IV 50 mL  Symptoms: None  Follow-up CBG: Time:0428 CBG Result: 192  Possible Reasons for Event: Other: NPO  Comments/MD notified: Dr. Earnest Conroy notified. Verbal orders given to increase 10% Dextrose to 123ml/hr    Rumor Sun, Barbee Cough  Remember to initiate Hypoglycemia Order Set & complete

## 2013-12-01 NOTE — Progress Notes (Addendum)
PULMONARY / CRITICAL CARE MEDICINE  Name: Karina Weber MRN: [id number] DOB: [birth year]    ADMISSION DATE:  11/12/2013 CONSULTATION DATE:  11/14/2013  REFERRING MD :  EDP PRIMARY SERVICE: PCCM  CHIEF COMPLAINT:  PEA Arrest  BRIEF PATIENT DESCRIPTION: 58 year old female presented 2/16 after being found down in PEA arrest by family. ROSC with CPR and epi x 2.   SIGNIFICANT EVENTS / STUDIES:  2/16  PEA arrest, ~20 mins downtime, ROSC with epi x 2  2/16  Head CT >>> 2/16  Chest CTA >>>  LINES / TUBES: OETT 2/16 >>> OGT 2/16 >>> L [place] TLC 2/16 >>> L rad a-line 2/16 >>>  CULTURES: Blood 2/16 >>> Urine 2/16 >>> Respiratory 2/16 >>>  ANTIBIOTICS: None  SUBJECTIVE: Completely unresponsive today.  VITAL SIGNS: Temp:  [96.8 F (36 C)-98.8 F (37.1 C)] 98.6 F (37 C) (02/20 0800) Pulse Rate:  [73-93] 90 (02/20 0900) Resp:  [0-21] 20 (02/20 0900) BP: (56-192)/(38-82) 117/63 mmHg (02/20 0900) SpO2:  [91 %-98 %] 94 % (02/20 0900) Arterial Line BP: (49-217)/(35-83) 82/44 mmHg (02/20 0900) FiO2 (%):  [40 %] 40 % (02/20 0900) Weight:  [52.03 kg (114 lb 11.3 oz)] 52.03 kg (114 lb 11.3 oz) (02/20 0356)  HEMODYNAMICS: CVP:  [5 mmHg-10 mmHg] 7 mmHg  VENTILATOR SETTINGS: Vent Mode:  [-] PRVC FiO2 (%):  [40 %] 40 % Set Rate:  [20 bmp] 20 bmp Vt Set:  [500 mL] 500 mL PEEP:  [5 cmH20] 5 cmH20 Plateau Pressure:  [18 cmH20-24 cmH20] 19 cmH20  INTAKE / OUTPUT: Intake/Output     02/19 0701 - 02/20 0700 02/20 0701 - 02/21 0700   I.V. (mL/kg) 3287.8 (63.2) 192.8 (3.7)   NG/GT 100    IV Piggyback 400    Total Intake(mL/kg) 3787.8 (72.8) 192.8 (3.7)   Urine (mL/kg/hr) 1415 (1.1)    Emesis/NG output     Total Output 1415     Net +2372.8 +192.8         PHYSICAL EXAMINATION: General:  Intubated, completely unresponsive Neuro:  Unresponsive, does not withdraw to pain.  HEENT:  Holt/AT, pupils pinpoint.  Cardiovascular:  RRR Lungs:  No wheeze Abdomen:  Soft,  non-distended Musculoskeletal:  Intact Skin:  Intact  LABS:  CBC  Recent Labs Lab 11/20/13 0400 11/21/13 0330 19-Dec-2013 0420  WBC 13.2* 17.1* 20.7*  HGB 14.7 14.0 14.9  HCT 43.2 40.8 43.7  PLT 258 248 245   Coag's  Recent Labs Lab 11/14/2013 1443 11/17/2013 1800 11/19/13 0002  APTT 28 32 31  INR 1.17 1.10 1.04   BMET  Recent Labs Lab 11/20/13 0400 11/21/13 0330 12/19/13 0420  NA 135* 133* 133*  K 4.0 3.3* 5.5*  CL 100 97 101  CO2 19 17* 15*  BUN 17 22 26*  CREATININE 0.59 1.20* 1.45*  GLUCOSE 81 95 47*   Electrolytes  Recent Labs Lab 11/20/13 0400 11/21/13 0330 19-Dec-2013 0420  CALCIUM 7.8* 7.4* 7.7*  MG 1.0* 1.0* 1.5  PHOS 4.0 3.9 4.6   Sepsis Markers  Recent Labs Lab 11/06/2013 2004 11/19/13 0002 11/19/13 1154  LATICACIDVEN 6.5* 1.0 1.0   ABG  Recent Labs Lab 11/20/13 0326 11/21/13 0500 Dec 19, 2013 0500  PHART 7.406 7.446 7.289*  PCO2ART 36.0 24.6* 35.1  PO2ART 92.6 84.1 84.4   Liver Enzymes  Recent Labs Lab 11/04/2013 1443  AST 70*  ALT 19  ALKPHOS 157*  BILITOT <0.2*  ALBUMIN 2.5*   Cardiac Enzymes  Recent Labs Lab 11/19/2013 1800  11/29/2013 2210 11/19/13 0002 11/19/13 0804  TROPONINI <0.30 <0.30 <0.30 <0.30  PROBNP 1861.0*  --   --   --    Glucose  Recent Labs Lab 11/21/13 1950 11/21/13 2010 11/20/2013 0025 23-Nov-2013 0045 11/21/2013 0403 11/15/2013 0428  GLUCAP 51* 193* 63* 203* 50* 192*   Imaging  CXR: 2/16 >>> Hardware in good position, no overt airspace disease   ASSESSMENT / PLAN:  PULMONARY A: Acute respiratory failure in setting of cardiac arrests Possible pulmonary embolism given cardia arrest and history of malignancy COPD without evidence of exacerbation Lung Ca s/p XRT P:   - Goal pH>7.30, SpO2>92 - Continue full vent support given seizure activity and neuro status. - VAP bundle - Hold SBT while on Nimbex - Trend ABG/CXR - Albuterol / Atrovent - No indication for systemic steroids. - Chest CTA: r/o  PE and evaluate for lung mass after neuro status is addressed.  CARDIOVASCULAR A:  PEA cardiac arrest in middle age female without cardiac history and no EKG changes - doubt primary cardiac etiology Normotensive P:  - Goal MAP > 85 while on hypothermia. - Levophed for target above specially now that requiring medications for seizure control. - Trend lactate / troponin. - Defer interventional cardiac evaluation ( also received IV contrast for CTA ) until neuro status is addressed. - TTE done and noted.  RENAL A:   Hyperkalemia worsening and acidosis worsening likely due to hypotension and worsening renal failure. P:   - Trend BMP. - Continue NS at 50 ml/hr. - After family meeting, if they insist on continued support then will give kayexalate.  GASTROINTESTINAL / GU A:   Fecal material from vagina when Foley is attempted? Rectovaginal fistula? Moderate malnutrition GI Px P:   - Protonix. - TF per Nutritionist. - Pelvic CT with rectal contrast when neuro status is addressed.  HEMATOLOGIC A:   Hemoconcentration P:  - Trend CBC. - SCDs.  INFECTIOUS A:   No evidence of overt infection P:   - Panculture NTD. - Observe off abx.  ENDOCRINE A:   Hyperglycemia  P:   - SSI  NEUROLOGIC A:   Acute encephalopathy Anoxia, severe Hx polysubstance abuse Chronic alcoholism. P:   - Hypothermia protocol - Keppra, propofol and ativan given for seizure control - Head CT negative initially. - Neuro consult appreciated, very poor prognosis, made LCB yesterday after conversation with son, once neuro completes prognostic work then will recommend terminal extubation. - Added thiamine, folate and MVI.  Partial code for now.  Awaiting family to arrive today and will recommend complete withdrawal and comfort measures.  Will speak with family upon arrival.  I have personally obtained history, examined patient, evaluated and interpreted laboratory and imaging results, reviewed  medical records, formulated assessment / plan and placed orders.  CRITICAL CARE:  The patient is critically ill with multiple organ systems failure and requires high complexity decision making for assessment and support, frequent evaluation and titration of therapies, application of advanced monitoring technologies and extensive interpretation of multiple databases. Critical Care Time devoted to patient care services described in this note is 35 minutes.   Rush Farmer, M.D. [facility] Pulmonary/Critical Care Medicine. Pager: [phone]. After hours pager: [phone].

## 2013-12-01 NOTE — Progress Notes (Signed)
LTVM EEG d/c'ed.

## 2013-12-01 NOTE — Progress Notes (Signed)
NUTRITION FOLLOW UP  DOCUMENTATION CODES  Per approved criteria   -Non-severe (moderate) malnutrition in the context of chronic illness     Intervention:   1. Enteral nutrition; if TFs warranted, recommend initiation of Vital 1.2 @ 20 mL/hr continuous. Advance by 10 mL q 4 hrs to 40 mL/hr goal to provide 1152 kcal, 72g protein, 778 mL free water.  Nutrition Dx:   Inadequate oral intake, ongoing  Monitor:   1.  Enteral nutrition; initiation with tolerance.  Pt to meet >/=90% estimated needs with nutrition support.  2.  Wt/wt change; monitor trends  Assessment:   Pt admitted with PEA arrest.  Pt with h/o polysubstance abuse.  Found down by family.   Patient is currently intubated on ventilator support.  MV: 10.4 L/min Temp (24hrs), Avg:97.7 F (36.5 C), Min:94.6 F (34.8 C), Max:98.8 F (37.1 C)  Propofol: none  Pt with poor prognosis per MD note.  Ongoing discussions with family re: care plan.   RD has provided recommendations should aggressive care continue.   Height: Ht Readings from Last 1 Encounters:  11/30/2013 5\' 1"  (1.549 m)    Weight Status:   Wt Readings from Last 1 Encounters:  11/24/13 114 lb 11.3 oz (52.03 kg)    Re-estimated needs:  Kcal: 1197  Protein: 62-73g  Fluid: >1.5 L/day  Skin: intact  Diet Order: NPO   Intake/Output Summary (Last 24 hours) at 11-24-2013 0924 Last data filed at 11-24-13 0800  Gross per 24 hour  Intake 3736.57 ml  Output   1395 ml  Net 2341.57 ml    Last BM: 2/18   Labs:   Recent Labs Lab 11/20/13 0400 11/21/13 0330 24-Nov-2013 0420  NA 135* 133* 133*  K 4.0 3.3* 5.5*  CL 100 97 101  CO2 19 17* 15*  BUN 17 22 26*  CREATININE 0.59 1.20* 1.45*  CALCIUM 7.8* 7.4* 7.7*  MG 1.0* 1.0* 1.5  PHOS 4.0 3.9 4.6  GLUCOSE 81 95 47*    CBG (last 3)   Recent Labs  24-Nov-2013 0045 November 24, 2013 0403 11-24-2013 0428  GLUCAP 203* 50* 192*    Scheduled Meds: . antiseptic oral rinse  15 mL Mouth Rinse QID  . chlorhexidine   15 mL Mouth Rinse BID  . folic acid  1 mg Intravenous Daily  . insulin aspart  0-15 Units Subcutaneous 6 times per day  . lacosamide (VIMPAT) IV  200 mg Intravenous Q12H  . levETIRAcetam  500 mg Intravenous Q12H  . multivitamin  5 mL Oral Daily  . pantoprazole (PROTONIX) IV  40 mg Intravenous QHS  . thiamine  100 mg Intravenous Daily  . valproate sodium  500 mg Intravenous 3 times per day    Continuous Infusions: . sodium chloride    . dextrose 150 mL/hr (11/24/13 0430)  . nitroGLYCERIN Stopped (11/19/13 2215)  . norepinephrine (LEVOPHED) Adult infusion 38 mcg/min (11/24/2013 0834)  . propofol 100 mcg/kg/min (11/24/13 0100)    Brynda Greathouse, MS RD LDN Clinical Inpatient Dietitian Pager: [phone] Weekend/After hours pager: [phone]

## 2013-12-01 NOTE — Progress Notes (Addendum)
90 cc of morphine wasted from a 100 cc bag of morphine.  Witnessed per two RN's P. Alvord RN

## 2013-12-01 NOTE — Progress Notes (Addendum)
Death pronouncement at 75. Pt pronounced per two RN's. Elsie Saas RN and Miaya Lafontant RN Emotional support given to pt"s family. Clergy and comfort cart for support. All belongings taken per pt's family.

## 2013-12-01 NOTE — Progress Notes (Signed)
Spoke with family bedside at length.  They are in agreement that patient would not want to live this way.  Emphasized comfort.  Will start morphine and terminally extubated.  Rush Farmer, M.D. [facility] Pulmonary/Critical Care Medicine. Pager: [phone]. After hours pager: [phone].

## 2013-12-01 NOTE — Progress Notes (Signed)
Received end of life referral from unit chaplain. This chaplain offered emotional and spiritual support to patient's family. Patient's son, Tyrone Nine, was very quiet and tearful, but her daughter-in-law shared openly. She requested prayer for "patient's smooth passing," which chaplain provided. She shared stories of patient, describing her as "a pistol." She said she had read Scripture and prayed with patient several times. She said they were "saying their goodbyes." Chaplain provided emotional and spiritual support, grief care, presence, empathic listening, and prayer. Family were grateful for support. Will refer to unit chaplain.   Please page for follow up.  Ethelene Browns [phone]

## 2013-12-01 DEATH — deceased

## 2013-12-04 NOTE — Discharge Summary (Addendum)
NAMEADITRI, LOUISCHARLES NO.:  [id number]  MEDICAL RECORD NO.:  [id number]  LOCATION:  [id number]                        FACILITY:  [facility]  PHYSICIAN:  Providence Lanius, MD  DATE OF BIRTH:  1956-05-26  DATE OF ADMISSION:  11/06/2013 DATE OF DISCHARGE:  11/27/2013                              DISCHARGE SUMMARY   DEATH SUMMARY  PRIMARY DIAGNOSIS/CAUSE OF DEATH:  Pulseless electric activity, cardiac arrest.  SECONDARY DIAGNOSIS:  Anoxic brain injury, seizure, acute respiratory failure, acute renal failure, hyperkalemia, hypocalcemia, pulmonary edema, ?rectovaginal fistula.  The patient is a 58 year old female with extensive past medical history, who was found unresponsive at home, so EMS was called, found to be in pulseless electric activity, cardiac arrest.  The patient was brought to the emergency department.  Decision was made that no intervention would be done from a cardiac standpoint at the time of admission.  After a day of the patient being in the intensive care unit, it was clear that patient was making no significant neurologic recovery.  Neurology was consulted and again said that likelihood of any significant improvement is very poor.  This information was shared with the family who informs that the patient repeatedly said she would not want to live if persistently vegetative, at which point, decision was made to make the patient a __________ blue.  Family called for another meeting __________ to comfort care.  At which point, morphine was started, the patient was extubated, expired shortly thereafter with family at the bedside.     Providence Lanius, MD     WJY/MEDQ  D:  12/03/2013  T:  12/04/2013  Job:  [id number]

## 2014-03-20 ENCOUNTER — Encounter: Payer: Self-pay | Admitting: Internal Medicine
